# Patient Record
Sex: Female | Born: 1950 | Race: Black or African American | Hispanic: No | Marital: Married | Smoking: Never smoker
Health system: Southern US, Community
[De-identification: ages and names within clinical notes are randomized; demographics above are authoritative.]

## PROBLEM LIST (undated history)

## (undated) DIAGNOSIS — E669 Obesity, unspecified: Secondary | ICD-10-CM

## (undated) DIAGNOSIS — I10 Essential (primary) hypertension: Secondary | ICD-10-CM

## (undated) DIAGNOSIS — R928 Other abnormal and inconclusive findings on diagnostic imaging of breast: Secondary | ICD-10-CM

## (undated) DIAGNOSIS — R202 Paresthesia of skin: Secondary | ICD-10-CM

## (undated) DIAGNOSIS — Z9889 Other specified postprocedural states: Secondary | ICD-10-CM

## (undated) DIAGNOSIS — E559 Vitamin D deficiency, unspecified: Secondary | ICD-10-CM

## (undated) DIAGNOSIS — M25512 Pain in left shoulder: Secondary | ICD-10-CM

## (undated) DIAGNOSIS — R768 Other specified abnormal immunological findings in serum: Secondary | ICD-10-CM

## (undated) DIAGNOSIS — E785 Hyperlipidemia, unspecified: Secondary | ICD-10-CM

## (undated) DIAGNOSIS — T753XXA Motion sickness, initial encounter: Secondary | ICD-10-CM

## (undated) DIAGNOSIS — N182 Chronic kidney disease, stage 2 (mild): Secondary | ICD-10-CM

## (undated) DIAGNOSIS — Z5181 Encounter for therapeutic drug level monitoring: Secondary | ICD-10-CM

## (undated) DIAGNOSIS — Z0000 Encounter for general adult medical examination without abnormal findings: Secondary | ICD-10-CM

## (undated) DIAGNOSIS — G8929 Other chronic pain: Secondary | ICD-10-CM

## (undated) DIAGNOSIS — C50919 Malignant neoplasm of unspecified site of unspecified female breast: Secondary | ICD-10-CM

## (undated) DIAGNOSIS — M2550 Pain in unspecified joint: Secondary | ICD-10-CM

## (undated) DIAGNOSIS — Z833 Family history of diabetes mellitus: Secondary | ICD-10-CM

## (undated) DIAGNOSIS — Z923 Personal history of irradiation: Secondary | ICD-10-CM

## (undated) DIAGNOSIS — D0510 Intraductal carcinoma in situ of unspecified breast: Secondary | ICD-10-CM

## (undated) DIAGNOSIS — R7303 Prediabetes: Secondary | ICD-10-CM

## (undated) DIAGNOSIS — M1990 Unspecified osteoarthritis, unspecified site: Secondary | ICD-10-CM

## (undated) DIAGNOSIS — R200 Anesthesia of skin: Secondary | ICD-10-CM

## (undated) DIAGNOSIS — Z1239 Encounter for other screening for malignant neoplasm of breast: Secondary | ICD-10-CM

## (undated) DIAGNOSIS — R112 Nausea with vomiting, unspecified: Secondary | ICD-10-CM

## (undated) HISTORY — DX: Prediabetes: R73.03

## (undated) HISTORY — DX: Malignant neoplasm of unspecified site of unspecified female breast: C50.919

## (undated) HISTORY — DX: Hyperlipidemia, unspecified: E78.5

## (undated) HISTORY — DX: Encounter for general adult medical examination without abnormal findings: Z00.00

## (undated) HISTORY — DX: Anesthesia of skin: R20.0

## (undated) HISTORY — DX: Other chronic pain: G89.29

## (undated) HISTORY — DX: Chronic kidney disease, stage 2 (mild): N18.2

## (undated) HISTORY — DX: Pain in left shoulder: M25.512

## (undated) HISTORY — DX: Other specified abnormal immunological findings in serum: R76.8

## (undated) HISTORY — PX: ABDOMINAL HYSTERECTOMY: SHX81

## (undated) HISTORY — DX: Paresthesia of skin: R20.2

## (undated) HISTORY — DX: Encounter for therapeutic drug level monitoring: Z51.81

## (undated) HISTORY — DX: Other abnormal and inconclusive findings on diagnostic imaging of breast: R92.8

## (undated) HISTORY — DX: Encounter for other screening for malignant neoplasm of breast: Z12.39

## (undated) HISTORY — DX: Intraductal carcinoma in situ of unspecified breast: D05.10

## (undated) HISTORY — DX: Essential (primary) hypertension: I10

## (undated) HISTORY — DX: Pain in unspecified joint: M25.50

## (undated) HISTORY — DX: Obesity, unspecified: E66.9

## (undated) HISTORY — DX: Other specified postprocedural states: Z98.890

## (undated) HISTORY — DX: Family history of diabetes mellitus: Z83.3

## (undated) HISTORY — DX: Vitamin D deficiency, unspecified: E55.9

---

## 2006-12-07 DIAGNOSIS — I10 Essential (primary) hypertension: Secondary | ICD-10-CM

## 2006-12-07 HISTORY — DX: Essential (primary) hypertension: I10

## 2007-02-16 ENCOUNTER — Ambulatory Visit: Payer: Self-pay | Admitting: Gastroenterology

## 2009-06-27 ENCOUNTER — Observation Stay: Payer: Self-pay | Admitting: Internal Medicine

## 2010-04-23 ENCOUNTER — Ambulatory Visit: Payer: Self-pay | Admitting: Family Medicine

## 2010-05-01 ENCOUNTER — Ambulatory Visit: Payer: Self-pay | Admitting: Family Medicine

## 2012-09-01 ENCOUNTER — Ambulatory Visit: Payer: Self-pay | Admitting: Family Medicine

## 2013-03-03 ENCOUNTER — Ambulatory Visit: Payer: Self-pay | Admitting: Family Medicine

## 2013-08-29 ENCOUNTER — Ambulatory Visit: Payer: Self-pay | Admitting: Family Medicine

## 2013-09-02 ENCOUNTER — Ambulatory Visit: Payer: Self-pay | Admitting: Family Medicine

## 2013-10-11 ENCOUNTER — Ambulatory Visit: Payer: Self-pay | Admitting: Family Medicine

## 2014-08-15 DIAGNOSIS — I10 Essential (primary) hypertension: Secondary | ICD-10-CM | POA: Diagnosis not present

## 2014-08-15 DIAGNOSIS — M25512 Pain in left shoulder: Secondary | ICD-10-CM | POA: Diagnosis not present

## 2014-08-28 DIAGNOSIS — M898X1 Other specified disorders of bone, shoulder: Secondary | ICD-10-CM | POA: Diagnosis not present

## 2014-08-28 DIAGNOSIS — R202 Paresthesia of skin: Secondary | ICD-10-CM | POA: Diagnosis not present

## 2014-11-07 DIAGNOSIS — R200 Anesthesia of skin: Secondary | ICD-10-CM

## 2014-11-07 DIAGNOSIS — G8929 Other chronic pain: Secondary | ICD-10-CM | POA: Diagnosis not present

## 2014-11-07 DIAGNOSIS — M25512 Pain in left shoulder: Secondary | ICD-10-CM | POA: Diagnosis not present

## 2014-11-07 DIAGNOSIS — R202 Paresthesia of skin: Secondary | ICD-10-CM

## 2014-11-07 HISTORY — DX: Paresthesia of skin: R20.2

## 2014-11-07 HISTORY — DX: Anesthesia of skin: R20.0

## 2014-11-08 DIAGNOSIS — G8929 Other chronic pain: Secondary | ICD-10-CM

## 2014-11-08 DIAGNOSIS — M25512 Pain in left shoulder: Secondary | ICD-10-CM

## 2014-11-08 DIAGNOSIS — M25519 Pain in unspecified shoulder: Secondary | ICD-10-CM | POA: Insufficient documentation

## 2014-11-08 HISTORY — DX: Other chronic pain: G89.29

## 2014-11-08 HISTORY — DX: Pain in left shoulder: M25.512

## 2014-11-22 DIAGNOSIS — R202 Paresthesia of skin: Secondary | ICD-10-CM | POA: Diagnosis not present

## 2014-11-22 DIAGNOSIS — M25512 Pain in left shoulder: Secondary | ICD-10-CM | POA: Diagnosis not present

## 2014-11-22 DIAGNOSIS — G8929 Other chronic pain: Secondary | ICD-10-CM | POA: Diagnosis not present

## 2015-02-19 ENCOUNTER — Ambulatory Visit (INDEPENDENT_AMBULATORY_CARE_PROVIDER_SITE_OTHER): Payer: Commercial Managed Care - HMO | Admitting: Family Medicine

## 2015-02-19 ENCOUNTER — Encounter: Payer: Self-pay | Admitting: Family Medicine

## 2015-02-19 VITALS — BP 148/86 | HR 85 | Temp 98.1°F | Resp 16 | Ht 62.0 in | Wt 192.8 lb

## 2015-02-19 DIAGNOSIS — T300 Burn of unspecified body region, unspecified degree: Secondary | ICD-10-CM | POA: Diagnosis not present

## 2015-02-19 DIAGNOSIS — I10 Essential (primary) hypertension: Secondary | ICD-10-CM

## 2015-02-19 DIAGNOSIS — M25511 Pain in right shoulder: Secondary | ICD-10-CM

## 2015-02-19 MED ORDER — MELOXICAM 15 MG PO TABS: 15.0000 mg | ORAL_TABLET | Freq: Every day | ORAL | Status: DC

## 2015-02-19 MED ORDER — SILVER SULFADIAZINE 1 % EX CREA: 1.0000 "application " | TOPICAL_CREAM | Freq: Every day | CUTANEOUS | Status: DC

## 2015-02-19 MED ORDER — LOSARTAN POTASSIUM-HCTZ 100-25 MG PO TABS: 1.0000 | ORAL_TABLET | Freq: Every day | ORAL | Status: DC

## 2015-02-19 NOTE — Progress Notes (Signed)
Name: Marissa Nelson   MRN: [id number]    DOB: 06-23-1950   Date:02/19/2015       Progress Note  Subjective  Chief Complaint  Chief Complaint  Patient presents with  . Hypertension    6 month follow up  . Burn    left arm from work    HPI  Hypertension   Patient presents for follow-up of hypertension. It has been present for over  5 years.  Patient states that there is compliance with medical regimen which consists of losartan HCT 100-25 once daily . There is no end organ disease. Cardiac risk factors include hypertension hyperlipidemia and diabetes.  Exercise regimen consist of working out at [employer] .  Diet consist of low sodium .  Burn  Patient suffered a burn of her left arm while at work today.  Right shoulder pain  Patient complains of pain and discomfort in the right shoulder. No history of any antecedent problem trauma. She does repetitive motions at work was done so with arm and shoulder for number of years.  Past Medical History  Diagnosis Date  . Hyperlipidemia   . Hypertension     Social History  Substance Use Topics  . Smoking status: Never Smoker   . Smokeless tobacco: Not on file  . Alcohol Use: No     Current outpatient prescriptions:  .  losartan-hydrochlorothiazide (HYZAAR) 100-25 MG tablet, Take 1 tablet by mouth daily., Disp: , Rfl:   Allergies  Allergen Reactions  . Penicillin G Rash    Review of Systems  Constitutional: Negative for fever, chills and weight loss.  HENT: Negative for congestion, hearing loss, sore throat and tinnitus.   Eyes: Negative for blurred vision, double vision and redness.  Respiratory: Negative for cough, hemoptysis and shortness of breath.   Cardiovascular: Negative for chest pain, palpitations, orthopnea, claudication and leg swelling.  Gastrointestinal: Negative for heartburn, nausea, vomiting, diarrhea, constipation and blood in stool.  Genitourinary: Negative for dysuria, urgency, frequency and  hematuria.  Musculoskeletal: Positive for joint pain (right shoulder). Negative for myalgias, back pain, falls and neck pain.  Skin: Negative for itching.  Neurological: Negative for dizziness, tingling, tremors, focal weakness, seizures, loss of consciousness, weakness and headaches.  Endo/Heme/Allergies: Does not bruise/bleed easily.  Psychiatric/Behavioral: Negative for depression and substance abuse. The patient is not nervous/anxious and does not have insomnia.      Objective  Filed Vitals:   02/19/15 1455  BP: 148/86  Pulse: 85  Temp: 98.1 F (36.7 C)  TempSrc: Oral  Resp: 16  Height: 5\' 2"  (1.575 m)  Weight: 192 lb 12.8 oz (87.454 kg)  SpO2: 99%     Physical Exam  Constitutional: She is oriented to person, place, and time.  Obese in no acute distress  HENT:  Head: Normocephalic.  Eyes: EOM are normal. Pupils are equal, round, and reactive to light.  Neck: Normal range of motion. No thyromegaly present.  Cardiovascular: Normal rate, regular rhythm and normal heart sounds.   No murmur heard. Pulmonary/Chest: Effort normal and breath sounds normal.  Abdominal: Soft. Bowel sounds are normal.  Musculoskeletal: She exhibits no edema.  Tenderness to palpation over the right shoulder. Abduction is limited to about 80 before discomfort  Neurological: She is alert and oriented to person, place, and time. No cranial nerve deficit. Gait normal.  Skin: No rash noted.  There is a second-degree burn on the left forearm measuring about 1 cm x 6 cm  Psychiatric: Memory and affect  normal.      Assessment & Plan  1. Burn Second-degree - silver sulfADIAZINE (SILVADENE) 1 % cream; Apply 1 application topically daily.  Dispense: 50 g; Refill: 0  2. Right shoulder pain Probable mild rotator cuff syndrome. She is encouraged to do all TO increase range of motion. Consider x-ray and orthopedic evaluation - meloxicam (MOBIC) 15 MG tablet; Take 1 tablet (15 mg total) by mouth daily.   Dispense: 30 tablet; Refill: 0  3. Essential hypertension At goal below 150/90 - Comprehensive Metabolic Panel (CMET) - Lipid panel - TSH

## 2015-02-21 DIAGNOSIS — I10 Essential (primary) hypertension: Secondary | ICD-10-CM | POA: Diagnosis not present

## 2015-02-22 LAB — LIPID PANEL
CHOLESTEROL TOTAL: 214 mg/dL — AB (ref 100–199)
Chol/HDL Ratio: 3.1 ratio units (ref 0.0–4.4)
HDL: 69 mg/dL (ref >39)
LDL Calculated: 132 mg/dL — ABNORMAL HIGH (ref 0–99)
Triglycerides: 67 mg/dL (ref 0–149)
VLDL Cholesterol Cal: 13 mg/dL (ref 5–40)

## 2015-02-22 LAB — COMPREHENSIVE METABOLIC PANEL
A/G RATIO: 1.5 (ref 1.1–2.5)
ALK PHOS: 97 IU/L (ref 39–117)
ALT: 16 IU/L (ref 0–32)
AST: 17 IU/L (ref 0–40)
Albumin: 4.3 g/dL (ref 3.6–4.8)
BUN / CREAT RATIO: 16 (ref 11–26)
BUN: 16 mg/dL (ref 8–27)
Bilirubin Total: 0.3 mg/dL (ref 0.0–1.2)
CO2: 28 mmol/L (ref 18–29)
CREATININE: 0.99 mg/dL (ref 0.57–1.00)
Calcium: 9.9 mg/dL (ref 8.7–10.3)
Chloride: 98 mmol/L (ref 97–106)
GFR calc Af Amer: 69 mL/min/{1.73_m2} (ref >59)
GFR calc non Af Amer: 60 mL/min/{1.73_m2} (ref >59)
GLOBULIN, TOTAL: 2.9 g/dL (ref 1.5–4.5)
Glucose: 89 mg/dL (ref 65–99)
POTASSIUM: 4.9 mmol/L (ref 3.5–5.2)
SODIUM: 140 mmol/L (ref 136–144)
Total Protein: 7.2 g/dL (ref 6.0–8.5)

## 2015-02-22 LAB — TSH: TSH: 2 u[IU]/mL (ref 0.450–4.500)

## 2015-02-27 ENCOUNTER — Telehealth: Payer: Self-pay | Admitting: Emergency Medicine

## 2015-02-27 NOTE — Telephone Encounter (Signed)
Patient notified of labs.   

## 2015-06-11 DIAGNOSIS — J069 Acute upper respiratory infection, unspecified: Secondary | ICD-10-CM | POA: Diagnosis not present

## 2015-06-11 DIAGNOSIS — J208 Acute bronchitis due to other specified organisms: Secondary | ICD-10-CM | POA: Diagnosis not present

## 2015-06-15 ENCOUNTER — Ambulatory Visit (INDEPENDENT_AMBULATORY_CARE_PROVIDER_SITE_OTHER): Payer: Commercial Managed Care - HMO | Admitting: Family Medicine

## 2015-06-15 ENCOUNTER — Encounter: Payer: Self-pay | Admitting: Family Medicine

## 2015-06-15 ENCOUNTER — Encounter: Payer: Commercial Managed Care - HMO | Admitting: Family Medicine

## 2015-06-15 VITALS — BP 122/74 | HR 101 | Temp 98.3°F | Resp 18 | Wt 191.9 lb

## 2015-06-15 DIAGNOSIS — Z0000 Encounter for general adult medical examination without abnormal findings: Secondary | ICD-10-CM | POA: Insufficient documentation

## 2015-06-15 DIAGNOSIS — J40 Bronchitis, not specified as acute or chronic: Secondary | ICD-10-CM | POA: Insufficient documentation

## 2015-06-15 DIAGNOSIS — E669 Obesity, unspecified: Secondary | ICD-10-CM | POA: Insufficient documentation

## 2015-06-15 DIAGNOSIS — R42 Dizziness and giddiness: Secondary | ICD-10-CM | POA: Insufficient documentation

## 2015-06-15 DIAGNOSIS — E66812 Obesity, class 2: Secondary | ICD-10-CM | POA: Insufficient documentation

## 2015-06-15 DIAGNOSIS — IMO0002 Reserved for concepts with insufficient information to code with codable children: Secondary | ICD-10-CM | POA: Insufficient documentation

## 2015-06-15 DIAGNOSIS — E785 Hyperlipidemia, unspecified: Secondary | ICD-10-CM | POA: Insufficient documentation

## 2015-06-15 DIAGNOSIS — R0989 Other specified symptoms and signs involving the circulatory and respiratory systems: Secondary | ICD-10-CM | POA: Insufficient documentation

## 2015-06-15 DIAGNOSIS — M79673 Pain in unspecified foot: Secondary | ICD-10-CM | POA: Insufficient documentation

## 2015-06-15 DIAGNOSIS — H6120 Impacted cerumen, unspecified ear: Secondary | ICD-10-CM | POA: Insufficient documentation

## 2015-06-15 DIAGNOSIS — G4733 Obstructive sleep apnea (adult) (pediatric): Secondary | ICD-10-CM | POA: Insufficient documentation

## 2015-06-15 HISTORY — DX: Hyperlipidemia, unspecified: E78.5

## 2015-06-15 MED ORDER — PREDNISONE 20 MG PO TABS: 20.0000 mg | ORAL_TABLET | Freq: Two times a day (BID) | ORAL | Status: DC

## 2015-06-15 MED ORDER — BENZONATATE 100 MG PO CAPS: 100.0000 mg | ORAL_CAPSULE | Freq: Two times a day (BID) | ORAL | Status: DC | PRN

## 2015-06-15 MED ORDER — FLUTICASONE FUROATE-VILANTEROL 100-25 MCG/INH IN AEPB: 1.0000 | INHALATION_SPRAY | Freq: Every day | RESPIRATORY_TRACT | Status: DC

## 2015-06-15 NOTE — Progress Notes (Signed)
Name: Marissa Nelson   MRN: [id number]    DOB: June 15, 1950   Date:06/15/2015       Progress Note  Subjective  Chief Complaint  Chief Complaint  Patient presents with  . Bronchitis    started last sunday with a sore throat. patient was recently seen at [facility] and was given Doxycycline and a cough syrup that made her hallucinate.    HPI  Bronchitis: she states she developed cold symptoms two weeks ago, but over the past week symptoms are worse. She has dry cough, associated with wheezing and some SOB with activity. She has no fever or chills. She still has some clear rhinorrhea and nasal congestion. She has lack of appetite but has been able to eat soup and fluids. She went to Urgent Care and was given Doxycicline and cough syrup but got nauseated and hallucinations and stopped medications. She has missed work for the past few days.   Patient Active Problem List   Diagnosis Date Noted  . Bronchitis 06/15/2015  . Ceruminosis 06/15/2015  . Foot pain 06/15/2015  . Dizziness 06/15/2015  . HLD (hyperlipidemia) 06/15/2015  . Adult BMI 30+ 06/15/2015  . Obstructive apnea 06/15/2015  . Benign essential HTN 12/07/2006    Past Surgical History  Procedure Laterality Date  . No past surgeries      History reviewed. No pertinent family history.  Social History   Social History  . Marital Status: Married    Spouse Name: N/A  . Number of Children: N/A  . Years of Education: N/A   Occupational History  . Not on file.   Social History Main Topics  . Smoking status: Never Smoker   . Smokeless tobacco: Not on file  . Alcohol Use: No  . Drug Use: No  . Sexual Activity: No   Other Topics Concern  . Not on file   Social History Narrative     Current outpatient prescriptions:  .  aspirin EC 81 MG tablet, Take 81 mg by mouth daily., Disp: , Rfl:  .  doxycycline (VIBRA-TABS) 100 MG tablet, Take by mouth., Disp: , Rfl:  .  losartan-hydrochlorothiazide (HYZAAR) 100-25 MG  tablet, Take 1 tablet by mouth daily., Disp: 30 tablet, Rfl: 5 .  benzonatate (TESSALON) 100 MG capsule, Take 1-2 capsules (100-200 mg total) by mouth 2 (two) times daily as needed., Disp: 40 capsule, Rfl: 0 .  Fluticasone Furoate-Vilanterol (BREO ELLIPTA) 100-25 MCG/INH AEPB, Inhale 1 puff into the lungs daily., Disp: 60 each, Rfl: 0 .  predniSONE (DELTASONE) 20 MG tablet, Take 1 tablet (20 mg total) by mouth 2 (two) times daily with a meal., Disp: 10 tablet, Rfl: 0  Allergies  Allergen Reactions  . Penicillin G Rash     ROS  Ten systems reviewed and is negative except as mentioned in HPI   Objective  Filed Vitals:   06/15/15 0934  BP: 122/74  Pulse: 101  Temp: 98.3 F (36.8 C)  TempSrc: Oral  Resp: 18  Weight: 191 lb 14.4 oz (87.045 kg)  SpO2: 99%    Body mass index is 35.09 kg/(m^2).  Physical Exam  Constitutional: Patient appears well-developed and well-nourished. ObeseNo distress.  HEENT: head atraumatic, normocephalic, pupils equal and reactive to light, ears TM normal, neck supple, throat within normal limits Cardiovascular: Normal rate, regular rhythm and normal heart sounds.  No murmur heard. No BLE edema. Pulmonary/Chest: Effort normal and breath sounds normal. No respiratory distress. Abdominal: Soft.  There is no tenderness. Psychiatric: Patient  has a normal mood and affect. behavior is normal. Judgment and thought content normal.   PHQ2/9: Depression screen [facility] 2/9 06/15/2015 02/19/2015  Decreased Interest 0 0  Down, Depressed, Hopeless 0 0  PHQ - 2 Score 0 0     Fall Risk: Fall Risk  06/15/2015 02/19/2015  Falls in the past year? No No     Functional Status Survey: Is the patient deaf or have difficulty hearing?: No Does the patient have difficulty seeing, even when wearing glasses/contacts?: No Does the patient have difficulty concentrating, remembering, or making decisions?: No Does the patient have difficulty walking or climbing stairs?:  No Does the patient have difficulty dressing or bathing?: No Does the patient have difficulty doing errands alone such as visiting a doctor's office or shopping?: No    Assessment & Plan  1. Bronchitis  May stop Doxycicline and try prednisone , benzonate , Breo and continue fluids.  - predniSONE (DELTASONE) 20 MG tablet; Take 1 tablet (20 mg total) by mouth 2 (two) times daily with a meal.  Dispense: 10 tablet; Refill: 0 - Fluticasone Furoate-Vilanterol (BREO ELLIPTA) 100-25 MCG/INH AEPB; Inhale 1 puff into the lungs daily.  Dispense: 60 each; Refill: 0 - benzonatate (TESSALON) 100 MG capsule; Take 1-2 capsules (100-200 mg total) by mouth 2 (two) times daily as needed.  Dispense: 40 capsule; Refill: 0

## 2015-07-23 ENCOUNTER — Encounter: Payer: Commercial Managed Care - HMO | Admitting: Family Medicine

## 2015-09-03 ENCOUNTER — Encounter: Payer: Commercial Managed Care - HMO | Admitting: Family Medicine

## 2015-10-16 ENCOUNTER — Encounter: Payer: Commercial Managed Care - HMO | Admitting: Family Medicine

## 2015-10-25 ENCOUNTER — Encounter: Payer: Self-pay | Admitting: Family Medicine

## 2015-10-25 ENCOUNTER — Ambulatory Visit (INDEPENDENT_AMBULATORY_CARE_PROVIDER_SITE_OTHER): Payer: Commercial Managed Care - HMO | Admitting: Family Medicine

## 2015-10-25 VITALS — BP 122/84 | HR 82 | Temp 98.4°F | Resp 18 | Wt 194.4 lb

## 2015-10-25 DIAGNOSIS — E669 Obesity, unspecified: Secondary | ICD-10-CM | POA: Diagnosis not present

## 2015-10-25 DIAGNOSIS — E559 Vitamin D deficiency, unspecified: Secondary | ICD-10-CM

## 2015-10-25 DIAGNOSIS — E66811 Obesity, class 1: Secondary | ICD-10-CM

## 2015-10-25 DIAGNOSIS — I10 Essential (primary) hypertension: Secondary | ICD-10-CM | POA: Diagnosis not present

## 2015-10-25 HISTORY — DX: Obesity, unspecified: E66.9

## 2015-10-25 HISTORY — DX: Obesity, class 1: E66.811

## 2015-10-25 LAB — BASIC METABOLIC PANEL
BUN: 15 mg/dL (ref 7–25)
CO2: 28 mmol/L (ref 20–31)
Calcium: 9.6 mg/dL (ref 8.6–10.4)
Chloride: 105 mmol/L (ref 98–110)
Creat: 1.17 mg/dL — ABNORMAL HIGH (ref 0.50–0.99)
Glucose, Bld: 86 mg/dL (ref 65–99)
POTASSIUM: 4.9 mmol/L (ref 3.5–5.3)
SODIUM: 139 mmol/L (ref 135–146)

## 2015-10-25 MED ORDER — LOSARTAN POTASSIUM-HCTZ 100-25 MG PO TABS: 1.0000 | ORAL_TABLET | Freq: Every day | ORAL | Status: DC

## 2015-10-26 LAB — VITAMIN D 25 HYDROXY (VIT D DEFICIENCY, FRACTURES): VIT D 25 HYDROXY: 11 ng/mL — AB (ref 30–100)

## 2015-10-27 NOTE — Progress Notes (Addendum)
Date:  10/25/2015   Name:  Marissa Nelson   DOB:  1951/02/13   MRN:  [id number]  PCP:  Ashok Norris, MD    Chief Complaint: Medication Refill   History of Present Illness:  This is a 65 y.o. female seen for four month f/u. On Hyzaar for HTN and asa for stroke risk. Bronchitis sxs from Feb visit have resolved. No acute concerns.  Review of Systems:  Review of Systems  Constitutional: Negative for fever and fatigue.  Respiratory: Negative for cough and shortness of breath.   Cardiovascular: Negative for chest pain and leg swelling.  Neurological: Negative for syncope and light-headedness.    Patient Active Problem List   Diagnosis Date Noted  . Obesity, Class II, BMI 35-39.9 10/25/2015  . Bronchitis 06/15/2015  . Ceruminosis 06/15/2015  . Foot pain 06/15/2015  . Dizziness 06/15/2015  . HLD (hyperlipidemia) 06/15/2015  . Adult BMI 30+ 06/15/2015  . Obstructive apnea 06/15/2015  . Benign essential HTN 12/07/2006    Prior to Admission medications   Medication Sig Start Date End Date Taking? Authorizing Provider  aspirin EC 81 MG tablet Take 81 mg by mouth daily.    Historical Provider, MD  losartan-hydrochlorothiazide (HYZAAR) 100-25 MG tablet Take 1 tablet by mouth daily. 10/25/15   Adline Potter, MD    Allergies  Allergen Reactions  . Penicillin G Rash    Past Surgical History  Procedure Laterality Date  . No past surgeries      Social History  Substance Use Topics  . Smoking status: Never Smoker   . Smokeless tobacco: None  . Alcohol Use: No    No family history on file.  Medication list has been reviewed and updated.  Physical Examination: BP 122/84 mmHg  Pulse 82  Temp(Src) 98.4 F (36.9 C)  Resp 18  Wt 194 lb 7 oz (88.196 kg)  SpO2 97%  Physical Exam  Constitutional: She appears well-developed and well-nourished.  Cardiovascular: Normal rate, regular rhythm and normal heart sounds.   Pulmonary/Chest: Effort normal and breath sounds  normal.  Musculoskeletal: She exhibits no edema.  Neurological: She is alert.  Skin: Skin is warm and dry.  Psychiatric: She has a normal mood and affect. Her behavior is normal.  Nursing note and vitals reviewed.   Assessment and Plan:  1. Benign essential HTN Well controlled on Hyzaar - Basic Metabolic Panel (BMET)  2. Obesity, Class II, BMI 35-39.9 Exercise/weight loss discussed - Vitamin D (25 hydroxy)  3. Med review Unclear indication for asa use (10 yr CVR 7.2%), consider d/c next visit  Return in about 6 months (around 04/25/2016).  Satira Anis. [facility]  10/27/2015

## 2015-10-29 DIAGNOSIS — E559 Vitamin D deficiency, unspecified: Secondary | ICD-10-CM

## 2015-10-29 HISTORY — DX: Vitamin D deficiency, unspecified: E55.9

## 2015-10-29 MED ORDER — VITAMIN D 50 MCG (2000 UT) PO CAPS: 1.0000 | ORAL_CAPSULE | Freq: Every day | ORAL | Status: DC

## 2015-10-29 NOTE — Addendum Note (Signed)
Addended by: Adline Potter on: 10/29/2015 08:21 AM   Modules accepted: Orders, Medications

## 2015-11-15 ENCOUNTER — Ambulatory Visit (INDEPENDENT_AMBULATORY_CARE_PROVIDER_SITE_OTHER): Payer: BC Managed Care – PPO | Admitting: Family Medicine

## 2015-11-15 ENCOUNTER — Encounter: Payer: Self-pay | Admitting: Family Medicine

## 2015-11-15 VITALS — BP 120/84 | HR 68 | Temp 98.8°F | Resp 18 | Ht 62.0 in | Wt 196.4 lb

## 2015-11-15 DIAGNOSIS — E559 Vitamin D deficiency, unspecified: Secondary | ICD-10-CM | POA: Diagnosis not present

## 2015-11-15 DIAGNOSIS — Z5181 Encounter for therapeutic drug level monitoring: Secondary | ICD-10-CM | POA: Diagnosis not present

## 2015-11-15 DIAGNOSIS — N182 Chronic kidney disease, stage 2 (mild): Secondary | ICD-10-CM

## 2015-11-15 DIAGNOSIS — N183 Chronic kidney disease, stage 3 unspecified: Secondary | ICD-10-CM

## 2015-11-15 DIAGNOSIS — Z1239 Encounter for other screening for malignant neoplasm of breast: Secondary | ICD-10-CM | POA: Insufficient documentation

## 2015-11-15 DIAGNOSIS — N1832 Chronic kidney disease, stage 3b: Secondary | ICD-10-CM | POA: Insufficient documentation

## 2015-11-15 DIAGNOSIS — E785 Hyperlipidemia, unspecified: Secondary | ICD-10-CM | POA: Diagnosis not present

## 2015-11-15 DIAGNOSIS — Z0000 Encounter for general adult medical examination without abnormal findings: Secondary | ICD-10-CM | POA: Insufficient documentation

## 2015-11-15 DIAGNOSIS — Z23 Encounter for immunization: Secondary | ICD-10-CM | POA: Diagnosis not present

## 2015-11-15 DIAGNOSIS — Z1159 Encounter for screening for other viral diseases: Secondary | ICD-10-CM | POA: Diagnosis not present

## 2015-11-15 DIAGNOSIS — E669 Obesity, unspecified: Secondary | ICD-10-CM | POA: Diagnosis not present

## 2015-11-15 DIAGNOSIS — Z780 Asymptomatic menopausal state: Secondary | ICD-10-CM | POA: Diagnosis not present

## 2015-11-15 HISTORY — DX: Chronic kidney disease, stage 2 (mild): N18.2

## 2015-11-15 HISTORY — DX: Encounter for therapeutic drug level monitoring: Z51.81

## 2015-11-15 HISTORY — DX: Encounter for general adult medical examination without abnormal findings: Z00.00

## 2015-11-15 HISTORY — DX: Chronic kidney disease, stage 3 unspecified: N18.30

## 2015-11-15 HISTORY — DX: Encounter for other screening for malignant neoplasm of breast: Z12.39

## 2015-11-15 LAB — COMPLETE METABOLIC PANEL WITH GFR
ALBUMIN: 4.1 g/dL (ref 3.6–5.1)
ALK PHOS: 88 U/L (ref 33–130)
ALT: 20 U/L (ref 6–29)
AST: 16 U/L (ref 10–35)
BILIRUBIN TOTAL: 0.3 mg/dL (ref 0.2–1.2)
BUN: 14 mg/dL (ref 7–25)
CALCIUM: 9.7 mg/dL (ref 8.6–10.4)
CO2: 29 mmol/L (ref 20–31)
Chloride: 103 mmol/L (ref 98–110)
Creat: 1.09 mg/dL — ABNORMAL HIGH (ref 0.50–0.99)
GFR, EST NON AFRICAN AMERICAN: 53 mL/min — AB (ref >=60)
GFR, Est African American: 61 mL/min (ref >=60)
GLUCOSE: 98 mg/dL (ref 65–99)
Potassium: 4.8 mmol/L (ref 3.5–5.3)
SODIUM: 140 mmol/L (ref 135–146)
TOTAL PROTEIN: 7 g/dL (ref 6.1–8.1)

## 2015-11-15 LAB — LIPID PANEL
CHOLESTEROL: 194 mg/dL (ref 125–200)
HDL: 64 mg/dL (ref >=46)
LDL Cholesterol: 113 mg/dL (ref <130)
Total CHOL/HDL Ratio: 3 Ratio (ref <=5.0)
Triglycerides: 86 mg/dL (ref <150)
VLDL: 17 mg/dL (ref <30)

## 2015-11-15 NOTE — Assessment & Plan Note (Signed)
Start Rx for just two months, then 2,000 iu daily OTC for the rest of the year, then 1,000 iu daily

## 2015-11-15 NOTE — Patient Instructions (Addendum)
Check out the information at familydoctor.org entitled "Nutrition for Weight Loss: What You Need to Know about Fad Diets" Try to lose between 1-2 pounds per week by taking in fewer calories and burning off more calories You can succeed by limiting portions, limiting foods dense in calories and fat, becoming more active, and drinking 8 glasses of water a day (64 ounces) Don't skip meals, especially breakfast, as skipping meals may alter your metabolism Do not use over-the-counter weight loss pills or gimmicks that claim rapid weight loss A healthy BMI (or body mass index) is between 18.5 and 24.9 You can calculate your ideal BMI at the NIH website ClubMonetize.fr  Take a prescription vitamin D for two months, then 2,000 iu daily for the rest of the year, then just 1,000 iu daily starting in 2018 to keep your levels adequate  Please do call to schedule your mammogram and bone density test; the number to schedule one at either [facility] or [facility] is [phone]  If you need something for aches or pains, try to use Tylenol (acetaminophen) instead of non-steroidals (which include Aleve, ibuprofen, Advil, Motrin, and naproxen); non-steroidals can cause long-term kidney damage  Try to limit saturated fats in your diet (bologna, hot dogs, barbeque, cheeseburgers, hamburgers, steak, bacon, sausage, cheese, etc.) and get more fresh fruits, vegetables, and whole grains  Health Maintenance, Female Adopting a healthy lifestyle and getting preventive care can go a long way to promote health and wellness. Talk with your health care provider about what schedule of regular examinations is right for you. This is a good chance for you to check in with your provider about disease prevention and staying healthy. In between checkups, there are plenty of things you can do on your own. Experts have done a lot of research about which  lifestyle changes and preventive measures are most likely to keep you healthy. Ask your health care provider for more information. WEIGHT AND DIET  Eat a healthy diet  Be sure to include plenty of vegetables, fruits, low-fat dairy products, and lean protein.  Do not eat a lot of foods high in solid fats, added sugars, or salt.  Get regular exercise. This is one of the most important things you can do for your health.  Most adults should exercise for at least 150 minutes each week. The exercise should increase your heart rate and make you sweat (moderate-intensity exercise).  Most adults should also do strengthening exercises at least twice a week. This is in addition to the moderate-intensity exercise.  Maintain a healthy weight  Body mass index (BMI) is a measurement that can be used to identify possible weight problems. It estimates body fat based on height and weight. Your health care provider can help determine your BMI and help you achieve or maintain a healthy weight.  For females 53 years of age and older:   A BMI below 18.5 is considered underweight.  A BMI of 18.5 to 24.9 is normal.  A BMI of 25 to 29.9 is considered overweight.  A BMI of 30 and above is considered obese.  Watch levels of cholesterol and blood lipids  You should start having your blood tested for lipids and cholesterol at 67 years of age, then have this test every 5 years.  You may need to have your cholesterol levels checked more often if:  Your lipid or cholesterol levels are high.  You are older than 67 years of age.  You are at high  risk for heart disease.  CANCER SCREENING   Lung Cancer  Lung cancer screening is recommended for adults 34-24 years old who are at high risk for lung cancer because of a history of smoking.  A yearly low-dose CT scan of the lungs is recommended for people who:  Currently smoke.  Have quit within the past 15 years.  Have at least a 30-pack-year history of  smoking. A pack year is smoking an average of one pack of cigarettes a day for 1 year.  Yearly screening should continue until it has been 15 years since you quit.  Yearly screening should stop if you develop a health problem that would prevent you from having lung cancer treatment.  Breast Cancer  Practice breast self-awareness. This means understanding how your breasts normally appear and feel.  It also means doing regular breast self-exams. Let your health care provider know about any changes, no matter how small.  If you are in your 20s or 30s, you should have a clinical breast exam (CBE) by a health care provider every 1-3 years as part of a regular health exam.  If you are 42 or older, have a CBE every year. Also consider having a breast X-ray (mammogram) every year.  If you have a family history of breast cancer, talk to your health care provider about genetic screening.  If you are at high risk for breast cancer, talk to your health care provider about having an MRI and a mammogram every year.  Breast cancer gene (BRCA) assessment is recommended for women who have family members with BRCA-related cancers. BRCA-related cancers include:  Breast.  Ovarian.  Tubal.  Peritoneal cancers.  Results of the assessment will determine the need for genetic counseling and BRCA1 and BRCA2 testing. Cervical Cancer Your health care provider may recommend that you be screened regularly for cancer of the pelvic organs (ovaries, uterus, and vagina). This screening involves a pelvic examination, including checking for microscopic changes to the surface of your cervix (Pap test). You may be encouraged to have this screening done every 3 years, beginning at age 42.  For women ages 63-65, health care providers may recommend pelvic exams and Pap testing every 3 years, or they may recommend the Pap and pelvic exam, combined with testing for human papilloma virus (HPV), every 5 years. Some types of  HPV increase your risk of cervical cancer. Testing for HPV may also be done on women of any age with unclear Pap test results.  Other health care providers may not recommend any screening for nonpregnant women who are considered low risk for pelvic cancer and who do not have symptoms. Ask your health care provider if a screening pelvic exam is right for you.  If you have had past treatment for cervical cancer or a condition that could lead to cancer, you need Pap tests and screening for cancer for at least 20 years after your treatment. If Pap tests have been discontinued, your risk factors (such as having a new sexual partner) need to be reassessed to determine if screening should resume. Some women have medical problems that increase the chance of getting cervical cancer. In these cases, your health care provider may recommend more frequent screening and Pap tests. Colorectal Cancer  This type of cancer can be detected and often prevented.  Routine colorectal cancer screening usually begins at 67 years of age and continues through 67 years of age.  Your health care provider may recommend screening at an earlier age  if you have risk factors for colon cancer.  Your health care provider may also recommend using home test kits to check for hidden blood in the stool.  A small camera at the end of a tube can be used to examine your colon directly (sigmoidoscopy or colonoscopy). This is done to check for the earliest forms of colorectal cancer.  Routine screening usually begins at age 61.  Direct examination of the colon should be repeated every 5-10 years through 67 years of age. However, you may need to be screened more often if early forms of precancerous polyps or small growths are found. Skin Cancer  Check your skin from head to toe regularly.  Tell your health care provider about any new moles or changes in moles, especially if there is a change in a mole's shape or color.  Also tell your  health care provider if you have a mole that is larger than the size of a pencil eraser.  Always use sunscreen. Apply sunscreen liberally and repeatedly throughout the day.  Protect yourself by wearing long sleeves, pants, a wide-brimmed hat, and sunglasses whenever you are outside. HEART DISEASE, DIABETES, AND HIGH BLOOD PRESSURE   High blood pressure causes heart disease and increases the risk of stroke. High blood pressure is more likely to develop in:  People who have blood pressure in the high end of the normal range (130-139/85-89 mm Hg).  People who are overweight or obese.  People who are African American.  If you are 82-65 years of age, have your blood pressure checked every 3-5 years. If you are 19 years of age or older, have your blood pressure checked every year. You should have your blood pressure measured twice--once when you are at a hospital or clinic, and once when you are not at a hospital or clinic. Record the average of the two measurements. To check your blood pressure when you are not at a hospital or clinic, you can use:  An automated blood pressure machine at a pharmacy.  A home blood pressure monitor.  If you are between 62 years and 6 years old, ask your health care provider if you should take aspirin to prevent strokes.  Have regular diabetes screenings. This involves taking a blood sample to check your fasting blood sugar level.  If you are at a normal weight and have a low risk for diabetes, have this test once every three years after 67 years of age.  If you are overweight and have a high risk for diabetes, consider being tested at a younger age or more often. PREVENTING INFECTION  Hepatitis B  If you have a higher risk for hepatitis B, you should be screened for this virus. You are considered at high risk for hepatitis B if:  You were born in a country where hepatitis B is common. Ask your health care provider which countries are considered high  risk.  Your parents were born in a high-risk country, and you have not been immunized against hepatitis B (hepatitis B vaccine).  You have HIV or AIDS.  You use needles to inject street drugs.  You live with someone who has hepatitis B.  You have had sex with someone who has hepatitis B.  You get hemodialysis treatment.  You take certain medicines for conditions, including cancer, organ transplantation, and autoimmune conditions. Hepatitis C  Blood testing is recommended for:  Everyone born from [birth year] through 1967.  Anyone with known risk factors for hepatitis C. Sexually transmitted  infections (STIs)  You should be screened for sexually transmitted infections (STIs) including gonorrhea and chlamydia if:  You are sexually active and are younger than 67 years of age.  You are older than 68 years of age and your health care provider tells you that you are at risk for this type of infection.  Your sexual activity has changed since you were last screened and you are at an increased risk for chlamydia or gonorrhea. Ask your health care provider if you are at risk.  If you do not have HIV, but are at risk, it may be recommended that you take a prescription medicine daily to prevent HIV infection. This is called pre-exposure prophylaxis (PrEP). You are considered at risk if:  You are sexually active and do not regularly use condoms or know the HIV status of your partner(s).  You take drugs by injection.  You are sexually active with a partner who has HIV. Talk with your health care provider about whether you are at high risk of being infected with HIV. If you choose to begin PrEP, you should first be tested for HIV. You should then be tested every 3 months for as long as you are taking PrEP.  PREGNANCY   If you are premenopausal and you may become pregnant, ask your health care provider about preconception counseling.  If you may become pregnant, take 400 to 800 micrograms (mcg)  of folic acid every day.  If you want to prevent pregnancy, talk to your health care provider about birth control (contraception). OSTEOPOROSIS AND MENOPAUSE   Osteoporosis is a disease in which the bones lose minerals and strength with aging. This can result in serious bone fractures. Your risk for osteoporosis can be identified using a bone density scan.  If you are 34 years of age or older, or if you are at risk for osteoporosis and fractures, ask your health care provider if you should be screened.  Ask your health care provider whether you should take a calcium or vitamin D supplement to lower your risk for osteoporosis.  Menopause may have certain physical symptoms and risks.  Hormone replacement therapy may reduce some of these symptoms and risks. Talk to your health care provider about whether hormone replacement therapy is right for you.  HOME CARE INSTRUCTIONS   Schedule regular health, dental, and eye exams.  Stay current with your immunizations.   Do not use any tobacco products including cigarettes, chewing tobacco, or electronic cigarettes.  If you are pregnant, do not drink alcohol.  If you are breastfeeding, limit how much and how often you drink alcohol.  Limit alcohol intake to no more than 1 drink per day for nonpregnant women. One drink equals 12 ounces of beer, 5 ounces of wine, or 1 ounces of hard liquor.  Do not use street drugs.  Do not share needles.  Ask your health care provider for help if you need support or information about quitting drugs.  Tell your health care provider if you often feel depressed.  Tell your health care provider if you have ever been abused or do not feel safe at home.   This information is not intended to replace advice given to you by your health care provider. Make sure you discuss any questions you have with your health care provider.   Document Released: 11/04/2010 Document Revised: 05/12/2014 Document Reviewed:  03/23/2013 Elsevier Interactive Patient Education [employer].

## 2015-11-15 NOTE — Assessment & Plan Note (Signed)
Due for PCV-13 today; she can get the PPSV-23 next year

## 2015-11-15 NOTE — Assessment & Plan Note (Signed)
Check today; limit saturated fats

## 2015-11-15 NOTE — Progress Notes (Signed)
Patient ID: Marissa Nelson, female   DOB: 11-15-1950, 65 y.o.   MRN: [id number]   Subjective:   Marissa Nelson is a 65 y.o. female here for a complete physical exam  Interim issues since last visit: none She did see Dr. Vicente Masson in June and had labs done; reviewed Mild bump in her creatinine; vit D level was only 11  USPSTF grade A and B recommendations Alcohol: no Depression:  Depression screen [facility] 2/9 11/15/2015 06/15/2015 02/19/2015  Decreased Interest 0 0 0  Down, Depressed, Hopeless 0 0 0  PHQ - 2 Score 0 0 0   Hypertension: controlled Obesity: weight has gone up Tobacco use: no HIV, hep B, hep C: check hep C today STD testing and prevention (chl/gon/syphilis): politely declined Lipids: check today Glucose: just checked in June and great Colorectal cancer: 2008; ten year pass, next due in 2018 Breast cancer: order Cervical cancer screening: s/p hyst for uterine cancer; followed by GYN; no recurrence Lung cancer: never Osteoporosis: order Fall prevention/vitamin D: start Rx AAA: no fam hx Aspirin: go back on Diet: room for improvement Exercise: try to become more active Skin cancer: nothing worrisome  Past Medical History  Diagnosis Date  . Hyperlipidemia   . Hypertension   . Obesity (BMI 30.0-34.9) 10/25/2015  . Chronic kidney disease, stage II (mild) 11/15/2015   Past Surgical History  Procedure Laterality Date  . No past surgeries     No family history on file. Social History  Substance Use Topics  . Smoking status: Never Smoker   . Smokeless tobacco: Not on file  . Alcohol Use: No   Review of Systems  Objective:   Filed Vitals:   11/15/15 0840  BP: 120/84  Pulse: 68  Temp: 98.8 F (37.1 C)  Resp: 18  Height: 5\' 2"  (1.575 m)  Weight: 196 lb 6 oz (89.075 kg)   Body mass index is 35.91 kg/(m^2). Wt Readings from Last 3 Encounters:  11/15/15 196 lb 6 oz (89.075 kg)  10/25/15 194 lb 7 oz (88.196 kg)  06/15/15 191 lb 14.4 oz (87.045 kg)    Physical Exam  Constitutional: She appears well-developed and well-nourished.  HENT:  Head: Normocephalic and atraumatic.  Right Ear: Hearing, tympanic membrane, external ear and ear canal normal.  Left Ear: Hearing, tympanic membrane, external ear and ear canal normal.  Eyes: Conjunctivae and EOM are normal. Right eye exhibits no hordeolum. Left eye exhibits no hordeolum. No scleral icterus.  Neck: Carotid bruit is not present. No thyromegaly present.  Cardiovascular: Normal rate, regular rhythm, S1 normal, S2 normal and normal heart sounds.   No extrasystoles are present.  Pulmonary/Chest: Effort normal and breath sounds normal. No respiratory distress. Right breast exhibits no inverted nipple, no mass, no nipple discharge, no skin change and no tenderness. Left breast exhibits no inverted nipple, no mass, no nipple discharge, no skin change and no tenderness. Breasts are symmetrical.  Abdominal: Soft. Normal appearance and bowel sounds are normal. She exhibits no distension, no abdominal bruit, no pulsatile midline mass and no mass. There is no hepatosplenomegaly. There is no tenderness. No hernia.  Musculoskeletal: Normal range of motion. She exhibits no edema.  Lymphadenopathy:       Head (right side): No submandibular adenopathy present.       Head (left side): No submandibular adenopathy present.    She has no cervical adenopathy.    She has no axillary adenopathy.  Neurological: She is alert. She displays no tremor. No cranial  nerve deficit. She exhibits normal muscle tone. Gait normal.  Reflex Scores:      Patellar reflexes are 2+ on the right side and 2+ on the left side. Skin: Skin is warm and dry. No bruising and no ecchymosis noted. No cyanosis. No pallor.  Psychiatric: Her speech is normal and behavior is normal. Thought content normal. Her mood appears not anxious. She does not exhibit a depressed mood.    Assessment/Plan:   Problem List Items Addressed This Visit       Genitourinary   Chronic kidney disease, stage II (mild)    I'd like her to restart her baby aspirin; I suspect decline in GFR more likely related to ibuprofen/aleve than low dose aspirin; I believe the benefit of baby aspirin outweighs risk for her; encouraged hydration; check creatinine today        Other   Vitamin D deficiency    Start Rx for just two months, then 2,000 iu daily OTC for the rest of the year, then 1,000 iu daily      Preventative health care - Primary    USPSTF grade A and B recommendations reviewed with patient; age-appropriate recommendations, preventive care, screening tests, etc discussed and encouraged; healthy living encouraged; see AVS for patient education given to patient      Pneumococcal vaccination given    Due for PCV-13 today; she can get the PPSV-23 next year      Relevant Orders   Pneumococcal conjugate vaccine 13-valent (Completed)   Obesity (BMI 30.0-34.9)    Encouraged modest weight loss, healthy eating, activity      Medication monitoring encounter   Relevant Orders   COMPLETE METABOLIC PANEL WITH GFR   HLD (hyperlipidemia)    Check today; limit saturated fats      Relevant Medications   aspirin EC 81 MG tablet   Other Relevant Orders   Lipid panel   Breast cancer screening   Relevant Orders   MM DIGITAL SCREENING BILATERAL    Other Visit Diagnoses    Postmenopausal status        order DEXA scan; replace vit D to help with calcium metabolism, integration into bone    Relevant Orders    DG Bone Density    Need for hepatitis C screening test        Relevant Orders    Hepatitis C Antibody        Meds ordered this encounter  Medications  . aspirin EC 81 MG tablet    Sig: Take 1 tablet (81 mg total) by mouth daily.   Orders Placed This Encounter  Procedures  . MM DIGITAL SCREENING BILATERAL    Order Specific Question:  Reason for Exam (SYMPTOM  OR DIAGNOSIS REQUIRED)    Answer:  screening    Order Specific Question:   Preferred imaging location?    Answer:  [facility]  . DG Bone Density    Order Specific Question:  Reason for Exam (SYMPTOM  OR DIAGNOSIS REQUIRED)    Answer:  postmenopausal status    Order Specific Question:  Preferred imaging location?    Answer:  [facility]  . Pneumococcal conjugate vaccine 13-valent  . COMPLETE METABOLIC PANEL WITH GFR  . Lipid panel    Order Specific Question:  Has the patient fasted?    Answer:  Yes  . Hepatitis C Antibody    Follow up plan: Return in about 1 year (around 11/14/2016) for complete physical.  An after-visit summary was printed and given  to the patient at [facility].  Please see the patient instructions which may contain other information and recommendations beyond what is mentioned above in the assessment and plan.

## 2015-11-15 NOTE — Assessment & Plan Note (Signed)
I'd like her to restart her baby aspirin; I suspect decline in GFR more likely related to ibuprofen/aleve than low dose aspirin; I believe the benefit of baby aspirin outweighs risk for her; encouraged hydration; check creatinine today

## 2015-11-15 NOTE — Assessment & Plan Note (Signed)
Encouraged modest weight loss, healthy eating, activity

## 2015-11-15 NOTE — Assessment & Plan Note (Signed)
USPSTF grade A and B recommendations reviewed with patient; age-appropriate recommendations, preventive care, screening tests, etc discussed and encouraged; healthy living encouraged; see AVS for patient education given to patient  

## 2015-11-16 LAB — HEPATITIS C ANTIBODY: HCV AB: NEGATIVE

## 2015-12-13 ENCOUNTER — Ambulatory Visit
Admission: RE | Admit: 2015-12-13 | Discharge: 2015-12-13 | Disposition: A | Discharge status: Home / Self Care | Payer: BC Managed Care – PPO | Source: Ambulatory Visit | Attending: Family Medicine | Admitting: Family Medicine

## 2015-12-13 DIAGNOSIS — Z1239 Encounter for other screening for malignant neoplasm of breast: Secondary | ICD-10-CM | POA: Diagnosis not present

## 2015-12-13 DIAGNOSIS — R921 Mammographic calcification found on diagnostic imaging of breast: Secondary | ICD-10-CM | POA: Insufficient documentation

## 2015-12-13 DIAGNOSIS — Z780 Asymptomatic menopausal state: Secondary | ICD-10-CM | POA: Insufficient documentation

## 2015-12-13 DIAGNOSIS — Z1231 Encounter for screening mammogram for malignant neoplasm of breast: Secondary | ICD-10-CM | POA: Diagnosis not present

## 2015-12-14 ENCOUNTER — Other Ambulatory Visit: Payer: Self-pay | Admitting: Family Medicine

## 2015-12-14 DIAGNOSIS — R921 Mammographic calcification found on diagnostic imaging of breast: Secondary | ICD-10-CM

## 2016-01-02 ENCOUNTER — Ambulatory Visit
Admission: RE | Admit: 2016-01-02 | Discharge: 2016-01-02 | Disposition: A | Discharge status: Home / Self Care | Payer: Commercial Managed Care - HMO | Source: Ambulatory Visit | Attending: Family Medicine | Admitting: Family Medicine

## 2016-01-02 ENCOUNTER — Other Ambulatory Visit: Payer: Self-pay | Admitting: Family Medicine

## 2016-01-02 DIAGNOSIS — R921 Mammographic calcification found on diagnostic imaging of breast: Secondary | ICD-10-CM | POA: Diagnosis not present

## 2016-01-02 DIAGNOSIS — R928 Other abnormal and inconclusive findings on diagnostic imaging of breast: Secondary | ICD-10-CM | POA: Insufficient documentation

## 2016-01-03 ENCOUNTER — Other Ambulatory Visit: Payer: Self-pay | Admitting: Family Medicine

## 2016-01-03 DIAGNOSIS — R921 Mammographic calcification found on diagnostic imaging of breast: Secondary | ICD-10-CM

## 2016-01-15 ENCOUNTER — Ambulatory Visit
Admission: RE | Admit: 2016-01-15 | Discharge: 2016-01-15 | Disposition: A | Discharge status: Home / Self Care | Payer: BC Managed Care – PPO | Source: Ambulatory Visit | Attending: Family Medicine | Admitting: Family Medicine

## 2016-01-15 DIAGNOSIS — R920 Mammographic microcalcification found on diagnostic imaging of breast: Secondary | ICD-10-CM | POA: Diagnosis not present

## 2016-01-15 DIAGNOSIS — R921 Mammographic calcification found on diagnostic imaging of breast: Secondary | ICD-10-CM

## 2016-01-15 DIAGNOSIS — N63 Unspecified lump in breast: Secondary | ICD-10-CM | POA: Diagnosis not present

## 2016-01-15 DIAGNOSIS — N6031 Fibrosclerosis of right breast: Secondary | ICD-10-CM | POA: Diagnosis not present

## 2016-01-15 HISTORY — PX: BREAST BIOPSY: SHX20

## 2016-01-16 LAB — SURGICAL PATHOLOGY

## 2016-01-17 ENCOUNTER — Encounter: Payer: Self-pay | Admitting: Family Medicine

## 2016-01-17 DIAGNOSIS — Z9889 Other specified postprocedural states: Secondary | ICD-10-CM

## 2016-01-17 DIAGNOSIS — R928 Other abnormal and inconclusive findings on diagnostic imaging of breast: Secondary | ICD-10-CM

## 2016-01-17 HISTORY — DX: Other abnormal and inconclusive findings on diagnostic imaging of breast: R92.8

## 2016-01-17 HISTORY — DX: Other specified postprocedural states: Z98.890

## 2016-03-18 ENCOUNTER — Ambulatory Visit (INDEPENDENT_AMBULATORY_CARE_PROVIDER_SITE_OTHER): Payer: BC Managed Care – PPO

## 2016-03-18 DIAGNOSIS — Z23 Encounter for immunization: Secondary | ICD-10-CM

## 2016-03-29 DIAGNOSIS — J069 Acute upper respiratory infection, unspecified: Secondary | ICD-10-CM | POA: Diagnosis not present

## 2016-03-29 DIAGNOSIS — R05 Cough: Secondary | ICD-10-CM | POA: Diagnosis not present

## 2016-04-25 ENCOUNTER — Ambulatory Visit: Payer: Commercial Managed Care - HMO | Admitting: Family Medicine

## 2016-05-05 DIAGNOSIS — Z923 Personal history of irradiation: Secondary | ICD-10-CM

## 2016-05-05 DIAGNOSIS — C50919 Malignant neoplasm of unspecified site of unspecified female breast: Secondary | ICD-10-CM

## 2016-05-05 HISTORY — DX: Personal history of irradiation: Z92.3

## 2016-05-05 HISTORY — DX: Malignant neoplasm of unspecified site of unspecified female breast: C50.919

## 2016-05-06 ENCOUNTER — Ambulatory Visit (INDEPENDENT_AMBULATORY_CARE_PROVIDER_SITE_OTHER): Payer: BC Managed Care – PPO | Admitting: Family Medicine

## 2016-05-06 ENCOUNTER — Encounter: Payer: Self-pay | Admitting: Family Medicine

## 2016-05-06 DIAGNOSIS — E782 Mixed hyperlipidemia: Secondary | ICD-10-CM

## 2016-05-06 DIAGNOSIS — E6609 Other obesity due to excess calories: Secondary | ICD-10-CM

## 2016-05-06 DIAGNOSIS — E559 Vitamin D deficiency, unspecified: Secondary | ICD-10-CM | POA: Diagnosis not present

## 2016-05-06 DIAGNOSIS — Z6835 Body mass index (BMI) 35.0-35.9, adult: Secondary | ICD-10-CM | POA: Diagnosis not present

## 2016-05-06 DIAGNOSIS — N182 Chronic kidney disease, stage 2 (mild): Secondary | ICD-10-CM | POA: Diagnosis not present

## 2016-05-06 DIAGNOSIS — IMO0001 Reserved for inherently not codable concepts without codable children: Secondary | ICD-10-CM

## 2016-05-06 NOTE — Assessment & Plan Note (Signed)
Limit saturated fats; check lipids today 

## 2016-05-06 NOTE — Patient Instructions (Addendum)
  If you need something for aches or pains, try to use Tylenol (acetaminophen) instead of non-steroidals (which include Aleve, ibuprofen, Advil, Motrin, and naproxen); non-steroidals can cause long-term kidney damage  Check out the information at familydoctor.org entitled "Nutrition for Weight Loss: What You Need to Know about Fad Diets" Try to lose between 1-2 pounds per week by taking in fewer calories and burning off more calories You can succeed by limiting portions, limiting foods dense in calories and fat, becoming more active, and drinking 8 glasses of water a day (64 ounces) Don't skip meals, especially breakfast, as skipping meals may alter your metabolism Do not use over-the-counter weight loss pills or gimmicks that claim rapid weight loss A healthy BMI (or body mass index) is between 18.5 and 24.9 You can calculate your ideal BMI at the [place] website ClubMonetize.fr  Try to limit saturated fats in your diet (bologna, hot dogs, barbeque, cheeseburgers, hamburgers, steak, bacon, sausage, cheese, etc.) and get more fresh fruits, vegetables, and whole grains Limit eggs to no more than 3 yolks per week

## 2016-05-06 NOTE — Assessment & Plan Note (Signed)
Last level was 11 in June; taking 2,000 iu daily; recheck level to see if up above 30

## 2016-05-06 NOTE — Progress Notes (Signed)
BP (!) 146/88 (BP Location: Left Arm, Patient Position: Sitting, Cuff Size: Normal)   Pulse 78   Temp 97.7 F (36.5 C) (Oral)   Resp 14   Wt 199 lb 4 oz (90.4 kg)   SpO2 97%   BMI 36.44 kg/m    Subjective:    Patient ID: Marissa Nelson, female    DOB: 12/16/1950, 66 y.o.   MRN: [id number]  HPI: Marissa Nelson is a 66 y.o. female  Chief Complaint  Patient presents with  . Follow-up    6 months    She saw me for a wellness exam in July, and had her regular medical issue visit with Dr. Vicente Nelson in June 2017  HTN; she started having high blood pressure with her last pregnancy, 40 some years ago; not taking medicine, just monitored for a few years, then kept going up; for every pound she puts on; has gained weight over the years; taking medicine, no side effects  CKD stage 2; on ARB for protection; knows to avoid NSAIDs; had been taking Aleve but stopped after last visit with Dr. Vicente Nelson  Obesity; she wants to talk about having the surgery; she tries and tries; starts walking in the spring and then starts losing it; then stops walking and picks it up  High cholesterol; cousin has high cholesterol, lots of family members have high cholesterol; for breakfast, she will eat Special K, whole milk; used to use skim milk but it was sour one day and it turned her against it; eats early in the morning, then crackers; tries to have peanuts or crackers for mid-morning snack; sandwich for lunch or something halfway healthy; little bit of oil, little salt, chicken salad sandwich; for dinner, patient cooks, husband likes country style steak with gravy and creamed potatoes; not fasting  Vitamin D deficiency; level was 11 in June; taking 2,000 iu daily  Depression screen [facility] 2/9 05/06/2016 11/15/2015 06/15/2015 02/19/2015  Decreased Interest 0 0 0 0  Down, Depressed, Hopeless 0 0 0 0  PHQ - 2 Score 0 0 0 0    No flowsheet data found.  Relevant past medical, surgical, family and social history  reviewed Past Medical History:  Diagnosis Date  . Chronic kidney disease, stage II (mild) 11/15/2015  . Hx of right breast biopsy 01/17/2016  . Hyperlipidemia   . Hypertension   . Obesity (BMI 30.0-34.9) 10/25/2015   Past Surgical History:  Procedure Laterality Date  . BREAST BIOPSY Right 01/15/2016   path pending  . NO PAST SURGERIES     Family History  Problem Relation Age of Onset  . Breast cancer Neg Hx    Social History  Substance Use Topics  . Smoking status: Never Smoker  . Smokeless tobacco: Not on file  . Alcohol use No    Interim medical history since last visit reviewed. Allergies and medications reviewed  Review of Systems Per HPI unless specifically indicated above     Objective:    BP (!) 146/88 (BP Location: Left Arm, Patient Position: Sitting, Cuff Size: Normal)   Pulse 78   Temp 97.7 F (36.5 C) (Oral)   Resp 14   Wt 199 lb 4 oz (90.4 kg)   SpO2 97%   BMI 36.44 kg/m   Wt Readings from Last 3 Encounters:  05/06/16 199 lb 4 oz (90.4 kg)  11/15/15 196 lb 6 oz (89.1 kg)  10/25/15 194 lb 7 oz (88.2 kg)    Physical Exam  Constitutional: She  appears well-developed and well-nourished. No distress.  HENT:  Head: Normocephalic and atraumatic.  Eyes: EOM are normal. No scleral icterus.  Neck: No thyromegaly present.  Cardiovascular: Normal rate, regular rhythm and normal heart sounds.   No murmur heard. Pulmonary/Chest: Effort normal and breath sounds normal. No respiratory distress. She has no wheezes.  Abdominal: Soft. Bowel sounds are normal. She exhibits no distension.  Musculoskeletal: Normal range of motion. She exhibits no edema.  Neurological: She is alert. She exhibits normal muscle tone.  Skin: Skin is warm and dry. She is not diaphoretic. No pallor.  Psychiatric: She has a normal mood and affect. Her behavior is normal. Judgment and thought content normal.   Results for orders placed or performed during the hospital encounter of 01/15/16    Surgical pathology  Result Value Ref Range   SURGICAL PATHOLOGY      Surgical Pathology CASE: [id number] PATIENT: Marissa Nelson Surgical Pathology Report     SPECIMEN SUBMITTED: A. Breast, right, UOQ  CLINICAL HISTORY: Right breast UOQ 4 mm group of calcifications  PRE-OPERATIVE DIAGNOSIS: Probable FA or fibroadenomatoid changes.  R/O DCIS  POST-OPERATIVE DIAGNOSIS: None provided.     DIAGNOSIS: A. 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 - NEGATIVE FOR ATYPIA AND MALIGNANCY.   GROSS DESCRIPTION: A. The specimen is received in a formalin-filled CoreTainer. Labeled: Right breast upper outer quadrant Accompanying specimen x-ray: Yes, calcifications noted in sections 9 and 12 Time/date in fixative: 1:32 PM on 01/15/2016 Cold ischemic time: 3 minutes Total fixation time: 6-1/2 hours Core pieces: Multiple Measurement: 2 x 1 x 0.9 cm Description/comments: Aggregate of pale yellow cylindrically shaped tissue fragments  Inked: Blue Entirely submitted in cassette(s): 1-12:00 section 2-3:00 section 3-6:00 section 4-9:00 section 5-Central section   Final Diagnosis performed by Marissa Lemma, MD.  Electronically signed 01/16/2016 4:02:17PM    The electronic signature indicates that the named Attending Pathologist has evaluated the specimen  Technical component performed at [facility], [address] [place], [place] [address] Lab: [phone] Dir: Darrick Penna. Evette Doffing, MD  Professional component performed at [facility], [facility], [address], [place], [place] [address] Lab: [phone] Dir: Dellia Nims. Rubinas, MD       Assessment & Plan:   Problem List Items Addressed This Visit      Genitourinary   Chronic kidney disease, stage II (mild)    Avoid NSAIDs, stay well-hydrated      Relevant Orders   COMPLETE METABOLIC PANEL WITH GFR      Other   Vitamin D deficiency    Last level was 11 in June; taking 2,000 iu daily; recheck level to see if up above 30      Relevant Orders   VITAMIN D 25 Hydroxy (Vit-D Deficiency, Fractures)   Obesity    Struggled for years with weight; wishes to at least consult with bariatric surgeon; meets criteria, BMI >35 and HTN, OSA, high cholesterol; referral entered      Relevant Orders   Ambulatory referral to General Surgery   HLD (hyperlipidemia)    Limit saturated fats; check lipids today      Relevant Orders   Lipid panel       Follow up plan: Return in about 6 months (around 11/10/2016) for follow-up with fasting labs.  An after-visit summary was printed and given to the patient at [facility].  Please see the patient instructions which may contain other information and recommendations beyond what is mentioned  above in the assessment and plan.  No orders of the defined types were placed in this encounter.   Orders Placed This Encounter  Procedures  . VITAMIN D 25 Hydroxy (Vit-D Deficiency, Fractures)  . Lipid panel  . COMPLETE METABOLIC PANEL WITH GFR  . Ambulatory referral to General Surgery    Orders for labs printed; patient will return fasting

## 2016-05-06 NOTE — Assessment & Plan Note (Signed)
Struggled for years with weight; wishes to at least consult with bariatric surgeon; meets criteria, BMI >35 and HTN, OSA, high cholesterol; referral entered

## 2016-05-06 NOTE — Assessment & Plan Note (Signed)
Avoid NSAIDs, stay well hydrated 

## 2016-06-04 DIAGNOSIS — H40003 Preglaucoma, unspecified, bilateral: Secondary | ICD-10-CM | POA: Diagnosis not present

## 2016-06-04 DIAGNOSIS — H40001 Preglaucoma, unspecified, right eye: Secondary | ICD-10-CM | POA: Diagnosis not present

## 2016-06-04 DIAGNOSIS — H35033 Hypertensive retinopathy, bilateral: Secondary | ICD-10-CM | POA: Diagnosis not present

## 2016-06-04 DIAGNOSIS — H524 Presbyopia: Secondary | ICD-10-CM | POA: Diagnosis not present

## 2016-06-07 ENCOUNTER — Telehealth: Payer: Self-pay | Admitting: Family Medicine

## 2016-06-07 NOTE — Telephone Encounter (Signed)
Please ask patient to have the labs drawn that were ordered on May 06, 2016 Please go fasting Thank you

## 2016-06-09 NOTE — Telephone Encounter (Signed)
Patient notified will come wed. To get done orders placed up front.

## 2016-06-11 ENCOUNTER — Telehealth: Payer: Self-pay | Admitting: Family Medicine

## 2016-06-11 DIAGNOSIS — R928 Other abnormal and inconclusive findings on diagnostic imaging of breast: Secondary | ICD-10-CM

## 2016-06-11 NOTE — Assessment & Plan Note (Signed)
Check diag mammo in March

## 2016-06-11 NOTE — Telephone Encounter (Signed)
Recommendation: Six month follow-up diagnostic mammogram of the right breast for an additional group of probably benign Calcifications. -------------------------------- Orders Placed This Encounter  Procedures  . MM Digital Diagnostic Unilat R    Standing Status:   Future    Standing Expiration Date:   01/14/2017    Order Specific Question:   Reason for Exam (SYMPTOM  OR DIAGNOSIS REQUIRED)    Answer:   calcifications RIGHT breast, abnormal mammo Sept 2017    Order Specific Question:   Preferred imaging location?    Answer:   [facility]   Orders entered and signed by Dr. Sanda Klein today ------------------------------------  You can let her know that I took care of this for her Thank you

## 2016-06-11 NOTE — Telephone Encounter (Signed)
Pt requesting that you send an order to [facility] for her follow up diagnostic mammogram

## 2016-06-12 ENCOUNTER — Encounter: Payer: Self-pay | Admitting: Family Medicine

## 2016-06-12 ENCOUNTER — Ambulatory Visit (INDEPENDENT_AMBULATORY_CARE_PROVIDER_SITE_OTHER): Payer: BC Managed Care – PPO | Admitting: Family Medicine

## 2016-06-12 VITALS — BP 140/82 | HR 76 | Temp 97.8°F | Resp 14 | Wt 197.6 lb

## 2016-06-12 DIAGNOSIS — R938 Abnormal findings on diagnostic imaging of other specified body structures: Secondary | ICD-10-CM

## 2016-06-12 DIAGNOSIS — I10 Essential (primary) hypertension: Secondary | ICD-10-CM

## 2016-06-12 DIAGNOSIS — R682 Dry mouth, unspecified: Secondary | ICD-10-CM | POA: Diagnosis not present

## 2016-06-12 DIAGNOSIS — E782 Mixed hyperlipidemia: Secondary | ICD-10-CM

## 2016-06-12 DIAGNOSIS — H579 Unspecified disorder of eye and adnexa: Secondary | ICD-10-CM

## 2016-06-12 DIAGNOSIS — Z833 Family history of diabetes mellitus: Secondary | ICD-10-CM

## 2016-06-12 DIAGNOSIS — N182 Chronic kidney disease, stage 2 (mild): Secondary | ICD-10-CM

## 2016-06-12 DIAGNOSIS — R928 Other abnormal and inconclusive findings on diagnostic imaging of breast: Secondary | ICD-10-CM

## 2016-06-12 HISTORY — DX: Family history of diabetes mellitus: Z83.3

## 2016-06-12 NOTE — Progress Notes (Signed)
BP 140/82   Pulse 76   Temp 97.8 F (36.6 C) (Oral)   Resp 14   Wt 197 lb 9 oz (89.6 kg)   SpO2 97%   BMI 36.13 kg/m    Subjective:    Patient ID: Marissa Nelson, female    DOB: 03-31-1951, 66 y.o.   MRN: [id number]  HPI: MERLINDA WRUBEL is a 66 y.o. female  Chief Complaint  Patient presents with  . Blood Sugar Problem    discuss blood sugar    Patient is here for f/u to discuss blood sugar Patient had her eyes examined and the doctor found a spot on her eye and said she might have high blood sugar He saw a spot on her eye and it indicates she has high sugar; she was told to go to her doctor and get sugars checked; she saw the eye doctor 2 weeks ago Lots of diabetes in the family Mother had diabetes; died at age 73 from liver cancer; got diabetes in her late 14s or early 62s Has had dry mouth; no real blurrred vision; urinates every night, 1x a night; 3 am No real frequency during the day She has lost a few pounds lately but it goes up and down; gaining even thought not eating very much No thyroid disease in the family She came in Wednesday Not much of a bread eater Drinking less regular soft drinks  Her blood pressure has been doing pretty well; patient feels good; no chest pain  High cholesterol; tries to avoid fatty foods; likes cheese; not drinking much milk  Rash on the forearms; new soap at school; little tiny bumps break out  Abnormal mammo and due for next in March; patient not feeling any lumps  Depression screen [facility] 2/9 06/12/2016 05/06/2016 11/15/2015 06/15/2015 02/19/2015  Decreased Interest 0 0 0 0 0  Down, Depressed, Hopeless 0 0 0 0 0  PHQ - 2 Score 0 0 0 0 0   Relevant past medical, surgical, family and social history reviewed Past Medical History:  Diagnosis Date  . Chronic kidney disease, stage II (mild) 11/15/2015  . Hx of right breast biopsy 01/17/2016  . Hyperlipidemia   . Hypertension   . Obesity (BMI 30.0-34.9) 10/25/2015  . Prediabetes     Past Surgical History:  Procedure Laterality Date  . BREAST BIOPSY Right 01/15/2016   path pending  . NO PAST SURGERIES     Family History  Problem Relation Age of Onset  . Cancer Mother 80    liver cancer  . Diabetes Mother   . Hypertension Mother   . Glomerulonephritis Father   . Diabetes Sister   . Glomerulonephritis Sister   . Cancer Sister 48    throat cancer  . Heart disease Sister   . Diabetes Sister   . Diabetes Sister   . Varicose Veins Neg Hx    Social History  Substance Use Topics  . Smoking status: Never Smoker  . Smokeless tobacco: Never Used  . Alcohol use No   Interim medical history since last visit reviewed. Allergies and medications reviewed  Review of Systems Per HPI unless specifically indicated above     Objective:    BP 140/82   Pulse 76   Temp 97.8 F (36.6 C) (Oral)   Resp 14   Wt 197 lb 9 oz (89.6 kg)   SpO2 97%   BMI 36.13 kg/m   Wt Readings from Last 3 Encounters:  06/12/16 197 lb  9 oz (89.6 kg)  05/06/16 199 lb 4 oz (90.4 kg)  11/15/15 196 lb 6 oz (89.1 kg)    Physical Exam  Constitutional: She appears well-developed and well-nourished. No distress.  HENT:  Head: Normocephalic and atraumatic.  Eyes: EOM are normal. No scleral icterus.  Neck: No thyromegaly present.  Cardiovascular: Normal rate, regular rhythm and normal heart sounds.   No murmur heard. Pulmonary/Chest: Effort normal and breath sounds normal. No respiratory distress. She has no wheezes.  Abdominal: Soft. Bowel sounds are normal. She exhibits no distension.  Musculoskeletal: Normal range of motion. She exhibits no edema.  Neurological: She is alert. She exhibits normal muscle tone.  Skin: Skin is warm and dry. She is not diaphoretic. No pallor.  Psychiatric: She has a normal mood and affect. Her behavior is normal. Judgment and thought content normal.      Assessment & Plan:   Problem List Items Addressed This Visit      Cardiovascular and  Mediastinum   Benign essential HTN    Try the DASH guidelines        Genitourinary   Chronic kidney disease, stage II (mild)    Knows to avoid NSAIDs        Other   HLD (hyperlipidemia)    Avoid saturated fats; check cholesterol on Monday fasting      Family history of diabetes mellitus    Check A1c and glucose      Relevant Orders   Hemoglobin A1c (Completed)   Abnormal mammogram of right breast    Order already entered yesterday       Other Visit Diagnoses    Eye exam abnormal    -  Primary   Relevant Orders   Hemoglobin A1c (Completed)   Dry mouth       Relevant Orders   Hemoglobin A1c (Completed)      Follow up plan: No Follow-up on file.  An after-visit summary was printed and given to the patient at [facility].  Please see the patient instructions which may contain other information and recommendations beyond what is mentioned above in the assessment and plan.  No orders of the defined types were placed in this encounter.   Orders Placed This Encounter  Procedures  . Hemoglobin A1c

## 2016-06-12 NOTE — Assessment & Plan Note (Signed)
Knows to avoid NSAIDs

## 2016-06-12 NOTE — Assessment & Plan Note (Signed)
Try the DASH guidelines

## 2016-06-12 NOTE — Assessment & Plan Note (Signed)
Check A1c and glucose 

## 2016-06-12 NOTE — Patient Instructions (Signed)
Your goal blood pressure is less than 150 mmHg on top. Try to follow the DASH guidelines (DASH stands for Dietary Approaches to Stop Hypertension) Try to limit the sodium in your diet.  Ideally, consume less than 1.5 grams (less than 1,500mg ) per day. Do not add salt when cooking or at the table.  Check the sodium amount on labels when shopping, and choose items lower in sodium when given a choice. Avoid or limit foods that already contain a lot of sodium. Eat a diet rich in fruits and vegetables and whole grains. Return on Monday for labs Keep working on weight loss

## 2016-06-12 NOTE — Assessment & Plan Note (Signed)
Order already entered yesterday

## 2016-06-12 NOTE — Assessment & Plan Note (Signed)
Avoid saturated fats; check cholesterol on Monday fasting

## 2016-06-16 ENCOUNTER — Other Ambulatory Visit: Payer: Self-pay | Admitting: Family Medicine

## 2016-06-16 LAB — COMPLETE METABOLIC PANEL WITH GFR
ALBUMIN: 3.7 g/dL (ref 3.6–5.1)
ALK PHOS: 76 U/L (ref 33–130)
ALT: 11 U/L (ref 6–29)
AST: 14 U/L (ref 10–35)
BILIRUBIN TOTAL: 0.3 mg/dL (ref 0.2–1.2)
BUN: 14 mg/dL (ref 7–25)
CALCIUM: 9.5 mg/dL (ref 8.6–10.4)
CO2: 29 mmol/L (ref 20–31)
CREATININE: 0.98 mg/dL (ref 0.50–0.99)
Chloride: 106 mmol/L (ref 98–110)
GFR, EST AFRICAN AMERICAN: 69 mL/min (ref >=60)
GFR, EST NON AFRICAN AMERICAN: 60 mL/min (ref >=60)
Glucose, Bld: 100 mg/dL — ABNORMAL HIGH (ref 65–99)
Potassium: 4.7 mmol/L (ref 3.5–5.3)
Sodium: 141 mmol/L (ref 135–146)
TOTAL PROTEIN: 6.8 g/dL (ref 6.1–8.1)

## 2016-06-16 LAB — LIPID PANEL
Cholesterol: 179 mg/dL (ref <200)
HDL: 56 mg/dL (ref >50)
LDL Cholesterol: 109 mg/dL — ABNORMAL HIGH (ref <100)
TRIGLYCERIDES: 72 mg/dL (ref <150)
Total CHOL/HDL Ratio: 3.2 Ratio (ref <5.0)
VLDL: 14 mg/dL (ref <30)

## 2016-06-16 LAB — HEMOGLOBIN A1C
Hgb A1c MFr Bld: 5.8 % — ABNORMAL HIGH (ref <5.7)
Mean Plasma Glucose: 120 mg/dL

## 2016-06-17 LAB — VITAMIN D 25 HYDROXY (VIT D DEFICIENCY, FRACTURES): Vit D, 25-Hydroxy: 12 ng/mL — ABNORMAL LOW (ref 30–100)

## 2016-06-20 ENCOUNTER — Encounter: Payer: Self-pay | Admitting: Family Medicine

## 2016-06-20 DIAGNOSIS — R7303 Prediabetes: Secondary | ICD-10-CM | POA: Insufficient documentation

## 2016-06-22 ENCOUNTER — Other Ambulatory Visit: Payer: Self-pay | Admitting: Family Medicine

## 2016-06-22 MED ORDER — VITAMIN D (ERGOCALCIFEROL) 1.25 MG (50000 UNIT) PO CAPS: 50000.0000 [IU] | ORAL_CAPSULE | ORAL | 1 refills | Status: AC

## 2016-06-22 NOTE — Progress Notes (Signed)
Start Rx vit D weekly x 8 weeks

## 2016-06-26 ENCOUNTER — Telehealth: Payer: Self-pay | Admitting: Family Medicine

## 2016-06-26 DIAGNOSIS — R928 Other abnormal and inconclusive findings on diagnostic imaging of breast: Secondary | ICD-10-CM

## 2016-06-26 NOTE — Telephone Encounter (Signed)
-----   Message from Arnetha Courser, MD sent at 01/17/2016  5:51 PM EDT ----- Regarding: 6 month breast imaging due soon Recommendation: Six month follow-up diagnostic mammogram of the right breast for an additional group of probably benign calcifications.

## 2016-06-26 NOTE — Assessment & Plan Note (Signed)
Order diag mammo

## 2016-06-26 NOTE — Telephone Encounter (Signed)
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 NEGATIVE FOR ATYPIA AND MALIGNANCY. This was found to be concordant by Dr. Joneen Caraway.  Recommendation: Six month follow-up diagnostic mammogram of the right breast for an additional group of probably benign Calcifications. ------------------------------------ Ordering diag mammo  Orders Placed This Encounter  Procedures  . MM Digital Diagnostic Unilat R    Standing Status:   Future    Standing Expiration Date:   08/24/2017    Order Specific Question:   Reason for Exam (SYMPTOM  OR DIAGNOSIS REQUIRED)    Answer:   abnormal breast imaging, RIGHT breast    Order Specific Question:   Preferred imaging location?    Answer:   [facility]

## 2016-07-24 DIAGNOSIS — H40023 Open angle with borderline findings, high risk, bilateral: Secondary | ICD-10-CM | POA: Diagnosis not present

## 2016-10-02 ENCOUNTER — Encounter: Payer: Self-pay | Admitting: Family Medicine

## 2016-10-02 ENCOUNTER — Ambulatory Visit (INDEPENDENT_AMBULATORY_CARE_PROVIDER_SITE_OTHER): Payer: BC Managed Care – PPO | Admitting: Family Medicine

## 2016-10-02 VITALS — BP 146/84 | HR 78 | Temp 98.0°F | Resp 14 | Wt 193.7 lb

## 2016-10-02 DIAGNOSIS — M25512 Pain in left shoulder: Secondary | ICD-10-CM | POA: Diagnosis not present

## 2016-10-02 DIAGNOSIS — R5383 Other fatigue: Secondary | ICD-10-CM | POA: Diagnosis not present

## 2016-10-02 DIAGNOSIS — I10 Essential (primary) hypertension: Secondary | ICD-10-CM

## 2016-10-02 DIAGNOSIS — E559 Vitamin D deficiency, unspecified: Secondary | ICD-10-CM

## 2016-10-02 DIAGNOSIS — N182 Chronic kidney disease, stage 2 (mild): Secondary | ICD-10-CM | POA: Diagnosis not present

## 2016-10-02 DIAGNOSIS — M25511 Pain in right shoulder: Secondary | ICD-10-CM | POA: Diagnosis not present

## 2016-10-02 DIAGNOSIS — G8929 Other chronic pain: Secondary | ICD-10-CM | POA: Diagnosis not present

## 2016-10-02 NOTE — Patient Instructions (Signed)
Let's get labs today Start the new low dose medicine for your blood pressure, take every evening Your goal blood pressure is less than 140 mmHg on top. Try to follow the DASH guidelines (DASH stands for Dietary Approaches to Stop Hypertension) Try to limit the sodium in your diet.  Ideally, consume less than 1.5 grams (less than 1,500mg ) per day. Do not add salt when cooking or at the table.  Check the sodium amount on labels when shopping, and choose items lower in sodium when given a choice. Avoid or limit foods that already contain a lot of sodium. Eat a diet rich in fruits and vegetables and whole grains. We'll have you see the orthopaedist
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
## 2016-10-02 NOTE — Assessment & Plan Note (Signed)
Not to goal; continue ARB-thiazide; add low dose CCB in the evening

## 2016-10-02 NOTE — Assessment & Plan Note (Addendum)
Avoid NSAIDs as much as possible; showed her the last 3 creatinine readings; some improvement on last draw

## 2016-10-02 NOTE — Assessment & Plan Note (Signed)
Check level and replace if needed; low D can cause fatigue

## 2016-10-03 ENCOUNTER — Other Ambulatory Visit: Payer: Self-pay | Admitting: Family Medicine

## 2016-10-03 ENCOUNTER — Other Ambulatory Visit: Payer: Self-pay

## 2016-10-03 DIAGNOSIS — Z1211 Encounter for screening for malignant neoplasm of colon: Secondary | ICD-10-CM

## 2016-10-03 DIAGNOSIS — D649 Anemia, unspecified: Secondary | ICD-10-CM

## 2016-10-03 LAB — CBC WITH DIFFERENTIAL/PLATELET
Basophils Absolute: 61 cells/uL (ref 0–200)
Basophils Relative: 1 %
EOS ABS: 122 {cells}/uL (ref 15–500)
EOS PCT: 2 %
HCT: 35.3 % (ref 35.0–45.0)
Hemoglobin: 11.5 g/dL — ABNORMAL LOW (ref 11.7–15.5)
LYMPHS PCT: 35 %
Lymphs Abs: 2135 cells/uL (ref 850–3900)
MCH: 30.7 pg (ref 27.0–33.0)
MCHC: 32.6 g/dL (ref 32.0–36.0)
MCV: 94.1 fL (ref 80.0–100.0)
MONOS PCT: 7 %
MPV: 11 fL (ref 7.5–12.5)
Monocytes Absolute: 427 cells/uL (ref 200–950)
Neutro Abs: 3355 cells/uL (ref 1500–7800)
Neutrophils Relative %: 55 %
PLATELETS: 243 10*3/uL (ref 140–400)
RBC: 3.75 MIL/uL — ABNORMAL LOW (ref 3.80–5.10)
RDW: 13.9 % (ref 11.0–15.0)
WBC: 6.1 10*3/uL (ref 3.8–10.8)

## 2016-10-03 LAB — BASIC METABOLIC PANEL
BUN: 16 mg/dL (ref 7–25)
CALCIUM: 9.4 mg/dL (ref 8.6–10.4)
CHLORIDE: 106 mmol/L (ref 98–110)
CO2: 24 mmol/L (ref 20–31)
CREATININE: 1.1 mg/dL — AB (ref 0.50–0.99)
Glucose, Bld: 84 mg/dL (ref 65–99)
Potassium: 4.4 mmol/L (ref 3.5–5.3)
Sodium: 141 mmol/L (ref 135–146)

## 2016-10-03 LAB — ANA,IFA RA DIAG PNL W/RFLX TIT/PATN: Anti Nuclear Antibody(ANA): POSITIVE — AB

## 2016-10-03 LAB — ANTI-NUCLEAR AB-TITER (ANA TITER)

## 2016-10-03 LAB — SEDIMENTATION RATE: SED RATE: 24 mm/h (ref 0–30)

## 2016-10-03 LAB — VITAMIN D 25 HYDROXY (VIT D DEFICIENCY, FRACTURES): Vit D, 25-Hydroxy: 17 ng/mL — ABNORMAL LOW (ref 30–100)

## 2016-10-03 MED ORDER — VITAMIN D (ERGOCALCIFEROL) 1.25 MG (50000 UNIT) PO CAPS: 50000.0000 [IU] | ORAL_CAPSULE | ORAL | 1 refills | Status: AC

## 2016-10-03 NOTE — Progress Notes (Signed)
Start back on Rx vitamin D

## 2016-10-04 ENCOUNTER — Other Ambulatory Visit: Payer: Self-pay | Admitting: Family Medicine

## 2016-10-04 DIAGNOSIS — R768 Other specified abnormal immunological findings in serum: Secondary | ICD-10-CM

## 2016-10-04 DIAGNOSIS — M2550 Pain in unspecified joint: Secondary | ICD-10-CM

## 2016-10-04 HISTORY — DX: Other specified abnormal immunological findings in serum: R76.8

## 2016-10-04 HISTORY — DX: Pain in unspecified joint: M25.50

## 2016-10-04 NOTE — Assessment & Plan Note (Signed)
Refer to rheum 

## 2016-10-04 NOTE — Progress Notes (Signed)
Refer to rheum 

## 2016-11-04 ENCOUNTER — Encounter: Payer: Self-pay | Admitting: Family Medicine

## 2016-11-04 ENCOUNTER — Ambulatory Visit (INDEPENDENT_AMBULATORY_CARE_PROVIDER_SITE_OTHER): Payer: BC Managed Care – PPO | Admitting: Family Medicine

## 2016-11-04 VITALS — BP 124/82 | HR 86 | Temp 97.9°F | Resp 14 | Ht 62.0 in | Wt 194.4 lb

## 2016-11-04 DIAGNOSIS — M2550 Pain in unspecified joint: Secondary | ICD-10-CM

## 2016-11-04 DIAGNOSIS — R768 Other specified abnormal immunological findings in serum: Secondary | ICD-10-CM | POA: Diagnosis not present

## 2016-11-04 DIAGNOSIS — I10 Essential (primary) hypertension: Secondary | ICD-10-CM

## 2016-11-04 DIAGNOSIS — R7303 Prediabetes: Secondary | ICD-10-CM | POA: Diagnosis not present

## 2016-11-04 DIAGNOSIS — R928 Other abnormal and inconclusive findings on diagnostic imaging of breast: Secondary | ICD-10-CM

## 2016-11-04 DIAGNOSIS — E559 Vitamin D deficiency, unspecified: Secondary | ICD-10-CM

## 2016-11-04 DIAGNOSIS — Z1231 Encounter for screening mammogram for malignant neoplasm of breast: Secondary | ICD-10-CM | POA: Diagnosis not present

## 2016-11-04 DIAGNOSIS — Z6835 Body mass index (BMI) 35.0-35.9, adult: Secondary | ICD-10-CM

## 2016-11-04 DIAGNOSIS — Z1239 Encounter for other screening for malignant neoplasm of breast: Secondary | ICD-10-CM

## 2016-11-04 NOTE — Assessment & Plan Note (Signed)
Avoid NSAIDs; hydrated

## 2016-11-04 NOTE — Progress Notes (Signed)
BP 124/82   Pulse 86   Temp 97.9 F (36.6 C) (Oral)   Resp 14   Ht '5\' 2"'  (1.575 m)   Wt 194 lb 6.4 oz (88.2 kg)   SpO2 96%   BMI 35.56 kg/m    Subjective:    Patient ID: Marissa Nelson, female    DOB: 01-07-1951, 66 y.o.   MRN: [id number]  HPI: AMADA HALLISEY is a 66 y.o. female  Chief Complaint  Patient presents with  . Follow-up    6 month   HPI Patient is here for follpw-up She had a positive ANA and is going to see rheumatogist for her aches and pains; shoulder still hurts Mild anemia; she was referred to GI on June 1st but has not heard anything yet; no dark stools; feeling tired but teaches CKD; she is avoiding NSAIDs; using tylenol if needed; she used to use aleve but stopped that; kidney disease runs in the family HTN; well-controlled today; taking ARB-thiazide combo; avoiding salt; pleased with BP reading today She had vit D deficiency and finished up her Rx and starting OTC Prediabetes; avoids Coke now, water now; last A1c was 5.8 Cholesterol panel reviewed; HDL 56, LDL 109 in February Obesity; she is going to start walking more and try to lose a little weight  Depression screen [facility] 2/9 11/04/2016 10/02/2016 06/12/2016 05/06/2016 11/15/2015  Decreased Interest 0 0 0 0 0  Down, Depressed, Hopeless 0 0 0 0 0  PHQ - 2 Score 0 0 0 0 0    Relevant past medical, surgical, family and social history reviewed Past Medical History:  Diagnosis Date  . Chronic kidney disease, stage II (mild) 11/15/2015  . Hx of right breast biopsy 01/17/2016  . Hyperlipidemia   . Hypertension   . Obesity (BMI 30.0-34.9) 10/25/2015  . Prediabetes    Past Surgical History:  Procedure Laterality Date  . BREAST BIOPSY Right 01/15/2016   path pending  . NO PAST SURGERIES     Family History  Problem Relation Age of Onset  . Cancer Mother 65       liver cancer  . Diabetes Mother   . Hypertension Mother   . Glomerulonephritis Father   . Diabetes Sister   . Glomerulonephritis  Sister   . Cancer Sister 19       throat cancer  . Heart disease Sister   . Diabetes Sister   . Diabetes Sister   . Varicose Veins Neg Hx    Social History   Social History  . Marital status: Married    Spouse name: N/A  . Number of children: N/A  . Years of education: N/A   Occupational History  . Not on file.   Social History Main Topics  . Smoking status: Never Smoker  . Smokeless tobacco: Never Used  . Alcohol use No  . Drug use: No  . Sexual activity: Not Currently   Other Topics Concern  . Not on file   Social History Narrative  . No narrative on file    Interim medical history since last visit reviewed. Allergies and medications reviewed  Review of Systems Per HPI unless specifically indicated above     Objective:    BP 124/82   Pulse 86   Temp 97.9 F (36.6 C) (Oral)   Resp 14   Ht '5\' 2"'  (1.575 m)   Wt 194 lb 6.4 oz (88.2 kg)   SpO2 96%   BMI 35.56 kg/m   Wt  Readings from Last 3 Encounters:  11/04/16 194 lb 6.4 oz (88.2 kg)  10/02/16 193 lb 11.2 oz (87.9 kg)  06/12/16 197 lb 9 oz (89.6 kg)    Physical Exam  Constitutional: She appears well-developed and well-nourished. No distress.  obese  HENT:  Head: Normocephalic and atraumatic.  Eyes: EOM are normal. No scleral icterus.  Neck: No thyromegaly present.  Cardiovascular: Normal rate, regular rhythm and normal heart sounds.   No murmur heard. Pulmonary/Chest: Effort normal and breath sounds normal. No respiratory distress. She has no wheezes.  Abdominal: Soft. Bowel sounds are normal. She exhibits no distension.  Musculoskeletal: Normal range of motion. She exhibits no edema.  Neurological: She is alert. She exhibits normal muscle tone.  Skin: Skin is warm and dry. She is not diaphoretic. No pallor.  No nailbed pallor  Psychiatric: She has a normal mood and affect. Her behavior is normal. Judgment and thought content normal. Her mood appears not anxious. She does not exhibit a depressed  mood.    Results for orders placed or performed in visit on 10/02/16  Sed Rate (ESR)  Result Value Ref Range   Sed Rate 24 0 - 30 mm/hr  ANA,IFA RA Diag Pnl w/rflx Tit/Patn  Result Value Ref Range   Rhuematoid fact SerPl-aCnc <14 <14 IU/mL   Anit Nuclear Antibody(ANA) POS (A) NEGATIVE   Cyclic Citrullin Peptide Ab <16 Units  VITAMIN D 25 Hydroxy (Vit-D Deficiency, Fractures)  Result Value Ref Range   Vit D, 25-Hydroxy 17 (L) 30 - 100 ng/mL  Basic Metabolic Panel (BMET)  Result Value Ref Range   Sodium 141 135 - 146 mmol/L   Potassium 4.4 3.5 - 5.3 mmol/L   Chloride 106 98 - 110 mmol/L   CO2 24 20 - 31 mmol/L   Glucose, Bld 84 65 - 99 mg/dL   BUN 16 7 - 25 mg/dL   Creat 1.10 (H) 0.50 - 0.99 mg/dL   Calcium 9.4 8.6 - 10.4 mg/dL  CBC with Differential/Platelet  Result Value Ref Range   WBC 6.1 3.8 - 10.8 K/uL   RBC 3.75 (L) 3.80 - 5.10 MIL/uL   Hemoglobin 11.5 (L) 11.7 - 15.5 g/dL   HCT 35.3 35.0 - 45.0 %   MCV 94.1 80.0 - 100.0 fL   MCH 30.7 27.0 - 33.0 pg   MCHC 32.6 32.0 - 36.0 g/dL   RDW 13.9 11.0 - 15.0 %   Platelets 243 140 - 400 K/uL   MPV 11.0 7.5 - 12.5 fL   Neutro Abs 3,355 1,500 - 7,800 cells/uL   Lymphs Abs 2,135 850 - 3,900 cells/uL   Monocytes Absolute 427 200 - 950 cells/uL   Eosinophils Absolute 122 15 - 500 cells/uL   Basophils Absolute 61 0 - 200 cells/uL   Neutrophils Relative % 55 %   Lymphocytes Relative 35 %   Monocytes Relative 7 %   Eosinophils Relative 2 %   Basophils Relative 1 %   Smear Review Criteria for review not met   Anti-nuclear ab-titer (ANA titer)  Result Value Ref Range   ANA Pattern 1 HOMOGENEOUS (A)    ANA Titer 1 1:160 (H) titer      Assessment & Plan:   Problem List Items Addressed This Visit      Cardiovascular and Mediastinum   Benign essential HTN - Primary    Well-controlled; continue ARB-thiazide        Other   Vitamin D deficiency    Patient completed the Rx  vit D and is taking OTC now      Prediabetes     Check A1c in August; work on modest weight loss, healthy eating; praise given for avoiding soft drinks      Positive ANA (antinuclear antibody)    With arthralgia; going to see rheum this week      Obesity    Patient will start walking and working on gradual weight loss      Arthralgia    With positive ANA; going to see rheum this week      Abnormal mammogram of right breast    Patient did not hear about the breast imaging ordered earlier this year; staff was asked today to check on this and get that scheduled; patient denies any new lumps       Other Visit Diagnoses    Screening for breast cancer       Relevant Orders   MM DIAG BREAST TOMO BILATERAL   Abnormal mammogram       Relevant Orders   MM DIAG BREAST TOMO BILATERAL    mammo orders re-entered by CMA   Follow up plan: Return in about 6 months (around 05/07/2017) for twenty minute follow-up with fasting labs; Medicare wellness visit yearly when due.  An after-visit summary was printed and given to the patient at [place].  Please see the patient instructions which may contain other information and recommendations beyond what is mentioned above in the assessment and plan.  No orders of the defined types were placed in this encounter.   Orders Placed This Encounter  Procedures  . MM DIAG BREAST TOMO BILATERAL

## 2016-11-04 NOTE — Assessment & Plan Note (Signed)
Patient completed the Rx vit D and is taking OTC now

## 2016-11-04 NOTE — Assessment & Plan Note (Signed)
Well-controlled; continue ARB-thiazide

## 2016-11-04 NOTE — Assessment & Plan Note (Signed)
Check A1c in August; work on modest weight loss, healthy eating; praise given for avoiding soft drinks

## 2016-11-04 NOTE — Assessment & Plan Note (Signed)
Patient will start walking and working on gradual weight loss

## 2016-11-04 NOTE — Patient Instructions (Addendum)
Return just for fasting labs on or after August 13th Please return the stool cards at your earliest convenience We'll have you see the rheumatologist and gastroenterologist You are due for the mammogram follow-up; talk to [facility] try to walk more and limit portions Check out the information at familydoctor.org entitled "Nutrition for Weight Loss: What You Need to Know about Fad Diets" Try to lose between 1-2 pounds per week by taking in fewer calories and burning off more calories You can succeed by limiting portions, limiting foods dense in calories and fat, becoming more active, and drinking 8 glasses of water a day (64 ounces) Don't skip meals, especially breakfast, as skipping meals may alter your metabolism Do not use over-the-counter weight loss pills or gimmicks that claim rapid weight loss A healthy BMI (or body mass index) is between 18.5 and 24.9 You can calculate your ideal BMI at the [place] website [employer]fr

## 2016-11-04 NOTE — Assessment & Plan Note (Signed)
Fairly well-controlled; work on weight loss

## 2016-11-04 NOTE — Assessment & Plan Note (Signed)
With arthralgia; going to see rheum this week

## 2016-11-04 NOTE — Assessment & Plan Note (Signed)
With positive ANA; going to see rheum this week

## 2016-11-04 NOTE — Assessment & Plan Note (Signed)
Patient did not hear about the breast imaging ordered earlier this year; staff was asked today to check on this and get that scheduled; patient denies any new lumps

## 2016-11-06 ENCOUNTER — Other Ambulatory Visit: Payer: Self-pay

## 2016-11-06 ENCOUNTER — Other Ambulatory Visit
Admission: RE | Admit: 2016-11-06 | Discharge: 2016-11-06 | Disposition: A | Discharge status: Home / Self Care | Payer: BC Managed Care – PPO | Source: Ambulatory Visit | Attending: Gastroenterology | Admitting: Gastroenterology

## 2016-11-06 ENCOUNTER — Telehealth: Payer: Self-pay

## 2016-11-06 ENCOUNTER — Ambulatory Visit (INDEPENDENT_AMBULATORY_CARE_PROVIDER_SITE_OTHER): Payer: BC Managed Care – PPO | Admitting: Gastroenterology

## 2016-11-06 ENCOUNTER — Encounter: Payer: Self-pay | Admitting: Gastroenterology

## 2016-11-06 VITALS — BP 131/81 | HR 87 | Temp 97.5°F | Ht 62.0 in | Wt 196.6 lb

## 2016-11-06 DIAGNOSIS — D649 Anemia, unspecified: Secondary | ICD-10-CM

## 2016-11-06 DIAGNOSIS — R0683 Snoring: Secondary | ICD-10-CM

## 2016-11-06 DIAGNOSIS — R1013 Epigastric pain: Secondary | ICD-10-CM | POA: Insufficient documentation

## 2016-11-06 DIAGNOSIS — G8929 Other chronic pain: Secondary | ICD-10-CM

## 2016-11-06 LAB — HEPATIC FUNCTION PANEL
ALT: 20 U/L (ref 14–54)
AST: 22 U/L (ref 15–41)
Albumin: 3.8 g/dL (ref 3.5–5.0)
Alkaline Phosphatase: 77 U/L (ref 38–126)
Bilirubin, Direct: <0.1 mg/dL — ABNORMAL LOW (ref 0.1–0.5)
TOTAL PROTEIN: 7.6 g/dL (ref 6.5–8.1)
Total Bilirubin: 0.4 mg/dL (ref 0.3–1.2)

## 2016-11-06 LAB — FOLATE: Folate: 58 ng/mL (ref >5.9)

## 2016-11-06 LAB — VITAMIN B12: Vitamin B-12: 240 pg/mL (ref 180–914)

## 2016-11-06 NOTE — Progress Notes (Signed)
Jonathon Bellows MD, MRCP(U.K) [address]  [place],  [address]  Main: [phone]  Fax: [phone]   Gastroenterology Consultation  Referring Provider:     Arnetha Courser, MD Primary Care Physician:  Arnetha Courser, MD Primary Gastroenterologist:  Dr. Jonathon Bellows  Reason for Consultation:     Anemia        HPI:   Marissa Nelson is a 66 y.o. y/o female referred for consultation & management  by Dr. Sanda Klein, Satira Anis, MD.     She has been referred for anemia. She has been referred for a colonoscopy . Hb  Was 11.5 in 09/2016 with a MCV of 94. I do not see any available iron studies . No prior CBC to compare with . She says that when she was "very young " she was told she ws anemia. Not aware of any thalassemia or sickle cell disease. She feels tired in general , ongoing for last 4-5 months. Gained weight 6 lbs recently. She snores at night , wakes up in the night multiple times. Does not feel refreshed in the morning , does have early morning headaches. She also has restless legs.   Denies any rectal bleeding, no nasal bleeding, no vaginal bleeding , no blood in urine . Apetite good . No family history of colon cancer or polyps. Last colonoscopy was normal 10 years back . Normal bowel habits. She has had epigastric pain very few days, each episode lasts all day and sometimes a few hours, localized, eating makes it worse especially tomatoes , at times comes on without eating . A bowel movement does not help the pain. Stress makes it worse too  . Denies any NSAID use.    Past Medical History:  Diagnosis Date  . Chronic kidney disease, stage II (mild) 11/15/2015  . Hx of right breast biopsy 01/17/2016  . Hyperlipidemia   . Hypertension   . Obesity (BMI 30.0-34.9) 10/25/2015  . Prediabetes     Past Surgical History:  Procedure Laterality Date  . BREAST BIOPSY Right 01/15/2016   path pending  . NO PAST SURGERIES      Prior to Admission medications     Medication Sig Start Date End Date Taking? Authorizing Provider  aspirin EC 81 MG tablet Take 1 tablet (81 mg total) by mouth daily. 11/15/15   Lada, Satira Anis, MD  losartan-hydrochlorothiazide (HYZAAR) 100-25 MG tablet Take 1 tablet by mouth daily. 10/25/15   Plonk, Gwyndolyn Saxon, MD  Vitamin D, Ergocalciferol, (DRISDOL) 50000 units CAPS capsule Take 1 capsule (50,000 Units total) by mouth every 7 (seven) days. 10/03/16 11/28/16  Arnetha Courser, MD    Family History  Problem Relation Age of Onset  . Cancer Mother 76       liver cancer  . Diabetes Mother   . Hypertension Mother   . Glomerulonephritis Father   . Diabetes Sister   . Glomerulonephritis Sister   . Cancer Sister 39       throat cancer  . Heart disease Sister   . Diabetes Sister   . Diabetes Sister   . Varicose Veins Neg Hx      Social History  Substance Use Topics  . Smoking status: Never Smoker  . Smokeless tobacco: Never Used  . Alcohol use No    Allergies as of 11/06/2016 - Review Complete 11/04/2016  Allergen Reaction Noted  . Penicillin g Rash 02/19/2015    Review of Systems:    All  systems reviewed and negative except where noted in HPI.   Physical Exam:  There were no vitals taken for this visit. No LMP recorded. Patient has had a hysterectomy. Psych:  Alert and cooperative. Normal mood and affect. General:   Alert,  Well-developed, well-nourished, pleasant and cooperative in NAD Head:  Normocephalic and atraumatic. Eyes:  Sclera clear, no icterus.   Conjunctiva pink. Ears:  Normal auditory acuity. Nose:  No deformity, discharge, or lesions. Mouth:  No deformity or lesions,oropharynx pink & moist. Neck:  Supple; no masses or thyromegaly. Lungs:  Respirations even and unlabored.  Clear throughout to auscultation.   No wheezes, crackles, or rhonchi. No acute distress. Heart:  Regular rate and rhythm; no murmurs, clicks, rubs, or gallops. Abdomen:  Normal bowel sounds.  No bruits.  Soft, non-tender and  non-distended without masses, hepatosplenomegaly or hernias noted.  No guarding or rebound tenderness.    Msk:  Symmetrical without gross deformities. Good, equal movement & strength bilaterally. Neurologic:  Alert and oriented x3;  grossly normal neurologically. Psych:  Alert and cooperative. Normal mood and affect.  Imaging Studies: No results found.  Assessment and Plan:   Marissa Nelson is a 66 y.o. y/o female has been referred for  anemia. The anemia is normocytic. No overt signs of blood loss. I do note that the ANA is positive and she feels tired. She has been referred to Rheumatology and if she does have a rheumatological condition may also help explain the anemia.   Plan  1. Check iron studies , b12, folate , ferritin  2. Colonoscopy+ EGD for abdominal pain  and if has iron deficiency will also need a capsule study  3. RUQ USG to evaluate for gall stones along with LFT's 4. H pylori stool antigen  5. She has clinical features of sleep apnea and I will refer her to sleep apnea testing    I have discussed alternative options, risks & benefits,  which include, but are not limited to, bleeding, infection, perforation,respiratory complication & drug reaction.  The patient agrees with this plan & written consent will be obtained.   Follow up in 2-3 months   Dr Jonathon Bellows MD,MRCP(U.K)

## 2016-11-06 NOTE — Telephone Encounter (Signed)
Gastroenterology Pre-Procedure Review  Request Date: 7/18 Requesting Physician: Dr. Vicente Males  PATIENT REVIEW QUESTIONS: The patient responded to the following health history questions as indicated:    1. Are you having any GI issues? yes (anemia, abd pain) 2. Do you have a personal history of Polyps? no 3. Do you have a family history of Colon Cancer or Polyps? no 4. Diabetes Mellitus? no 5. Joint replacements in the past 12 months?no 6. Major health problems in the past 3 months?no 7. Any artificial heart valves, MVP, or defibrillator?no    MEDICATIONS & ALLERGIES:    Patient reports the following regarding taking any anticoagulation/antiplatelet therapy:   Plavix, Coumadin, Eliquis, Xarelto, Lovenox, Pradaxa, Brilinta, or Effient? no Aspirin? 81mg   Patient confirms/reports the following medications:  Current Outpatient Prescriptions  Medication Sig Dispense Refill  . aspirin EC 81 MG tablet Take 1 tablet (81 mg total) by mouth daily.    [name] Kitchen losartan-hydrochlorothiazide (HYZAAR) 100-25 MG tablet Take 1 tablet by mouth daily. 90 tablet 3  . Vitamin D, Ergocalciferol, (DRISDOL) 50000 units CAPS capsule Take 1 capsule (50,000 Units total) by mouth every 7 (seven) days. 4 capsule 1   No current facility-administered medications for this visit.     Patient confirms/reports the following allergies:  Allergies  Allergen Reactions  . Penicillin G Rash    No orders of the defined types were placed in this encounter.   AUTHORIZATION INFORMATION Primary Insurance: 1D#: Group #:  Secondary Insurance: 1D#: Group #:  SCHEDULE INFORMATION: Date: 7/18 Time: Location: [place]

## 2016-11-10 ENCOUNTER — Other Ambulatory Visit: Payer: Self-pay

## 2016-11-10 DIAGNOSIS — Z1211 Encounter for screening for malignant neoplasm of colon: Secondary | ICD-10-CM

## 2016-11-10 LAB — POC HEMOCCULT BLD/STL (HOME/3-CARD/SCREEN)
Card #1 Date: 7062018
Card #2 Date: 7072018
Card #2 Fecal Occult Blod, POC: NEGATIVE
Card #3 Date: 7092018
FECAL OCCULT BLD: NEGATIVE
FECAL OCCULT BLD: NEGATIVE

## 2016-11-12 ENCOUNTER — Ambulatory Visit
Admission: RE | Admit: 2016-11-12 | Discharge: 2016-11-12 | Disposition: A | Discharge status: Home / Self Care | Payer: BC Managed Care – PPO | Source: Ambulatory Visit | Attending: Gastroenterology | Admitting: Gastroenterology

## 2016-11-12 DIAGNOSIS — R1013 Epigastric pain: Secondary | ICD-10-CM | POA: Insufficient documentation

## 2016-11-12 DIAGNOSIS — G8929 Other chronic pain: Secondary | ICD-10-CM

## 2016-11-13 ENCOUNTER — Encounter: Payer: Self-pay | Admitting: *Deleted

## 2016-11-18 ENCOUNTER — Telehealth: Payer: Self-pay

## 2016-11-18 NOTE — Telephone Encounter (Signed)
-----   Message from Jonathon Bellows, MD sent at 11/16/2016  6:13 PM EDT ----- Normal ultrasound with no gall stones

## 2016-11-18 NOTE — Telephone Encounter (Signed)
Advised patient of results per Dr. Vicente Males.   Normal ultrasound with no gall stones

## 2016-11-19 ENCOUNTER — Ambulatory Visit: Payer: BC Managed Care – PPO | Admitting: Anesthesiology

## 2016-11-19 ENCOUNTER — Ambulatory Visit
Admission: RE | Admit: 2016-11-19 | Discharge: 2016-11-19 | Disposition: A | Discharge status: Home / Self Care | Payer: BC Managed Care – PPO | Source: Ambulatory Visit | Attending: Gastroenterology | Admitting: Gastroenterology

## 2016-11-19 ENCOUNTER — Encounter
Admission: RE | Disposition: A | Discharge status: Home / Self Care | Payer: Self-pay | Source: Ambulatory Visit | Attending: Gastroenterology

## 2016-11-19 DIAGNOSIS — Z833 Family history of diabetes mellitus: Secondary | ICD-10-CM | POA: Diagnosis not present

## 2016-11-19 DIAGNOSIS — M19042 Primary osteoarthritis, left hand: Secondary | ICD-10-CM | POA: Diagnosis not present

## 2016-11-19 DIAGNOSIS — D509 Iron deficiency anemia, unspecified: Secondary | ICD-10-CM

## 2016-11-19 DIAGNOSIS — Z7982 Long term (current) use of aspirin: Secondary | ICD-10-CM | POA: Diagnosis not present

## 2016-11-19 DIAGNOSIS — Z800 Family history of malignant neoplasm of digestive organs: Secondary | ICD-10-CM | POA: Diagnosis not present

## 2016-11-19 DIAGNOSIS — D649 Anemia, unspecified: Secondary | ICD-10-CM | POA: Diagnosis not present

## 2016-11-19 DIAGNOSIS — I129 Hypertensive chronic kidney disease with stage 1 through stage 4 chronic kidney disease, or unspecified chronic kidney disease: Secondary | ICD-10-CM | POA: Diagnosis not present

## 2016-11-19 DIAGNOSIS — K640 First degree hemorrhoids: Secondary | ICD-10-CM | POA: Insufficient documentation

## 2016-11-19 DIAGNOSIS — Z90710 Acquired absence of both cervix and uterus: Secondary | ICD-10-CM | POA: Insufficient documentation

## 2016-11-19 DIAGNOSIS — K2980 Duodenitis without bleeding: Secondary | ICD-10-CM | POA: Insufficient documentation

## 2016-11-19 DIAGNOSIS — R7303 Prediabetes: Secondary | ICD-10-CM | POA: Diagnosis not present

## 2016-11-19 DIAGNOSIS — Z6834 Body mass index (BMI) 34.0-34.9, adult: Secondary | ICD-10-CM | POA: Diagnosis not present

## 2016-11-19 DIAGNOSIS — Z79899 Other long term (current) drug therapy: Secondary | ICD-10-CM | POA: Diagnosis not present

## 2016-11-19 DIAGNOSIS — Z8249 Family history of ischemic heart disease and other diseases of the circulatory system: Secondary | ICD-10-CM | POA: Diagnosis not present

## 2016-11-19 DIAGNOSIS — E669 Obesity, unspecified: Secondary | ICD-10-CM | POA: Diagnosis not present

## 2016-11-19 DIAGNOSIS — Q402 Other specified congenital malformations of stomach: Secondary | ICD-10-CM | POA: Diagnosis not present

## 2016-11-19 DIAGNOSIS — M19041 Primary osteoarthritis, right hand: Secondary | ICD-10-CM | POA: Diagnosis not present

## 2016-11-19 DIAGNOSIS — E785 Hyperlipidemia, unspecified: Secondary | ICD-10-CM | POA: Diagnosis not present

## 2016-11-19 DIAGNOSIS — G4730 Sleep apnea, unspecified: Secondary | ICD-10-CM | POA: Insufficient documentation

## 2016-11-19 DIAGNOSIS — N182 Chronic kidney disease, stage 2 (mild): Secondary | ICD-10-CM | POA: Diagnosis not present

## 2016-11-19 DIAGNOSIS — Z841 Family history of disorders of kidney and ureter: Secondary | ICD-10-CM | POA: Diagnosis not present

## 2016-11-19 DIAGNOSIS — K3189 Other diseases of stomach and duodenum: Secondary | ICD-10-CM | POA: Diagnosis not present

## 2016-11-19 HISTORY — DX: Unspecified osteoarthritis, unspecified site: M19.90

## 2016-11-19 HISTORY — PX: ESOPHAGOGASTRODUODENOSCOPY (EGD) WITH PROPOFOL: SHX5813

## 2016-11-19 HISTORY — DX: Motion sickness, initial encounter: T75.3XXA

## 2016-11-19 HISTORY — PX: COLONOSCOPY WITH PROPOFOL: SHX5780

## 2016-11-19 SURGERY — COLONOSCOPY WITH PROPOFOL
Anesthesia: General | Wound class: Contaminated

## 2016-11-19 MED ORDER — GLYCOPYRROLATE 0.2 MG/ML IJ SOLN
INTRAMUSCULAR | Status: DC | PRN
  Administered 2016-11-19: 0.1 mg via INTRAVENOUS

## 2016-11-19 MED ORDER — ACETAMINOPHEN 325 MG PO TABS: 325.0000 mg | ORAL_TABLET | ORAL | Status: DC | PRN

## 2016-11-19 MED ORDER — PROPOFOL 10 MG/ML IV BOLUS
INTRAVENOUS | Status: DC | PRN
  Administered 2016-11-19: 40 mg via INTRAVENOUS
  Administered 2016-11-19 (×5): 20 mg via INTRAVENOUS
  Administered 2016-11-19: 80 mg via INTRAVENOUS

## 2016-11-19 MED ORDER — SIMETHICONE 40 MG/0.6ML PO SUSP
ORAL | Status: DC | PRN
  Administered 2016-11-19: 10:00:00

## 2016-11-19 MED ORDER — ACETAMINOPHEN 160 MG/5ML PO SOLN: 325.0000 mg | ORAL | Status: DC | PRN

## 2016-11-19 MED ORDER — LACTATED RINGERS IV SOLN
INTRAVENOUS | Status: DC
  Administered 2016-11-19 (×2): via INTRAVENOUS

## 2016-11-19 MED ORDER — LIDOCAINE HCL (CARDIAC) 20 MG/ML IV SOLN
INTRAVENOUS | Status: DC | PRN
  Administered 2016-11-19: 30 mg via INTRAVENOUS

## 2016-11-19 SURGICAL SUPPLY — 35 items

## 2016-11-19 NOTE — Op Note (Signed)
[facility] G[facility]enter[facility] Patient Name: Marissa Nelson Procedure Date: 11/19/2016 9:33 AM MRN: [id number] Account #: [id number] Date of Birth: [birth year] Admit Type: Ambulatory Age: 66 Room: [facility] OR ROOM 01 Gender: Female Note Status: Finalized Procedure:            Colonoscopy Indications:          Iron deficiency anemia Providers:            Jonathon Bellows MD, MD Medicines:            Monitored Anesthesia Care Complications:        No immediate complications. Procedure:            Pre-Anesthesia Assessment:                       - Prior to the procedure, a History and Physical was                        performed, and patient medications, allergies and                        sensitivities were reviewed. The patient's tolerance of                        previous anesthesia was reviewed.                       - The risks and benefits of the procedure and the                        sedation options and risks were discussed with the                        patient. All questions were answered and informed                        consent was obtained.                       - ASA Grade Assessment: II - A patient with mild                        systemic disease.                       After obtaining informed consent, the colonoscope was                        passed under direct vision. Throughout the procedure,                        the patient's blood pressure, pulse, and oxygen                        saturations were monitored continuously. The [place] [id number]) was introduced through the                        anus and advanced to the the cecum, identified by the  appendiceal orifice, IC valve and transillumination.                        The colonoscopy was performed without difficulty. Findings:      The perianal and digital rectal examinations were normal.      Non-bleeding internal hemorrhoids  were found during retroflexion. The       hemorrhoids were medium-sized and Grade I (internal hemorrhoids that do       not prolapse).      The exam was otherwise without abnormality on direct and retroflexion       views. Impression:           - Non-bleeding internal hemorrhoids.                       - The examination was otherwise normal on direct and                        retroflexion views.                       - No specimens collected. Recommendation:       - Discharge patient to home (with escort).                       - Resume previous diet.                       - Continue present medications.                       - To visualize the small bowel, perform video capsule                        endoscopy in 2 weeks. Procedure Code(s):    --- Professional ---                       [phone], Colonoscopy, flexible; diagnostic, including                        collection of specimen(s) by brushing or washing, when                        performed (separate procedure) Diagnosis Code(s):    --- Professional ---                       K64.0, First degree hemorrhoids                       D50.9, Iron deficiency anemia, unspecified CPT copyright 2016 American Medical Association. All rights reserved. The codes documented in this report are preliminary and upon coder review may  be revised to meet current compliance requirements. Jonathon Bellows, MD Jonathon Bellows MD, MD 11/19/2016 9:52:08 AM This report has been signed electronically. Number of Addenda: 0 Note Initiated On: 11/19/2016 9:33 AM Scope Withdrawal Time: 0 hours 9 minutes 47 seconds  Total Procedure Duration: 0 hours 11 minutes 45 seconds       [facility]

## 2016-11-19 NOTE — Anesthesia Postprocedure Evaluation (Signed)
Anesthesia Post Note  Patient: Marissa Nelson  Procedure(s) Performed: Procedure(s) (LRB): COLONOSCOPY WITH PROPOFOL (N/A) ESOPHAGOGASTRODUODENOSCOPY (EGD) WITH PROPOFOL (N/A)  Patient location during evaluation: PACU Anesthesia Type: General Level of consciousness: awake and alert and oriented Pain management: satisfactory to patient Vital Signs Assessment: post-procedure vital signs reviewed and stable Respiratory status: spontaneous breathing, nonlabored ventilation and respiratory function stable Cardiovascular status: blood pressure returned to baseline and stable Postop Assessment: Adequate PO intake and No signs of nausea or vomiting Anesthetic complications: no    Raliegh Ip

## 2016-11-19 NOTE — Anesthesia Procedure Notes (Signed)
Procedure Name: MAC Performed by: Crystalyn Delia Pre-anesthesia Checklist: Patient identified, Emergency Drugs available, Suction available, Patient being monitored and Timeout performed Patient Re-evaluated:Patient Re-evaluated prior to inductionOxygen Delivery Method: Nasal cannula       

## 2016-11-19 NOTE — Op Note (Signed)
[facility] Gastroenterology Patient Name: Marissa Nelson Procedure Date: 11/19/2016 9:19 AM MRN: [id number] Account #: [id number] Date of Birth: June 05, 1950 Admit Type: Outpatient Age: 66 Room: [facility] OR ROOM 01 Gender: Female Note Status: Finalized Procedure:            Upper GI endoscopy Indications:          Iron deficiency anemia Providers:            Jonathon Bellows MD, MD Referring MD:         Arnetha Courser (Referring MD) Medicines:            Monitored Anesthesia Care Complications:        No immediate complications. Procedure:            Pre-Anesthesia Assessment:                       - Prior to the procedure, a History and Physical was                        performed, and patient medications, allergies and                        sensitivities were reviewed. The patient's tolerance of                        previous anesthesia was reviewed.                       - The risks and benefits of the procedure and the                        sedation options and risks were discussed with the                        patient. All questions were answered and informed                        consent was obtained.                       - ASA Grade Assessment: III - A patient with severe                        systemic disease.                       After obtaining informed consent, the endoscope was                        passed under direct vision. Throughout the procedure,                        the patient's blood pressure, pulse, and oxygen                        saturations were monitored continuously. The Olympus                        190 Endoscope [id number]) was introduced through the  mouth, and advanced to the third part of duodenum. The                        upper GI endoscopy was accomplished with ease. The                        patient tolerated the procedure well. Findings:      The esophagus was normal.      The stomach was  normal.      Localized nodular mucosa was found in the duodenal bulb. Biopsies were       taken with a cold forceps for histology.      The exam was otherwise without abnormality. Impression:           - Normal esophagus.                       - Normal stomach.                       - Nodular mucosa in the duodenal bulb. Biopsied.                       - The examination was otherwise normal. Recommendation:       - Await pathology results.                       - Perform a colonoscopy today. Procedure Code(s):    --- Professional ---                       [phone], Esophagogastroduodenoscopy, flexible, transoral;                        with biopsy, single or multiple Diagnosis Code(s):    --- Professional ---                       K31.89, Other diseases of stomach and duodenum                       D50.9, Iron deficiency anemia, unspecified CPT copyright 2016 American Medical Association. All rights reserved. The codes documented in this report are preliminary and upon coder review may  be revised to meet current compliance requirements. Jonathon Bellows, MD Jonathon Bellows MD, MD 11/19/2016 9:33:16 AM This report has been signed electronically. Number of Addenda: 0 Note Initiated On: 11/19/2016 9:19 AM Total Procedure Duration: 0 hours 4 minutes 21 seconds       [facility]

## 2016-11-19 NOTE — Transfer of Care (Signed)
Immediate Anesthesia Transfer of Care Note  Patient: Marissa Nelson  Procedure(s) Performed: Procedure(s): COLONOSCOPY WITH PROPOFOL (N/A) ESOPHAGOGASTRODUODENOSCOPY (EGD) WITH PROPOFOL (N/A)  Patient Location: PACU  Anesthesia Type: General  Level of Consciousness: awake, alert  and patient cooperative  Airway and Oxygen Therapy: Patient Spontanous Breathing and Patient connected to supplemental oxygen  Post-op Assessment: Post-op Vital signs reviewed, Patient's Cardiovascular Status Stable, Respiratory Function Stable, Patent Airway and No signs of Nausea or vomiting  Post-op Vital Signs: Reviewed and stable  Complications: No apparent anesthesia complications

## 2016-11-19 NOTE — H&P (Signed)
Jonathon Bellows MD [address] [place], [place] [address] Phone: [phone] Fax : [phone]  Primary Care Physician:  Arnetha Courser, MD Primary Gastroenterologist:  Dr. Jonathon Bellows   Pre-Procedure History & Physical: HPI:  Marissa Nelson is a 66 y.o. female is here for an endoscopy and colonoscopy.   Past Medical History:  Diagnosis Date  . Arthritis    hands  . Chronic kidney disease, stage II (mild) 11/15/2015  . Hx of right breast biopsy 01/17/2016  . Hyperlipidemia   . Hypertension   . Motion sickness    ocean ships  . Obesity (BMI 30.0-34.9) 10/25/2015  . Prediabetes     Past Surgical History:  Procedure Laterality Date  . ABDOMINAL HYSTERECTOMY    . BREAST BIOPSY Right 01/15/2016   path pending    Prior to Admission medications   Medication Sig Start Date End Date Taking? Authorizing Provider  aspirin EC 81 MG tablet Take 1 tablet (81 mg total) by mouth daily. 11/15/15  Yes Lada, Satira Anis, MD  losartan-hydrochlorothiazide (HYZAAR) 100-25 MG tablet Take 1 tablet by mouth daily. 10/25/15  Yes Plonk, Gwyndolyn Saxon, MD  Vitamin D, Ergocalciferol, 2000 units CAPS Take by mouth daily.   Yes [provider]  Vitamin D, Ergocalciferol, (DRISDOL) 50000 units CAPS capsule Take 1 capsule (50,000 Units total) by mouth every 7 (seven) days. Patient not taking: Reported on 11/13/2016 10/03/16 11/28/16  Arnetha Courser, MD    Allergies as of 11/06/2016 - Review Complete 11/06/2016  Allergen Reaction Noted  . Penicillin g Rash 02/19/2015    Family History  Problem Relation Age of Onset  . Cancer Mother 43       liver cancer  . Diabetes Mother   . Hypertension Mother   . Glomerulonephritis Father   . Diabetes Sister   . Glomerulonephritis Sister   . Cancer Sister 57       throat cancer  . Heart disease Sister   . Diabetes Sister   . Diabetes Sister   . Varicose Veins Neg Hx     Social History   Social History  . Marital status: Married    Spouse  name: N/A  . Number of children: N/A  . Years of education: N/A   Occupational History  . Not on file.   Social History Main Topics  . Smoking status: Never Smoker  . Smokeless tobacco: Never Used  . Alcohol use No  . Drug use: No  . Sexual activity: Not Currently   Other Topics Concern  . Not on file   Social History Narrative  . No narrative on file    Review of Systems: See HPI, otherwise negative ROS  Physical Exam: BP (!) 150/81   Pulse 67   Temp 97.9 F (36.6 C) (Tympanic)   Resp 16   Ht 5\' 2"  (1.575 m)   Wt 190 lb (86.2 kg)   SpO2 100%   BMI 34.75 kg/m  General:   Alert,  pleasant and cooperative in NAD Head:  Normocephalic and atraumatic. Neck:  Supple; no masses or thyromegaly. Lungs:  Clear throughout to auscultation.    Heart:  Regular rate and rhythm. Abdomen:  Soft, nontender and nondistended. Normal bowel sounds, without guarding, and without rebound.   Neurologic:  Alert and  oriented x4;  grossly normal neurologically.  Impression/Plan: Marissa Nelson is here for an endoscopy and colonoscopy to be performed for iron deficiency anemia   Risks, benefits, limitations, and alternatives regarding  endoscopy and colonoscopy have been reviewed with the patient.  Questions have been answered.  All parties agreeable.   Jonathon Bellows, MD  11/19/2016, 9:10 AM

## 2016-11-19 NOTE — Anesthesia Preprocedure Evaluation (Signed)
Anesthesia Evaluation  Patient identified by MRN, date of birth, ID band Patient awake    Reviewed: Allergy & Precautions, H&P , NPO status , Patient's Chart, lab work & pertinent test results  Airway Mallampati: II  TM Distance: >3 FB Neck ROM: full    Dental no notable dental hx.    Pulmonary sleep apnea ,    Pulmonary exam normal breath sounds clear to auscultation       Cardiovascular hypertension, Normal cardiovascular exam Rhythm:regular Rate:Normal     Neuro/Psych    GI/Hepatic   Endo/Other    Renal/GU Renal disease     Musculoskeletal   Abdominal   Peds  Hematology   Anesthesia Other Findings   Reproductive/Obstetrics                             Anesthesia Physical Anesthesia Plan  ASA: II  Anesthesia Plan: General   Post-op Pain Management:    Induction:   PONV Risk Score and Plan: 3 and Propofol  Airway Management Planned:   Additional Equipment:   Intra-op Plan:   Post-operative Plan:   Informed Consent: I have reviewed the patients History and Physical, chart, labs and discussed the procedure including the risks, benefits and alternatives for the proposed anesthesia with the patient or authorized representative who has indicated his/her understanding and acceptance.     Plan Discussed with: CRNA  Anesthesia Plan Comments:         Anesthesia Quick Evaluation

## 2016-11-20 ENCOUNTER — Encounter: Payer: Self-pay | Admitting: Gastroenterology

## 2016-11-24 ENCOUNTER — Other Ambulatory Visit: Payer: Self-pay

## 2016-11-24 ENCOUNTER — Encounter: Payer: Self-pay | Admitting: Gastroenterology

## 2016-11-24 ENCOUNTER — Telehealth: Payer: Self-pay

## 2016-11-24 DIAGNOSIS — E538 Deficiency of other specified B group vitamins: Secondary | ICD-10-CM

## 2016-11-24 DIAGNOSIS — D509 Iron deficiency anemia, unspecified: Secondary | ICD-10-CM

## 2016-11-24 NOTE — Telephone Encounter (Signed)
-----   Message from Jonathon Bellows, MD sent at 11/23/2016 10:59 AM EDT ----- Inform b12 borderline low , no iron studies available , cant find it.  Plan  1. Get iron studies 2. urineanalysis 3. Methylmalonic acid, homocysteine levels, antiparietal cell antibody, intrinsicfactor antibody

## 2016-11-24 NOTE — Telephone Encounter (Signed)
Advised patient of results per Dr. Vicente Males. Placed orders for labs.   1. Get iron studies  2. urineanalysis 3. Methylmalonic acid, homocysteine levels, antiparietal cell antibody, intrinsicfactor antibody

## 2016-11-25 ENCOUNTER — Telehealth: Payer: Self-pay

## 2016-11-25 ENCOUNTER — Telehealth: Payer: Self-pay | Admitting: Gastroenterology

## 2016-11-25 ENCOUNTER — Other Ambulatory Visit: Payer: Self-pay

## 2016-11-25 DIAGNOSIS — D500 Iron deficiency anemia secondary to blood loss (chronic): Secondary | ICD-10-CM

## 2016-11-25 NOTE — Telephone Encounter (Signed)
Patient is returning your call for results °

## 2016-11-25 NOTE — Telephone Encounter (Signed)
Advised pt callback to schedule Capsule Study per Dr. Vicente Males.

## 2016-11-30 ENCOUNTER — Encounter: Payer: Self-pay | Admitting: Gastroenterology

## 2016-12-09 ENCOUNTER — Telehealth: Payer: Self-pay

## 2016-12-09 NOTE — Telephone Encounter (Signed)
Patient has been scheduled for a sleep study on 01/09/17 with [facility].

## 2016-12-11 ENCOUNTER — Telehealth: Payer: Self-pay

## 2016-12-11 NOTE — Telephone Encounter (Signed)
Advised patient the insurance denied capsule study per Traci T.  Contacting insurance.

## 2016-12-12 ENCOUNTER — Ambulatory Visit
Admission: RE | Admit: 2016-12-12 | Discharge: 2016-12-12 | Disposition: A | Discharge status: Home / Self Care | Payer: BC Managed Care – PPO | Source: Ambulatory Visit | Attending: Gastroenterology | Admitting: Gastroenterology

## 2016-12-12 ENCOUNTER — Encounter
Admission: RE | Disposition: A | Discharge status: Home / Self Care | Payer: Self-pay | Source: Ambulatory Visit | Attending: Gastroenterology

## 2016-12-12 DIAGNOSIS — K2960 Other gastritis without bleeding: Secondary | ICD-10-CM | POA: Diagnosis not present

## 2016-12-12 DIAGNOSIS — D649 Anemia, unspecified: Secondary | ICD-10-CM | POA: Diagnosis not present

## 2016-12-12 DIAGNOSIS — R1013 Epigastric pain: Secondary | ICD-10-CM | POA: Diagnosis not present

## 2016-12-12 HISTORY — PX: GIVENS CAPSULE STUDY: SHX5432

## 2016-12-12 SURGERY — IMAGING PROCEDURE, GI TRACT, INTRALUMINAL, VIA CAPSULE

## 2016-12-15 ENCOUNTER — Encounter: Payer: Self-pay | Admitting: Gastroenterology

## 2016-12-16 ENCOUNTER — Ambulatory Visit
Admission: RE | Admit: 2016-12-16 | Discharge: 2016-12-16 | Disposition: A | Discharge status: Home / Self Care | Payer: BC Managed Care – PPO | Source: Ambulatory Visit | Attending: Family Medicine | Admitting: Family Medicine

## 2016-12-16 DIAGNOSIS — R928 Other abnormal and inconclusive findings on diagnostic imaging of breast: Secondary | ICD-10-CM | POA: Diagnosis present

## 2016-12-16 DIAGNOSIS — Z1239 Encounter for other screening for malignant neoplasm of breast: Secondary | ICD-10-CM

## 2016-12-16 DIAGNOSIS — R921 Mammographic calcification found on diagnostic imaging of breast: Secondary | ICD-10-CM | POA: Insufficient documentation

## 2016-12-17 ENCOUNTER — Other Ambulatory Visit: Payer: Self-pay | Admitting: Family Medicine

## 2016-12-17 DIAGNOSIS — R928 Other abnormal and inconclusive findings on diagnostic imaging of breast: Secondary | ICD-10-CM

## 2016-12-17 DIAGNOSIS — R921 Mammographic calcification found on diagnostic imaging of breast: Secondary | ICD-10-CM

## 2016-12-23 ENCOUNTER — Ambulatory Visit: Payer: BC Managed Care – PPO

## 2016-12-23 ENCOUNTER — Ambulatory Visit: Admission: RE | Admit: 2016-12-23 | Payer: BC Managed Care – PPO | Source: Ambulatory Visit

## 2016-12-29 DIAGNOSIS — D0511 Intraductal carcinoma in situ of right breast: Secondary | ICD-10-CM | POA: Diagnosis not present

## 2016-12-30 ENCOUNTER — Telehealth: Payer: Self-pay | Admitting: Family Medicine

## 2016-12-30 ENCOUNTER — Ambulatory Visit
Admission: RE | Admit: 2016-12-30 | Discharge: 2016-12-30 | Disposition: A | Discharge status: Home / Self Care | Payer: BC Managed Care – PPO | Source: Ambulatory Visit | Attending: Family Medicine | Admitting: Family Medicine

## 2016-12-30 DIAGNOSIS — R928 Other abnormal and inconclusive findings on diagnostic imaging of breast: Secondary | ICD-10-CM | POA: Diagnosis not present

## 2016-12-30 DIAGNOSIS — D0511 Intraductal carcinoma in situ of right breast: Secondary | ICD-10-CM | POA: Diagnosis not present

## 2016-12-30 DIAGNOSIS — R921 Mammographic calcification found on diagnostic imaging of breast: Secondary | ICD-10-CM

## 2016-12-30 HISTORY — PX: BREAST BIOPSY: SHX20

## 2016-12-30 NOTE — Telephone Encounter (Signed)
The doctor who did her procedure should be able to provide that for her We hope the results come back with good news

## 2016-12-30 NOTE — Telephone Encounter (Signed)
Pt verbally informed. °

## 2016-12-30 NOTE — Telephone Encounter (Signed)
Pt had biopsy done today at [facility] breast center. She is requesting a note for work asking that she be off a couple days. Please return call to discuss further [phone]

## 2016-12-31 ENCOUNTER — Other Ambulatory Visit: Payer: Self-pay | Admitting: Family Medicine

## 2016-12-31 ENCOUNTER — Encounter: Payer: Self-pay | Admitting: Family Medicine

## 2016-12-31 DIAGNOSIS — R921 Mammographic calcification found on diagnostic imaging of breast: Secondary | ICD-10-CM | POA: Diagnosis not present

## 2016-12-31 DIAGNOSIS — R928 Other abnormal and inconclusive findings on diagnostic imaging of breast: Secondary | ICD-10-CM

## 2016-12-31 DIAGNOSIS — D0510 Intraductal carcinoma in situ of unspecified breast: Secondary | ICD-10-CM

## 2016-12-31 HISTORY — DX: Intraductal carcinoma in situ of unspecified breast: D05.10

## 2017-01-01 LAB — SURGICAL PATHOLOGY

## 2017-01-02 ENCOUNTER — Telehealth: Payer: Self-pay | Admitting: Family Medicine

## 2017-01-02 NOTE — Telephone Encounter (Signed)
I appreciate the update; let patient know I was thinking about her; here for her

## 2017-01-02 NOTE — Progress Notes (Signed)
  Oncology Nurse Navigator Documentation  Navigator Location: [facility] (01/02/17 1000)   )Navigator Encounter Type: Introductory phone call (01/02/17 1000)   Abnormal Finding Date: 12/30/16 (01/02/17 1000) Confirmed Diagnosis Date: 12/31/16 (01/02/17 1000)               Patient Visit Type: Initial (01/02/17 1000) Treatment Phase: Pre-Tx/Tx Discussion (01/02/17 1000) Barriers/Navigation Needs: Coordination of Care;Education (01/02/17 1000) Education: Accessing Care/ Finding Providers;Coping with Diagnosis/ Prognosis (01/02/17 1000) Interventions: Coordination of Care (01/02/17 1000)   Coordination of Care: Appts (01/02/17 1000)                  Time Spent with Patient: 60 (01/02/17 1000)  Introduced patient to IT trainer. Worked with her to schedule surgical, and oncology appointments.  She is scheduled to see Dr. Tama High at [facility] on 9/5 and Dr. Janese Banks , Medical Oncologist on 9/11.  She is also scheduled for another breast biopsy on 01/08/17.  Confirmed appointment information with patient.  Notified Dr. Delight Ovens office.

## 2017-01-02 NOTE — Telephone Encounter (Signed)
Al Pimple from [facility] (nurse navigator) states pt is positive for DCIS and appointment has been made for oncology and surgery Dr Jacklynn Bue on Sept 11, 2018  Dr Rosana Hoes on Sept 5, 2018 [facility]) Having another biopsy on the 6th due to them seeing another spot

## 2017-01-07 ENCOUNTER — Encounter: Payer: Self-pay | Admitting: Surgery

## 2017-01-07 ENCOUNTER — Ambulatory Visit (INDEPENDENT_AMBULATORY_CARE_PROVIDER_SITE_OTHER): Payer: BC Managed Care – PPO | Admitting: Surgery

## 2017-01-07 VITALS — BP 165/93 | HR 73 | Temp 97.8°F | Ht 64.0 in | Wt 193.0 lb

## 2017-01-07 DIAGNOSIS — D0511 Intraductal carcinoma in situ of right breast: Secondary | ICD-10-CM | POA: Diagnosis not present

## 2017-01-07 NOTE — Progress Notes (Signed)
Surgical Clinic History and Physical  Referring provider:  Arnetha Courser, MD [address] [place], [place] [address]  HISTORY OF PRESENT ILLNESS (HPI):  66 y.o. female presents for evaluation of Right breast upper outer quadrant DCIS identified as calcifications on recent annual screening mammogram (8/14) with subsequent core needle biopsy (8/28). Patient reports she performs daily self-exams and has been unable to palpate any mass(es). She previously underwent Right breast core needle biopsy (01/15/2016) with no abnormal findings and otherwise denies any breast pain (except post-biopsy), nipple discharge, weight loss, fever/chills, N/V, CP, or SOB. She first experienced menstruation at 66 years old, has been pregnant twice with delivery of 2 boys, did not breast feed either child, and underwent hysterectomy with B/L salpingo-oophorectomy in 1988 for contraception.  Of note, additional calcifications were noticed upon review of her mammogram, and she is scheduled for core needle biopsy of these additional subareolar calcifications tomorrow, though patient questions why she needs an additional biopsy.  PAST MEDICAL HISTORY (PMH):  Past Medical History:  Diagnosis Date  . Abnormal mammogram of right breast 01/17/2016   Recommendation: Six month follow-up diagnostic mammogram of the right breast for an additional group of probably benign Calcifications. Sept 2017 --> due March 2018  . Arthralgia 10/04/2016  . Arthritis    hands  . Benign essential HTN 12/07/2006  . Breast cancer screening 11/15/2015  . Chronic kidney disease, stage II (mild) 11/15/2015  . Chronic left shoulder pain 11/08/2014  . 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 Family history of diabetes mellitus 06/12/2016   Mother and several other relatives  . HLD (hyperlipidemia) 06/15/2015  . Hx of right breast biopsy 01/17/2016  . Hyperlipidemia   . Hypertension   . Medication monitoring  encounter 11/15/2015  . Motion sickness    ocean ships  . Numbness and tingling in left hand 11/07/2014  . Obesity 10/25/2015  . Obesity (BMI 30.0-34.9) 10/25/2015  . Obstructive apnea 06/15/2015  . Positive ANA (antinuclear antibody) 10/04/2016  . Prediabetes   . Preventative health care 11/15/2015  . Vitamin D deficiency 10/29/2015     PAST SURGICAL HISTORY [facility]):  Past Surgical History:  Procedure Laterality Date  . ABDOMINAL HYSTERECTOMY    . BREAST BIOPSY Right 01/15/2016   neg- core  . BREAST BIOPSY Right 12/30/2016   stereo bx for calcifications: DCIS  . COLONOSCOPY WITH PROPOFOL N/A 11/19/2016   Procedure: COLONOSCOPY WITH PROPOFOL;  Surgeon: Jonathon Bellows, MD;  Location: [facility];  Service: Endoscopy;  Laterality: N/A;  . ESOPHAGOGASTRODUODENOSCOPY (EGD) WITH PROPOFOL N/A 11/19/2016   Procedure: ESOPHAGOGASTRODUODENOSCOPY (EGD) WITH PROPOFOL;  Surgeon: Jonathon Bellows, MD;  Location: [facility];  Service: Endoscopy;  Laterality: N/A;  . GIVENS CAPSULE STUDY N/A 12/12/2016   Procedure: GIVENS CAPSULE STUDY;  Surgeon: Jonathon Bellows, MD;  Location: [facility] ENDOSCOPY;  Service: Gastroenterology;  Laterality: N/A;     MEDICATIONS:  Prior to Admission medications   Medication Sig Start Date End Date Taking? Authorizing Provider  aspirin EC 81 MG tablet Take 1 tablet (81 mg total) by mouth daily. 11/15/15  Yes Lada, Satira Anis, MD  losartan-hydrochlorothiazide (HYZAAR) 100-25 MG tablet Take 1 tablet by mouth daily. 10/25/15  Yes Plonk, Gwyndolyn Saxon, MD  Vitamin D, Ergocalciferol, 2000 units CAPS Take by mouth daily.   Yes [provider]     ALLERGIES:  Allergies  Allergen Reactions  . Shellfish Allergy Swelling    angioedema  . Penicillin  G Rash     SOCIAL HISTORY:  Social History   Social History  . Marital status: Married    Spouse name: N/A  . Number of children: 2  . Years of education: N/A   Occupational History  . Works in [employer] ([place], [place])  [employer]  . Smoking status: Never Smoker  . Smokeless tobacco: Never Used  . Alcohol use No  . Drug use: No  . Sexual activity: Not Currently   Other Topics Concern  . Not on file   Social History Narrative  . No narrative on file    The patient currently resides (home / rehab facility / nursing home): Home The patient normally is (ambulatory / bedbound): Ambulatory  FAMILY HISTORY:  Family History  Problem Relation Age of Onset  . Cancer Mother 72       liver cancer  . Diabetes Mother   . Hypertension Mother   . Glomerulonephritis Father   . Diabetes Sister   . Glomerulonephritis Sister   . Cancer Sister 77       throat cancer  . Heart disease Sister   . Diabetes Sister   . Diabetes Sister   . Breast cancer Cousin   . Varicose Veins Neg Hx     Otherwise negative/non-contributory.  REVIEW OF SYSTEMS:  Constitutional: denies any other weight loss, fever, chills, or sweats  Eyes: denies any other vision changes, history of eye injury  ENT: denies sore throat, hearing problems  Respiratory: denies shortness of breath, wheezing  Cardiovascular: denies chest pain, palpitations Breast: pain, nipple discharge, and mass(es) as per HPI Gastrointestinal: denies abdominal pain, N/V, or diarrhea Musculoskeletal: denies any other joint pains or cramps  Skin: Denies any other rashes or skin discolorations Neurological: denies any other headache, dizziness, weakness  Psychiatric: Denies any other depression, anxiety   All other review of systems were otherwise negative   VITAL SIGNS:  BP (!) 165/93   Pulse 73   Temp 97.8 F (36.6 C) (Oral)   Ht 5\' 4"  (1.626 m)   Wt 193 lb (87.5 kg)   BMI 33.13 kg/m   PHYSICAL EXAM:  Constitutional:  -- Obese body habitus  -- Awake, alert, and oriented x3  Eyes:  -- Pupils equally round and reactive to light  -- No scleral icterus  Ear, nose, throat:  -- No jugular venous  distension -- No nasal drainage, bleeding Pulmonary:  -- No crackles  -- Equal breath sounds bilaterally -- Breathing non-labored at rest Cardiovascular:  -- S1, S2 present  -- No pericardial rubs Breast: -- Right upper mid-breast (supra-areolar) punctate wound and RUO quadrant tenderness to palpation, both consistent with recent biopsy, B/L no palpable masses or nipple discharge and B/L no appreciably palpable axillary lymphadenopathy Gastrointestinal:  -- Abdomen soft, nontender, nondistended, no guarding/rebound  -- No abdominal masses appreciated, pulsatile or otherwise  Musculoskeletal and Integumentary:  -- Wounds or skin discoloration: None appreciated except Right breast as above (Breast) -- Extremities: B/L UE and LE FROM, hands and feet warm, no edema  Neurologic:  -- Motor function: Intact and symmetric -- Sensation: Intact and symmetric  Labs:  CBC Latest Ref Rng & Units 10/02/2016  WBC 3.8 - 10.8 K/uL 6.1  Hemoglobin 11.7 - 15.5 g/dL 11.5(L)  Hematocrit 35.0 - 45.0 % 35.3  Platelets 140 - 400 K/uL 243   CMP Latest Ref Rng & Units 11/06/2016 10/02/2016 06/16/2016  Glucose 65 - 99 mg/dL -  84 100(H)  BUN 7 - 25 mg/dL - 16 14  Creatinine 0.50 - 0.99 mg/dL - 1.10(H) 0.98  Sodium 135 - 146 mmol/L - 141 141  Potassium 3.5 - 5.3 mmol/L - 4.4 4.7  Chloride 98 - 110 mmol/L - 106 106  CO2 20 - 31 mmol/L - 24 29  Calcium 8.6 - 10.4 mg/dL - 9.4 9.5  Total Protein 6.5 - 8.1 g/dL 7.6 - 6.8  Total Bilirubin 0.3 - 1.2 mg/dL 0.4 - 0.3  Alkaline Phos 38 - 126 U/L 77 - 76  AST 15 - 41 U/L 22 - 14  ALT 14 - 54 U/L 20 - 11    Imaging studies:  Right Breast Stereotactic Core Needle Biopsy (12/30/2016 with addendum 12/31/2016) ADDENDUM:  A. BREAST, RIGHT, UPPER OUTER QUADRANT; STEREOTACTIC-GUIDED CORE BIOPSY: - DUCTAL CARCINOMA IN SITU (DCIS), INTERMEDIATE NUCLEAR GRADE WITH COMEDONECROSIS, ASSOCIATED WITH CALCIFICATIONS.  In this sample DCIS is in multiple core fragments and  spans up to 5 mm. No invasive carcinoma is identified.  CONCORDANT: YES I telephoned the patient on 12/31/2016 at approximately 1:15 p.m. and discussed these results and the recommendations stated below. All questions were answered. The patient was asked to call the [facility] with any questions or issues related to the biopsy.  RECOMMENDATION: 1. Surgical consultation is recommended. The patient will be contacted by one of the nurse navigators with the referral. 2. Stereotactic guided biopsy of the additional 5 mm group of calcifications within the central, subareolar right breast, middle depth. The patient will be contacted by the breast center to schedule this biopsy.  Bilateral Diagnostic Breast Mammography (12/16/2016) Mammographically, there are no suspicious masses or areas of architectural distortion in either breast. There is a stable group of benign-appearing calcifications in the right breast upper outer quadrant, middle depth, measuring 5 mm. However there is a group of newly apparent calcifications slightly more anteriorly in the upper-outer quadrant of the right breast which measures 12 x 15 x 13 mm. There is no associated mass. Post biopsy changes with cylinder marker is seen at the site of prior benign stereotactic core needle biopsy.  IMPRESSION: BI-RADS CATEGORY 4: Suspicious  Right breast upper outer quadrant, anterior depth, 15 mm of indeterminate calcifications, for which stereotactic core needle biopsy is recommended.  Right breast upper outer quadrant 5 mm stable probably benign calcifications. Continued six-month follow-up is recommended for this group.  Assessment/Plan:  66 y.o. female with asymptomatic Right breast upper outer quadrant DCIS identified as calcifications on recent annual screening mammogram (8/14) with subsequent core needle biopsy (2/22), complicated by co-morbidities including obesity (BMI >33), pre-diabetes, HTN, HLD,  CKD (stage 2), OSA, osteoarthritis, and positive ANA (antinuclear antibody).   - imaging and biopsy results discussed, along with indication for 2nd biopsy tomorrow  - all risks, benefits, and alternatives to lumpectomy/partial mastectomy without SLN biopsy, possible SLN biopsy and possible mastectomy (pending pathology results of 2nd biopsy tomorrow) were discussed with the patient, all of her questions were answered to her expressed satisfaction, patient expresses she wishes to proceed, and informed consent was obtained.   - will schedule for needle/wire-localized excisional biopsy of Right breast upper outer quadrant calcifications without SLN biopsy on 9/28 per patient request with patient expressing understanding will call to update with pathology prior to surgery and may need to ammend surgical plan (+/- SLN biopsy, +/- Right mastectomy) on basis of additional biopsy pathology   - outpatient surgical follow-up 2 weeks following above planned procedure  -  instructed patient to call if any questions or concerns  All of the above recommendations were discussed with the patient, and all of patient's questions were answered to her expressed satisfaction.  Thank you for the opportunity to participate in this patient's care.  -- Marilynne Drivers Rosana Hoes, MD, [place]: [facility] - Partnering for exceptional care. Office: [phone]

## 2017-01-07 NOTE — Patient Instructions (Addendum)
Lumpectomy, Care After This sheet gives you information about how to care for yourself after your procedure. Your health care provider may also give you more specific instructions. If you have problems or questions, contact your health care provider. What can I expect after the procedure? After the procedure, it is common to have:  Breast swelling.  Breast tenderness.  Stiffness in your arm or shoulder.  A change in the shape and feel of your breast.  Scar tissue that feels hard to the touch in the area where the lump was removed.  Follow these instructions at home: Bathing  Take sponge baths until your health care provider says that you can start showering or bathing.  Do not take baths, swim, or use a hot tub until your health care provider approves. Incision care  Follow instructions from your health care provider about how to take care of your incision. Make sure you: ? Wash your hands with soap and water before you change your bandage (dressing). If soap and water are not available, use hand sanitizer. ? Change your dressing as told by your health care provider. ? Leave stitches (sutures), skin glue, or adhesive strips in place. These skin closures may need to stay in place for 2 weeks or longer. If adhesive strip edges start to loosen and curl up, you may trim the loose edges. Do not remove adhesive strips completely unless your health care provider tells you to do that.  Check your incision area every day for signs of infection. Check for: ? More redness, swelling, or pain. ? More fluid or blood. ? Warmth. ? Pus or a bad smell.  Keep your dressing clean and dry.  If you were sent home with a surgical drain in place, follow instructions from your health care provider about emptying it. Activity  Return to your normal activities as told by your health care provider. Ask your health care provider what activities are safe for you.  Avoid activities that require a lot of  energy (are strenuous).  Be careful to avoid any activities that could cause an injury to your arm on the side of your surgery.  Do not lift anything that is heavier than 10 lb (4.5 kg). Avoid lifting with the arm that is on the side of your surgery.  Do not carry heavy objects on your shoulder.  After your drain is removed, you should perform exercises to keep your arm from getting stiff and swollen. Talk with your health care provider about which exercises are safe for you. General instructions  Take over-the-counter and prescription medicines only as told by your health care provider.  You may eat what you usually do.  Wear a supportive bra as told by your health care provider.  Raise (elevate) your arm above the level of your heart while you are sitting or lying down.  Do not wear tight jewelry on your arm, wrist, or fingers on the side of your surgery. Follow-up  Keep all follow-up visits as told by your health care provider. This is important.  You may need to be screened for extra fluid around the lymph nodes (lymphedema). Follow instructions from your health care provider about how often you should be checked.  If you had any lymph nodes removed during your procedure, be sure to tell all of your health care providers. This is important information to share before you are involved in certain procedures, such as giving blood or having your blood pressure taken. Contact a health care  provider if:  You develop a rash.  You have a fever.  Your pain medicine is not working.  Your swelling, weakness, or numbness in your arm has not improved after a few weeks.  You have new swelling in your breast or arm.  You have more redness, swelling, or pain in your incision area.  You have more fluid or blood coming from your incision.  Your incision feels warm to the touch.  You have pus or a bad smell coming from your incision. Get help right away if:  You have very bad pain in  your breast or arm.  You have chest pain.  You have difficulty breathing. This information is not intended to replace advice given to you by your health care provider. Make sure you discuss any questions you have with your health care provider. Document Released: 05/07/2006 Document Revised: 01/02/2016 Document Reviewed: 01/02/2016 Elsevier Interactive Patient Education  2018 [place] Total or Modified Radical Mastectomy, Care After Refer to this sheet in the next few weeks. These instructions provide you with information about caring for yourself after your procedure. Your health care provider may also give you more specific instructions. Your treatment has been planned according to current medical practices, but problems sometimes occur. Call your health care provider if you have any problems or questions after your procedure. What can I expect after the procedure? After your procedure, it is common to have:  Pain.  Numbness.  Stiffness in your arm or shoulder.  Feelings of stress, sadness, or depression.  If the lymph nodes under your arm were removed, you may have arm swelling, weakness, or numbness on the same side of your body as your surgery. Follow these instructions at home: Incision care  There are many different ways to close and cover an incision, including stitches, skin glue, and adhesive strips. Follow your health care provider's instructions about: ? Incision care. ? Bandage (dressing) changes and removal. ? Incision closure removal.  Check your incision area every day for signs of infection. Watch for: ? Redness, swelling, or pain. ? Fluid, blood, or pus.  If you were sent home with a surgical drain in place, follow your health care provider's instructions for emptying it. Bathing  Do not take baths, swim, or use a hot tub until your health care provider approves.  Take sponge baths until your health care provider says that you can start  showering or bathing. Activity  Return to your normal activities as directed by your health care provider.  Avoid strenuous exercise.  Be careful to avoid any activities that could cause an injury to your arm on the side of your surgery.  Do not lift anything that is heavier than 10 lb (4.5 kg). Avoid lifting with the arm that is on the side of your surgery.  Do not carry heavy objects on your shoulder.  After your drain is removed, you should perform exercises to keep your arm from getting stiff and swollen. Talk with your health care provider about which exercises are safe for you. General instructions  Take medicines only as directed by your health care provider.  You may eat what you usually do.  Keep your arm elevated when at rest.  Do not wear tight jewelry on your arm, wrist, or fingers on the side of your surgery.  Get checked for extra fluid around your lymph nodes (lymphedema) as often as told by your health care provider.  If you had a modified radical  mastectomy, always let your health care providers know that lymph nodes under your arm were removed. This is important information to share before you are involved in certain procedures, such as giving blood or having your blood pressure taken. Contact a health care provider if:  You have a fever.  Your pain medicine is not working.  Your arm swelling, weakness, or numbness has not improved after a few weeks.  You have new swelling in your breast or arm.  You have redness, swelling, or pain in your incision area.  You have fluid, blood, or pus coming from your incision. Get help right away if:  You have very bad pain in your breast or arm.  You have chest pain.  You have difficulty breathing. This information is not intended to replace advice given to you by your health care provider. Make sure you discuss any questions you have with your health care provider. Document Released: 12/13/2003 Document Revised:  12/27/2015 Document Reviewed: 01/04/2014 Elsevier Interactive Patient Education  2018 [employer].

## 2017-01-08 ENCOUNTER — Ambulatory Visit
Admission: RE | Admit: 2017-01-08 | Discharge: 2017-01-08 | Disposition: A | Discharge status: Home / Self Care | Payer: BC Managed Care – PPO | Source: Ambulatory Visit | Attending: Family Medicine | Admitting: Family Medicine

## 2017-01-08 ENCOUNTER — Other Ambulatory Visit: Payer: Self-pay

## 2017-01-08 DIAGNOSIS — R921 Mammographic calcification found on diagnostic imaging of breast: Secondary | ICD-10-CM

## 2017-01-08 DIAGNOSIS — R928 Other abnormal and inconclusive findings on diagnostic imaging of breast: Secondary | ICD-10-CM

## 2017-01-08 HISTORY — PX: BREAST BIOPSY: SHX20

## 2017-01-09 LAB — SURGICAL PATHOLOGY

## 2017-01-12 ENCOUNTER — Other Ambulatory Visit: Payer: Self-pay

## 2017-01-12 ENCOUNTER — Other Ambulatory Visit: Payer: Self-pay | Admitting: Surgery

## 2017-01-12 DIAGNOSIS — D0511 Intraductal carcinoma in situ of right breast: Secondary | ICD-10-CM

## 2017-01-13 ENCOUNTER — Encounter: Payer: Self-pay | Admitting: Oncology

## 2017-01-13 ENCOUNTER — Inpatient Hospital Stay: Payer: BC Managed Care – PPO | Attending: Oncology | Admitting: Oncology

## 2017-01-13 ENCOUNTER — Telehealth: Payer: Self-pay | Admitting: Oncology

## 2017-01-13 VITALS — BP 129/90 | HR 79 | Temp 96.2°F | Resp 18 | Ht 61.81 in | Wt 191.9 lb

## 2017-01-13 DIAGNOSIS — Z803 Family history of malignant neoplasm of breast: Secondary | ICD-10-CM | POA: Diagnosis not present

## 2017-01-13 DIAGNOSIS — D0511 Intraductal carcinoma in situ of right breast: Secondary | ICD-10-CM | POA: Insufficient documentation

## 2017-01-13 DIAGNOSIS — M1990 Unspecified osteoarthritis, unspecified site: Secondary | ICD-10-CM | POA: Insufficient documentation

## 2017-01-13 DIAGNOSIS — Z79899 Other long term (current) drug therapy: Secondary | ICD-10-CM | POA: Insufficient documentation

## 2017-01-13 DIAGNOSIS — Z90710 Acquired absence of both cervix and uterus: Secondary | ICD-10-CM | POA: Diagnosis not present

## 2017-01-13 DIAGNOSIS — Z808 Family history of malignant neoplasm of other organs or systems: Secondary | ICD-10-CM

## 2017-01-13 DIAGNOSIS — Z170 Estrogen receptor positive status [ER+]: Secondary | ICD-10-CM | POA: Insufficient documentation

## 2017-01-13 DIAGNOSIS — G8918 Other acute postprocedural pain: Secondary | ICD-10-CM | POA: Insufficient documentation

## 2017-01-13 DIAGNOSIS — I10 Essential (primary) hypertension: Secondary | ICD-10-CM | POA: Diagnosis not present

## 2017-01-13 DIAGNOSIS — E785 Hyperlipidemia, unspecified: Secondary | ICD-10-CM | POA: Insufficient documentation

## 2017-01-13 NOTE — Telephone Encounter (Signed)
Appt [facility] to accommodate Rad/Onc appt. Updated appts conf with patient. 01/13/17  MF

## 2017-01-13 NOTE — Progress Notes (Signed)
Here for new pt evaluation.  

## 2017-01-13 NOTE — Telephone Encounter (Signed)
L/M on Rad/Onc v/m for appt with Dr Baruch Gouty. Also with pt info so they can call pt with appt.  Instructed pt to call office on Friday if haven't heard from Rad/Onc with appt.

## 2017-01-14 ENCOUNTER — Encounter: Payer: Self-pay | Admitting: Oncology

## 2017-01-14 ENCOUNTER — Ambulatory Visit: Payer: BC Managed Care – PPO | Admitting: Gastroenterology

## 2017-01-14 NOTE — Progress Notes (Signed)
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 Sanda Klein  Date of visit: 01/14/17   History of presenting illness- 1. Patient is a 66 year old African-American female with no significant comorbidities other than hypertension. She underwent screening bilateral mammogram in August 2017 which showed calcifications in her right breast. This was followed by a diagnostic mammogram which showed 2 sets of calcifications: 1 was 4 mm in the upper outer right breast and an additional 7 mm calcification in the central subareolar right breast. The 4 mm group of calcifications was biopsies and was negative for malignancy. Repeat mammogram was recommended in 6 months. This mammogram showed a new 15 mm calcifications in the upper outer quadrant of the right breast measuring 12 x 15 x 13 mm. Stable group of benign-appearing calcifications in the right breast upper outer quadrant measuring 5 mm.  2. Patient underwent 2 sets of biopsies. Biopsies from the right upper outer quadrant showed intermediate grade DCIS with comedonecrosis and associated calcifications. ER greater than 90% positive and PR was 11-50% positive. Biopsy of the right lower quadrant of the right breast showed fibroadenomatous changes with calcifications.  3. Patient has been seen by Dr. Rosana Hoes and is scheduled to undergo lumpectomy on 01/30/2017  4. Patient had menarche at the age of 43. 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 History of breast cancer in one of her sisters. She is G2 P2 L2. She did not breast-feed.  5. Patient reports mild soreness at the site of  breast biopsy but denies any other complaints.  ECOG PS- 0  Pain scale- 0   Review of systems- Review of Systems  Constitutional: Negative for chills, fever, malaise/fatigue and weight loss.  HENT: Negative for congestion, ear discharge and nosebleeds.   Eyes: Negative for blurred vision.  Respiratory: Negative for cough, hemoptysis, sputum production, shortness of breath and wheezing.   Cardiovascular: Negative for chest pain, palpitations, orthopnea and claudication.  Gastrointestinal: Negative for abdominal pain, blood in stool, constipation, diarrhea, heartburn, melena, nausea and vomiting.  Genitourinary: Negative for dysuria, flank pain, frequency, hematuria and urgency.  Musculoskeletal: Negative for back pain, joint pain and myalgias.  Skin: Negative for rash.  Neurological: Negative for dizziness, tingling, focal weakness, seizures, weakness and headaches.  Endo/Heme/Allergies: Does not bruise/bleed easily.  Psychiatric/Behavioral: Negative for depression and suicidal ideas. The patient does not have insomnia.     Allergies  Allergen Reactions  . Shellfish Allergy Swelling    angioedema  . Penicillin G Rash    Patient Active Problem List   Diagnosis Date Noted  . 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 Positive ANA (antinuclear antibody) 10/04/2016  . Arthralgia 10/04/2016  . Prediabetes 06/20/2016  . Family history of diabetes mellitus 06/12/2016  . Hx of right breast biopsy 01/17/2016  . Abnormal mammogram of right breast 01/17/2016  . Breast cancer screening 11/15/2015  . Medication monitoring encounter 11/15/2015  . Preventative health care 11/15/2015  . Chronic kidney disease, stage II (mild) 11/15/2015  . Vitamin D deficiency 10/29/2015  . Obesity 10/25/2015  . HLD (hyperlipidemia) 06/15/2015  . Obstructive apnea 06/15/2015  . Chronic left shoulder pain  11/08/2014  . Numbness and tingling in left hand 11/07/2014  . Benign essential HTN  12/07/2006     Past Medical History:  Diagnosis Date  . Abnormal mammogram of right breast 01/17/2016   Recommendation: Six month follow-up diagnostic mammogram of the right breast for an additional group of probably benign Calcifications. Sept 2017 --> due March 2018  . Arthralgia 10/04/2016  . Arthritis    hands  . Benign essential HTN 12/07/2006  . Breast cancer screening 11/15/2015  . Chronic kidney disease, stage II (mild) 11/15/2015  . Chronic left shoulder pain 11/08/2014  . 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 Family history of diabetes mellitus 06/12/2016   Mother and several other relatives  . HLD (hyperlipidemia) 06/15/2015  . Hx of right breast biopsy 01/17/2016  . Hyperlipidemia   . Hypertension   . Medication monitoring encounter 11/15/2015  . Motion sickness    ocean ships  . Numbness and tingling in left hand 11/07/2014  . Obesity 10/25/2015  . Obesity (BMI 30.0-34.9) 10/25/2015  . Obstructive apnea 06/15/2015  . Positive ANA (antinuclear antibody) 10/04/2016  . Prediabetes   . Preventative health care 11/15/2015  . Vitamin D deficiency 10/29/2015     Past Surgical History:  Procedure Laterality Date  . ABDOMINAL HYSTERECTOMY    . BREAST BIOPSY Right 01/15/2016   neg- core  . BREAST BIOPSY Right 12/30/2016   stereo bx for calcifications: DCIS  . BREAST BIOPSY Right 01/08/2017   path pending  . COLONOSCOPY WITH PROPOFOL N/A 11/19/2016   Procedure: COLONOSCOPY WITH PROPOFOL;  Surgeon: Jonathon Bellows, MD;  Location: [place];  Service: Endoscopy;  Laterality: N/A;  . ESOPHAGOGASTRODUODENOSCOPY (EGD) WITH PROPOFOL N/A 11/19/2016   Procedure: ESOPHAGOGASTRODUODENOSCOPY (EGD) WITH PROPOFOL;  Surgeon: Jonathon Bellows, MD;  Location: [place];  Service: Endoscopy;  Laterality: N/A;  . GIVENS CAPSULE STUDY N/A 12/12/2016   Procedure: GIVENS CAPSULE STUDY;  Surgeon: Jonathon Bellows, MD;  Location: [facility] ENDOSCOPY;  Service:  Gastroenterology;  Laterality: N/A;    Social History   Social History  . Marital status: Married    Spouse name: N/A  . Number of children: 2  . Years of education: N/A   Occupational History  . Works in [employer] ([place], [place]) [place]  . Smoking status: Never Smoker  . Smokeless tobacco: Never Used  . Alcohol use No  . Drug use: No  . Sexual activity: Not Currently   Other Topics Concern  . Not on file   Social History Narrative  . No narrative on file     Family History  Problem Relation Age of Onset  . Cancer Mother 39       liver cancer  . Diabetes Mother   . Hypertension Mother   . Glomerulonephritis Father   . Diabetes Sister   . Glomerulonephritis Sister   . Cancer Sister 73       throat cancer  . Heart disease Sister   . Diabetes Sister   . Diabetes Sister   . COPD Sister   . Diabetes Sister   . Breast cancer Cousin        Currently undergoing systemic chemotherapy (01/2017)  . Varicose Veins Neg Hx      Current Outpatient Prescriptions:  .  acetaminophen (TYLENOL) 325 MG tablet, Take 650 mg by mouth every 6 (six) hours as needed., Disp: , Rfl:  .  aspirin EC 81 MG tablet, Take 1 tablet (81 mg total) by mouth daily., Disp: , Rfl:  .  losartan-hydrochlorothiazide (HYZAAR) 100-25 MG tablet, Take 1 tablet by mouth daily., Disp: 90 tablet, Rfl: 3 .  Vitamin D, Ergocalciferol, 2000 units CAPS, Take by mouth daily., Disp: , Rfl:    Physical exam:  Vitals:   01/13/17 1059  BP: 129/90  Pulse: 79  Resp: 18  Temp: (!) 96.2 F (35.7 C)  TempSrc: Tympanic  Weight: 191 lb 14.4 oz (87 kg)  Height: 5' 1.81" (1.57 m)   Physical Exam  Constitutional: She is oriented to person, place, and time and well-developed, well-nourished, and in no distress.  HENT:  Head: Normocephalic and atraumatic.  Eyes: Pupils are equal, round, and reactive to light. EOM are normal.  Neck: Normal range of motion.    Cardiovascular: Normal rate, regular rhythm and normal heart sounds.   Pulmonary/Chest: Effort normal and breath sounds normal.  Abdominal: Soft. Bowel sounds are normal.  Neurological: She is alert and oriented to person, place, and time.  Skin: Skin is warm and dry.    Breast exam was performed in seated and lying down position. No evidence of any palpable masses. No evidence of axillary adenopathy. No evidence of any palpable masses or lumps in the left breast. No evidence of leftt axillary adenopathy    CMP Latest Ref Rng & Units 11/06/2016  Glucose 65 - 99 mg/dL -  BUN 7 - 25 mg/dL -  Creatinine 0.50 - 0.99 mg/dL -  Sodium 135 - 146 mmol/L -  Potassium 3.5 - 5.3 mmol/L -  Chloride 98 - 110 mmol/L -  CO2 20 - 31 mmol/L -  Calcium 8.6 - 10.4 mg/dL -  Total Protein 6.5 - 8.1 g/dL 7.6  Total Bilirubin 0.3 - 1.2 mg/dL 0.4  Alkaline Phos 38 - 126 U/L 77  AST 15 - 41 U/L 22  ALT 14 - 54 U/L 20   CBC Latest Ref Rng & Units 10/02/2016  WBC 3.8 - 10.8 K/uL 6.1  Hemoglobin 11.7 - 15.5 g/dL 11.5(L)  Hematocrit 35.0 - 45.0 % 35.3  Platelets 140 - 400 K/uL 243    No images are attached to the encounter.  Mm Diag Breast Tomo Bilateral  Result Date: 12/16/2016 CLINICAL DATA:  The patient is post benign stereotactic core needle biopsy of right breast calcifications demonstrating fibroadenomatoid changes with stromal hyalinization.Follow-up of an additional group of calcifications in the right breast upper outer quadrant. EXAM: 2D DIGITAL DIAGNOSTIC BILATERAL MAMMOGRAM WITH CAD AND ADJUNCT TOMO COMPARISON:  Previous exam(s). ACR Breast Density Category b: There are scattered areas of fibroglandular density. FINDINGS: Mammographically, there are no suspicious masses or areas of architectural distortion in either breast. There is a stable group of benign-appearing calcifications in the right breast upper outer quadrant, middle depth, measuring 5 mm. However there is a group of newly apparent  calcifications slightly more anteriorly in the upper-outer quadrant of the right breast which measures 12 x 15 x 13 mm. There is no associated mass. Post biopsy changes with cylinder marker is seen at the site of prior benign stereotactic core needle biopsy. Mammographic images were processed with CAD. IMPRESSION: Right breast upper outer quadrant, anterior depth, 15 mm of indeterminate calcifications, for which stereotactic core needle biopsy is recommended. Right breast upper outer quadrant 5 mm stable probably benign calcifications. Continued six-month follow-up is recommended for this group. RECOMMENDATION: Stereotactic core needle biopsy of the right breast. Additional six-month follow-up of  the right breast for a previously noted probably benign group of calcifications. I have discussed the findings and recommendations with the patient. Results were also provided in writing at the conclusion of the visit. If applicable, a reminder letter will be sent to the patient regarding the next appointment. BI-RADS CATEGORY  4: Suspicious. Electronically Signed   By: Fidela Salisbury M.D.   On: 12/16/2016 14:30   Mm Clip Placement Right  Result Date: 01/08/2017 CLINICAL DATA:  Status post stereotactic guided right breast biopsy EXAM: DIAGNOSTIC RIGHT MAMMOGRAM POST STEREOTACTIC BIOPSY COMPARISON:  Previous exam(s). FINDINGS: Mammographic images were obtained following stereotactic guided biopsy of indeterminate right breast calcifications. Post biopsy mammogram demonstrates the X shaped biopsy marker to be in the expected location of the biopsied calcifications in the slightly lower, outer right breast. A few residual calcifications remain at the biopsy site. IMPRESSION: Appropriate marker position as above. Final Assessment: Post Procedure Mammograms for Marker Placement Electronically Signed   By: Pamelia Hoit M.D.   On: 01/08/2017 08:45   Mm Clip Placement Right  Result Date: 12/30/2016 CLINICAL DATA:  Patient  status post stereotactic guided biopsy right breast calcifications. EXAM: DIAGNOSTIC RIGHT MAMMOGRAM POST STEREOTACTIC BIOPSY COMPARISON:  Previous exam(s). FINDINGS: Mammographic images were obtained following stereotactic guided biopsy of right breast calcifications. Ribbon shaped marking clip in appropriate position within the upper-outer right breast anterior depth. IMPRESSION: Appropriate position ribbon shaped marking clip status post stereotactic guided biopsy right breast calcifications. Final Assessment: Post Procedure Mammograms for Marker Placement Electronically Signed   By: Lovey Newcomer M.D.   On: 12/30/2016 08:44   Mm Rt Breast Bx W Loc Dev 1st Lesion Image Bx Spec Stereo Guide  Addendum Date: 01/12/2017   ADDENDUM REPORT: 01/09/2017 14:46 ADDENDUM: Pathology of the right breast biopsy revealed A. RIGHT BREAST, LOWER OUTER QUADRANT; STEREOTACTIC BIOPSY: FIBROADENOMATOUS CHANGE WITH CALCIFICATIONS. This was found to be concordant with Dr. Raul Del impression and notes. Recommendation: Follow-up with Dr. Tama High and Dr. Janese Banks as planned for recently diagnosed DCIS right breast. At the patient's request, results and recommendations were relayed to the patient by phone by Jetta Lout, [place] on 01/08/17 at 2:00 PM. The patient stated she has done well following her biopsy with no bleeding or bruising. Post biopsy instructions were reviewed with the patient. She was encouraged to call the [facility] with any further questions or concerns. Addendum by Jetta Lout, RRA on 01/08/17. Electronically Signed   By: Pamelia Hoit M.D.   On: 01/09/2017 14:46   Result Date: 01/12/2017 CLINICAL DATA:  66 year old female with indeterminate right breast calcifications EXAM: RIGHT BREAST STEREOTACTIC CORE NEEDLE BIOPSY COMPARISON:  Previous exams. FINDINGS: The patient and I discussed the procedure of stereotactic-guided biopsy including benefits and alternatives. We discussed the high likelihood of a successful  procedure. We discussed the risks of the procedure including infection, bleeding, tissue injury, clip migration, and inadequate sampling. Informed written consent was given. The usual time out protocol was performed immediately prior to the procedure. Using sterile technique and 1% Lidocaine as local anesthetic, under stereotactic guidance, a 9 gauge vacuum assisted device was used to perform core needle biopsy of calcifications in the lower, outer quadrant using a superior to inferior approach. Specimen radiograph was performed showing calcifications within the specimen. Specimens with calcifications are identified for pathology. Lesion quadrant: Lower outer quadrant At the conclusion of the procedure, an X shaped tissue marker clip was deployed into the biopsy cavity. Follow-up 2-view mammogram was performed and dictated  separately. IMPRESSION: Stereotactic-guided biopsy of indeterminate right breast calcifications. No apparent complications. Electronically Signed: By: Pamelia Hoit M.D. On: 01/08/2017 08:33   Mm Rt Breast Bx W Loc Dev 1st Lesion Image Bx Spec Stereo Guide  Addendum Date: 12/31/2016   ADDENDUM REPORT: 12/31/2016 13:36 ADDENDUM: PATHOLOGY: A. BREAST, RIGHT, UPPER OUTER QUADRANT; STEREOTACTIC-GUIDED CORE BIOPSY: - DUCTAL CARCINOMA IN SITU (DCIS), INTERMEDIATE NUCLEAR GRADE WITH COMEDONECROSIS, ASSOCIATED WITH CALCIFICATIONS. Comment: In this sample DCIS is in multiple core fragments and spans up to 5 mm. No invasive carcinoma is identified. CONCORDANT: YES I telephoned the patient on 12/31/2016 at approximately 1:15 p.m. and discussed these results and the recommendations stated below. All questions were answered. The patient was asked to call the [facility] with any questions or issues related to the biopsy. RECOMMENDATION: 1. Surgical consultation is recommended. The patient will be contacted by one of the nurse navigators with the referral. 2. Stereotactic guided biopsy of the  additional 5 mm group of calcifications within the central, subareolar right breast, middle depth. The patient will be contacted by the breast center to schedule this biopsy. Electronically Signed   By: Pamelia Hoit M.D.   On: 12/31/2016 13:36   Result Date: 12/31/2016 CLINICAL DATA:  Patient with indeterminate right breast calcifications. EXAM: RIGHT BREAST STEREOTACTIC CORE NEEDLE BIOPSY COMPARISON:  Previous exams. FINDINGS: The patient and I discussed the procedure of stereotactic-guided biopsy including benefits and alternatives. We discussed the high likelihood of a successful procedure. We discussed the risks of the procedure including infection, bleeding, tissue injury, clip migration, and inadequate sampling. Informed written consent was given. The usual time out protocol was performed immediately prior to the procedure. Using sterile technique and 1% Lidocaine as local anesthetic, under stereotactic guidance, a 9 gauge vacuum assisted device was used to perform core needle biopsy of calcifications within the upper-outer right breast anterior depth using a cranial approach. Specimen radiograph was performed showing calcifications. Specimens with calcifications are identified for pathology. Lesion quadrant: Upper outer quadrant At the conclusion of the procedure, a ribbon shaped tissue marker clip was deployed into the biopsy cavity. Follow-up 2-view mammogram was performed and dictated separately. IMPRESSION: Stereotactic-guided biopsy of right breast calcifications. No apparent complications. Electronically Signed: By: Lovey Newcomer M.D. On: 12/30/2016 08:45    Assessment and plan- Patient is a 66 y.o. female with right breast DCIS ER + on biopsy  Discussed natural history of DCIS and difference between DCIS and invasive carcinoma. At this point she will proceed with definitive lumpectomy. I discussed the role for adjuvant radiation and will make referral to radiation Oncology 1 week post surgery. I  will see 1 week post surgery and discuss role of hormone therapy in DCIS.   There is questionable h/o personal history of ovarian cancer per patient. She does not have any records for the same and based on history this does not seem to be consistent with ovarian cancer. I have asked patient to see if she can get hold of some prior records for review. If she indeed had ovarian cancer, then along with personal h/o DCIS and family h/o breast cancer in her sister, she may qualify for genetic testing  I will see her back in 1 months time post surgery  Thank you for this kind referral and the opportunity to participate in the care of this patient   Visit Diagnosis 1. Ductal carcinoma in situ (DCIS) of right breast     Dr. Randa Evens, MD, MPH [place] at  [facility] Pager- [phone] 01/14/2017  8:29 AM

## 2017-01-15 ENCOUNTER — Encounter: Payer: Self-pay | Admitting: Family Medicine

## 2017-01-15 ENCOUNTER — Telehealth: Payer: Self-pay | Admitting: Surgery

## 2017-01-15 NOTE — Telephone Encounter (Signed)
Error

## 2017-01-15 NOTE — Progress Notes (Signed)
  Oncology Nurse Navigator Documentation  Navigator Location: [facility] (01/15/17 1700)   )Navigator Encounter Type: Telephone (01/15/17 1700) Telephone: Lahoma Crocker Call;Appt Confirmation/Clarification (01/15/17 1700)                           Interventions: Coordination of Care (01/15/17 1700)   Coordination of Care: Appts (01/15/17 1700)                  Time Spent with Patient: 15 (01/15/17 1700)   Phoned patient after initial physician consults.  Confirmed appointments.

## 2017-01-15 NOTE — Telephone Encounter (Signed)
Called patient to discuss surgery information. No answer. I have left a message on voicemail for patient to call back.   Pre op date/time and sx date. Sx: 01/30/17 with Dr Thermon Leyland breast lumpectomy with NL with possible total mastectomy.   Pre op: 01/23/17 between 9-1:00pm--phone.   Patient will need to arrive at 10:45am at [facility] the day of surgery.

## 2017-01-15 NOTE — Telephone Encounter (Signed)
Patient has returned call and all surgery information has been given. Patient understands and has been advised of arrival time the day of surgery.

## 2017-01-22 ENCOUNTER — Telehealth: Payer: Self-pay | Admitting: General Practice

## 2017-01-22 NOTE — Telephone Encounter (Signed)
Patient came by the office and dropped off FMLA paper work, patient has part of it that needs to be faxed, mailed, but the patient would also like a copy for herself, please call patient when paperwork has been filled out, payment of 25.00 has been collected as well from the patient.

## 2017-01-23 ENCOUNTER — Encounter
Admission: RE | Admit: 2017-01-23 | Discharge: 2017-01-23 | Disposition: A | Discharge status: Home / Self Care | Payer: BC Managed Care – PPO | Source: Ambulatory Visit | Attending: Surgery | Admitting: Surgery

## 2017-01-23 NOTE — Telephone Encounter (Signed)
Patient's surgery is scheduled for 01/30/17 with Dr. Tama High. FMLA and Disability paperwork is in [place] Currently and will be filled out after patient's scheduled surgery.

## 2017-01-23 NOTE — Patient Instructions (Signed)
  Your procedure is scheduled on: 01-30-17 [place] @ 7:40 AM  Remember: Instructions that are not followed completely may result in serious medical risk, up to and including death, or upon the discretion of your surgeon and anesthesiologist your surgery may need to be rescheduled.    _x___ 1. Do not eat food after midnight the night before your procedure. NO GUM CHEWING OR HARD CANDIES. You may drink clear liquids up to 2 hours before you are scheduled to arrive at the hospital for your procedure.  Do not drink clear liquids within 2 hours of your scheduled arrival to the hospital.  Clear liquids include  --Water or Apple juice without pulp  --Clear carbohydrate beverage such as ClearFast or Gatorade  --Black Coffee or Clear Tea (No milk, no creamers, do not add anything to the coffee or Tea)  Type 1 and type 2 diabetics should only drink water. .     __x__ 2. No Alcohol for 24 hours before or after surgery.   __x__3. No Smoking for 24 prior to surgery.   ____  4. Bring all medications with you on the day of surgery if instructed.    __x__ 5. Notify your doctor if there is any change in your medical condition     (cold, fever, infections).     Do not wear jewelry, make-up, hairpins, clips or nail polish.  Do not wear lotions, powders, or perfumes. You may wear deodorant.  Do not shave 48 hours prior to surgery. Men may shave face and neck.  Do not bring valuables to the hospital.    [facility] is not responsible for any belongings or valuables.               Contacts, dentures or bridgework may not be worn into surgery.  Leave your suitcase in the car. After surgery it may be brought to your room.  For patients admitted to the hospital, discharge time is determined by your treatment team.   Patients discharged the day of surgery will not be allowed to drive home.  You will need someone to drive you home and stay with you the night of your procedure.     Please read over the following fact sheets that you were given:     ____ Take anti-hypertensive listed below, cardiac, seizure, asthma, anti-reflux and psychiatric medicines. These include:  1. NONE  2.  3.  4.  5.  6.  ____Fleets enema or Magnesium Citrate as directed.   _x___ Use CHG Soap or sage wipes as directed on instruction sheet   ____ Use inhalers on the day of surgery and bring to hospital day of surgery  ____ Stop Metformin and Janumet 2 days prior to surgery.    ____ Take 1/2 of usual insulin dose the night before surgery and none on the morning surgery.   _x___ Follow recommendations from Cardiologist, Pulmonologist or PCP regarding stopping Aspirin, Coumadin, Plavix ,Eliquis, Effient, or Pradaxa, and Pletal-STOP ASPIRIN NOW  X____Stop Anti-inflammatories such as Advil, Aleve, Ibuprofen, Motrin, Naproxen, Naprosyn, Goodies powders or aspirin products NOW-OK to take Tylenol   ____ Stop supplements until after surgery.     ____ Bring C-Pap to the hospital.

## 2017-01-27 ENCOUNTER — Encounter
Admission: RE | Admit: 2017-01-27 | Discharge: 2017-01-27 | Disposition: A | Discharge status: Home / Self Care | Payer: BC Managed Care – PPO | Source: Ambulatory Visit | Attending: Surgery | Admitting: Surgery

## 2017-01-27 DIAGNOSIS — Z01812 Encounter for preprocedural laboratory examination: Secondary | ICD-10-CM

## 2017-01-27 DIAGNOSIS — Z79899 Other long term (current) drug therapy: Secondary | ICD-10-CM | POA: Diagnosis not present

## 2017-01-27 DIAGNOSIS — I10 Essential (primary) hypertension: Secondary | ICD-10-CM

## 2017-01-27 DIAGNOSIS — M19041 Primary osteoarthritis, right hand: Secondary | ICD-10-CM | POA: Diagnosis not present

## 2017-01-27 DIAGNOSIS — I129 Hypertensive chronic kidney disease with stage 1 through stage 4 chronic kidney disease, or unspecified chronic kidney disease: Secondary | ICD-10-CM | POA: Diagnosis not present

## 2017-01-27 DIAGNOSIS — Z9889 Other specified postprocedural states: Secondary | ICD-10-CM | POA: Diagnosis not present

## 2017-01-27 DIAGNOSIS — D0511 Intraductal carcinoma in situ of right breast: Secondary | ICD-10-CM | POA: Diagnosis not present

## 2017-01-27 DIAGNOSIS — Z01810 Encounter for preprocedural cardiovascular examination: Secondary | ICD-10-CM | POA: Insufficient documentation

## 2017-01-27 DIAGNOSIS — E559 Vitamin D deficiency, unspecified: Secondary | ICD-10-CM | POA: Diagnosis not present

## 2017-01-27 DIAGNOSIS — E785 Hyperlipidemia, unspecified: Secondary | ICD-10-CM | POA: Diagnosis not present

## 2017-01-27 DIAGNOSIS — G8929 Other chronic pain: Secondary | ICD-10-CM | POA: Diagnosis not present

## 2017-01-27 DIAGNOSIS — Z6834 Body mass index (BMI) 34.0-34.9, adult: Secondary | ICD-10-CM | POA: Diagnosis not present

## 2017-01-27 DIAGNOSIS — E669 Obesity, unspecified: Secondary | ICD-10-CM | POA: Diagnosis not present

## 2017-01-27 DIAGNOSIS — R7303 Prediabetes: Secondary | ICD-10-CM | POA: Diagnosis not present

## 2017-01-27 DIAGNOSIS — Z833 Family history of diabetes mellitus: Secondary | ICD-10-CM | POA: Diagnosis not present

## 2017-01-27 DIAGNOSIS — Z800 Family history of malignant neoplasm of digestive organs: Secondary | ICD-10-CM | POA: Diagnosis not present

## 2017-01-27 DIAGNOSIS — M19042 Primary osteoarthritis, left hand: Secondary | ICD-10-CM | POA: Diagnosis not present

## 2017-01-27 DIAGNOSIS — Z880 Allergy status to penicillin: Secondary | ICD-10-CM | POA: Diagnosis not present

## 2017-01-27 DIAGNOSIS — Z90710 Acquired absence of both cervix and uterus: Secondary | ICD-10-CM | POA: Diagnosis not present

## 2017-01-27 DIAGNOSIS — Z7982 Long term (current) use of aspirin: Secondary | ICD-10-CM | POA: Diagnosis not present

## 2017-01-27 DIAGNOSIS — N182 Chronic kidney disease, stage 2 (mild): Secondary | ICD-10-CM | POA: Diagnosis not present

## 2017-01-27 DIAGNOSIS — Z170 Estrogen receptor positive status [ER+]: Secondary | ICD-10-CM | POA: Diagnosis not present

## 2017-01-27 DIAGNOSIS — R760 Raised antibody titer: Secondary | ICD-10-CM | POA: Diagnosis not present

## 2017-01-27 DIAGNOSIS — G4733 Obstructive sleep apnea (adult) (pediatric): Secondary | ICD-10-CM | POA: Diagnosis not present

## 2017-01-27 DIAGNOSIS — M25512 Pain in left shoulder: Secondary | ICD-10-CM | POA: Diagnosis not present

## 2017-01-27 DIAGNOSIS — Z841 Family history of disorders of kidney and ureter: Secondary | ICD-10-CM | POA: Diagnosis not present

## 2017-01-27 DIAGNOSIS — Z8249 Family history of ischemic heart disease and other diseases of the circulatory system: Secondary | ICD-10-CM | POA: Diagnosis not present

## 2017-01-27 LAB — BASIC METABOLIC PANEL
Anion gap: 9 (ref 5–15)
BUN: 18 mg/dL (ref 6–20)
CALCIUM: 9.2 mg/dL (ref 8.9–10.3)
CO2: 26 mmol/L (ref 22–32)
CREATININE: 1.01 mg/dL — AB (ref 0.44–1.00)
Chloride: 103 mmol/L (ref 101–111)
GFR calc Af Amer: >60 mL/min (ref >60)
GFR calc non Af Amer: 56 mL/min — ABNORMAL LOW (ref >60)
GLUCOSE: 79 mg/dL (ref 65–99)
Potassium: 3.8 mmol/L (ref 3.5–5.1)
Sodium: 138 mmol/L (ref 135–145)

## 2017-01-27 LAB — HEMOGLOBIN: Hemoglobin: 12.2 g/dL (ref 12.0–16.0)

## 2017-01-29 ENCOUNTER — Telehealth: Payer: Self-pay

## 2017-01-29 MED ORDER — VANCOMYCIN HCL IN DEXTROSE 1-5 GM/200ML-% IV SOLN
1000.0000 mg | INTRAVENOUS | Status: AC
  Administered 2017-01-30: 1000 mg via INTRAVENOUS

## 2017-01-29 NOTE — Telephone Encounter (Signed)
Patient's FMLA forms were faxed to [phone] Attention: Delma Freeze, HR and Disability Forms to [phone] to [place].

## 2017-01-30 ENCOUNTER — Encounter: Payer: Self-pay | Admitting: Anesthesiology

## 2017-01-30 ENCOUNTER — Ambulatory Visit
Admission: RE | Admit: 2017-01-30 | Discharge: 2017-01-30 | Disposition: A | Discharge status: Home / Self Care | Payer: BC Managed Care – PPO | Source: Ambulatory Visit | Attending: Surgery | Admitting: Surgery

## 2017-01-30 ENCOUNTER — Ambulatory Visit: Payer: BC Managed Care – PPO | Admitting: Anesthesiology

## 2017-01-30 ENCOUNTER — Encounter
Admission: RE | Disposition: A | Discharge status: Home / Self Care | Payer: Self-pay | Source: Ambulatory Visit | Attending: Surgery

## 2017-01-30 DIAGNOSIS — N182 Chronic kidney disease, stage 2 (mild): Secondary | ICD-10-CM | POA: Diagnosis not present

## 2017-01-30 DIAGNOSIS — Z79899 Other long term (current) drug therapy: Secondary | ICD-10-CM | POA: Insufficient documentation

## 2017-01-30 DIAGNOSIS — R7303 Prediabetes: Secondary | ICD-10-CM | POA: Insufficient documentation

## 2017-01-30 DIAGNOSIS — Z9889 Other specified postprocedural states: Secondary | ICD-10-CM | POA: Insufficient documentation

## 2017-01-30 DIAGNOSIS — M25512 Pain in left shoulder: Secondary | ICD-10-CM | POA: Insufficient documentation

## 2017-01-30 DIAGNOSIS — M19041 Primary osteoarthritis, right hand: Secondary | ICD-10-CM | POA: Diagnosis not present

## 2017-01-30 DIAGNOSIS — Z880 Allergy status to penicillin: Secondary | ICD-10-CM | POA: Insufficient documentation

## 2017-01-30 DIAGNOSIS — Z841 Family history of disorders of kidney and ureter: Secondary | ICD-10-CM | POA: Insufficient documentation

## 2017-01-30 DIAGNOSIS — G8929 Other chronic pain: Secondary | ICD-10-CM | POA: Insufficient documentation

## 2017-01-30 DIAGNOSIS — Z803 Family history of malignant neoplasm of breast: Secondary | ICD-10-CM | POA: Insufficient documentation

## 2017-01-30 DIAGNOSIS — E785 Hyperlipidemia, unspecified: Secondary | ICD-10-CM | POA: Insufficient documentation

## 2017-01-30 DIAGNOSIS — Z800 Family history of malignant neoplasm of digestive organs: Secondary | ICD-10-CM | POA: Insufficient documentation

## 2017-01-30 DIAGNOSIS — G4733 Obstructive sleep apnea (adult) (pediatric): Secondary | ICD-10-CM | POA: Diagnosis not present

## 2017-01-30 DIAGNOSIS — D0511 Intraductal carcinoma in situ of right breast: Secondary | ICD-10-CM

## 2017-01-30 DIAGNOSIS — E669 Obesity, unspecified: Secondary | ICD-10-CM | POA: Insufficient documentation

## 2017-01-30 DIAGNOSIS — Z833 Family history of diabetes mellitus: Secondary | ICD-10-CM | POA: Insufficient documentation

## 2017-01-30 DIAGNOSIS — I129 Hypertensive chronic kidney disease with stage 1 through stage 4 chronic kidney disease, or unspecified chronic kidney disease: Secondary | ICD-10-CM | POA: Insufficient documentation

## 2017-01-30 DIAGNOSIS — Z8249 Family history of ischemic heart disease and other diseases of the circulatory system: Secondary | ICD-10-CM | POA: Insufficient documentation

## 2017-01-30 DIAGNOSIS — Z170 Estrogen receptor positive status [ER+]: Secondary | ICD-10-CM | POA: Insufficient documentation

## 2017-01-30 DIAGNOSIS — E559 Vitamin D deficiency, unspecified: Secondary | ICD-10-CM | POA: Insufficient documentation

## 2017-01-30 DIAGNOSIS — M19042 Primary osteoarthritis, left hand: Secondary | ICD-10-CM | POA: Insufficient documentation

## 2017-01-30 DIAGNOSIS — Z90710 Acquired absence of both cervix and uterus: Secondary | ICD-10-CM | POA: Insufficient documentation

## 2017-01-30 DIAGNOSIS — Z6834 Body mass index (BMI) 34.0-34.9, adult: Secondary | ICD-10-CM | POA: Insufficient documentation

## 2017-01-30 DIAGNOSIS — Z7982 Long term (current) use of aspirin: Secondary | ICD-10-CM | POA: Insufficient documentation

## 2017-01-30 DIAGNOSIS — R760 Raised antibody titer: Secondary | ICD-10-CM | POA: Insufficient documentation

## 2017-01-30 HISTORY — PX: TOTAL MASTECTOMY: SHX6129

## 2017-01-30 HISTORY — PX: BREAST LUMPECTOMY WITH NEEDLE LOCALIZATION: SHX5759

## 2017-01-30 SURGERY — BREAST LUMPECTOMY WITH NEEDLE LOCALIZATION
Anesthesia: General | Laterality: Right | Wound class: Clean

## 2017-01-30 MED ORDER — ONDANSETRON HCL 4 MG/2ML IJ SOLN
4.0000 mg | Freq: Once | INTRAMUSCULAR | Status: AC | PRN
  Administered 2017-01-30: 4 mg via INTRAVENOUS

## 2017-01-30 MED ORDER — OXYCODONE HCL 5 MG/5ML PO SOLN: 5.0000 mg | Freq: Once | ORAL | Status: DC | PRN

## 2017-01-30 MED ORDER — FAMOTIDINE 20 MG PO TABS
ORAL_TABLET | ORAL | Status: AC
  Administered 2017-01-30: 20 mg via ORAL
  Filled 2017-01-30: qty 1

## 2017-01-30 MED ORDER — FENTANYL CITRATE (PF) 100 MCG/2ML IJ SOLN
INTRAMUSCULAR | Status: DC | PRN
  Administered 2017-01-30 (×2): 50 ug via INTRAVENOUS

## 2017-01-30 MED ORDER — BUPIVACAINE HCL (PF) 0.5 % IJ SOLN
INTRAMUSCULAR | Status: AC
  Filled 2017-01-30: qty 30

## 2017-01-30 MED ORDER — FAMOTIDINE 20 MG PO TABS
20.0000 mg | ORAL_TABLET | Freq: Once | ORAL | Status: AC
  Administered 2017-01-30: 20 mg via ORAL

## 2017-01-30 MED ORDER — BUPIVACAINE HCL (PF) 0.5 % IJ SOLN
INTRAMUSCULAR | Status: DC | PRN
  Administered 2017-01-30: 10 mL

## 2017-01-30 MED ORDER — GLYCOPYRROLATE 0.2 MG/ML IJ SOLN
INTRAMUSCULAR | Status: DC | PRN
  Administered 2017-01-30: 0.2 mg via INTRAVENOUS

## 2017-01-30 MED ORDER — FENTANYL CITRATE (PF) 100 MCG/2ML IJ SOLN
INTRAMUSCULAR | Status: AC
  Administered 2017-01-30: 50 ug via INTRAVENOUS
  Filled 2017-01-30: qty 2

## 2017-01-30 MED ORDER — PROPOFOL 500 MG/50ML IV EMUL
INTRAVENOUS | Status: AC
  Filled 2017-01-30: qty 50

## 2017-01-30 MED ORDER — CHLORHEXIDINE GLUCONATE CLOTH 2 % EX PADS: 6.0000 | MEDICATED_PAD | Freq: Once | CUTANEOUS | Status: DC

## 2017-01-30 MED ORDER — LIDOCAINE 2% (20 MG/ML) 5 ML SYRINGE
INTRAMUSCULAR | Status: DC | PRN
  Administered 2017-01-30: 100 mg via INTRAVENOUS

## 2017-01-30 MED ORDER — OXYCODONE HCL 5 MG PO TABS: 5.0000 mg | ORAL_TABLET | Freq: Once | ORAL | Status: DC | PRN

## 2017-01-30 MED ORDER — DEXAMETHASONE SODIUM PHOSPHATE 10 MG/ML IJ SOLN
INTRAMUSCULAR | Status: DC | PRN
  Administered 2017-01-30: 10 mg via INTRAVENOUS

## 2017-01-30 MED ORDER — PROPOFOL 10 MG/ML IV BOLUS
INTRAVENOUS | Status: DC | PRN
  Administered 2017-01-30: 150 mg via INTRAVENOUS

## 2017-01-30 MED ORDER — GLYCOPYRROLATE 0.2 MG/ML IJ SOLN
INTRAMUSCULAR | Status: AC
  Filled 2017-01-30: qty 1

## 2017-01-30 MED ORDER — ONDANSETRON HCL 4 MG/2ML IJ SOLN
INTRAMUSCULAR | Status: AC
  Filled 2017-01-30: qty 2

## 2017-01-30 MED ORDER — MIDAZOLAM HCL 2 MG/2ML IJ SOLN
INTRAMUSCULAR | Status: AC
  Filled 2017-01-30: qty 2

## 2017-01-30 MED ORDER — METOCLOPRAMIDE HCL 5 MG/ML IJ SOLN
INTRAMUSCULAR | Status: AC
  Administered 2017-01-30: 10 mg via INTRAVENOUS
  Filled 2017-01-30: qty 2

## 2017-01-30 MED ORDER — FENTANYL CITRATE (PF) 100 MCG/2ML IJ SOLN
INTRAMUSCULAR | Status: AC
  Filled 2017-01-30: qty 2

## 2017-01-30 MED ORDER — METOCLOPRAMIDE HCL 5 MG/ML IJ SOLN
10.0000 mg | Freq: Once | INTRAMUSCULAR | Status: AC
  Administered 2017-01-30: 10 mg via INTRAVENOUS

## 2017-01-30 MED ORDER — BUPIVACAINE-EPINEPHRINE (PF) 0.25% -1:200000 IJ SOLN
INTRAMUSCULAR | Status: AC
  Filled 2017-01-30: qty 30

## 2017-01-30 MED ORDER — ONDANSETRON HCL 4 MG/2ML IJ SOLN
INTRAMUSCULAR | Status: AC
  Administered 2017-01-30: 4 mg via INTRAVENOUS
  Filled 2017-01-30: qty 2

## 2017-01-30 MED ORDER — FENTANYL CITRATE (PF) 100 MCG/2ML IJ SOLN
25.0000 ug | INTRAMUSCULAR | Status: DC | PRN
  Administered 2017-01-30 (×2): 50 ug via INTRAVENOUS

## 2017-01-30 MED ORDER — HYDRALAZINE HCL 20 MG/ML IJ SOLN
INTRAMUSCULAR | Status: DC | PRN
  Administered 2017-01-30: 10 mg via INTRAVENOUS

## 2017-01-30 MED ORDER — OXYCODONE-ACETAMINOPHEN 5-325 MG PO TABS: 1.0000 | ORAL_TABLET | ORAL | 0 refills | Status: DC | PRN

## 2017-01-30 MED ORDER — LACTATED RINGERS IV SOLN
INTRAVENOUS | Status: DC
  Administered 2017-01-30 (×3): via INTRAVENOUS

## 2017-01-30 MED ORDER — MIDAZOLAM HCL 5 MG/5ML IJ SOLN
INTRAMUSCULAR | Status: DC | PRN
  Administered 2017-01-30 (×2): 1 mg via INTRAVENOUS

## 2017-01-30 MED ORDER — VANCOMYCIN HCL IN DEXTROSE 1-5 GM/200ML-% IV SOLN
INTRAVENOUS | Status: AC
  Filled 2017-01-30: qty 200

## 2017-01-30 MED ORDER — PHENYLEPHRINE HCL 10 MG/ML IJ SOLN
INTRAMUSCULAR | Status: DC | PRN
  Administered 2017-01-30: 100 ug via INTRAVENOUS
  Administered 2017-01-30 (×2): 50 ug via INTRAVENOUS

## 2017-01-30 MED ORDER — ONDANSETRON HCL 4 MG/2ML IJ SOLN
INTRAMUSCULAR | Status: DC | PRN
  Administered 2017-01-30: 4 mg via INTRAVENOUS

## 2017-01-30 MED ORDER — DEXAMETHASONE SODIUM PHOSPHATE 10 MG/ML IJ SOLN
INTRAMUSCULAR | Status: AC
  Filled 2017-01-30: qty 1

## 2017-01-30 SURGICAL SUPPLY — 50 items
BENZOIN TINCTURE PRP APPL 2/3 (GAUZE/BANDAGES/DRESSINGS) ×3 IMPLANT
BLADE SURG 15 STRL LF DISP TIS (BLADE) ×1 IMPLANT
BLADE SURG 15 STRL SS (BLADE) ×2
BNDG COHESIVE 4X5 TAN STRL (GAUZE/BANDAGES/DRESSINGS) ×3 IMPLANT
BRA SURGICAL LRG (MISCELLANEOUS) ×3 IMPLANT
BULB RESERV EVAC DRAIN JP 100C (MISCELLANEOUS) ×6 IMPLANT
CANISTER SUCT 1200ML W/VALVE (MISCELLANEOUS) ×3 IMPLANT
CHLORAPREP W/TINT 26ML (MISCELLANEOUS) ×3 IMPLANT
CLOSURE WOUND 1/2 X4 (GAUZE/BANDAGES/DRESSINGS) ×4
COVER PROBE FLX POLY STRL (MISCELLANEOUS) ×3 IMPLANT
DERMABOND ADVANCED (GAUZE/BANDAGES/DRESSINGS) ×2
DERMABOND ADVANCED .7 DNX12 (GAUZE/BANDAGES/DRESSINGS) ×1 IMPLANT
DEVICE DUBIN SPECIMEN MAMMOGRA (MISCELLANEOUS) ×3 IMPLANT
DRAIN CHANNEL JP 19F (MISCELLANEOUS) ×6 IMPLANT
DRAPE LAPAROTOMY TRNSV 106X77 (MISCELLANEOUS) ×3 IMPLANT
DRAPE SHEET LG 3/4 BI-LAMINATE (DRAPES) ×3 IMPLANT
DRSG TELFA 3X8 NADH (GAUZE/BANDAGES/DRESSINGS) ×3 IMPLANT
ELECT CAUTERY BLADE 6.4 (BLADE) ×3 IMPLANT
ELECT REM PT RETURN 9FT ADLT (ELECTROSURGICAL) ×3
ELECTRODE REM PT RTRN 9FT ADLT (ELECTROSURGICAL) ×1 IMPLANT
GAUZE FLUFF 18X24 1PLY STRL (GAUZE/BANDAGES/DRESSINGS) ×3 IMPLANT
GLOVE BIO SURGEON STRL SZ7.5 (GLOVE) ×3 IMPLANT
GLOVE BIOGEL PI IND STRL 8 (GLOVE) ×1 IMPLANT
GLOVE BIOGEL PI INDICATOR 8 (GLOVE) ×2
GLOVE INDICATOR 8.0 STRL GRN (GLOVE) ×3 IMPLANT
GOWN STRL REUS W/ TWL LRG LVL3 (GOWN DISPOSABLE) ×2 IMPLANT
GOWN STRL REUS W/ TWL XL LVL3 (GOWN DISPOSABLE) ×1 IMPLANT
GOWN STRL REUS W/TWL LRG LVL3 (GOWN DISPOSABLE) ×4
GOWN STRL REUS W/TWL XL LVL3 (GOWN DISPOSABLE) ×2
LABEL OR SOLS (LABEL) ×3 IMPLANT
MARGIN MAP 10MM (MISCELLANEOUS) ×3 IMPLANT
NEEDLE HYPO 22GX1.5 SAFETY (NEEDLE) ×6 IMPLANT
NEEDLE HYPO 25X1 1.5 SAFETY (NEEDLE) ×3 IMPLANT
PACK BASIN MAJOR ARMC (MISCELLANEOUS) ×3 IMPLANT
PACK BASIN MINOR ARMC (MISCELLANEOUS) ×3 IMPLANT
STOCKINETTE IMPERVIOUS 9X36 MD (GAUZE/BANDAGES/DRESSINGS) ×3 IMPLANT
STRIP CLOSURE SKIN 1/2X4 (GAUZE/BANDAGES/DRESSINGS) ×8 IMPLANT
SUT ETHILON 3-0 FS-10 30 BLK (SUTURE) ×3
SUT MNCRL 4-0 (SUTURE) ×4
SUT MNCRL 4-0 27XMFL (SUTURE) ×2
SUT SILK 2 0 SH (SUTURE) ×3 IMPLANT
SUT SILK 3-0 (SUTURE) ×6 IMPLANT
SUT VIC AB 2-0 CT1 27 (SUTURE) ×6
SUT VIC AB 2-0 CT1 TAPERPNT 27 (SUTURE) ×3 IMPLANT
SUT VIC AB 3-0 SH 27 (SUTURE) ×6
SUT VIC AB 3-0 SH 27X BRD (SUTURE) ×3 IMPLANT
SUTURE EHLN 3-0 FS-10 30 BLK (SUTURE) ×1 IMPLANT
SUTURE MNCRL 4-0 27XMF (SUTURE) ×2 IMPLANT
SYRINGE 10CC LL (SYRINGE) ×6 IMPLANT
WATER STERILE IRR 1000ML POUR (IV SOLUTION) ×3 IMPLANT

## 2017-01-30 NOTE — Op Note (Signed)
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)  PROCEDURE(S):  1.) Right partial mastectomy/lumpectomy with image-guided wire localization (cpt: 19301)  INTRAOPERATIVE FINDINGS: Wire-localized mammary tissue removed in its entirety (no palpable mass appreciated) including biopsy-associated winged clip, confirmed intra-operatively by radiology  INTRAVENOUS FLUIDS: 500 mL crystalloid   ESTIMATED BLOOD LOSS: Minimal (<20 mL)  URINE OUTPUT: No Foley   SPECIMENS: Right breast lumpectomy/partial mastectomy  IMPLANTS: None  DRAINS: none  COMPLICATIONS: None apparent  CONDITION AT END OF PROCEDURE: Hemodynamically stable and extubated  DISPOSITION OF PATIENT: PACU  INDICATIONS FOR PROCEDURE:  66 y.o. femalepresented for evaluation of abnormal Right breast diagnostic mammogram with abnormal core needle biopsy findings, for which excisional biopsy was advised. All risks, benefits, and alternatives to lumpectomy vs mastectomy were discussed with the patient, all of patient's questions were answered to her expressed satisfaction, patient elected to proceed with lumpectomy/partial mastectomy and informed consent was obtained and documented.  DETAILS OF PROCEDURE: Pre-operative needle-/wire-localization was performed by radiology, and localization studies were reviewed. Patient was brought to the operating suite and appropriately identified. General anesthesia was administered along with appropriate pre-operative antibiotics, and endotracheal intubation was performed by anesthetist. In supine position, operative site was prepped and draped in the usual sterile fashion, and following a brief time out, an upper breast incision  parallel to circumareolar line was made using a #15 blade scalpel starting with incorporation of the wire entry site and extending 3 cm laterally, around which appropriate thickness skin flaps were created and excision was continued deep to chest wall to include the localization wire/needle in its entirety with appropriately wide margins. Long lateral suture and short superior suture were used to orient the specimen, which was then handed off the field for radiographic assessment and pathology processing. Hemostasis was confirmed, and wound was then re-approximated in layers using buried interrupted 3-0 Vicryl for dermis. Radiographic confirmation that the entire lesion was excised -- including the localizing wire and clip -- was received prior to completion of closure. Skin was then cleaned and dried, and sterile skin glue was applied and allowed to dry.   Patient was then safely able to be extubated, awakened, and transferred to PACU for post-operative monitoring and care.  I was present for all aspects of the above procedure, and there were no complications apparent.

## 2017-01-30 NOTE — Transfer of Care (Signed)
Immediate Anesthesia Transfer of Care Note  Patient: YAIZA PALAZZOLA  Procedure(s) Performed: Procedure(s): BREAST LUMPECTOMY WITH NEEDLE LOCALIZATION (Right) TOTAL MASTECTOMY (Right)  Patient Location: PACU  Anesthesia Type:General  Level of Consciousness: awake, alert  and oriented  Airway & Oxygen Therapy: Patient Spontanous Breathing and Patient connected to face mask oxygen  Post-op Assessment: Report given to RN and Post -op Vital signs reviewed and stable  Post vital signs: Reviewed and stable  Last Vitals:  Vitals:   01/30/17 1149 01/30/17 1436  BP: (!) 194/98 (!) (P) 171/86  Pulse: 70 (P) 81  Resp: 16 (P) 16  Temp: (!) 36 C (!) (P) 36.2 C  SpO2:  (P) 100%    Last Pain:  Vitals:   01/30/17 1149  TempSrc: Tympanic  PainSc: 0-No pain         Complications: No apparent anesthesia complications

## 2017-01-30 NOTE — Anesthesia Preprocedure Evaluation (Signed)
Anesthesia Evaluation  Patient identified by MRN, date of birth, ID band Patient awake    Reviewed: Allergy & Precautions, H&P , NPO status , Patient's Chart, lab work & pertinent test results  History of Anesthesia Complications (+) PONVNegative for: history of anesthetic complications  Airway Mallampati: III  TM Distance: <3 FB Neck ROM: limited    Dental  (+) Poor Dentition, Chipped, Missing   Pulmonary neg shortness of breath, sleep apnea ,           Cardiovascular Exercise Tolerance: Good hypertension, (-) angina(-) Past MI and (-) DOE      Neuro/Psych negative neurological ROS  negative psych ROS   GI/Hepatic negative GI ROS, Neg liver ROS,   Endo/Other  negative endocrine ROS  Renal/GU Renal disease     Musculoskeletal  (+) Arthritis ,   Abdominal   Peds  Hematology negative hematology ROS (+)   Anesthesia Other Findings Past Medical History: 01/17/2016: Abnormal mammogram of right breast     Comment:  Recommendation: Six month follow-up diagnostic mammogram              of the right breast for an additional group of probably               benign Calcifications. Sept 2017 --> due March 2018 10/04/2016: Arthralgia No date: Arthritis     Comment:  hands 12/07/2006: Benign essential HTN 11/15/2015: Breast cancer screening 11/15/2015: Chronic kidney disease, stage II (mild) 11/08/2014: Chronic left shoulder pain 12/31/2016: DCIS (ductal carcinoma in situ) of breast     Comment:  RIGHT, August 2018 06/12/2016: Family history of diabetes mellitus     Comment:  Mother and several other relatives 06/15/2015: HLD (hyperlipidemia) 01/17/2016: Hx of right breast biopsy No date: Hyperlipidemia No date: Hypertension 11/15/2015: Medication monitoring encounter No date: Motion sickness     Comment:  ocean ships 11/07/2014: Numbness and tingling in left hand 10/25/2015: Obesity 10/25/2015: Obesity (BMI 30.0-34.9) 10/04/2016:  Positive ANA (antinuclear antibody) No date: Prediabetes 11/15/2015: Preventative health care 10/29/2015: Vitamin D deficiency  Past Surgical History: No date: ABDOMINAL HYSTERECTOMY 01/15/2016: BREAST BIOPSY; Right     Comment:  neg- core 12/30/2016: BREAST BIOPSY; Right     Comment:  stereo bx for calcifications: DCIS 01/08/2017: BREAST BIOPSY; Right     Comment:  path pending 11/19/2016: COLONOSCOPY WITH PROPOFOL; N/A     Comment:  Procedure: COLONOSCOPY WITH PROPOFOL;  Surgeon: Jonathon Bellows, MD;  Location: [facility];  Service:               Endoscopy;  Laterality: N/A; 11/19/2016: ESOPHAGOGASTRODUODENOSCOPY (EGD) WITH PROPOFOL; N/A     Comment:  Procedure: ESOPHAGOGASTRODUODENOSCOPY (EGD) WITH               PROPOFOL;  Surgeon: Jonathon Bellows, MD;  Location: [facility];  Service: Endoscopy;  Laterality: N/A; 12/12/2016: GIVENS CAPSULE STUDY; N/A     Comment:  Procedure: GIVENS CAPSULE STUDY;  Surgeon: Jonathon Bellows,               MD;  Location: [facility];  Service:               Gastroenterology;  Laterality: N/A;  BMI    Body Mass Index:  34.75 kg/m      Reproductive/Obstetrics negative OB ROS  Anesthesia Physical Anesthesia Plan  ASA: III  Anesthesia Plan: General LMA   Post-op Pain Management:    Induction: Intravenous  PONV Risk Score and Plan: 4 or greater and Ondansetron, Dexamethasone and Treatment may vary due to age or medical condition  Airway Management Planned: LMA  Additional Equipment:   Intra-op Plan:   Post-operative Plan: Extubation in OR  Informed Consent: I have reviewed the patients History and Physical, chart, labs and discussed the procedure including the risks, benefits and alternatives for the proposed anesthesia with the patient or authorized representative who has indicated his/her understanding and acceptance.   Dental Advisory  Given  Plan Discussed with: Anesthesiologist, CRNA and Surgeon  Anesthesia Plan Comments: (Patient consented for risks of anesthesia including but not limited to:  - adverse reactions to medications - damage to teeth, lips or other oral mucosa - sore throat or hoarseness - Damage to heart, brain, lungs or loss of life  Patient voiced understanding.)        Anesthesia Quick Evaluation

## 2017-01-30 NOTE — Anesthesia Postprocedure Evaluation (Signed)
Anesthesia Post Note  Patient: Marissa Nelson  Procedure(s) Performed: Procedure(s) (LRB): BREAST LUMPECTOMY WITH NEEDLE LOCALIZATION (Right) TOTAL MASTECTOMY (Right)  Patient location during evaluation: PACU Anesthesia Type: General Level of consciousness: awake and alert and oriented Pain management: pain level controlled Vital Signs Assessment: post-procedure vital signs reviewed and stable Respiratory status: spontaneous breathing Cardiovascular status: blood pressure returned to baseline Anesthetic complications: no     Last Vitals:  Vitals:   01/30/17 1506 01/30/17 1521  BP: (!) 185/104 (!) 150/71  Pulse: 88 86  Resp: 11 11  Temp:    SpO2: 100% 100%    Last Pain:  Vitals:   01/30/17 1521  TempSrc:   PainSc: 10-Worst pain ever                 Clydell Alberts

## 2017-01-30 NOTE — OR Nursing (Signed)
Pt reports the nausea is subsiding after peppermint aroma therapy and IVF bolus of 500 ml LR.  Pt want to stay until 1730 then try to start getting dressed.  Durene Cal. Given report

## 2017-01-30 NOTE — H&P (View-Only) (Signed)
Surgical Clinic History and Physical  Referring provider:  Arnetha Courser, MD [address] [place], [place] [address]  HISTORY OF PRESENT ILLNESS (HPI):  66 y.o. female presents for evaluation of Right breast upper outer quadrant DCIS identified as calcifications on recent annual screening mammogram (8/14) with subsequent core needle biopsy (8/28). Patient reports she performs daily self-exams and has been unable to palpate any mass(es). She previously underwent Right breast core needle biopsy (01/15/2016) with no abnormal findings and otherwise denies any breast pain (except post-biopsy), nipple discharge, weight loss, fever/chills, N/V, CP, or SOB. She first experienced menstruation at 66 years old, has been pregnant twice with delivery of 2 boys, did not breast feed either child, and underwent hysterectomy with B/L salpingo-oophorectomy in 1988 for contraception.  Of note, additional calcifications were noticed upon review of her mammogram, and she is scheduled for core needle biopsy of these additional subareolar calcifications tomorrow, though patient questions why she needs an additional biopsy.  PAST MEDICAL HISTORY (PMH):  Past Medical History:  Diagnosis Date  . Abnormal mammogram of right breast 01/17/2016   Recommendation: Six month follow-up diagnostic mammogram of the right breast for an additional group of probably benign Calcifications. Sept 2017 --> due March 2018  . Arthralgia 10/04/2016  . Arthritis    hands  . Benign essential HTN 12/07/2006  . Breast cancer screening 11/15/2015  . Chronic kidney disease, stage II (mild) 11/15/2015  . Chronic left shoulder pain 11/08/2014  . 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 Family history of diabetes mellitus 06/12/2016   Mother and several other relatives  . HLD (hyperlipidemia) 06/15/2015  . Hx of right breast biopsy 01/17/2016  . Hyperlipidemia   . Hypertension   . Medication monitoring  encounter 11/15/2015  . Motion sickness    ocean ships  . Numbness and tingling in left hand 11/07/2014  . Obesity 10/25/2015  . Obesity (BMI 30.0-34.9) 10/25/2015  . Obstructive apnea 06/15/2015  . Positive ANA (antinuclear antibody) 10/04/2016  . Prediabetes   . Preventative health care 11/15/2015  . Vitamin D deficiency 10/29/2015     PAST SURGICAL HISTORY [facility]):  Past Surgical History:  Procedure Laterality Date  . ABDOMINAL HYSTERECTOMY    . BREAST BIOPSY Right 01/15/2016   neg- core  . BREAST BIOPSY Right 12/30/2016   stereo bx for calcifications: DCIS  . COLONOSCOPY WITH PROPOFOL N/A 11/19/2016   Procedure: COLONOSCOPY WITH PROPOFOL;  Surgeon: Jonathon Bellows, MD;  Location: [place];  Service: Endoscopy;  Laterality: N/A;  . ESOPHAGOGASTRODUODENOSCOPY (EGD) WITH PROPOFOL N/A 11/19/2016   Procedure: ESOPHAGOGASTRODUODENOSCOPY (EGD) WITH PROPOFOL;  Surgeon: Jonathon Bellows, MD;  Location: [facility];  Service: Endoscopy;  Laterality: N/A;  . GIVENS CAPSULE STUDY N/A 12/12/2016   Procedure: GIVENS CAPSULE STUDY;  Surgeon: Jonathon Bellows, MD;  Location: [facility] ENDOSCOPY;  Service: Gastroenterology;  Laterality: N/A;     MEDICATIONS:  Prior to Admission medications   Medication Sig Start Date End Date Taking? Authorizing Provider  aspirin EC 81 MG tablet Take 1 tablet (81 mg total) by mouth daily. 11/15/15  Yes Lada, Satira Anis, MD  losartan-hydrochlorothiazide (HYZAAR) 100-25 MG tablet Take 1 tablet by mouth daily. 10/25/15  Yes Plonk, Gwyndolyn Saxon, MD  Vitamin D, Ergocalciferol, 2000 units CAPS Take by mouth daily.   Yes [provider]     ALLERGIES:  Allergies  Allergen Reactions  . Shellfish Allergy Swelling    angioedema  . Penicillin  G Rash     SOCIAL HISTORY:  Social History   Social History  . Marital status: Married    Spouse name: N/A  . Number of children: 2  . Years of education: N/A   Occupational History  . Works in [employer] ([place], [place])  [place]  . Smoking status: Never Smoker  . Smokeless tobacco: Never Used  . Alcohol use No  . Drug use: No  . Sexual activity: Not Currently   Other Topics Concern  . Not on file   Social History Narrative  . No narrative on file    The patient currently resides (home / rehab facility / nursing home): Home The patient normally is (ambulatory / bedbound): Ambulatory  FAMILY HISTORY:  Family History  Problem Relation Age of Onset  . Cancer Mother 89       liver cancer  . Diabetes Mother   . Hypertension Mother   . Glomerulonephritis Father   . Diabetes Sister   . Glomerulonephritis Sister   . Cancer Sister 15       throat cancer  . Heart disease Sister   . Diabetes Sister   . Diabetes Sister   . Breast cancer Cousin   . Varicose Veins Neg Hx     Otherwise negative/non-contributory.  REVIEW OF SYSTEMS:  Constitutional: denies any other weight loss, fever, chills, or sweats  Eyes: denies any other vision changes, history of eye injury  ENT: denies sore throat, hearing problems  Respiratory: denies shortness of breath, wheezing  Cardiovascular: denies chest pain, palpitations Breast: pain, nipple discharge, and mass(es) as per HPI Gastrointestinal: denies abdominal pain, N/V, or diarrhea Musculoskeletal: denies any other joint pains or cramps  Skin: Denies any other rashes or skin discolorations Neurological: denies any other headache, dizziness, weakness  Psychiatric: Denies any other depression, anxiety   All other review of systems were otherwise negative   VITAL SIGNS:  BP (!) 165/93   Pulse 73   Temp 97.8 F (36.6 C) (Oral)   Ht 5\' 4"  (1.626 m)   Wt 193 lb (87.5 kg)   BMI 33.13 kg/m   PHYSICAL EXAM:  Constitutional:  -- Obese body habitus  -- Awake, alert, and oriented x3  Eyes:  -- Pupils equally round and reactive to light  -- No scleral icterus  Ear, nose, throat:  -- No jugular venous  distension -- No nasal drainage, bleeding Pulmonary:  -- No crackles  -- Equal breath sounds bilaterally -- Breathing non-labored at rest Cardiovascular:  -- S1, S2 present  -- No pericardial rubs Breast: -- Right upper mid-breast (supra-areolar) punctate wound and RUO quadrant tenderness to palpation, both consistent with recent biopsy, B/L no palpable masses or nipple discharge and B/L no appreciably palpable axillary lymphadenopathy Gastrointestinal:  -- Abdomen soft, nontender, nondistended, no guarding/rebound  -- No abdominal masses appreciated, pulsatile or otherwise  Musculoskeletal and Integumentary:  -- Wounds or skin discoloration: None appreciated except Right breast as above (Breast) -- Extremities: B/L UE and LE FROM, hands and feet warm, no edema  Neurologic:  -- Motor function: Intact and symmetric -- Sensation: Intact and symmetric  Labs:  CBC Latest Ref Rng & Units 10/02/2016  WBC 3.8 - 10.8 K/uL 6.1  Hemoglobin 11.7 - 15.5 g/dL 11.5(L)  Hematocrit 35.0 - 45.0 % 35.3  Platelets 140 - 400 K/uL 243   CMP Latest Ref Rng & Units 11/06/2016 10/02/2016 06/16/2016  Glucose 65 - 99 mg/dL -  84 100(H)  BUN 7 - 25 mg/dL - 16 14  Creatinine 0.50 - 0.99 mg/dL - 1.10(H) 0.98  Sodium 135 - 146 mmol/L - 141 141  Potassium 3.5 - 5.3 mmol/L - 4.4 4.7  Chloride 98 - 110 mmol/L - 106 106  CO2 20 - 31 mmol/L - 24 29  Calcium 8.6 - 10.4 mg/dL - 9.4 9.5  Total Protein 6.5 - 8.1 g/dL 7.6 - 6.8  Total Bilirubin 0.3 - 1.2 mg/dL 0.4 - 0.3  Alkaline Phos 38 - 126 U/L 77 - 76  AST 15 - 41 U/L 22 - 14  ALT 14 - 54 U/L 20 - 11    Imaging studies:  Right Breast Stereotactic Core Needle Biopsy (12/30/2016 with addendum 12/31/2016) ADDENDUM:  A. BREAST, RIGHT, UPPER OUTER QUADRANT; STEREOTACTIC-GUIDED CORE BIOPSY: - DUCTAL CARCINOMA IN SITU (DCIS), INTERMEDIATE NUCLEAR GRADE WITH COMEDONECROSIS, ASSOCIATED WITH CALCIFICATIONS.  In this sample DCIS is in multiple core fragments and  spans up to 5 mm. No invasive carcinoma is identified.  CONCORDANT: YES I telephoned the patient on 12/31/2016 at approximately 1:15 p.m. and discussed these results and the recommendations stated below. All questions were answered. The patient was asked to call the [facility] with any questions or issues related to the biopsy.  RECOMMENDATION: 1. Surgical consultation is recommended. The patient will be contacted by one of the nurse navigators with the referral. 2. Stereotactic guided biopsy of the additional 5 mm group of calcifications within the central, subareolar right breast, middle depth. The patient will be contacted by the breast center to schedule this biopsy.  Bilateral Diagnostic Breast Mammography (12/16/2016) Mammographically, there are no suspicious masses or areas of architectural distortion in either breast. There is a stable group of benign-appearing calcifications in the right breast upper outer quadrant, middle depth, measuring 5 mm. However there is a group of newly apparent calcifications slightly more anteriorly in the upper-outer quadrant of the right breast which measures 12 x 15 x 13 mm. There is no associated mass. Post biopsy changes with cylinder marker is seen at the site of prior benign stereotactic core needle biopsy.  IMPRESSION: BI-RADS CATEGORY 4: Suspicious  Right breast upper outer quadrant, anterior depth, 15 mm of indeterminate calcifications, for which stereotactic core needle biopsy is recommended.  Right breast upper outer quadrant 5 mm stable probably benign calcifications. Continued six-month follow-up is recommended for this group.  Assessment/Plan:  66 y.o. female with asymptomatic Right breast upper outer quadrant DCIS identified as calcifications on recent annual screening mammogram (8/14) with subsequent core needle biopsy (2/97), complicated by co-morbidities including obesity (BMI >33), pre-diabetes, HTN, HLD,  CKD (stage 2), OSA, osteoarthritis, and positive ANA (antinuclear antibody).   - imaging and biopsy results discussed, along with indication for 2nd biopsy tomorrow  - all risks, benefits, and alternatives to lumpectomy/partial mastectomy without SLN biopsy, possible SLN biopsy and possible mastectomy (pending pathology results of 2nd biopsy tomorrow) were discussed with the patient, all of her questions were answered to her expressed satisfaction, patient expresses she wishes to proceed, and informed consent was obtained.   - will schedule for needle/wire-localized excisional biopsy of Right breast upper outer quadrant calcifications without SLN biopsy on 9/28 per patient request with patient expressing understanding will call to update with pathology prior to surgery and may need to ammend surgical plan (+/- SLN biopsy, +/- Right mastectomy) on basis of additional biopsy pathology   - outpatient surgical follow-up 2 weeks following above planned procedure  -  instructed patient to call if any questions or concerns  All of the above recommendations were discussed with the patient, and all of patient's questions were answered to her expressed satisfaction.  Thank you for the opportunity to participate in this patient's care.  -- Marilynne Drivers Rosana Hoes, MD, [place]: [facility] - Partnering for exceptional care. Office: [phone]

## 2017-01-30 NOTE — Interval H&P Note (Signed)
History and Physical Interval Note:  01/30/2017 12:31 PM  Marissa Nelson  has presented today for surgery, with the diagnosis of DUCTAL CARCINOMA RIGHT BREAST INSITU  The various methods of treatment have been discussed with the patient and family. After consideration of risks, benefits and other options for treatment, the patient has consented to  Procedure(s): BREAST LUMPECTOMY WITH NEEDLE LOCALIZATION (Right) as a surgical intervention .  The patient's history has been reviewed, patient examined, no change in status, stable for surgery.  I have reviewed the patient's chart and labs.  Questions were answered to the patient's satisfaction.     Vickie Epley

## 2017-01-30 NOTE — Anesthesia Procedure Notes (Signed)
Procedure Name: LMA Insertion Date/Time: 01/30/2017 1:00 PM Performed by: Marsh Dolly Pre-anesthesia Checklist: Patient identified, Patient being monitored, Timeout performed, Emergency Drugs available and Suction available Patient Re-evaluated:Patient Re-evaluated prior to induction Oxygen Delivery Method: Circle system utilized Preoxygenation: Pre-oxygenation with 100% oxygen Induction Type: IV induction Ventilation: Mask ventilation without difficulty LMA: LMA inserted LMA Size: 3.5 Tube type: Oral Number of attempts: 1 Placement Confirmation: positive ETCO2 and breath sounds checked- equal and bilateral Tube secured with: Tape Dental Injury: Teeth and Oropharynx as per pre-operative assessment

## 2017-01-30 NOTE — Discharge Instructions (Signed)
In addition to included general post-operative instructions for Lumpectomy,  Diet: Resume home heart healthy diet.   Activity: No heavy lifting >15 pounds (children, pets, laundry, garbage) or strenuous activity until follow-up, but light activity and walking are encouraged. Do not drive or drink alcohol if taking narcotic pain medications.  Wound care: 2 days after surgery (Sunday, 9/30), may shower/get incision wet with soapy water and pat dry (do not rub incisions), but no baths or submerging incision underwater until follow-up.   Medications: Resume all home medications. For mild to moderate pain: acetaminophen (Tylenol) or ibuprofen (if no kidney disease). Combining Tylenol with alcohol can substantially increase your risk of causing liver disease. Narcotic pain medications, if prescribed, can be used for severe pain, though may cause nausea, constipation, and drowsiness. Do not combine Tylenol and Percocet within a 6 hour period as Percocet contains Tylenol. If you do not need the narcotic pain medication, you do not need to fill the prescription.  Call office [phone]) at any time if any questions, worsening pain, fevers/chills, bleeding, drainage from incision site, or other concerns.    AMBULATORY SURGERY  DISCHARGE INSTRUCTIONS   1) The drugs that you were given will stay in your system until tomorrow so for the next 24 hours you should not:  A) Drive an automobile B) Make any legal decisions C) Drink any alcoholic beverage   2) You may resume regular meals tomorrow.  Today it is better to start with liquids and gradually work up to solid foods.  You may eat anything you prefer, but it is better to start with liquids, then soup and crackers, and gradually work up to solid foods.   3) Please notify your doctor immediately if you have any unusual bleeding, trouble breathing, redness and pain at the surgery site, drainage, fever, or pain not relieved by  medication.   4) Additional Instructions:Do not pick at the glue holding your incision together, it will fall off on it's own in 2-3 weeks.

## 2017-01-30 NOTE — OR Nursing (Signed)
Pt had dry heaves upon arrival to post-op via stretcher.  Pt had received anti nausea meds in PACU.  Discussed aroma therapy, pt requested peppermint to smell for her nausea, which has been given.

## 2017-01-30 NOTE — Anesthesia Post-op Follow-up Note (Signed)
Anesthesia QCDR form completed.        

## 2017-01-31 ENCOUNTER — Encounter: Payer: Self-pay | Admitting: Surgery

## 2017-02-02 LAB — SURGICAL PATHOLOGY

## 2017-02-06 ENCOUNTER — Ambulatory Visit: Payer: BC Managed Care – PPO | Admitting: Oncology

## 2017-02-10 ENCOUNTER — Inpatient Hospital Stay: Payer: BC Managed Care – PPO | Attending: Oncology | Admitting: Oncology

## 2017-02-10 ENCOUNTER — Ambulatory Visit
Admission: RE | Admit: 2017-02-10 | Discharge: 2017-02-10 | Disposition: A | Discharge status: Home / Self Care | Payer: BC Managed Care – PPO | Source: Ambulatory Visit | Attending: Radiation Oncology | Admitting: Radiation Oncology

## 2017-02-10 ENCOUNTER — Encounter: Payer: Self-pay | Admitting: Oncology

## 2017-02-10 ENCOUNTER — Inpatient Hospital Stay: Payer: BC Managed Care – PPO

## 2017-02-10 ENCOUNTER — Telehealth: Payer: Self-pay | Admitting: Oncology

## 2017-02-10 VITALS — BP 128/77 | HR 81 | Temp 96.9°F | Resp 16 | Wt 194.0 lb

## 2017-02-10 DIAGNOSIS — Z13820 Encounter for screening for osteoporosis: Secondary | ICD-10-CM

## 2017-02-10 DIAGNOSIS — Z90710 Acquired absence of both cervix and uterus: Secondary | ICD-10-CM | POA: Diagnosis not present

## 2017-02-10 DIAGNOSIS — Z170 Estrogen receptor positive status [ER+]: Secondary | ICD-10-CM

## 2017-02-10 DIAGNOSIS — D0511 Intraductal carcinoma in situ of right breast: Secondary | ICD-10-CM | POA: Diagnosis not present

## 2017-02-10 DIAGNOSIS — E785 Hyperlipidemia, unspecified: Secondary | ICD-10-CM | POA: Insufficient documentation

## 2017-02-10 DIAGNOSIS — E559 Vitamin D deficiency, unspecified: Secondary | ICD-10-CM | POA: Insufficient documentation

## 2017-02-10 DIAGNOSIS — Z7982 Long term (current) use of aspirin: Secondary | ICD-10-CM | POA: Insufficient documentation

## 2017-02-10 DIAGNOSIS — I10 Essential (primary) hypertension: Secondary | ICD-10-CM | POA: Insufficient documentation

## 2017-02-10 DIAGNOSIS — E669 Obesity, unspecified: Secondary | ICD-10-CM | POA: Diagnosis not present

## 2017-02-10 DIAGNOSIS — Z780 Asymptomatic menopausal state: Secondary | ICD-10-CM | POA: Diagnosis not present

## 2017-02-10 DIAGNOSIS — M2550 Pain in unspecified joint: Secondary | ICD-10-CM | POA: Insufficient documentation

## 2017-02-10 DIAGNOSIS — Z23 Encounter for immunization: Secondary | ICD-10-CM | POA: Diagnosis not present

## 2017-02-10 DIAGNOSIS — Z79899 Other long term (current) drug therapy: Secondary | ICD-10-CM | POA: Insufficient documentation

## 2017-02-10 DIAGNOSIS — I129 Hypertensive chronic kidney disease with stage 1 through stage 4 chronic kidney disease, or unspecified chronic kidney disease: Secondary | ICD-10-CM | POA: Insufficient documentation

## 2017-02-10 DIAGNOSIS — R7303 Prediabetes: Secondary | ICD-10-CM | POA: Diagnosis not present

## 2017-02-10 DIAGNOSIS — N182 Chronic kidney disease, stage 2 (mild): Secondary | ICD-10-CM | POA: Diagnosis not present

## 2017-02-10 DIAGNOSIS — Z803 Family history of malignant neoplasm of breast: Secondary | ICD-10-CM | POA: Insufficient documentation

## 2017-02-10 DIAGNOSIS — Z510 Encounter for antineoplastic radiation therapy: Secondary | ICD-10-CM | POA: Insufficient documentation

## 2017-02-10 DIAGNOSIS — Z6830 Body mass index (BMI) 30.0-30.9, adult: Secondary | ICD-10-CM | POA: Insufficient documentation

## 2017-02-10 DIAGNOSIS — M25512 Pain in left shoulder: Secondary | ICD-10-CM | POA: Insufficient documentation

## 2017-02-10 DIAGNOSIS — N189 Chronic kidney disease, unspecified: Secondary | ICD-10-CM | POA: Insufficient documentation

## 2017-02-10 MED ORDER — INFLUENZA VAC SPLIT QUAD 0.5 ML IM SUSY
0.5000 mL | PREFILLED_SYRINGE | Freq: Once | INTRAMUSCULAR | Status: AC
  Administered 2017-02-10: 0.5 mL via INTRAMUSCULAR
  Filled 2017-02-10: qty 0.5

## 2017-02-10 NOTE — Telephone Encounter (Signed)
Do not schedule Bone Density test per Judeen Hammans. (VERBAL)

## 2017-02-10 NOTE — Consult Note (Signed)
NEW PATIENT EVALUATION  Name: Marissa Nelson  MRN: [id number]  Date:   02/10/2017     DOB: 06/22/1950   This 66 y.o. 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  REFERRING PHYSICIAN: Lada, Satira Anis, MD  CHIEF COMPLAINT: No chief complaint on file.   DIAGNOSIS: The encounter diagnosis was Ductal carcinoma in situ (DCIS) of right breast.   PREVIOUS INVESTIGATIONS:  Mammograms ultrasound reviewed Pathology reports reviewed Clinical notes reviewed  HPI: patient is a 66 year old female presented in August 2017 with some calcifications in her right breast. There was an upper outer quadrant area and a more central subareolar region. At that time both areas were negative for malignancy. Six-month follow-up mammogram showed a new 50 mm consultation the upper outer quadrant right breast measuring 1.5 cm in greatest dimension. She again went underwent 2 sets of biopsy the right upper quadrant showed intermediate grade ductal carcinoma in situ with comedonecrosis ER/PR positive. The right lower quadrant was fibroadenomas changes with calcifications.she underwent a wide local excisionshowing 1.5 cm of nuclear grade 2 comedonecrosis ductal carcinoma in situ with margins clear at 3 mm. No lymph nodes were submitted. Patient is seen medical oncology and aromatase inhibitor therapy after radiation has been recommended. She is seen today for radiation oncology opinion. She is doing well. She specifically denies breast tenderness cough or bone pain.  PLANNED TREATMENT REGIMEN: right whole breast radiation  PAST MEDICAL HISTORY:  has a past medical history of Abnormal mammogram of right breast (01/17/2016); Arthralgia (10/04/2016); Arthritis; Benign essential HTN (12/07/2006); Breast cancer ([place]); Breast cancer screening (11/15/2015); Chronic kidney disease, stage II (mild)  (11/15/2015); Chronic left shoulder pain (11/08/2014); DCIS (ductal carcinoma in situ) of breast (12/31/2016); Family history of diabetes mellitus (06/12/2016); HLD (hyperlipidemia) (06/15/2015); right breast biopsy (01/17/2016); Hyperlipidemia; Hypertension; Medication monitoring encounter (11/15/2015); Motion sickness; Numbness and tingling in left hand (11/07/2014); Obesity (10/25/2015); Obesity (BMI 30.0-34.9) (10/25/2015); Positive ANA (antinuclear antibody) (10/04/2016); Prediabetes; Preventative health care (11/15/2015); and Vitamin D deficiency (10/29/2015).    PAST SURGICAL HISTORY:  Past Surgical History:  Procedure Laterality Date  . ABDOMINAL HYSTERECTOMY    . BREAST BIOPSY Right 01/15/2016   neg- core  . BREAST BIOPSY Right 12/30/2016   stereo bx for calcifications: DCIS  . BREAST BIOPSY Right 01/08/2017   path pending  . BREAST LUMPECTOMY WITH NEEDLE LOCALIZATION Right 01/30/2017   Procedure: BREAST LUMPECTOMY WITH NEEDLE LOCALIZATION;  Surgeon: Vickie Epley, MD;  Location: [facility];  Service: General;  Laterality: Right;  . COLONOSCOPY WITH PROPOFOL N/A 11/19/2016   Procedure: COLONOSCOPY WITH PROPOFOL;  Surgeon: Jonathon Bellows, MD;  Location: [place];  Service: Endoscopy;  Laterality: N/A;  . ESOPHAGOGASTRODUODENOSCOPY (EGD) WITH PROPOFOL N/A 11/19/2016   Procedure: ESOPHAGOGASTRODUODENOSCOPY (EGD) WITH PROPOFOL;  Surgeon: Jonathon Bellows, MD;  Location: [place];  Service: Endoscopy;  Laterality: N/A;  . GIVENS CAPSULE STUDY N/A 12/12/2016   Procedure: GIVENS CAPSULE STUDY;  Surgeon: Jonathon Bellows, MD;  Location: [facility];  Service: Gastroenterology;  Laterality: N/A;  . TOTAL MASTECTOMY Right 01/30/2017   Procedure: TOTAL MASTECTOMY;  Surgeon: Vickie Epley, MD;  Location: [facility];  Service: General;  Laterality: Right;    FAMILY HISTORY: family history includes Breast cancer in her cousin; COPD in her sister; Cancer (age of onset: 76) in her mother; Cancer (age of  onset: 18) in her sister;  Diabetes in her mother, sister, sister, sister, and sister; Glomerulonephritis in her father and sister; Heart disease in her sister; Hypertension in her mother.  SOCIAL HISTORY:  reports that she has never smoked. She has never used smokeless tobacco. She reports that she does not drink alcohol or use drugs.  ALLERGIES: Shellfish allergy and Penicillin g  MEDICATIONS:  Current Outpatient Prescriptions  Medication Sig Dispense Refill  . acetaminophen (TYLENOL) 325 MG tablet Take 650 mg by mouth every 6 (six) hours as needed for mild pain or headache.     [name] Kitchen aspirin EC 81 MG tablet Take 1 tablet (81 mg total) by mouth daily.    [name] Kitchen losartan-hydrochlorothiazide (HYZAAR) 100-25 MG tablet Take 1 tablet by mouth daily. 90 tablet 3  . oxyCODONE-acetaminophen (ROXICET) 5-325 MG tablet Take 1 tablet by mouth every 4 (four) hours as needed for severe pain. 30 tablet 0  . Vitamin D, Ergocalciferol, 2000 units CAPS Take 2,000 Units by mouth daily.      No current facility-administered medications for this encounter.     ECOG PERFORMANCE STATUS:  0 - Asymptomatic  REVIEW OF SYSTEMS:  Patient denies any weight loss, fatigue, weakness, fever, chills or night sweats. Patient denies any loss of vision, blurred vision. Patient denies any ringing  of the ears or hearing loss. No irregular heartbeat. Patient denies heart murmur or history of fainting. Patient denies any chest pain or pain radiating to her upper extremities. Patient denies any shortness of breath, difficulty breathing at night, cough or hemoptysis. Patient denies any swelling in the lower legs. Patient denies any nausea vomiting, vomiting of blood, or coffee ground material in the vomitus. Patient denies any stomach pain. Patient states has had normal bowel movements no significant constipation or diarrhea. Patient denies any dysuria, hematuria or significant nocturia. Patient denies any problems walking, swelling in the  joints or loss of balance. Patient denies any skin changes, loss of hair or loss of weight. Patient denies any excessive worrying or anxiety or significant depression. Patient denies any problems with insomnia. Patient denies excessive thirst, polyuria, polydipsia. Patient denies any swollen glands, patient denies easy bruising or easy bleeding. Patient denies any recent infections, allergies or URI. Patient "s visual fields have not changed significantly in recent time.    PHYSICAL EXAM: There were no vitals taken for this visit. Right breast is wide local excision scar which is healing well. No dominant mass or nodularity is noted in either breast in 2 positions examined. No axillary or supraclavicular adenopathy is identified. Well-developed well-nourished patient in NAD. HEENT reveals PERLA, EOMI, discs not visualized.  Oral cavity is clear. No oral mucosal lesions are identified. Neck is clear without evidence of cervical or supraclavicular adenopathy. Lungs are clear to A&P. Cardiac examination is essentially unremarkable with regular rate and rhythm without murmur rub or thrill. Abdomen is benign with no organomegaly or masses noted. Motor sensory and DTR levels are equal and symmetric in the upper and lower extremities. Cranial nerves II through XII are grossly intact. Proprioception is intact. No peripheral adenopathy or edema is identified. No motor or sensory levels are noted. Crude visual fields are within normal range.  LABORATORY DATA: pathology reports reviewed and compatible with the above-stated findings    RADIOLOGY RESULTS:mammograms and ultrasound reviewed   IMPRESSION: ductal carcinoma in situ of the right breast status post wide local excision ER/PR positive and 66 year old female  PLAN: at this time I to go ahead with whole breast radiation to her right  breast. I believe her breast is quite large and may not fit our parameters to do hypofractionated course of treatment. If that  is the case will go ahead with 5040 cGy in 28 fractions boosting her scar another 1400 cGy using electron beam. Risks and benefits of treatment including skin reaction fatigue alteration of blood counts possible inclusion of superficial lung all were discussed in detail with the patient. She seems to comprehend our treatment plan well. I have personally set up and ordered CT simulation for early next week. Patient also will be a candidate for aromatase inhibitor therapy after completion of radiation.  I would like to take this opportunity to thank you for allowing me to participate in the care of your patient.Armstead Peaks., MD

## 2017-02-10 NOTE — Progress Notes (Addendum)
Hematology/Oncology Consult note [facility]  Telephone:([phone] [phone]  Patient Care Team: Arnetha Courser, MD as PCP - General (Family Medicine)   Name of the patient: Marissa Nelson  [id number]  02-08-1951   Date of visit: 02/10/17  Diagnosis- right breast DCIS ER + s/p lumpectomy  Chief complaint/ Reason for visit- discuss pathology results and further management  Heme/Onc history: 1. Patient is a 66 year old African-American female with no significant comorbidities other than hypertension. She underwent screening bilateral mammogram in August 2017 which showed calcifications in her right breast. This was followed by a diagnostic mammogram which showed 2 sets of calcifications: 1 was 4 mm in the upper outer right breast and an additional 7 mm calcification in the central subareolar right breast. The 4 mm group of calcifications was biopsies and was negative for malignancy. Repeat mammogram was recommended in 6 months. This mammogram showed a new 15 mm calcifications in the upper outer quadrant of the right breast measuring 12 x 15 x 13 mm. Stable group of benign-appearing calcifications in the right breast upper outer quadrant measuring 5 mm.  2. Patient underwent 2 sets of biopsies. Biopsies from the right upper outer quadrant showed intermediate grade DCIS with comedonecrosis and associated calcifications. ER greater than 90% positive and PR was 11-50% positive. Biopsy of the right lower quadrant of the right breast showed fibroadenomatous changes with calcifications.  3. Patient has been seen by Dr. Rosana Hoes and is scheduled to undergo lumpectomy on 01/30/2017  4. Patient had menarche at the age of 71. 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 History of breast cancer in one of her sisters. She is G2 P2 L2. She did not  breast-feed.  5. Patient underwent lumpectomy on 01/30/2017 which showed a 15 mm DCIS, grade 2 with comedonecrosis. Margins were -3 mm from the closest medial margin lymph nodes were not submitted. Pathology stage pTisNx. ER greater than 90% positive PR 11-50% positive   Interval history- reports some itching at the lumpectomy site. denies other complaints  ECOG PS- 0 Pain scale- 0   Review of systems- Review of Systems  Constitutional: Negative for chills, fever, malaise/fatigue and weight loss.  HENT: Negative for congestion, ear discharge and nosebleeds.   Eyes: Negative for blurred vision.  Respiratory: Negative for cough, hemoptysis, sputum production, shortness of breath and wheezing.   Cardiovascular: Negative for chest pain, palpitations, orthopnea and claudication.  Gastrointestinal: Negative for abdominal pain, blood in stool, constipation, diarrhea, heartburn, melena, nausea and vomiting.  Genitourinary: Negative for dysuria, flank pain, frequency, hematuria and urgency.  Musculoskeletal: Negative for back pain, joint pain and myalgias.  Skin: Negative for rash.  Neurological: Negative for dizziness, tingling, focal weakness, seizures, weakness and headaches.  Endo/Heme/Allergies: Does not bruise/bleed easily.  Psychiatric/Behavioral: Negative for depression and suicidal ideas. The patient does not have insomnia.       Allergies  Allergen Reactions  . Shellfish Allergy Swelling    angioedema  . Penicillin G Rash    Has patient had a PCN reaction causing immediate rash, facial/tongue/throat swelling, SOB or lightheadedness with hypotension: Yes Has patient had a PCN reaction causing severe rash involving mucus membranes or skin necrosis: Yes Has patient had a PCN reaction that required hospitalization: No Has patient had a PCN reaction occurring within the last 10 years: No If all of the above answers are "NO",  then may proceed with Cephalosporin use.      Past  Medical History:  Diagnosis Date  . Abnormal mammogram of right breast 01/17/2016   Recommendation: Six month follow-up diagnostic mammogram of the right breast for an additional group of probably benign Calcifications. Sept 2017 --> due March 2018  . Arthralgia 10/04/2016  . Arthritis    hands  . Benign essential HTN 12/07/2006  . Breast cancer screening 11/15/2015  . Chronic kidney disease, stage II (mild) 11/15/2015  . Chronic left shoulder pain 11/08/2014  . 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 Family history of diabetes mellitus 06/12/2016   Mother and several other relatives  . HLD (hyperlipidemia) 06/15/2015  . Hx of right breast biopsy 01/17/2016  . Hyperlipidemia   . Hypertension   . Medication monitoring encounter 11/15/2015  . Motion sickness    ocean ships  . Numbness and tingling in left hand 11/07/2014  . Obesity 10/25/2015  . Obesity (BMI 30.0-34.9) 10/25/2015  . Positive ANA (antinuclear antibody) 10/04/2016  . Prediabetes   . Preventative health care 11/15/2015  . Vitamin D deficiency 10/29/2015     Past Surgical History:  Procedure Laterality Date  . ABDOMINAL HYSTERECTOMY    . BREAST BIOPSY Right 01/15/2016   neg- core  . BREAST BIOPSY Right 12/30/2016   stereo bx for calcifications: DCIS  . BREAST BIOPSY Right 01/08/2017   path pending  . BREAST LUMPECTOMY WITH NEEDLE LOCALIZATION Right 01/30/2017   Procedure: BREAST LUMPECTOMY WITH NEEDLE LOCALIZATION;  Surgeon: Vickie Epley, MD;  Location: [facility];  Service: General;  Laterality: Right;  . COLONOSCOPY WITH PROPOFOL N/A 11/19/2016   Procedure: COLONOSCOPY WITH PROPOFOL;  Surgeon: Jonathon Bellows, MD;  Location: [place];  Service: Endoscopy;  Laterality: N/A;  . ESOPHAGOGASTRODUODENOSCOPY (EGD) WITH PROPOFOL N/A 11/19/2016   Procedure: ESOPHAGOGASTRODUODENOSCOPY (EGD) WITH PROPOFOL;  Surgeon: Jonathon Bellows, MD;  Location: [place];  Service: Endoscopy;  Laterality:  N/A;  . GIVENS CAPSULE STUDY N/A 12/12/2016   Procedure: GIVENS CAPSULE STUDY;  Surgeon: Jonathon Bellows, MD;  Location: [facility] END[facility]COPY;  Service: Gastroenterology;  Laterality: N/A;  . TOTAL MASTECTOMY Right 01/30/2017   Procedure: TOTAL MASTECTOMY;  Surgeon: Vickie Epley, MD;  Location: [facility];  Service: General;  Laterality: Right;    Social History   Social History  . Marital status: Married    Spouse name: N/A  . Number of children: 2  . Years of education: N/A   Occupational History  . Works in [employer] ([place], [place]) [place]  . Smoking status: Never Smoker  . Smokeless tobacco: Never Used  . Alcohol use No  . Drug use: No  . Sexual activity: Not Currently   Other Topics Concern  . Not on file   Social History Narrative  . No narrative on file    Family History  Problem Relation Age of Onset  . Cancer Mother 1       liver cancer  . Diabetes Mother   . Hypertension Mother   . Glomerulonephritis Father   . Diabetes Sister   . Glomerulonephritis Sister   . Cancer Sister 18       throat cancer  . Heart disease Sister   . Diabetes Sister   . Diabetes Sister   . COPD Sister   . Diabetes Sister   . Breast cancer Cousin  Currently undergoing systemic chemotherapy (01/2017)  . Varicose Veins Neg Hx      Current Outpatient Prescriptions:  .  acetaminophen (TYLENOL) 325 MG tablet, Take 650 mg by mouth every 6 (six) hours as needed for mild pain or headache. , Disp: , Rfl:  .  aspirin EC 81 MG tablet, Take 1 tablet (81 mg total) by mouth daily., Disp: , Rfl:  .  losartan-hydrochlorothiazide (HYZAAR) 100-25 MG tablet, Take 1 tablet by mouth daily., Disp: 90 tablet, Rfl: 3 .  oxyCODONE-acetaminophen (ROXICET) 5-325 MG tablet, Take 1 tablet by mouth every 4 (four) hours as needed for severe pain., Disp: 30 tablet, Rfl: 0 .  Vitamin D, Ergocalciferol, 2000 units CAPS, Take 2,000 Units by mouth daily. ,  Disp: , Rfl:   Physical exam:  Vitals:   02/10/17 1331  BP: 128/77  Pulse: 81  Resp: 16  Temp: (!) 96.9 F (36.1 C)  TempSrc: Tympanic  Weight: 194 lb (88 kg)   Physical Exam  Constitutional: She is oriented to person, place, and time and well-developed, well-nourished, and in no distress.  HENT:  Head: Normocephalic and atraumatic.  Eyes: Pupils are equal, round, and reactive to light. EOM are normal.  Neck: Normal range of motion.  Cardiovascular: Normal rate and regular rhythm.   Systolic murmur +  Pulmonary/Chest: Effort normal and breath sounds normal.  Abdominal: Soft. Bowel sounds are normal.  Neurological: She is alert and oriented to person, place, and time.  Skin: Skin is warm and dry.   s/p right lumpectomy with healed surgical scar  CMP Latest Ref Rng & Units 01/27/2017  Glucose 65 - 99 mg/dL 79  BUN 6 - 20 mg/dL 18  Creatinine 0.44 - 1.00 mg/dL 1.01(H)  Sodium 135 - 145 mmol/L 138  Potassium 3.5 - 5.1 mmol/L 3.8  Chloride 101 - 111 mmol/L 103  CO2 22 - 32 mmol/L 26  Calcium 8.9 - 10.3 mg/dL 9.2  Total Protein 6.5 - 8.1 g/dL -  Total Bilirubin 0.3 - 1.2 mg/dL -  Alkaline Phos 38 - 126 U/L -  AST 15 - 41 U/L -  ALT 14 - 54 U/L -   CBC Latest Ref Rng & Units 01/27/2017  WBC 3.8 - 10.8 K/uL -  Hemoglobin 12.0 - 16.0 g/dL 12.2  Hematocrit 35.0 - 45.0 % -  Platelets 140 - 400 K/uL -    No images are attached to the encounter.  Mm Breast Surgical Specimen  Result Date: 01/30/2017 CLINICAL DATA:  Lumpectomy was performed of the right breast. The specimen is sent for radiography. EXAM: SPECIMEN RADIOGRAPH OF THE RIGHT BREAST COMPARISON:  Previous exam(s). FINDINGS: Status post excision of the right breast. The wire tip and biopsy marker clip are present and are marked for pathology. IMPRESSION: Specimen radiograph of the right breast. Electronically Signed   By: Curlene Dolphin M.D.   On: 01/30/2017 14:02   Mm Rt Plc Breast Loc Dev   1st Lesion  Inc Mammo  Guide  Result Date: 01/30/2017 CLINICAL DATA:  Needle localization was requested prior to lumpectomy. Recent diagnosis of ductal carcinoma in situ following stereotactic biopsy of calcifications in the upper slightly outer right breast. A ribbon shaped biopsy clip was placed at the site of the stereotactic biopsy where ductal carcinoma in situ was diagnosed. EXAM: NEEDLE LOCALIZATION OF THE RIGHT BREAST WITH MAMMO GUIDANCE COMPARISON:  Previous exams. FINDINGS: Patient presents for needle localization prior to lumpectomy. I met with the patient and we discussed the procedure  of needle localization including benefits and alternatives. We discussed the high likelihood of a successful procedure. We discussed the risks of the procedure, including infection, bleeding, tissue injury, and further surgery. Informed, written consent was given. The usual time-out protocol was performed immediately prior to the procedure. Using mammographic guidance, sterile technique, 1% lidocaine and a 7 cm modified Kopans needle, a ribbon shaped biopsy clip in the upper slightly outer right breast localized using a superior to inferior approach. The images were marked for Dr. Rosana Hoes. IMPRESSION: Needle localization right breast. No apparent complications. Electronically Signed   By: Curlene Dolphin M.D.   On: 01/30/2017 12:18     Assessment and plan- Patient is a 66 y.o. female with right breast DCIS ER +  Discussed pathology results with patient in detail. She had a grade 2 DCIS with comedonecrosis and negative margins. No role for chemotherapy in DCIS  She will need adjuvant radiation therapy at this time and will be seeing rad onc later today.   Given that she has ER + DCIS, there will be a role for hormone therapy for 5 years. Since she is post menopausal, I would recommend aromatase inhibitors over tamoxifen. Discussed risks and benefits of arimidex including all but not limited to hot flashes, mood swings, arthraligias,  hypercholestrolemia and worsening bone health. Patient understands and agrees to proceed as planned upon completion of radiation. She will call us to let us know when she is finishing radiation and we will prescribe arimidex at that time. She will also need to be on calcium 1200 mg along with Vit D 800 IU. Baseline bone density scan from August 2017 was normal  I will see her back in 2 months from now with cmp.    Visit Diagnosis 1. Ductal carcinoma in situ (DCIS) of right breast      Dr. Randa Evens, MD, MPH [facility] Pager- [phone] 02/10/2017 2:01 PM

## 2017-02-10 NOTE — Progress Notes (Signed)
Patient states that she is doing very well. She denies having any pain. She would like to get the flu vaccine today.

## 2017-02-12 ENCOUNTER — Telehealth: Payer: Self-pay

## 2017-02-12 ENCOUNTER — Ambulatory Visit (INDEPENDENT_AMBULATORY_CARE_PROVIDER_SITE_OTHER): Payer: BC Managed Care – PPO | Admitting: Surgery

## 2017-02-12 ENCOUNTER — Encounter: Payer: Self-pay | Admitting: Surgery

## 2017-02-12 VITALS — BP 166/86 | HR 84 | Temp 97.7°F | Wt 195.0 lb

## 2017-02-12 DIAGNOSIS — D0511 Intraductal carcinoma in situ of right breast: Secondary | ICD-10-CM

## 2017-02-12 NOTE — Patient Instructions (Signed)
GENERAL POST-OPERATIVE PATIENT INSTRUCTIONS   WOUND CARE INSTRUCTIONS:  Keep a dry clean dressing on the wound if there is drainage. The initial bandage may be removed after 24 hours.  Once the wound has quit draining you may leave it open to air.  If clothing rubs against the wound or causes irritation and the wound is not draining you may cover it with a dry dressing during the daytime.  Try to keep the wound dry and avoid ointments on the wound unless directed to do so.  If the wound becomes bright red and painful or starts to drain infected material that is not clear, please contact your physician immediately.  If the wound is mildly pink and has a thick firm ridge underneath it, this is normal, and is referred to as a healing ridge.  This will resolve over the next 4-6 weeks.  BATHING: You may shower if you have been informed of this by your surgeon. However, Please do not submerge in a tub, hot tub, or pool until incisions are completely sealed or have been told by your surgeon that you may do so.  DIET:  You may eat any foods that you can tolerate.  It is a good idea to eat a high fiber diet and take in plenty of fluids to prevent constipation.  If you do become constipated you may want to take a mild laxative or take ducolax tablets on a daily basis until your bowel habits are regular.  Constipation can be very uncomfortable, along with straining, after recent surgery.  ACTIVITY:  You are encouraged to cough and deep breath or use your incentive spirometer if you were given one, every 15-30 minutes when awake.  This will help prevent respiratory complications and low grade fevers post-operatively if you had a general anesthetic.  You may want to hug a pillow when coughing and sneezing to add additional support to the surgical area, if you had abdominal or chest surgery, which will decrease pain during these times.  You are encouraged to walk and engage in light activity for the next two weeks.  You  should not lift more than 20 pounds, until 02/12/2017 as it could put you at increased risk for complications.  Twenty pounds is roughly equivalent to a plastic bag of groceries. At that time- Listen to your body when lifting, if you have pain when lifting, stop and then try again in a few days. Soreness after doing exercises or activities of daily living is normal as you get back in to your normal routine.  MEDICATIONS:  Try to take narcotic medications and anti-inflammatory medications, such as tylenol, ibuprofen, naprosyn, etc., with food.  This will minimize stomach upset from the medication.  Should you develop nausea and vomiting from the pain medication, or develop a rash, please discontinue the medication and contact your physician.  You should not drive, make important decisions, or operate machinery when taking narcotic pain medication.  SUNBLOCK Use sun block to incision area over the next year if this area will be exposed to sun. This helps decrease scarring and will allow you avoid a permanent darkened area over your incision.  QUESTIONS:  Please feel free to call our office if you have any questions, and we will be glad to assist you. [phone]    

## 2017-02-12 NOTE — Telephone Encounter (Signed)
Patient's disability forms were updated and faxed to [phone] with the requested information.

## 2017-02-12 NOTE — Progress Notes (Signed)
Surgical Clinic Progress/Follow-up Note   HPI:  66 y.o. Female presents to clinic for post-op follow-up evaluation 2 weeks s/p Right breast lumpectomy for DCIS. Patient reports never having had any pain following surgery and did not fill prescription for pain meds, denies fever/chills, CP, or SOB. Patient also says she has started taking an aromatase inhibitor and is already scheduled for radiation therapy to start next week.  Review of Systems:  Constitutional: denies any other weight loss, fever, chills, or sweats  Eyes: denies any other vision changes, history of eye injury  ENT: denies sore throat, hearing problems  Respiratory: denies shortness of breath, wheezing  Cardiovascular: denies chest pain, palpitations  Breast: as per HPI Gastrointestinal: denies abdominal pain, N/V, or diarrhea Musculoskeletal: denies any other joint pains or cramps  Skin: Denies any other rashes or skin discolorations  Neurological: denies any other headache, dizziness, weakness  Psychiatric: denies any other depression, anxiety  All other review of systems: otherwise negative   Vital Signs:  BP (!) 166/86   Pulse 84   Temp 97.7 F (36.5 C) (Oral)   Wt 195 lb (88.5 kg)   BMI 35.67 kg/m    Physical Exam:  Constitutional:  -- Overweight body habitus  -- Awake, alert, and oriented x3  Eyes:  -- Pupils equally round and reactive to light  -- No scleral icterus  Ear, nose, throat:  -- No jugular venous distension  -- No nasal drainage, bleeding Pulmonary:  -- No crackles -- Equal breath sounds bilaterally -- Breathing non-labored at rest Cardiovascular:  -- S1, S2 present  -- No pericardial rubs  Gastrointestinal:  -- Soft, nontender, non-distended, no guarding/rebound  -- No abdominal masses appreciated, pulsatile or otherwise  Musculoskeletal / Integumentary:  -- Wounds or skin discoloration: None appreciated except as described above (Breast)  -- Extremities: B/L UE and LE FROM,  hands and feet warm, no edema  Neurologic:  -- Motor function: intact and symmetric  -- Sensation: intact and symmetric   Pathology:   Size (Extent) of DCIS: at least 15 mm  Histologic Type: Ductal carcinoma in situ (DCIS)  Nuclear Grade: 2  Necrosis: Comedonecrosis (identified on biopsy)  Margins: Negative, 3 mm to closest medial margin  Regional Lymph nodes: Lymph nodes not submitted or found  Pathologic Stage Classification (pTNM, AJCC 8th Edition): pTis (DCIS) pNx  Estrogen Receptor (ER) Status: POSITIVE, greater than 90% of cells with  nuclear positivity     Average intensity of staining: Strong   Progesterone Receptor (PgR) Status: POSITIVE, 11-50% of cells with  nuclear positivity     Average intensity of staining: Moderate  Assessment:  66 y.o. yo Female with a problem list including...  Patient Active Problem List   Diagnosis Date Noted  . Ductal carcinoma in situ of right breast   . 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 Positive ANA (antinuclear antibody) 10/04/2016  . Arthralgia 10/04/2016  . Prediabetes 06/20/2016  . Family history of diabetes mellitus 06/12/2016  . Hx of right breast biopsy 01/17/2016  . Abnormal mammogram of right breast 01/17/2016  . Breast cancer screening 11/15/2015  . Medication monitoring encounter 11/15/2015  . Preventative health care 11/15/2015  . Chronic kidney disease, stage II (mild) 11/15/2015  . Vitamin D deficiency 10/29/2015  . Obesity 10/25/2015  . HLD (hyperlipidemia) 06/15/2015  . Obstructive apnea 06/15/2015  . Chronic left shoulder pain 11/08/2014  . Numbness and tingling in left hand 11/07/2014  . Benign essential HTN 12/07/2006  presents to clinic for post-op follow-up evaluation, doing well s/p Right breast lumpectomy for DCIS with ER+ PR+ 15 mm DCIS on final surgical pathology.  Plan:   - okay to gradually resume activities without restrictions   - may shower and submerge incisions  under water (baths)  - instructed to call office if any questions or concerns  - mammogram scheduled for 6 months  All of the above recommendations were discussed with the patient, and all of patient's questions were answered to her expressed satisfaction.  -- Marilynne Drivers Rosana Hoes, MD, [place]: [facility] - Partnering for exceptional care. Office: [phone]

## 2017-02-13 NOTE — Telephone Encounter (Signed)
error 

## 2017-02-13 NOTE — Telephone Encounter (Signed)
Error

## 2017-02-16 ENCOUNTER — Ambulatory Visit
Admission: RE | Admit: 2017-02-16 | Discharge: 2017-02-16 | Disposition: A | Discharge status: Home / Self Care | Payer: BC Managed Care – PPO | Source: Ambulatory Visit | Attending: Radiation Oncology | Admitting: Radiation Oncology

## 2017-02-16 DIAGNOSIS — D0511 Intraductal carcinoma in situ of right breast: Secondary | ICD-10-CM | POA: Diagnosis not present

## 2017-02-16 DIAGNOSIS — N189 Chronic kidney disease, unspecified: Secondary | ICD-10-CM | POA: Diagnosis not present

## 2017-02-16 DIAGNOSIS — M2550 Pain in unspecified joint: Secondary | ICD-10-CM | POA: Diagnosis not present

## 2017-02-16 DIAGNOSIS — E669 Obesity, unspecified: Secondary | ICD-10-CM | POA: Diagnosis not present

## 2017-02-16 DIAGNOSIS — Z7982 Long term (current) use of aspirin: Secondary | ICD-10-CM | POA: Diagnosis not present

## 2017-02-16 DIAGNOSIS — Z79899 Other long term (current) drug therapy: Secondary | ICD-10-CM | POA: Diagnosis not present

## 2017-02-16 DIAGNOSIS — E559 Vitamin D deficiency, unspecified: Secondary | ICD-10-CM | POA: Diagnosis not present

## 2017-02-16 DIAGNOSIS — I10 Essential (primary) hypertension: Secondary | ICD-10-CM | POA: Diagnosis not present

## 2017-02-16 DIAGNOSIS — Z803 Family history of malignant neoplasm of breast: Secondary | ICD-10-CM | POA: Diagnosis not present

## 2017-02-16 DIAGNOSIS — E785 Hyperlipidemia, unspecified: Secondary | ICD-10-CM | POA: Diagnosis not present

## 2017-02-16 DIAGNOSIS — M25512 Pain in left shoulder: Secondary | ICD-10-CM | POA: Diagnosis not present

## 2017-02-16 DIAGNOSIS — Z510 Encounter for antineoplastic radiation therapy: Secondary | ICD-10-CM | POA: Diagnosis not present

## 2017-02-16 DIAGNOSIS — Z170 Estrogen receptor positive status [ER+]: Secondary | ICD-10-CM | POA: Diagnosis not present

## 2017-02-16 DIAGNOSIS — I129 Hypertensive chronic kidney disease with stage 1 through stage 4 chronic kidney disease, or unspecified chronic kidney disease: Secondary | ICD-10-CM | POA: Diagnosis not present

## 2017-02-17 DIAGNOSIS — D0511 Intraductal carcinoma in situ of right breast: Secondary | ICD-10-CM | POA: Diagnosis not present

## 2017-02-20 ENCOUNTER — Other Ambulatory Visit: Payer: Self-pay | Admitting: *Deleted

## 2017-02-20 DIAGNOSIS — D0511 Intraductal carcinoma in situ of right breast: Secondary | ICD-10-CM

## 2017-02-23 ENCOUNTER — Ambulatory Visit: Admission: RE | Admit: 2017-02-23 | Payer: BC Managed Care – PPO | Source: Ambulatory Visit

## 2017-02-24 ENCOUNTER — Ambulatory Visit
Admission: RE | Admit: 2017-02-24 | Discharge: 2017-02-24 | Disposition: A | Discharge status: Home / Self Care | Payer: BC Managed Care – PPO | Source: Ambulatory Visit | Attending: Radiation Oncology | Admitting: Radiation Oncology

## 2017-02-24 ENCOUNTER — Ambulatory Visit: Payer: BC Managed Care – PPO

## 2017-02-24 DIAGNOSIS — D0511 Intraductal carcinoma in situ of right breast: Secondary | ICD-10-CM | POA: Diagnosis not present

## 2017-02-25 ENCOUNTER — Ambulatory Visit
Admission: RE | Admit: 2017-02-25 | Discharge: 2017-02-25 | Disposition: A | Discharge status: Home / Self Care | Payer: BC Managed Care – PPO | Source: Ambulatory Visit | Attending: Radiation Oncology | Admitting: Radiation Oncology

## 2017-02-25 DIAGNOSIS — D0511 Intraductal carcinoma in situ of right breast: Secondary | ICD-10-CM | POA: Diagnosis not present

## 2017-02-26 ENCOUNTER — Ambulatory Visit
Admission: RE | Admit: 2017-02-26 | Discharge: 2017-02-26 | Disposition: A | Discharge status: Home / Self Care | Payer: BC Managed Care – PPO | Source: Ambulatory Visit | Attending: Radiation Oncology | Admitting: Radiation Oncology

## 2017-02-26 DIAGNOSIS — D0511 Intraductal carcinoma in situ of right breast: Secondary | ICD-10-CM | POA: Diagnosis not present

## 2017-02-27 ENCOUNTER — Ambulatory Visit
Admission: RE | Admit: 2017-02-27 | Discharge: 2017-02-27 | Disposition: A | Discharge status: Home / Self Care | Payer: BC Managed Care – PPO | Source: Ambulatory Visit | Attending: Radiation Oncology | Admitting: Radiation Oncology

## 2017-02-27 DIAGNOSIS — D0511 Intraductal carcinoma in situ of right breast: Secondary | ICD-10-CM | POA: Diagnosis not present

## 2017-03-02 ENCOUNTER — Ambulatory Visit
Admission: RE | Admit: 2017-03-02 | Discharge: 2017-03-02 | Disposition: A | Discharge status: Home / Self Care | Payer: BC Managed Care – PPO | Source: Ambulatory Visit | Attending: Radiation Oncology | Admitting: Radiation Oncology

## 2017-03-02 DIAGNOSIS — D0511 Intraductal carcinoma in situ of right breast: Secondary | ICD-10-CM | POA: Diagnosis not present

## 2017-03-03 ENCOUNTER — Ambulatory Visit
Admission: RE | Admit: 2017-03-03 | Discharge: 2017-03-03 | Disposition: A | Discharge status: Home / Self Care | Payer: BC Managed Care – PPO | Source: Ambulatory Visit | Attending: Radiation Oncology | Admitting: Radiation Oncology

## 2017-03-03 DIAGNOSIS — D0511 Intraductal carcinoma in situ of right breast: Secondary | ICD-10-CM | POA: Diagnosis not present

## 2017-03-04 ENCOUNTER — Ambulatory Visit
Admission: RE | Admit: 2017-03-04 | Discharge: 2017-03-04 | Disposition: A | Discharge status: Home / Self Care | Payer: BC Managed Care – PPO | Source: Ambulatory Visit | Attending: Radiation Oncology | Admitting: Radiation Oncology

## 2017-03-04 DIAGNOSIS — D0511 Intraductal carcinoma in situ of right breast: Secondary | ICD-10-CM | POA: Diagnosis not present

## 2017-03-05 ENCOUNTER — Ambulatory Visit
Admission: RE | Admit: 2017-03-05 | Discharge: 2017-03-05 | Disposition: A | Discharge status: Home / Self Care | Payer: BC Managed Care – PPO | Source: Ambulatory Visit | Attending: Radiation Oncology | Admitting: Radiation Oncology

## 2017-03-05 DIAGNOSIS — D0511 Intraductal carcinoma in situ of right breast: Secondary | ICD-10-CM | POA: Diagnosis not present

## 2017-03-06 ENCOUNTER — Ambulatory Visit
Admission: RE | Admit: 2017-03-06 | Discharge: 2017-03-06 | Disposition: A | Discharge status: Home / Self Care | Payer: BC Managed Care – PPO | Source: Ambulatory Visit | Attending: Radiation Oncology | Admitting: Radiation Oncology

## 2017-03-06 DIAGNOSIS — D0511 Intraductal carcinoma in situ of right breast: Secondary | ICD-10-CM | POA: Diagnosis not present

## 2017-03-09 ENCOUNTER — Ambulatory Visit: Payer: BC Managed Care – PPO

## 2017-03-09 ENCOUNTER — Encounter: Payer: Self-pay | Admitting: Surgery

## 2017-03-09 ENCOUNTER — Telehealth: Payer: Self-pay | Admitting: Surgery

## 2017-03-09 NOTE — Telephone Encounter (Signed)
Patient has called and would like a letter for her to return to work on 03/16/17 with no restrictions. She will need this for [employer] human [employer]. Patient will come to the office this afternoon to pick this up.

## 2017-03-09 NOTE — Telephone Encounter (Signed)
Patient's letter will be left at the front desk. Patient is aware.

## 2017-03-10 ENCOUNTER — Ambulatory Visit
Admission: RE | Admit: 2017-03-10 | Discharge: 2017-03-10 | Disposition: A | Discharge status: Home / Self Care | Payer: BC Managed Care – PPO | Source: Ambulatory Visit | Attending: Radiation Oncology | Admitting: Radiation Oncology

## 2017-03-10 DIAGNOSIS — D0511 Intraductal carcinoma in situ of right breast: Secondary | ICD-10-CM | POA: Diagnosis not present

## 2017-03-11 ENCOUNTER — Inpatient Hospital Stay: Payer: BC Managed Care – PPO | Attending: Oncology

## 2017-03-11 ENCOUNTER — Encounter (INDEPENDENT_AMBULATORY_CARE_PROVIDER_SITE_OTHER): Payer: Self-pay

## 2017-03-11 ENCOUNTER — Ambulatory Visit
Admission: RE | Admit: 2017-03-11 | Discharge: 2017-03-11 | Disposition: A | Discharge status: Home / Self Care | Payer: BC Managed Care – PPO | Source: Ambulatory Visit | Attending: Radiation Oncology | Admitting: Radiation Oncology

## 2017-03-11 DIAGNOSIS — E669 Obesity, unspecified: Secondary | ICD-10-CM | POA: Insufficient documentation

## 2017-03-11 DIAGNOSIS — D0511 Intraductal carcinoma in situ of right breast: Secondary | ICD-10-CM | POA: Insufficient documentation

## 2017-03-11 DIAGNOSIS — Z79899 Other long term (current) drug therapy: Secondary | ICD-10-CM | POA: Insufficient documentation

## 2017-03-11 DIAGNOSIS — Z170 Estrogen receptor positive status [ER+]: Secondary | ICD-10-CM | POA: Insufficient documentation

## 2017-03-11 DIAGNOSIS — Z90710 Acquired absence of both cervix and uterus: Secondary | ICD-10-CM | POA: Insufficient documentation

## 2017-03-11 DIAGNOSIS — E559 Vitamin D deficiency, unspecified: Secondary | ICD-10-CM | POA: Insufficient documentation

## 2017-03-11 DIAGNOSIS — I10 Essential (primary) hypertension: Secondary | ICD-10-CM | POA: Diagnosis not present

## 2017-03-11 DIAGNOSIS — R7303 Prediabetes: Secondary | ICD-10-CM | POA: Insufficient documentation

## 2017-03-11 DIAGNOSIS — Z780 Asymptomatic menopausal state: Secondary | ICD-10-CM | POA: Insufficient documentation

## 2017-03-11 DIAGNOSIS — N182 Chronic kidney disease, stage 2 (mild): Secondary | ICD-10-CM | POA: Insufficient documentation

## 2017-03-11 DIAGNOSIS — Z7982 Long term (current) use of aspirin: Secondary | ICD-10-CM | POA: Insufficient documentation

## 2017-03-11 DIAGNOSIS — Z6830 Body mass index (BMI) 30.0-30.9, adult: Secondary | ICD-10-CM | POA: Insufficient documentation

## 2017-03-11 DIAGNOSIS — E785 Hyperlipidemia, unspecified: Secondary | ICD-10-CM | POA: Diagnosis not present

## 2017-03-11 LAB — CBC
HCT: 35.1 % (ref 35.0–47.0)
HEMOGLOBIN: 11.9 g/dL — AB (ref 12.0–16.0)
MCH: 31.6 pg (ref 26.0–34.0)
MCHC: 33.9 g/dL (ref 32.0–36.0)
MCV: 93.2 fL (ref 80.0–100.0)
PLATELETS: 227 10*3/uL (ref 150–440)
RBC: 3.77 MIL/uL — AB (ref 3.80–5.20)
RDW: 13.5 % (ref 11.5–14.5)
WBC: 5.8 10*3/uL (ref 3.6–11.0)

## 2017-03-12 ENCOUNTER — Ambulatory Visit
Admission: RE | Admit: 2017-03-12 | Discharge: 2017-03-12 | Disposition: A | Discharge status: Home / Self Care | Payer: BC Managed Care – PPO | Source: Ambulatory Visit | Attending: Radiation Oncology | Admitting: Radiation Oncology

## 2017-03-12 DIAGNOSIS — D0511 Intraductal carcinoma in situ of right breast: Secondary | ICD-10-CM | POA: Diagnosis not present

## 2017-03-13 ENCOUNTER — Ambulatory Visit
Admission: RE | Admit: 2017-03-13 | Discharge: 2017-03-13 | Disposition: A | Discharge status: Home / Self Care | Payer: BC Managed Care – PPO | Source: Ambulatory Visit | Attending: Radiation Oncology | Admitting: Radiation Oncology

## 2017-03-13 DIAGNOSIS — D0511 Intraductal carcinoma in situ of right breast: Secondary | ICD-10-CM | POA: Diagnosis not present

## 2017-03-16 ENCOUNTER — Ambulatory Visit
Admission: RE | Admit: 2017-03-16 | Discharge: 2017-03-16 | Disposition: A | Discharge status: Home / Self Care | Payer: BC Managed Care – PPO | Source: Ambulatory Visit | Attending: Radiation Oncology | Admitting: Radiation Oncology

## 2017-03-16 ENCOUNTER — Ambulatory Visit: Payer: BC Managed Care – PPO

## 2017-03-16 DIAGNOSIS — D0511 Intraductal carcinoma in situ of right breast: Secondary | ICD-10-CM | POA: Diagnosis not present

## 2017-03-17 ENCOUNTER — Ambulatory Visit
Admission: RE | Admit: 2017-03-17 | Discharge: 2017-03-17 | Disposition: A | Discharge status: Home / Self Care | Payer: BC Managed Care – PPO | Source: Ambulatory Visit | Attending: Radiation Oncology | Admitting: Radiation Oncology

## 2017-03-17 DIAGNOSIS — D0511 Intraductal carcinoma in situ of right breast: Secondary | ICD-10-CM | POA: Diagnosis not present

## 2017-03-18 ENCOUNTER — Ambulatory Visit
Admission: RE | Admit: 2017-03-18 | Discharge: 2017-03-18 | Disposition: A | Discharge status: Home / Self Care | Payer: BC Managed Care – PPO | Source: Ambulatory Visit | Attending: Radiation Oncology | Admitting: Radiation Oncology

## 2017-03-18 DIAGNOSIS — D0511 Intraductal carcinoma in situ of right breast: Secondary | ICD-10-CM | POA: Diagnosis not present

## 2017-03-19 ENCOUNTER — Ambulatory Visit
Admission: RE | Admit: 2017-03-19 | Discharge: 2017-03-19 | Disposition: A | Discharge status: Home / Self Care | Payer: BC Managed Care – PPO | Source: Ambulatory Visit | Attending: Radiation Oncology | Admitting: Radiation Oncology

## 2017-03-19 DIAGNOSIS — D0511 Intraductal carcinoma in situ of right breast: Secondary | ICD-10-CM | POA: Diagnosis not present

## 2017-03-20 ENCOUNTER — Ambulatory Visit
Admission: RE | Admit: 2017-03-20 | Discharge: 2017-03-20 | Disposition: A | Discharge status: Home / Self Care | Payer: BC Managed Care – PPO | Source: Ambulatory Visit | Attending: Radiation Oncology | Admitting: Radiation Oncology

## 2017-03-20 DIAGNOSIS — D0511 Intraductal carcinoma in situ of right breast: Secondary | ICD-10-CM | POA: Diagnosis not present

## 2017-03-23 ENCOUNTER — Ambulatory Visit
Admission: RE | Admit: 2017-03-23 | Discharge: 2017-03-23 | Disposition: A | Discharge status: Home / Self Care | Payer: BC Managed Care – PPO | Source: Ambulatory Visit | Attending: Radiation Oncology | Admitting: Radiation Oncology

## 2017-03-23 DIAGNOSIS — D0511 Intraductal carcinoma in situ of right breast: Secondary | ICD-10-CM | POA: Diagnosis not present

## 2017-03-24 ENCOUNTER — Ambulatory Visit
Admission: RE | Admit: 2017-03-24 | Discharge: 2017-03-24 | Disposition: A | Discharge status: Home / Self Care | Payer: BC Managed Care – PPO | Source: Ambulatory Visit | Attending: Radiation Oncology | Admitting: Radiation Oncology

## 2017-03-24 DIAGNOSIS — D0511 Intraductal carcinoma in situ of right breast: Secondary | ICD-10-CM | POA: Diagnosis not present

## 2017-03-25 ENCOUNTER — Inpatient Hospital Stay: Payer: BC Managed Care – PPO

## 2017-03-25 ENCOUNTER — Ambulatory Visit
Admission: RE | Admit: 2017-03-25 | Discharge: 2017-03-25 | Disposition: A | Discharge status: Home / Self Care | Payer: BC Managed Care – PPO | Source: Ambulatory Visit | Attending: Radiation Oncology | Admitting: Radiation Oncology

## 2017-03-25 DIAGNOSIS — D0511 Intraductal carcinoma in situ of right breast: Secondary | ICD-10-CM | POA: Diagnosis not present

## 2017-03-30 ENCOUNTER — Ambulatory Visit
Admission: RE | Admit: 2017-03-30 | Discharge: 2017-03-30 | Disposition: A | Discharge status: Home / Self Care | Payer: BC Managed Care – PPO | Source: Ambulatory Visit | Attending: Radiation Oncology | Admitting: Radiation Oncology

## 2017-03-30 DIAGNOSIS — D0511 Intraductal carcinoma in situ of right breast: Secondary | ICD-10-CM | POA: Diagnosis not present

## 2017-03-31 ENCOUNTER — Ambulatory Visit
Admission: RE | Admit: 2017-03-31 | Discharge: 2017-03-31 | Disposition: A | Discharge status: Home / Self Care | Payer: BC Managed Care – PPO | Source: Ambulatory Visit | Attending: Radiation Oncology | Admitting: Radiation Oncology

## 2017-03-31 DIAGNOSIS — D0511 Intraductal carcinoma in situ of right breast: Secondary | ICD-10-CM | POA: Diagnosis not present

## 2017-04-01 ENCOUNTER — Ambulatory Visit
Admission: RE | Admit: 2017-04-01 | Discharge: 2017-04-01 | Disposition: A | Discharge status: Home / Self Care | Payer: BC Managed Care – PPO | Source: Ambulatory Visit | Attending: Radiation Oncology | Admitting: Radiation Oncology

## 2017-04-01 DIAGNOSIS — D0511 Intraductal carcinoma in situ of right breast: Secondary | ICD-10-CM | POA: Diagnosis not present

## 2017-04-02 ENCOUNTER — Ambulatory Visit
Admission: RE | Admit: 2017-04-02 | Discharge: 2017-04-02 | Disposition: A | Discharge status: Home / Self Care | Payer: BC Managed Care – PPO | Source: Ambulatory Visit | Attending: Radiation Oncology | Admitting: Radiation Oncology

## 2017-04-02 DIAGNOSIS — D0511 Intraductal carcinoma in situ of right breast: Secondary | ICD-10-CM | POA: Diagnosis not present

## 2017-04-03 ENCOUNTER — Ambulatory Visit
Admission: RE | Admit: 2017-04-03 | Discharge: 2017-04-03 | Disposition: A | Discharge status: Home / Self Care | Payer: BC Managed Care – PPO | Source: Ambulatory Visit | Attending: Radiation Oncology | Admitting: Radiation Oncology

## 2017-04-03 DIAGNOSIS — D0511 Intraductal carcinoma in situ of right breast: Secondary | ICD-10-CM | POA: Diagnosis not present

## 2017-04-06 ENCOUNTER — Ambulatory Visit
Admission: RE | Admit: 2017-04-06 | Discharge: 2017-04-06 | Disposition: A | Discharge status: Home / Self Care | Payer: BC Managed Care – PPO | Source: Ambulatory Visit | Attending: Radiation Oncology | Admitting: Radiation Oncology

## 2017-04-06 DIAGNOSIS — D0511 Intraductal carcinoma in situ of right breast: Secondary | ICD-10-CM | POA: Diagnosis not present

## 2017-04-07 ENCOUNTER — Ambulatory Visit
Admission: RE | Admit: 2017-04-07 | Discharge: 2017-04-07 | Disposition: A | Discharge status: Home / Self Care | Payer: BC Managed Care – PPO | Source: Ambulatory Visit | Attending: Radiation Oncology | Admitting: Radiation Oncology

## 2017-04-07 ENCOUNTER — Ambulatory Visit: Payer: BC Managed Care – PPO

## 2017-04-07 ENCOUNTER — Other Ambulatory Visit: Payer: Self-pay | Admitting: *Deleted

## 2017-04-07 DIAGNOSIS — D0511 Intraductal carcinoma in situ of right breast: Secondary | ICD-10-CM | POA: Diagnosis not present

## 2017-04-07 MED ORDER — AZITHROMYCIN 250 MG PO TABS: ORAL_TABLET | ORAL | 0 refills | Status: DC

## 2017-04-08 ENCOUNTER — Ambulatory Visit: Payer: BC Managed Care – PPO

## 2017-04-08 ENCOUNTER — Ambulatory Visit
Admission: RE | Admit: 2017-04-08 | Discharge: 2017-04-08 | Disposition: A | Discharge status: Home / Self Care | Payer: BC Managed Care – PPO | Source: Ambulatory Visit | Attending: Radiation Oncology | Admitting: Radiation Oncology

## 2017-04-08 ENCOUNTER — Inpatient Hospital Stay: Payer: BC Managed Care – PPO | Attending: Oncology

## 2017-04-08 DIAGNOSIS — I10 Essential (primary) hypertension: Secondary | ICD-10-CM | POA: Diagnosis not present

## 2017-04-08 DIAGNOSIS — Z923 Personal history of irradiation: Secondary | ICD-10-CM | POA: Diagnosis not present

## 2017-04-08 DIAGNOSIS — D0511 Intraductal carcinoma in situ of right breast: Secondary | ICD-10-CM | POA: Diagnosis not present

## 2017-04-08 DIAGNOSIS — Z170 Estrogen receptor positive status [ER+]: Secondary | ICD-10-CM | POA: Diagnosis not present

## 2017-04-08 LAB — CBC
HCT: 35.5 % (ref 35.0–47.0)
Hemoglobin: 11.9 g/dL — ABNORMAL LOW (ref 12.0–16.0)
MCH: 31.5 pg (ref 26.0–34.0)
MCHC: 33.4 g/dL (ref 32.0–36.0)
MCV: 94.2 fL (ref 80.0–100.0)
PLATELETS: 219 10*3/uL (ref 150–440)
RBC: 3.77 MIL/uL — AB (ref 3.80–5.20)
RDW: 13.6 % (ref 11.5–14.5)
WBC: 5.7 10*3/uL (ref 3.6–11.0)

## 2017-04-09 ENCOUNTER — Ambulatory Visit: Payer: BC Managed Care – PPO

## 2017-04-09 ENCOUNTER — Ambulatory Visit
Admission: RE | Admit: 2017-04-09 | Discharge: 2017-04-09 | Disposition: A | Discharge status: Home / Self Care | Payer: BC Managed Care – PPO | Source: Ambulatory Visit | Attending: Radiation Oncology | Admitting: Radiation Oncology

## 2017-04-09 DIAGNOSIS — D0511 Intraductal carcinoma in situ of right breast: Secondary | ICD-10-CM | POA: Diagnosis not present

## 2017-04-10 ENCOUNTER — Ambulatory Visit: Payer: BC Managed Care – PPO

## 2017-04-10 ENCOUNTER — Ambulatory Visit
Admission: RE | Admit: 2017-04-10 | Discharge: 2017-04-10 | Disposition: A | Discharge status: Home / Self Care | Payer: BC Managed Care – PPO | Source: Ambulatory Visit | Attending: Radiation Oncology | Admitting: Radiation Oncology

## 2017-04-10 DIAGNOSIS — D0511 Intraductal carcinoma in situ of right breast: Secondary | ICD-10-CM | POA: Diagnosis not present

## 2017-04-13 ENCOUNTER — Ambulatory Visit: Payer: BC Managed Care – PPO

## 2017-04-14 ENCOUNTER — Inpatient Hospital Stay: Payer: BC Managed Care – PPO

## 2017-04-14 ENCOUNTER — Ambulatory Visit: Payer: BC Managed Care – PPO

## 2017-04-14 ENCOUNTER — Telehealth: Payer: Self-pay | Admitting: *Deleted

## 2017-04-14 ENCOUNTER — Ambulatory Visit
Admission: RE | Admit: 2017-04-14 | Discharge: 2017-04-14 | Disposition: A | Discharge status: Home / Self Care | Payer: BC Managed Care – PPO | Source: Ambulatory Visit | Attending: Radiation Oncology | Admitting: Radiation Oncology

## 2017-04-14 ENCOUNTER — Encounter: Payer: Self-pay | Admitting: Oncology

## 2017-04-14 ENCOUNTER — Telehealth: Payer: Self-pay | Admitting: Family Medicine

## 2017-04-14 ENCOUNTER — Inpatient Hospital Stay (HOSPITAL_BASED_OUTPATIENT_CLINIC_OR_DEPARTMENT_OTHER): Payer: BC Managed Care – PPO | Admitting: Oncology

## 2017-04-14 ENCOUNTER — Encounter (INDEPENDENT_AMBULATORY_CARE_PROVIDER_SITE_OTHER): Payer: Self-pay

## 2017-04-14 VITALS — BP 170/91 | HR 63 | Temp 97.4°F | Resp 16 | Wt 194.8 lb

## 2017-04-14 DIAGNOSIS — Z923 Personal history of irradiation: Secondary | ICD-10-CM | POA: Diagnosis not present

## 2017-04-14 DIAGNOSIS — D0511 Intraductal carcinoma in situ of right breast: Secondary | ICD-10-CM | POA: Diagnosis not present

## 2017-04-14 DIAGNOSIS — Z170 Estrogen receptor positive status [ER+]: Secondary | ICD-10-CM | POA: Diagnosis not present

## 2017-04-14 LAB — COMPREHENSIVE METABOLIC PANEL
ALBUMIN: 3.5 g/dL (ref 3.5–5.0)
ALK PHOS: 84 U/L (ref 38–126)
ALT: 19 U/L (ref 14–54)
AST: 18 U/L (ref 15–41)
Anion gap: 8 (ref 5–15)
BUN: 13 mg/dL (ref 6–20)
CHLORIDE: 103 mmol/L (ref 101–111)
CO2: 27 mmol/L (ref 22–32)
CREATININE: 0.89 mg/dL (ref 0.44–1.00)
Calcium: 8.9 mg/dL (ref 8.9–10.3)
GFR calc non Af Amer: >60 mL/min (ref >60)
GLUCOSE: 79 mg/dL (ref 65–99)
Potassium: 3.7 mmol/L (ref 3.5–5.1)
SODIUM: 138 mmol/L (ref 135–145)
Total Bilirubin: 0.3 mg/dL (ref 0.3–1.2)
Total Protein: 7 g/dL (ref 6.5–8.1)

## 2017-04-14 LAB — CBC
HCT: 35.5 % (ref 35.0–47.0)
HEMOGLOBIN: 12 g/dL (ref 12.0–16.0)
MCH: 31.6 pg (ref 26.0–34.0)
MCHC: 33.8 g/dL (ref 32.0–36.0)
MCV: 93.6 fL (ref 80.0–100.0)
PLATELETS: 217 10*3/uL (ref 150–440)
RBC: 3.8 MIL/uL (ref 3.80–5.20)
RDW: 14.2 % (ref 11.5–14.5)
WBC: 5 10*3/uL (ref 3.6–11.0)

## 2017-04-14 MED ORDER — AMLODIPINE BESYLATE 5 MG PO TABS: 5.0000 mg | ORAL_TABLET | Freq: Every day | ORAL | 0 refills | Status: DC

## 2017-04-14 MED ORDER — ANASTROZOLE 1 MG PO TABS: 1.0000 mg | ORAL_TABLET | Freq: Every day | ORAL | 3 refills | Status: DC

## 2017-04-14 NOTE — Telephone Encounter (Signed)
Left voice mail message for patient: appointment made with Dr. Rosana Hoes at [facility] for December 17th at 9:45, re: painful lump in breast.      dhs

## 2017-04-14 NOTE — Progress Notes (Signed)
Hematology/Oncology Consult note [facility]  Telephone:([phone] [phone]  Patient Care Team: Arnetha Courser, MD as PCP - General (Family Medicine)   Name of the patient: Marissa Nelson  [id number]  08-May-1950   Date of visit: 04/14/17  Diagnosis- right breast DCIS ER + s/p lumpectomy  Chief complaint/ Reason for visit-routine follow-up of DCIS  Heme/Onc history: 1. Patient is a 66 year old African-American female with no significant comorbidities other than hypertension. She underwent screening bilateral mammogram in August 2017 which showed calcifications in her right breast. This was followed by a diagnostic mammogram which showed 2 sets of calcifications: 1 was 4 mm in the upper outer right breast and an additional 7 mm calcification in the central subareolar right breast. The 4 mm group of calcifications was biopsies and wasnegative for malignancy. Repeat mammogram was recommended in 6 months. This mammogram showed a new 15 mm calcifications in the upper outer quadrant of the right breast measuring 12 x 15 x 13 mm. Stable group of benign-appearing calcifications in the right breast upper outer quadrant measuring 5 mm.  2. Patient underwent 2 sets of biopsies. Biopsies from the right upper outer quadrant showed intermediate grade DCIS with comedonecrosis and associated calcifications. ER greater than 90% positive and PR was 11-50% positive. Biopsy of the right lower quadrant of the right breast showed fibroadenomatous changes with calcifications.  3. Patient has been seen by Dr. Layla Maw is scheduled to undergo lumpectomy on 01/30/2017  4. Patient had menarche at the age of 3. 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 History of breast cancer in one of her sisters. She is G2 P2 L2. She did not breast-feed.  5. Patient  underwent lumpectomy on 01/30/2017 which showed a 15 mm DCIS, grade 2 with comedonecrosis. Margins were -3 mm from the closest medial margin lymph nodes were not submitted. Pathology stage pTisNx. ER greater than 90% positive PR 11-50% positive     Interval history- patient reports pain and soreness at the site of lumpectomy in her right breast.  She will be finishing her radiation this week.  Denies any fever.  ECOG PS- 0 Pain scale- 0   Review of systems- Review of Systems  Constitutional: Negative for chills, fever, malaise/fatigue and weight loss.  HENT: Negative for congestion, ear discharge and nosebleeds.   Eyes: Negative for blurred vision.  Respiratory: Negative for cough, hemoptysis, sputum production, shortness of breath and wheezing.   Cardiovascular: Negative for chest pain, palpitations, orthopnea and claudication.  Gastrointestinal: Negative for abdominal pain, blood in stool, constipation, diarrhea, heartburn, melena, nausea and vomiting.  Genitourinary: Negative for dysuria, flank pain, frequency, hematuria and urgency.  Musculoskeletal: Negative for back pain, joint pain and myalgias.  Skin: Negative for rash.  Neurological: Negative for dizziness, tingling, focal weakness, seizures, weakness and headaches.  Endo/Heme/Allergies: Does not bruise/bleed easily.  Psychiatric/Behavioral: Negative for depression and suicidal ideas. The patient does not have insomnia.   Positive for right breast pain and tenderness   Allergies  Allergen Reactions  . Shellfish Allergy Swelling    angioedema  . Penicillin G Rash    Has patient had a PCN reaction causing immediate rash, facial/tongue/throat swelling, SOB or lightheadedness with hypotension: Yes Has patient had a PCN reaction causing severe rash involving mucus membranes or skin necrosis: Yes Has patient had a PCN reaction that required hospitalization: No Has patient  had a PCN reaction occurring within the last 10 years:  No If all of the above answers are "NO", then may proceed with Cephalosporin use.      Past Medical History:  Diagnosis Date  . Abnormal mammogram of right breast 01/17/2016   Recommendation: Six month follow-up diagnostic mammogram of the right breast for an additional group of probably benign Calcifications. Sept 2017 --> due March 2018  . Arthralgia 10/04/2016  . Arthritis    hands  . Benign essential HTN 12/07/2006  . Breast cancer ([place])   . Breast cancer screening 11/15/2015  . Chronic kidney disease, stage II (mild) 11/15/2015  . Chronic left shoulder pain 11/08/2014  . 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 Family history of diabetes mellitus 06/12/2016   Mother and several other relatives  . HLD (hyperlipidemia) 06/15/2015  . Hx of right breast biopsy 01/17/2016  . Hyperlipidemia   . Hypertension   . Medication monitoring encounter 11/15/2015  . Motion sickness    ocean ships  . Numbness and tingling in left hand 11/07/2014  . Obesity 10/25/2015  . Obesity (BMI 30.0-34.9) 10/25/2015  . Positive ANA (antinuclear antibody) 10/04/2016  . Prediabetes   . Preventative health care 11/15/2015  . Vitamin D deficiency 10/29/2015     Past Surgical History:  Procedure Laterality Date  . ABDOMINAL HYSTERECTOMY    . BREAST BIOPSY Right 01/15/2016   neg- core  . BREAST BIOPSY Right 12/30/2016   stereo bx for calcifications: DCIS  . BREAST BIOPSY Right 01/08/2017   path pending  . BREAST LUMPECTOMY WITH NEEDLE LOCALIZATION Right 01/30/2017   Procedure: BREAST LUMPECTOMY WITH NEEDLE LOCALIZATION;  Surgeon: Vickie Epley, MD;  Location: [facility];  Service: General;  Laterality: Right;  . COLONOSCOPY WITH PROPOFOL N/A 11/19/2016   Procedure: COLONOSCOPY WITH PROPOFOL;  Surgeon: Jonathon Bellows, MD;  Location: [place];  Service: Endoscopy;  Laterality: N/A;  . ESOPHAGOGASTRODUODENOSCOPY (EGD) WITH PROPOFOL N/A 11/19/2016   Procedure:  ESOPHAGOGASTRODUODENOSCOPY (EGD) WITH PROPOFOL;  Surgeon: Jonathon Bellows, MD;  Location: [place];  Service: Endoscopy;  Laterality: N/A;  . GIVENS CAPSULE STUDY N/A 12/12/2016   Procedure: GIVENS CAPSULE STUDY;  Surgeon: Jonathon Bellows, MD;  Location: [facility];  Service: Gastroenterology;  Laterality: N/A;  . TOTAL MASTECTOMY Right 01/30/2017   Procedure: TOTAL MASTECTOMY;  Surgeon: Vickie Epley, MD;  Location: [facility];  Service: General;  Laterality: Right;    Social History   Socioeconomic History  . Marital status: Married    Spouse name: Not on file  . Number of children: 2  . Years of education: Not on file  . Highest education level: Not on file  Social Needs  . Financial resource strain: Not on file  . Food insecurity - worry: Not on file  . Food insecurity - inability: Not on file  . Transportation needs - medical: Not on file  . Transportation needs - non-medical: Not on file  Occupational History  . Occupation: Works in [employer] [place], [place])    Employer: [employer]  Tobacco Use  . Smoking status: Never Smoker  . Smokeless tobacco: Never Used  Substance and Sexual Activity  . Alcohol use: No    Alcohol/week: 0.0 oz  . Drug use: No  . Sexual activity: Not Currently  Other Topics Concern  . Not on file  Social History Narrative  . Not on file    Family History  Problem Relation Age of Onset  . Cancer Mother 70       liver cancer  . Diabetes Mother   . Hypertension Mother   . Glomerulonephritis Father   . Diabetes Sister   . Glomerulonephritis Sister   . Cancer Sister 40       throat cancer  . Heart disease Sister   . Diabetes Sister   . Diabetes Sister   . COPD Sister   . Diabetes Sister   . Breast cancer Cousin        Currently undergoing systemic chemotherapy (01/2017)  . Varicose Veins Neg Hx      Current Outpatient Medications:  .  acetaminophen (TYLENOL) 325 MG tablet, Take 650 mg by mouth every 6  (six) hours as needed for mild pain or headache. , Disp: , Rfl:  .  aspirin EC 81 MG tablet, Take 1 tablet (81 mg total) by mouth daily., Disp: , Rfl:  .  azithromycin (ZITHROMAX Z-PAK) 250 MG tablet, 1 Z-Pack; follow package directions., Disp: 6 each, Rfl: 0 .  losartan-hydrochlorothiazide (HYZAAR) 100-25 MG tablet, Take 1 tablet by mouth daily., Disp: 90 tablet, Rfl: 3 .  Vitamin D, Ergocalciferol, 2000 units CAPS, Take 2,000 Units by mouth daily. , Disp: , Rfl:   Physical exam:  Vitals:   04/14/17 1425 04/14/17 1430  BP: (!) 175/93 (!) 170/91  Pulse: 63   Resp: 16   Temp: (!) 97.4 F (36.3 C)   TempSrc: Tympanic   Weight: 194 lb 12.8 oz (88.4 kg)    Physical Exam  Constitutional: She is oriented to person, place, and time and well-developed, well-nourished, and in no distress.  HENT:  Head: Normocephalic and atraumatic.  Eyes: EOM are normal. Pupils are equal, round, and reactive to light.  Neck: Normal range of motion.  Cardiovascular: Normal rate, regular rhythm and normal heart sounds.  Pulmonary/Chest: Effort normal and breath sounds normal.  There is a fluid-filled collection noted in the right breast at the site of lumpectomy.  Skin overlying it does not appear infected.  No local warmth.  Abdominal: Soft. Bowel sounds are normal.  Neurological: She is alert and oriented to person, place, and time.  Skin: Skin is warm and dry.     CMP Latest Ref Rng & Units 01/27/2017  Glucose 65 - 99 mg/dL 79  BUN 6 - 20 mg/dL 18  Creatinine 0.44 - 1.00 mg/dL 1.01(H)  Sodium 135 - 145 mmol/L 138  Potassium 3.5 - 5.1 mmol/L 3.8  Chloride 101 - 111 mmol/L 103  CO2 22 - 32 mmol/L 26  Calcium 8.9 - 10.3 mg/dL 9.2  Total Protein 6.5 - 8.1 g/dL -  Total Bilirubin 0.3 - 1.2 mg/dL -  Alkaline Phos 38 - 126 U/L -  AST 15 - 41 U/L -  ALT 14 - 54 U/L -   CBC Latest Ref Rng & Units 04/08/2017  WBC 3.6 - 11.0 K/uL 5.7  Hemoglobin 12.0 - 16.0 g/dL 11.9(L)  Hematocrit 35.0 - 47.0 % 35.5    Platelets 150 - 440 K/uL 219      Assessment and plan- Patient is a 66 y.o. female with right breast DCIS status post lumpectomy  Patient likely has a seroma at the site of the right lumpectomy site.  Clinically does not appear infected.  However I will touch base with Dr. Rosana Hoes and see if it needs to be drained.  With regards to her right breast DCIS she will need to start hormone therapy  upon completion of radiation.  She would like to wait until after holidays.  I will proceed with Arimidex 1 mg p.o. daily which will be sent to her pharmacy which she will start taking it on January 1.  I will see her mid February with a CMP to see how she is tolerating her Arimidex   Visit Diagnosis 1. Ductal carcinoma in situ (DCIS) of right breast   2. Ductal carcinoma in situ of right breast      Dr. Randa Evens, MD, MPH [facility] at [facility] Pager- [phone] 04/14/2017 2:07 PM

## 2017-04-14 NOTE — Telephone Encounter (Signed)
I reviewed outside BP readings from oncologist office Please contact patient Find out if she is taking her BP medicine Find out if she can check her BP at home, and if yes, what has her BP been running? We need to start another agent with pressures this high I'll send in another medicine; appt with me in one week

## 2017-04-14 NOTE — Telephone Encounter (Signed)
Called pt no answer. LM for pt informing her o

## 2017-04-14 NOTE — Telephone Encounter (Signed)
LM for pt informing her of the information below. Advised pt to call back with requested BPs. CRM created.

## 2017-04-14 NOTE — Progress Notes (Signed)
Patient here for follow up with labs today. She states that she is feeling well ands denies having any pain. She recently finished a Z-pack for an URI. She states that she feels a lump in her right breast that she noticed about two weeks ago. Her BP is elevated today and patient states that it has been running high for about a week.

## 2017-04-15 ENCOUNTER — Ambulatory Visit: Payer: BC Managed Care – PPO

## 2017-04-16 ENCOUNTER — Ambulatory Visit
Admission: RE | Admit: 2017-04-16 | Discharge: 2017-04-16 | Disposition: A | Discharge status: Home / Self Care | Payer: BC Managed Care – PPO | Source: Ambulatory Visit | Attending: Radiation Oncology | Admitting: Radiation Oncology

## 2017-04-16 ENCOUNTER — Ambulatory Visit: Payer: BC Managed Care – PPO

## 2017-04-16 DIAGNOSIS — D0511 Intraductal carcinoma in situ of right breast: Secondary | ICD-10-CM | POA: Diagnosis not present

## 2017-04-17 ENCOUNTER — Ambulatory Visit
Admission: RE | Admit: 2017-04-17 | Discharge: 2017-04-17 | Disposition: A | Discharge status: Home / Self Care | Payer: BC Managed Care – PPO | Source: Ambulatory Visit | Attending: Radiation Oncology | Admitting: Radiation Oncology

## 2017-04-17 ENCOUNTER — Ambulatory Visit: Payer: BC Managed Care – PPO

## 2017-04-17 DIAGNOSIS — D0511 Intraductal carcinoma in situ of right breast: Secondary | ICD-10-CM | POA: Diagnosis not present

## 2017-04-20 ENCOUNTER — Ambulatory Visit (INDEPENDENT_AMBULATORY_CARE_PROVIDER_SITE_OTHER): Payer: BC Managed Care – PPO | Admitting: Surgery

## 2017-04-20 ENCOUNTER — Encounter: Payer: Self-pay | Admitting: Surgery

## 2017-04-20 VITALS — BP 149/87 | HR 82 | Temp 98.3°F | Wt 194.0 lb

## 2017-04-20 DIAGNOSIS — D0511 Intraductal carcinoma in situ of right breast: Secondary | ICD-10-CM

## 2017-04-20 NOTE — Patient Instructions (Signed)
Please give us a call in case you have any questions or concerns.  

## 2017-04-20 NOTE — Progress Notes (Signed)
Surgical Clinic Progress/Follow-up Note   HPI:  66 y.o. Female presents to clinic for follow-up evaluation of post-lumpectomy Right breast "mass". Patient reports she first noticed the mass 2 weeks ago after completing radiation therapy for Right breast upper outer quadrant DCIS s/p lumpectomy. She states she did not experience much pain following surgery and accordingly did not even fill her prescription for narcotic pain medication. She currently describes occasional "stinging" pain, but expresses more concern regarding the presence of the "mass" and its significance for her malignancy. She otherwise denies fever/chils, CP, or SOB and says she's been feeling well.  Review of Systems:  Constitutional: denies any other weight loss, fever, chills, or sweats  Eyes: denies any other vision changes, history of eye injury  ENT: denies sore throat, hearing problems  Respiratory: denies shortness of breath, wheezing  Cardiovascular: denies chest pain, palpitations  Gastrointestinal: denies abdominal pain, N/V, or diarrhea Musculoskeletal: denies any other joint pains or cramps  Skin: Denies any other rashes or skin discolorations except post-surgical wound as per interval history Neurological: denies any other headache, dizziness, weakness  Psychiatric: denies any other depression, anxiety  All other review of systems: otherwise negative   Vital Signs:  BP (!) 149/87   Pulse 82   Temp 98.3 F (36.8 C) (Oral)   Wt 194 lb (88 kg)   BMI 35.48 kg/m    Physical Exam:  Constitutional:  -- Overweight body habitus  -- Awake, alert, and oriented x3  Eyes:  -- Pupils equally round and reactive to light  -- No scleral icterus  Ear, nose, throat:  -- No jugular venous distension  -- No nasal drainage, bleeding Pulmonary:  -- No crackles -- Equal breath sounds bilaterally -- Breathing non-labored at rest Cardiovascular:  -- S1, S2 present  -- No pericardial rubs  Breast: -- Right breast  Right-sided upper > lower hyperpigmentation with well-healed post-surgical incision without surrounding erythema or drainage and non-tender firm underlying area Gastrointestinal:  -- Soft, nontender, non-distended, no guarding/rebound  -- No abdominal masses appreciated, pulsatile or otherwise  Musculoskeletal / Integumentary:  -- Wounds or skin discoloration: None appreciated except as described in detail above (breast)  -- Extremities: B/L UE and LE FROM, hands and feet warm,no edema  Neurologic:  -- Motor function: intact and symmetric  -- Sensation: intact and symmetric   Assessment:  66 y.o. yo Female with a problem list including...  Patient Active Problem List   Diagnosis Date Noted  . Ductal carcinoma in situ of right breast   . 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 Positive ANA (antinuclear antibody) 10/04/2016  . Arthralgia 10/04/2016  . Prediabetes 06/20/2016  . Family history of diabetes mellitus 06/12/2016  . Hx of right breast biopsy 01/17/2016  . Abnormal mammogram of right breast 01/17/2016  . Breast cancer screening 11/15/2015  . Medication monitoring encounter 11/15/2015  . Preventative health care 11/15/2015  . Chronic kidney disease, stage II (mild) 11/15/2015  . Vitamin D deficiency 10/29/2015  . Obesity 10/25/2015  . HLD (hyperlipidemia) 06/15/2015  . Obstructive apnea 06/15/2015  . Chronic left shoulder pain 11/08/2014  . Numbness and tingling in left hand 11/07/2014  . Benign essential HTN 12/07/2006    presents to clinic, doing well with minimally symptomatic, seeming to become further decreasingly symptomatic, post-surgical Right breast seroma and Right breast post-radiation hyperpigmentation and scar 2 months s/p Right breast lumpectomy for DCIS with calcifications and negative surgical margins.  Plan:   - agree with  Arimidex as per medical oncology  - no indication for drainage of non-infected minimally symptomatic Right breast  seroma  - return to clinic as needed, instructed to call office if any questions or concerns  - follow-up mammogram ~6 months as scheduled  All of the above recommendations were discussed with the patient, and all of patient's questions were answered to her expressed satisfaction.  -- Marilynne Drivers Rosana Hoes, MD, [place]: [facility]ing for exceptional care. Office: [phone]

## 2017-05-07 ENCOUNTER — Ambulatory Visit (INDEPENDENT_AMBULATORY_CARE_PROVIDER_SITE_OTHER): Payer: BC Managed Care – PPO | Admitting: Family Medicine

## 2017-05-07 ENCOUNTER — Encounter: Payer: Self-pay | Admitting: Family Medicine

## 2017-05-07 DIAGNOSIS — E538 Deficiency of other specified B group vitamins: Secondary | ICD-10-CM | POA: Diagnosis not present

## 2017-05-07 DIAGNOSIS — Z6835 Body mass index (BMI) 35.0-35.9, adult: Secondary | ICD-10-CM | POA: Diagnosis not present

## 2017-05-07 DIAGNOSIS — I10 Essential (primary) hypertension: Secondary | ICD-10-CM

## 2017-05-07 DIAGNOSIS — Z23 Encounter for immunization: Secondary | ICD-10-CM | POA: Diagnosis not present

## 2017-05-07 DIAGNOSIS — R7303 Prediabetes: Secondary | ICD-10-CM | POA: Diagnosis not present

## 2017-05-07 DIAGNOSIS — E782 Mixed hyperlipidemia: Secondary | ICD-10-CM | POA: Diagnosis not present

## 2017-05-07 DIAGNOSIS — D0511 Intraductal carcinoma in situ of right breast: Secondary | ICD-10-CM | POA: Diagnosis not present

## 2017-05-07 MED ORDER — AMLODIPINE BESYLATE 10 MG PO TABS: 10.0000 mg | ORAL_TABLET | Freq: Every day | ORAL | 3 refills | Status: DC

## 2017-05-07 NOTE — Assessment & Plan Note (Signed)
Patient did not want to have the PPSV-23 vaccine today, but will return in one month for this with CMA

## 2017-05-07 NOTE — Patient Instructions (Addendum)
Increase your amlodipine from 5 mg daily to 10 mg daily Try to follow the DASH guidelines (DASH stands for Dietary Approaches to Stop Hypertension). Try to limit the sodium in your diet to no more than 1,500mg  of sodium per day. Certainly try to not exceed 2,000 mg per day at the very most. Do not add salt when cooking or at the table.  Check the sodium amount on labels when shopping, and choose items lower in sodium when given a choice. Avoid or limit foods that already contain a lot of sodium. Eat a diet rich in fruits and vegetables and whole grains, and try to lose weight if overweight or obese  Monitor your blood pressure and let me know if not consistently under 140 at least and under 130 would be even better We can add a fourth pill if needed Try to lose five pounds over the next month to help your blood pressure Try to lose twenty pounds over the next six to nine months  Add sublingual vitamin B12 to your daily regimen and that should help melt the pounds off and give you energy Check out the information at familydoctor.org entitled "Nutrition for Weight Loss: What You Need to Know about Fad Diets" Try to lose between 1-2 pounds per week by taking in fewer calories and burning off more calories You can succeed by limiting portions, limiting foods dense in calories and fat, becoming more active, and drinking 8 glasses of water a day (64 ounces) Don't skip meals, especially breakfast, as skipping meals may alter your metabolism Do not use over-the-counter weight loss pills or gimmicks that claim rapid weight loss A healthy BMI (or body mass index) is between 18.5 and 24.9 You can calculate your ideal BMI at the [place] website ClubMonetize.fr   DASH Eating Plan DASH stands for "Dietary Approaches to Stop Hypertension." The DASH eating plan is a healthy eating plan that has been shown to reduce high blood pressure (hypertension). It may also  reduce your risk for type 2 diabetes, heart disease, and stroke. The DASH eating plan may also help with weight loss. What are tips for following this plan? General guidelines  Avoid eating more than 2,300 mg (milligrams) of salt (sodium) a day. If you have hypertension, you may need to reduce your sodium intake to 1,500 mg a day.  Limit alcohol intake to no more than 1 drink a day for nonpregnant women and 2 drinks a day for men. One drink equals 12 oz of beer, 5 oz of wine, or 1 oz of hard liquor.  Work with your health care provider to maintain a healthy body weight or to lose weight. Ask what an ideal weight is for you.  Get at least 30 minutes of exercise that causes your heart to beat faster (aerobic exercise) most days of the week. Activities may include walking, swimming, or biking.  Work with your health care provider or diet and nutrition specialist (dietitian) to adjust your eating plan to your individual calorie needs. Reading food labels  Check food labels for the amount of sodium per serving. Choose foods with less than 5 percent of the Daily Value of sodium. Generally, foods with less than 300 mg of sodium per serving fit into this eating plan.  To find whole grains, look for the word "whole" as the first word in the ingredient list. Shopping  Buy products labeled as "low-sodium" or "no salt added."  Buy fresh foods. Avoid canned foods and premade or frozen  meals. Cooking  Avoid adding salt when cooking. Use salt-free seasonings or herbs instead of table salt or sea salt. Check with your health care provider or pharmacist before using salt substitutes.  Do not fry foods. Cook foods using healthy methods such as baking, boiling, grilling, and broiling instead.  Cook with heart-healthy oils, such as olive, canola, soybean, or sunflower oil. Meal planning   Eat a balanced diet that includes: ? 5 or more servings of fruits and vegetables each day. At each meal, try to  fill half of your plate with fruits and vegetables. ? Up to 6-8 servings of whole grains each day. ? Less than 6 oz of lean meat, poultry, or fish each day. A 3-oz serving of meat is about the same size as a deck of cards. One egg equals 1 oz. ? 2 servings of low-fat dairy each day. ? A serving of nuts, seeds, or beans 5 times each week. ? Heart-healthy fats. Healthy fats called Omega-3 fatty acids are found in foods such as flaxseeds and coldwater fish, like sardines, salmon, and mackerel.  Limit how much you eat of the following: ? Canned or prepackaged foods. ? Food that is high in trans fat, such as fried foods. ? Food that is high in saturated fat, such as fatty meat. ? Sweets, desserts, sugary drinks, and other foods with added sugar. ? Full-fat dairy products.  Do not salt foods before eating.  Try to eat at least 2 vegetarian meals each week.  Eat more home-cooked food and less restaurant, buffet, and fast food.  When eating at a restaurant, ask that your food be prepared with less salt or no salt, if possible. What foods are recommended? The items listed may not be a complete list. Talk with your dietitian about what dietary choices are best for you. Grains Whole-grain or whole-wheat bread. Whole-grain or whole-wheat pasta. Brown rice. [place]. Bulgur. Whole-grain and low-sodium cereals. Pita bread. Low-fat, low-sodium crackers. Whole-wheat flour tortillas. Vegetables Fresh or frozen vegetables (raw, steamed, roasted, or grilled). Low-sodium or reduced-sodium tomato and vegetable juice. Low-sodium or reduced-sodium tomato sauce and tomato paste. Low-sodium or reduced-sodium canned vegetables. Fruits All fresh, dried, or frozen fruit. Canned fruit in natural juice (without added sugar). Meat and other protein foods Skinless chicken or [place]. Ground chicken or [place]. Pork with fat trimmed off. Fish and seafood. Egg whites. Dried beans, peas, or lentils. Unsalted nuts,  nut butters, and seeds. Unsalted canned beans. Lean cuts of beef with fat trimmed off. Low-sodium, lean deli meat. Dairy Low-fat (1%) or fat-free (skim) milk. Fat-free, low-fat, or reduced-fat cheeses. Nonfat, low-sodium ricotta or cottage cheese. Low-fat or nonfat yogurt. Low-fat, low-sodium cheese. Fats and oils Soft margarine without trans fats. Vegetable oil. Low-fat, reduced-fat, or light mayonnaise and salad dressings (reduced-sodium). Canola, safflower, olive, soybean, and sunflower oils. Avocado. Seasoning and other foods Herbs. Spices. Seasoning mixes without salt. Unsalted popcorn and pretzels. Fat-free sweets. What foods are not recommended? The items listed may not be a complete list. Talk with your dietitian about what dietary choices are best for you. Grains Baked goods made with fat, such as croissants, muffins, or some breads. Dry pasta or rice meal packs. Vegetables Creamed or fried vegetables. Vegetables in a cheese sauce. Regular canned vegetables (not low-sodium or reduced-sodium). Regular canned tomato sauce and paste (not low-sodium or reduced-sodium). Regular tomato and vegetable juice (not low-sodium or reduced-sodium). Angie Fava. Olives. Fruits Canned fruit in a light or heavy syrup. Fried fruit. Fruit in cream  or butter sauce. Meat and other protein foods Fatty cuts of meat. Ribs. Fried meat. Berniece Salines. Sausage. Bologna and other processed lunch meats. Salami. Fatback. Hotdogs. Bratwurst. Salted nuts and seeds. Canned beans with added salt. Canned or smoked fish. Whole eggs or egg yolks. Chicken or [place] with skin. Dairy Whole or 2% milk, cream, and half-and-half. Whole or full-fat cream cheese. Whole-fat or sweetened yogurt. Full-fat cheese. Nondairy creamers. Whipped toppings. Processed cheese and cheese spreads. Fats and oils Butter. Stick margarine. Lard. Shortening. Ghee. Bacon fat. Tropical oils, such as coconut, palm kernel, or palm oil. Seasoning and other  foods Salted popcorn and pretzels. Onion salt, garlic salt, seasoned salt, table salt, and sea salt. Worcestershire sauce. Tartar sauce. Barbecue sauce. Teriyaki sauce. Soy sauce, including reduced-sodium. Steak sauce. Canned and packaged gravies. Fish sauce. Oyster sauce. Cocktail sauce. Horseradish that you find on the shelf. Ketchup. Mustard. Meat flavorings and tenderizers. Bouillon cubes. Hot sauce and Tabasco sauce. Premade or packaged marinades. Premade or packaged taco seasonings. Relishes. Regular salad dressings. Where to find more information:  National Heart, Lung, and National: https://wilson-eaton.com/  American Heart Association: www.heart.org Summary  The DASH eating plan is a healthy eating plan that has been shown to reduce high blood pressure (hypertension). It may also reduce your risk for type 2 diabetes, heart disease, and stroke.  With the DASH eating plan, you should limit salt (sodium) intake to 2,300 mg a day. If you have hypertension, you may need to reduce your sodium intake to 1,500 mg a day.  When on the DASH eating plan, aim to eat more fresh fruits and vegetables, whole grains, lean proteins, low-fat dairy, and heart-healthy fats.  Work with your health care provider or diet and nutrition specialist (dietitian) to adjust your eating plan to your individual calorie needs. This information is not intended to replace advice given to you by your health care provider. Make sure you discuss any questions you have with your health care provider. Document Released: 04/10/2011 Document Revised: 04/14/2016 Document Reviewed: 04/14/2016 Elsevier Interactive Patient Education  [employer].

## 2017-05-07 NOTE — Assessment & Plan Note (Signed)
Encouraged patient to start SL vitamin B12, which should help with energy and may assist with weight loss

## 2017-05-07 NOTE — Assessment & Plan Note (Signed)
Encouraged patient to lose five pounds over the next month; encouraged her to set an attainable goal and she wants to get down to 175 pounds; she said over the next year, not six months, but I encouraged her to try to do this sooner than a whole year; decreasing 500 kcal a day net will result in 1 pound of fat lost per week, and 250 kcal net decrease will help her lose 0.5 pound of fat per week

## 2017-05-07 NOTE — Assessment & Plan Note (Addendum)
Continue Arimidex; f/u with oncologist and surgeon

## 2017-05-07 NOTE — Progress Notes (Signed)
BP (!) 144/90 (BP Location: Left Arm, Patient Position: Sitting, Cuff Size: Large)   Pulse 86   Temp 97.8 F (36.6 C) (Oral)   Ht 5\' 1"  (1.549 m)   Wt 195 lb 8 oz (88.7 kg)   SpO2 98%   BMI 36.94 kg/m    Subjective:    Patient ID: Marissa Nelson, female    DOB: [birth year], 67 y.o.   MRN: [id number]  HPI: Marissa Nelson is a 67 y.o. female  Chief Complaint  Patient presents with  . Follow-up    Pt staets that she has been taking "extra BP pill" readings have been high   . Immunizations    discuss PNA w/ immune system     HPI Patient is here for f/u Her blood pressure readings have been high lately so she has been taking an extra blood pressure pills She has been checking and it's been "high"; she was thinking it was related to the radiation treatments; it is starting to level out; not using much salt; loves hot dogs, they are her favorite, but trying to cut back; makes chicken salad but it made her sick b/c of the mayonnaise; trying different kinds of milk; couldn't even drink almond milk; her body is changing  She has breast cancer (DCIS, right breast) and is followed by oncologist, Dr. Janese Banks and radiation oncologist, Dr. Eulas Post; she completed radiation therapy on Dec 14th She saw her surgeon, Dr. Tama High on December 17th; note reviewed; BP there was 149/87 She will continue Arimidex per medical oncology  Vitamin B12 was 240 in July; she was not aware of that it sounds like when we reviewed her previous labs together  Depression screen [facility] 2/9 05/07/2017 11/04/2016 10/02/2016 06/12/2016 05/06/2016  Decreased Interest 0 0 0 0 0  Down, Depressed, Hopeless 0 0 0 0 0  PHQ - 2 Score 0 0 0 0 0    Relevant past medical, surgical, family and social history reviewed Past Medical History:  Diagnosis Date  . Abnormal mammogram of right breast 01/17/2016   Recommendation: Six month follow-up diagnostic mammogram of the right breast for an additional group of probably benign  Calcifications. Sept 2017 --> due March 2018  . Arthralgia 10/04/2016  . Arthritis    hands  . Benign essential HTN 12/07/2006  . Breast cancer ([place])   . Breast cancer screening 11/15/2015  . Chronic kidney disease, stage II (mild) 11/15/2015  . Chronic left shoulder pain 11/08/2014  . 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 Family history of diabetes mellitus 06/12/2016   Mother and several other relatives  . HLD (hyperlipidemia) 06/15/2015  . Hx of right breast biopsy 01/17/2016  . Hyperlipidemia   . Hypertension   . Medication monitoring encounter 11/15/2015  . Motion sickness    ocean ships  . Numbness and tingling in left hand 11/07/2014  . Obesity 10/25/2015  . Obesity (BMI 30.0-34.9) 10/25/2015  . Positive ANA (antinuclear antibody) 10/04/2016  . Prediabetes   . Preventative health care 11/15/2015  . Vitamin D deficiency 10/29/2015   Past Surgical History:  Procedure Laterality Date  . ABDOMINAL HYSTERECTOMY    . BREAST BIOPSY Right 01/15/2016   neg- core  . BREAST BIOPSY Right 12/30/2016   stereo bx for calcifications: DCIS  . BREAST BIOPSY Right 01/08/2017   path pending  . BREAST LUMPECTOMY WITH NEEDLE LOCALIZATION Right 01/30/2017   Procedure: BREAST LUMPECTOMY WITH NEEDLE LOCALIZATION;  Surgeon: Vickie Epley, MD;  Location: [facility];  Service: General;  Laterality: Right;  . COLONOSCOPY WITH PROPOFOL N/A 11/19/2016   Procedure: COLONOSCOPY WITH PROPOFOL;  Surgeon: Jonathon Bellows, MD;  Location: [place];  Service: Endoscopy;  Laterality: N/A;  . ESOPHAGOGASTRODUODENOSCOPY (EGD) WITH PROPOFOL N/A 11/19/2016   Procedure: ESOPHAGOGASTRODUODENOSCOPY (EGD) WITH PROPOFOL;  Surgeon: Jonathon Bellows, MD;  Location: [place];  Service: Endoscopy;  Laterality: N/A;  . GIVENS CAPSULE STUDY N/A 12/12/2016   Procedure: GIVENS CAPSULE STUDY;  Surgeon: Jonathon Bellows, MD;  Location: [facility] ENDOSCOPY;  Service: Gastroenterology;  Laterality: N/A;  .  TOTAL MASTECTOMY Right 01/30/2017   Procedure: TOTAL MASTECTOMY;  Surgeon: Vickie Epley, MD;  Location: [facility];  Service: General;  Laterality: Right;   Family History  Problem Relation Age of Onset  . Cancer Mother 12       liver cancer  . Diabetes Mother   . Hypertension Mother   . Glomerulonephritis Father   . Diabetes Sister   . Glomerulonephritis Sister   . Cancer Sister 62       throat cancer  . Heart disease Sister   . Diabetes Sister   . Diabetes Sister   . COPD Sister   . Diabetes Sister   . Breast cancer Cousin        Currently undergoing systemic chemotherapy (01/2017)  . Varicose Veins Neg Hx    Social History   Tobacco Use  . Smoking status: Never Smoker  . Smokeless tobacco: Never Used  Substance Use Topics  . Alcohol use: No    Alcohol/week: 0.0 oz  . Drug use: No    Interim medical history since last visit reviewed. Allergies and medications reviewed  Review of Systems Per HPI unless specifically indicated above     Objective:    BP (!) 144/90 (BP Location: Left Arm, Patient Position: Sitting, Cuff Size: Large)   Pulse 86   Temp 97.8 F (36.6 C) (Oral)   Ht 5\' 1"  (1.549 m)   Wt 195 lb 8 oz (88.7 kg)   SpO2 98%   BMI 36.94 kg/m   Wt Readings from Last 3 Encounters:  05/07/17 195 lb 8 oz (88.7 kg)  04/20/17 194 lb (88 kg)  04/14/17 194 lb 12.8 oz (88.4 kg)    Physical Exam  Constitutional: She appears well-developed and well-nourished. No distress.  obese  HENT:  Head: Normocephalic and atraumatic.  Eyes: EOM are normal. No scleral icterus.  Neck: No thyromegaly present.  Cardiovascular: Normal rate, regular rhythm and normal heart sounds.  No murmur heard. Pulmonary/Chest: Effort normal and breath sounds normal. No respiratory distress. She has no wheezes.  Abdominal: Soft. Bowel sounds are normal. She exhibits no distension.  Musculoskeletal: Normal range of motion. She exhibits no edema.  Neurological: She is alert. She  exhibits normal muscle tone.  Skin: Skin is warm and dry. She is not diaphoretic. No pallor.  No nailbed pallor  Psychiatric: She has a normal mood and affect. Her behavior is normal. Judgment and thought content normal. Her mood appears not anxious. She does not exhibit a depressed mood.   Results for orders placed or performed in visit on 04/14/17  CBC  Result Value Ref Range   WBC 5.0 3.6 - 11.0 K/uL   RBC 3.80 3.80 - 5.20 MIL/uL   Hemoglobin 12.0 12.0 - 16.0 g/dL   HCT 35.5 35.0 - 47.0 %   MCV 93.6 80.0 - 100.0 fL   MCH 31.6  26.0 - 34.0 pg   MCHC 33.8 32.0 - 36.0 g/dL   RDW 14.2 11.5 - 14.5 %   Platelets 217 150 - 440 K/uL  Comprehensive metabolic panel  Result Value Ref Range   Sodium 138 135 - 145 mmol/L   Potassium 3.7 3.5 - 5.1 mmol/L   Chloride 103 101 - 111 mmol/L   CO2 27 22 - 32 mmol/L   Glucose, Bld 79 65 - 99 mg/dL   BUN 13 6 - 20 mg/dL   Creatinine, Ser 0.89 0.44 - 1.00 mg/dL   Calcium 8.9 8.9 - 10.3 mg/dL   Total Protein 7.0 6.5 - 8.1 g/dL   Albumin 3.5 3.5 - 5.0 g/dL   AST 18 15 - 41 U/L   ALT 19 14 - 54 U/L   Alkaline Phosphatase 84 38 - 126 U/L   Total Bilirubin 0.3 0.3 - 1.2 mg/dL   GFR calc non Af Amer >60 >60 mL/min   GFR calc Af Amer >60 >60 mL/min   Anion gap 8 5 - 15      Assessment & Plan:   Problem List Items Addressed This Visit      Cardiovascular and Mediastinum   Benign essential HTN    Encouraged weight loss, DASH guidelines; patient to monitor her BP and notify me if not under 140 (at least) or under 130 (ideal); she agrees      Relevant Medications   amLODipine (NORVASC) 10 MG tablet     Other   Vitamin B12 deficiency    Encouraged patient to start SL vitamin B12, which should help with energy and may assist with weight loss      Prediabetes    Reviewed last glucose readings; she was glad to hear perhaps no blood today; with all of her blood draws and treatments, I opted to pass on blood work; will check glucose and A1c at next  visit      Obesity    Encouraged patient to lose five pounds over the next month; encouraged her to set an attainable goal and she wants to get down to 175 pounds; she said over the next year, not six months, but I encouraged her to try to do this sooner than a whole year; decreasing 500 kcal a day net will result in 1 pound of fat lost per week, and 250 kcal net decrease will help her lose 0.5 pound of fat per week      Need for 23-polyvalent pneumococcal polysaccharide vaccine    Patient did not want to have the PPSV-23 vaccine today, but will return in one month for this with CMA      HLD (hyperlipidemia)    Recheck labs at f/u; try to cut down on hot dogs and work on weight loss      Relevant Medications   amLODipine (NORVASC) 10 MG tablet   DCIS (ductal carcinoma in situ) of breast    Continue Arimidex; f/u with oncologist and surgeon          Follow up plan: Return in about 6 months (around 11/04/2017) for twenty minute follow-up with fasting labs; one month with CMA for pneumonia vaccine.  An after-visit summary was printed and given to the patient at [place].  Please see the patient instructions which may contain other information and recommendations beyond what is mentioned above in the assessment and plan.  Meds ordered this encounter  Medications  . amLODipine (NORVASC) 10 MG tablet    Sig: Take 1  tablet (10 mg total) by mouth daily.    Dispense:  90 tablet    Refill:  3    No orders of the defined types were placed in this encounter.

## 2017-05-07 NOTE — Assessment & Plan Note (Signed)
Recheck labs at f/u; try to cut down on hot dogs and work on weight loss

## 2017-05-07 NOTE — Assessment & Plan Note (Signed)
Encouraged weight loss, DASH guidelines; patient to monitor her BP and notify me if not under 140 (at least) or under 130 (ideal); she agrees

## 2017-05-07 NOTE — Assessment & Plan Note (Signed)
Reviewed last glucose readings; she was glad to hear perhaps no blood today; with all of her blood draws and treatments, I opted to pass on blood work; will check glucose and A1c at next visit

## 2017-05-18 ENCOUNTER — Ambulatory Visit
Admission: RE | Admit: 2017-05-18 | Discharge: 2017-05-18 | Disposition: A | Discharge status: Home / Self Care | Payer: BC Managed Care – PPO | Source: Ambulatory Visit | Attending: Radiation Oncology | Admitting: Radiation Oncology

## 2017-05-18 ENCOUNTER — Other Ambulatory Visit: Payer: Self-pay

## 2017-05-18 ENCOUNTER — Encounter: Payer: Self-pay | Admitting: Radiation Oncology

## 2017-05-18 VITALS — BP 168/101 | HR 80 | Temp 98.3°F | Resp 20 | Wt 192.0 lb

## 2017-05-18 DIAGNOSIS — D0511 Intraductal carcinoma in situ of right breast: Secondary | ICD-10-CM | POA: Diagnosis not present

## 2017-05-18 DIAGNOSIS — L7634 Postprocedural seroma of skin and subcutaneous tissue following other procedure: Secondary | ICD-10-CM | POA: Insufficient documentation

## 2017-05-18 DIAGNOSIS — Z79811 Long term (current) use of aromatase inhibitors: Secondary | ICD-10-CM | POA: Insufficient documentation

## 2017-05-18 DIAGNOSIS — Z170 Estrogen receptor positive status [ER+]: Secondary | ICD-10-CM | POA: Diagnosis not present

## 2017-05-18 DIAGNOSIS — Z923 Personal history of irradiation: Secondary | ICD-10-CM | POA: Diagnosis not present

## 2017-05-18 NOTE — Progress Notes (Signed)
Radiation Oncology Follow up Note  Name: Marissa Nelson   Date:   05/18/2017 MRN:  [id number] DOB: August 29, 1950    This 67 y.o. female presents to the clinic today for one-month follow-up status post whole breast radiation to her right breast for ER/PR positive ductal carcinoma in situ.  REFERRING PROVIDER: Arnetha Courser, MD  HPI: Patient is a 67 year old female now out 1 month having completed whole breast radiation to her right breast for ER/PR positive ductal carcinoma in situ seen today in routine follow-up she is doing fairly well although she states she's having significant pain in her right breast. By exam she does have a large seroma. She has been seen by surgery postop although they've done no drainage of that. She otherwise is without complaint..  COMPLICATIONS OF TREATMENT: none  FOLLOW UP COMPLIANCE: keeps appointments   PHYSICAL EXAM:  BP (!) 168/101   Pulse 80   Temp 98.3 F (36.8 C)   Resp 20   Wt 192 lb 0.3 oz (87.1 kg)   BMI 36.28 kg/m  Right breast is a large seroma. She still has some hyperpigmentation the skin. No other dominant mass or nodularity is noted in either breast in 2 positions examined. No axillary or supraclavicular adenopathy is appreciated. Well-developed well-nourished patient in NAD. HEENT reveals PERLA, EOMI, discs not visualized.  Oral cavity is clear. No oral mucosal lesions are identified. Neck is clear without evidence of cervical or supraclavicular adenopathy. Lungs are clear to A&P. Cardiac examination is essentially unremarkable with regular rate and rhythm without murmur rub or thrill. Abdomen is benign with no organomegaly or masses noted. Motor sensory and DTR levels are equal and symmetric in the upper and lower extremities. Cranial nerves II through XII are grossly intact. Proprioception is intact. No peripheral adenopathy or edema is identified. No motor or sensory levels are noted. Crude visual fields are within normal  range.  RADIOLOGY RESULTS: No current films for review  PLAN: Patient is been started on arimadex tolerate that well. I've asked her to call her surgeon should her pain persist as they may be able to do a needle drainage of her seroma cavity. Otherwise I've asked to see her back in 4-5 months for follow-up. She continues on arimadex without side effect. Patient is to call with any concerns.  I would like to take this opportunity to thank you for allowing me to participate in the care of your patient.Noreene Filbert, MD

## 2017-05-21 ENCOUNTER — Telehealth: Payer: Self-pay | Admitting: Family Medicine

## 2017-05-21 NOTE — Telephone Encounter (Signed)
I called pt she is concerned.  Last year around sept she states a form was filled out for her life ins. By you? But I do not see in chart/ unless it was just a request for records.  But she states she is having a time and that she had no clue and nobody told her she has been diagnosed with chronic kidney disease type 11.  Please call her and explain what that means.

## 2017-05-21 NOTE — Telephone Encounter (Signed)
Dr. Vicente Masson stopped her NSAIDs back in 2017 Marissa Nelson told her on October 31, 2015 that her kidney function was low and aspirin was stopped (see under the lab tab) It's been in several of her AVS It just means that her kidneys are not working as well as they did when she was 67 years old She should avoid NSAIDs and stay well-hydrated Most Americans over the age of 4 are probably in stage 2 CKD We'll just follow that over the years She will not likely ever had a serious problem with her kidneys or end on dialysis or anything like that We'll just watch this

## 2017-05-21 NOTE — Telephone Encounter (Signed)
Copied from [place] [id number]. Topic: General - Other >> May 21, 2017  2:09 PM Synthia Innocent wrote: Reason for CRM: Patient had insurance paperwork sent over, has a question.

## 2017-05-21 NOTE — Telephone Encounter (Signed)
Pt.notified

## 2017-06-08 ENCOUNTER — Ambulatory Visit (INDEPENDENT_AMBULATORY_CARE_PROVIDER_SITE_OTHER): Payer: BC Managed Care – PPO

## 2017-06-08 DIAGNOSIS — Z23 Encounter for immunization: Secondary | ICD-10-CM

## 2017-06-19 ENCOUNTER — Inpatient Hospital Stay: Payer: BC Managed Care – PPO

## 2017-06-19 ENCOUNTER — Inpatient Hospital Stay: Payer: BC Managed Care – PPO | Admitting: Oncology

## 2017-06-25 ENCOUNTER — Inpatient Hospital Stay (HOSPITAL_BASED_OUTPATIENT_CLINIC_OR_DEPARTMENT_OTHER): Payer: BC Managed Care – PPO | Admitting: Oncology

## 2017-06-25 ENCOUNTER — Inpatient Hospital Stay: Payer: BC Managed Care – PPO | Attending: Oncology

## 2017-06-25 VITALS — BP 154/98 | HR 71 | Temp 97.2°F | Resp 18 | Wt 192.0 lb

## 2017-06-25 DIAGNOSIS — Z90710 Acquired absence of both cervix and uterus: Secondary | ICD-10-CM | POA: Diagnosis not present

## 2017-06-25 DIAGNOSIS — I10 Essential (primary) hypertension: Secondary | ICD-10-CM | POA: Diagnosis not present

## 2017-06-25 DIAGNOSIS — Z170 Estrogen receptor positive status [ER+]: Secondary | ICD-10-CM | POA: Diagnosis not present

## 2017-06-25 DIAGNOSIS — Z8543 Personal history of malignant neoplasm of ovary: Secondary | ICD-10-CM | POA: Diagnosis not present

## 2017-06-25 DIAGNOSIS — N951 Menopausal and female climacteric states: Secondary | ICD-10-CM | POA: Insufficient documentation

## 2017-06-25 DIAGNOSIS — Z79811 Long term (current) use of aromatase inhibitors: Secondary | ICD-10-CM | POA: Insufficient documentation

## 2017-06-25 DIAGNOSIS — D0511 Intraductal carcinoma in situ of right breast: Secondary | ICD-10-CM

## 2017-06-25 LAB — COMPREHENSIVE METABOLIC PANEL
ALBUMIN: 3.9 g/dL (ref 3.5–5.0)
ALT: 17 U/L (ref 14–54)
AST: 23 U/L (ref 15–41)
Alkaline Phosphatase: 85 U/L (ref 38–126)
Anion gap: 6 (ref 5–15)
BUN: 22 mg/dL — ABNORMAL HIGH (ref 6–20)
CHLORIDE: 102 mmol/L (ref 101–111)
CO2: 28 mmol/L (ref 22–32)
CREATININE: 1.13 mg/dL — AB (ref 0.44–1.00)
Calcium: 9.7 mg/dL (ref 8.9–10.3)
GFR calc non Af Amer: 49 mL/min — ABNORMAL LOW (ref >60)
GFR, EST AFRICAN AMERICAN: 57 mL/min — AB (ref >60)
GLUCOSE: 102 mg/dL — AB (ref 65–99)
Potassium: 3.7 mmol/L (ref 3.5–5.1)
SODIUM: 136 mmol/L (ref 135–145)
Total Bilirubin: 0.4 mg/dL (ref 0.3–1.2)
Total Protein: 7.7 g/dL (ref 6.5–8.1)

## 2017-06-29 ENCOUNTER — Encounter: Payer: Self-pay | Admitting: Oncology

## 2017-06-29 ENCOUNTER — Telehealth: Payer: Self-pay | Admitting: Oncology

## 2017-06-29 NOTE — Telephone Encounter (Signed)
Bone Study and MD f/u appt mailed to patient.

## 2017-06-29 NOTE — Progress Notes (Signed)
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 Patient is a 67 year old African-American female with no significant comorbidities other than hypertension. She underwent screening bilateral mammogram in August 2017 which showed calcifications in her right breast. This was followed by a diagnostic mammogram which showed 2 sets of calcifications: 1 was 4 mm in the upper outer right breast and an additional 7 mm calcification in the central subareolar right breast. The 4 mm group of calcifications was biopsies and wasnegative for malignancy. Repeat mammogram was recommended in 6 months. This mammogram showed a new 15 mm calcifications in the upper outer quadrant of the right breast measuring 12 x 15 x 13 mm. Stable group of benign-appearing calcifications in the right breast upper outer quadrant measuring 5 mm.  2. Patient underwent 2 sets of biopsies. Biopsies from the right upper outer quadrant showed intermediate grade DCIS with comedonecrosis and associated calcifications. ER greater than 90% positive and PR was 11-50% positive. Biopsy of the right lower quadrant of the right breast showed fibroadenomatous changes with calcifications.  3. Patient has been seen by Dr. Layla Maw is scheduled to undergo lumpectomy on 01/30/2017  4. Patient had menarche at the age of 89. 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 History of breast cancer in one of her sisters. She is G2 P2 L2. She did not breast-feed.  5.Patient  underwent lumpectomy on 01/30/2017 which showed a 15 mm DCIS, grade 2 with comedonecrosis. Margins were -3 mm from the closest medial margin lymph nodes were not submitted. Pathology stagepTisNx.ER greater than 90% positive PR 11-50% positive  6. Patient completed adjuvant RT and started arimidex in jan 2019  Interval history- tolerating arimidex except for mild hot flashes which are self limited. Denies any arthralgias or other side effects  ECOG PS- 0 Pain scale- 0   Review of systems- Review of Systems  Constitutional: Negative for chills, fever, malaise/fatigue and weight loss.  HENT: Negative for congestion, ear discharge and nosebleeds.   Eyes: Negative for blurred vision.  Respiratory: Negative for cough, hemoptysis, sputum production, shortness of breath and wheezing.   Cardiovascular: Negative for chest pain, palpitations, orthopnea and claudication.  Gastrointestinal: Negative for abdominal pain, blood in stool, constipation, diarrhea, heartburn, melena, nausea and vomiting.  Genitourinary: Negative for dysuria, flank pain, frequency, hematuria and urgency.  Musculoskeletal: Negative for back pain, joint pain and myalgias.  Skin: Negative for rash.  Neurological: Negative for dizziness, tingling, focal weakness, seizures, weakness and headaches.  Endo/Heme/Allergies: Does not bruise/bleed easily.  Psychiatric/Behavioral: Negative for depression and suicidal ideas. The patient does not have insomnia.       Allergies  Allergen Reactions  . Shellfish Allergy Swelling    angioedema  . Penicillin G Rash    Has patient had a PCN reaction causing immediate rash, facial/tongue/throat swelling, SOB or lightheadedness with hypotension: Yes Has patient had a PCN reaction causing severe rash involving mucus membranes or skin necrosis: Yes Has patient had a PCN reaction that required hospitalization: No Has patient had a PCN reaction occurring within the  last 10 years: No If all of  the above answers are "NO", then may proceed with Cephalosporin use.      Past Medical History:  Diagnosis Date  . Abnormal mammogram of right breast 01/17/2016   Recommendation: Six month follow-up diagnostic mammogram of the right breast for an additional group of probably benign Calcifications. Sept 2017 --> due March 2018  . Arthralgia 10/04/2016  . Arthritis    hands  . Benign essential HTN 12/07/2006  . Breast cancer ([place])   . Breast cancer screening 11/15/2015  . Chronic kidney disease, stage II (mild) 11/15/2015  . Chronic left shoulder pain 11/08/2014  . 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 Family history of diabetes mellitus 06/12/2016   Mother and several other relatives  . HLD (hyperlipidemia) 06/15/2015  . Hx of right breast biopsy 01/17/2016  . Hyperlipidemia   . Hypertension   . Medication monitoring encounter 11/15/2015  . Motion sickness    ocean ships  . Numbness and tingling in left hand 11/07/2014  . Obesity 10/25/2015  . Obesity (BMI 30.0-34.9) 10/25/2015  . Positive ANA (antinuclear antibody) 10/04/2016  . Prediabetes   . Preventative health care 11/15/2015  . Vitamin D deficiency 10/29/2015     Past Surgical History:  Procedure Laterality Date  . ABDOMINAL HYSTERECTOMY    . BREAST BIOPSY Right 01/15/2016   neg- core  . BREAST BIOPSY Right 12/30/2016   stereo bx for calcifications: DCIS  . BREAST BIOPSY Right 01/08/2017   path pending  . BREAST LUMPECTOMY WITH NEEDLE LOCALIZATION Right 01/30/2017   Procedure: BREAST LUMPECTOMY WITH NEEDLE LOCALIZATION;  Surgeon: Vickie Epley, MD;  Location: [facility];  Service: General;  Laterality: Right;  . COLONOSCOPY WITH PROPOFOL N/A 11/19/2016   Procedure: COLONOSCOPY WITH PROPOFOL;  Surgeon: Jonathon Bellows, MD;  Location: [place];  Service: Endoscopy;  Laterality: N/A;  . ESOPHAGOGASTRODUODENOSCOPY (EGD) WITH PROPOFOL N/A 11/19/2016   Procedure: ESOPHAGOGASTRODUODENOSCOPY  (EGD) WITH PROPOFOL;  Surgeon: Jonathon Bellows, MD;  Location: [place];  Service: Endoscopy;  Laterality: N/A;  . GIVENS CAPSULE STUDY N/A 12/12/2016   Procedure: GIVENS CAPSULE STUDY;  Surgeon: Jonathon Bellows, MD;  Location: [facility];  Service: Gastroenterology;  Laterality: N/A;  . TOTAL MASTECTOMY Right 01/30/2017   Procedure: TOTAL MASTECTOMY;  Surgeon: Vickie Epley, MD;  Location: [facility];  Service: General;  Laterality: Right;    Social History   Socioeconomic History  . Marital status: Married    Spouse name: Not on file  . Number of children: 2  . Years of education: Not on file  . Highest education level: Not on file  Social Needs  . Financial resource strain: Not on file  . Food insecurity - worry: Not on file  . Food insecurity - inability: Not on file  . Transportation needs - medical: Not on file  . Transportation needs - non-medical: Not on file  Occupational History  . Occupation: Works in [employer] [place], [place])    Employer: [employer]  Tobacco Use  . Smoking status: Never Smoker  . Smokeless tobacco: Never Used  Substance and Sexual Activity  . Alcohol use: No    Alcohol/week: 0.0 oz  . Drug use: No  . Sexual activity: Not Currently  Other Topics Concern  . Not on file  Social History Narrative  . Not on file    Family History  Problem Relation Age of Onset  .  Cancer Mother 88       liver cancer  . Diabetes Mother   . Hypertension Mother   . Glomerulonephritis Father   . Diabetes Sister   . Glomerulonephritis Sister   . Cancer Sister 95       throat cancer  . Heart disease Sister   . Diabetes Sister   . Diabetes Sister   . COPD Sister   . Diabetes Sister   . Breast cancer Cousin        Currently undergoing systemic chemotherapy (01/2017)  . Varicose Veins Neg Hx      Current Outpatient Medications:  .  acetaminophen (TYLENOL) 325 MG tablet, Take 650 mg by mouth every 6 (six) hours as needed for mild  pain or headache. , Disp: , Rfl:  .  amLODipine (NORVASC) 10 MG tablet, Take 1 tablet (10 mg total) by mouth daily., Disp: 90 tablet, Rfl: 3 .  anastrozole (ARIMIDEX) 1 MG tablet, Take 1 tablet (1 mg total) by mouth daily., Disp: 30 tablet, Rfl: 3 .  aspirin EC 81 MG tablet, Take 1 tablet (81 mg total) by mouth daily., Disp: , Rfl:  .  calcium-vitamin D (OSCAL WITH D) 500-200 MG-UNIT tablet, Take 1 tablet by mouth., Disp: , Rfl:  .  Cyanocobalamin (VITAMIN B-12) 500 MCG SUBL, Place under the tongue., Disp: , Rfl:  .  losartan-hydrochlorothiazide (HYZAAR) 100-25 MG tablet, Take 1 tablet by mouth daily., Disp: 90 tablet, Rfl: 3  Physical exam:  Vitals:   06/25/17 1508  BP: (!) 154/98  Pulse: 71  Resp: 18  Temp: (!) 97.2 F (36.2 C)  TempSrc: Tympanic  Weight: 192 lb (87.1 kg)   Physical Exam  Constitutional: She is oriented to person, place, and time and well-developed, well-nourished, and in no distress.  HENT:  Head: Normocephalic and atraumatic.  Eyes: EOM are normal. Pupils are equal, round, and reactive to light.  Neck: Normal range of motion.  Cardiovascular: Normal rate, regular rhythm and normal heart sounds.  Pulmonary/Chest: Effort normal and breath sounds normal.  Abdominal: Soft. Bowel sounds are normal.  Neurological: She is alert and oriented to person, place, and time.  Skin: Skin is warm and dry.     CMP Latest Ref Rng & Units 06/25/2017  Glucose 65 - 99 mg/dL 102(H)  BUN 6 - 20 mg/dL 22(H)  Creatinine 0.44 - 1.00 mg/dL 1.13(H)  Sodium 135 - 145 mmol/L 136  Potassium 3.5 - 5.1 mmol/L 3.7  Chloride 101 - 111 mmol/L 102  CO2 22 - 32 mmol/L 28  Calcium 8.9 - 10.3 mg/dL 9.7  Total Protein 6.5 - 8.1 g/dL 7.7  Total Bilirubin 0.3 - 1.2 mg/dL 0.4  Alkaline Phos 38 - 126 U/L 85  AST 15 - 41 U/L 23  ALT 14 - 54 U/L 17   CBC Latest Ref Rng & Units 04/14/2017  WBC 3.6 - 11.0 K/uL 5.0  Hemoglobin 12.0 - 16.0 g/dL 12.0  Hematocrit 35.0 - 47.0 % 35.5  Platelets 150 -  440 K/uL 217    No images are attached to the encounter.  No results found.   Assessment and plan- Patient is a 67 y.o. female with right breast DCIS status post lumpectomy and radiation currently on arimidex  Patient is yet to undergo her bone density scan. She is toleratign her arimidex well except for some hot flashes which are mild and self limited. She will contiue arimidex for 5 years.  I will see her back in 3 months.  Bone density prior     Visit Diagnosis 1. Ductal carcinoma in situ (DCIS) of right breast      Dr. Randa Evens, MD, MPH [facility] Pager- [phone] 06/29/2017 8:25 AM

## 2017-07-20 ENCOUNTER — Other Ambulatory Visit: Payer: Self-pay | Admitting: Family Medicine

## 2017-07-20 DIAGNOSIS — I10 Essential (primary) hypertension: Secondary | ICD-10-CM

## 2017-07-20 MED ORDER — LOSARTAN POTASSIUM-HCTZ 100-25 MG PO TABS: 1.0000 | ORAL_TABLET | Freq: Every day | ORAL | 0 refills | Status: DC

## 2017-07-20 MED ORDER — METOPROLOL SUCCINATE ER 25 MG PO TB24: 25.0000 mg | ORAL_TABLET | Freq: Every day | ORAL | 0 refills | Status: DC

## 2017-07-20 NOTE — Telephone Encounter (Signed)
Copied from [place]. Topic: Quick Communication - Rx Refill/Question >> Jul 20, 2017  3:33 PM Synthia Innocent wrote: Medication: losartan-hydrochlorothiazide (HYZAAR) 100-25 MG tablet, requesting 90 day supply  Has the patient contacted their pharmacy? Yes.     (Agent: If no, request that the patient contact the pharmacy for the refill.)   Preferred Pharmacy (with phone number or street name): [facility] on [address]   Agent: Please be advised that RX refills may take up to 3 business days. We ask that you follow-up with your pharmacy.

## 2017-07-20 NOTE — Telephone Encounter (Signed)
Please ask patient to make an appt for her BP; last 3 readings (here and at cancer center) not controlled; I'll refill the losartan-hctz and have her continue the amlodipine, and add a new medicine, TOPROL (metoprolol) Start that now and we'll see her here in the next 7-10 days for a recheck with Dr. Sanda Klein or NP In the meantime, really stress the DASH guidelines, no decongestants, no salt substitutes; no black licorice, no black jelly beans; hydrate well with caffeine-free beverages, and limit red meat and processed meat We'll see her soon

## 2017-07-21 NOTE — Telephone Encounter (Signed)
Called pt informed her of new medication. Also scheduled pt for appt on 07/27/17 w/ Benjamine Mola.

## 2017-07-27 ENCOUNTER — Encounter: Payer: Self-pay | Admitting: Nurse Practitioner

## 2017-07-27 ENCOUNTER — Ambulatory Visit (INDEPENDENT_AMBULATORY_CARE_PROVIDER_SITE_OTHER): Payer: BC Managed Care – PPO | Admitting: Nurse Practitioner

## 2017-07-27 VITALS — BP 130/82 | HR 70 | Temp 98.2°F | Resp 14 | Ht 61.0 in | Wt 194.3 lb

## 2017-07-27 DIAGNOSIS — Z6836 Body mass index (BMI) 36.0-36.9, adult: Secondary | ICD-10-CM

## 2017-07-27 DIAGNOSIS — G4733 Obstructive sleep apnea (adult) (pediatric): Secondary | ICD-10-CM

## 2017-07-27 DIAGNOSIS — I10 Essential (primary) hypertension: Secondary | ICD-10-CM | POA: Diagnosis not present

## 2017-07-27 DIAGNOSIS — N182 Chronic kidney disease, stage 2 (mild): Secondary | ICD-10-CM | POA: Diagnosis not present

## 2017-07-27 DIAGNOSIS — Z79899 Other long term (current) drug therapy: Secondary | ICD-10-CM | POA: Diagnosis not present

## 2017-07-27 NOTE — Assessment & Plan Note (Signed)
Ordered BMP for next month to monitor kidney function will add back ARB PRN for nephroprotection pending results

## 2017-07-27 NOTE — Assessment & Plan Note (Signed)
Patient taking amlodipine 10mg  daily and toprol XL 25mg  daily. Will keep BP log and follow-up earlier if elevated. Come in 1 month to follow-up on kidney function

## 2017-07-27 NOTE — Progress Notes (Addendum)
Name: Marissa Nelson   MRN: [id number]    DOB: 12/29/1950   Date:07/27/2017       Progress Note  Subjective  Chief Complaint  Chief Complaint  Patient presents with  . Hypertension    HPI  Hypertension Patient was seen on 05/07/2017 for regular follow-up of chronic disease management and BP noted to be elevated at 144/90 and patient had noted increased BP readings after radiation treatments. Amlodipine was increased from 5mg  daily to 10mg  daily. Patient had oncology follow-up with Dr. Janese Banks on 06/25/2017 and blood pressure was noted to be 154/98. Patient denies side effects- no leg swelling. Patient states had a recall on her losaratan-HCTZ last Tuesday and was switched to Toprol-XL 25mg  on Thursday.   Patient has had hypertension over 40 years.   Checking blood pressure away from here?  yes How often? Once a week  Average over last two weeks:  154 / 61  Feels blood pressure is under fair control  Hypertension-associated complications:  none, nephropathy, retinopathy and erectile dysfunction sts sees optometrist every two years, last GFR 57  If taking medicines, are you taking them regularly?  yes  Siblings / family history: Does high blood pressure run in your family?   YES  Salt:  Trying to limit sodium / salt when buying foods at the grocery store?  yes Do you try to limit added salt when cooking and at the table?  NO  Sweets/licorice:  Do you eat a lot of sweets or eat black licorice?  no  Saturated fats: Do you eat a lot of foods like bacon, sausage, pepperoni, cheeseburgers, hot dogs, bologna, and cheese?  no  Sedentary lifestyle:  Exercise/activity level:  seldom (a few times a month)  Steroids/Non-steroidals:  Have you had a recent cortisone shot in the last few months?  no  Do you take prednisone or prescription NSAIDs or take OTCS NSAIDs such as ibuprofen, Motrin, Advil, Aleve, or naproxen? no   Smoking: Do you smoke?  no  Snoring / sleep apnea: Do you snore or  have sleep apnea?  YES If diagnosed with sleep apnea, do you use your machine?  NO - never completed sleep study in the past because she had surgery scheduled.  [place] Questionnaire completed: Positive for snoring, slightly louder than breathing, 1-2 x a weeks, has bothered husband, often tired after sleep, and fatigued almost every day, never nodded off while driving. Score- Positive and BMI greater than 36.7   Stress: Do you feel like you are under excessive stress or that your stress level affects your blood pressure at times?    no  Stroh's (alcohol): Do you drink alcohol  no  Sudafed (decongestants): Do you use any OTC decongestant products like Allegra-D, Claritin-D, Zyrtec-D, Tylenol Cold and Sinus, etc.?  no    Patient Active Problem List   Diagnosis Date Noted  . Vitamin B12 deficiency 05/07/2017  . Need for 23-polyvalent pneumococcal polysaccharide vaccine 05/07/2017  . 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 Positive ANA (antinuclear antibody) 10/04/2016  . Arthralgia 10/04/2016  . Prediabetes 06/20/2016  . Hx of right breast biopsy 01/17/2016  . Medication monitoring encounter 11/15/2015  . Preventative health care 11/15/2015  . Chronic kidney disease, stage II (mild) 11/15/2015  . Vitamin D deficiency 10/29/2015  . Obesity 10/25/2015  . HLD (hyperlipidemia) 06/15/2015  . Obstructive apnea 06/15/2015  . Chronic left shoulder pain 11/08/2014  . Numbness and tingling in left hand 11/07/2014  . Benign  essential HTN 12/07/2006    Social History   Tobacco Use  . Smoking status: Never Smoker  . Smokeless tobacco: Never Used  Substance Use Topics  . Alcohol use: No    Alcohol/week: 0.0 oz     Current Outpatient Medications:  .  acetaminophen (TYLENOL) 325 MG tablet, Take 650 mg by mouth every 6 (six) hours as needed for mild pain or headache. , Disp: , Rfl:  .  amLODipine (NORVASC) 10 MG tablet, Take 1 tablet (10 mg total) by mouth daily., Disp: 90  tablet, Rfl: 3 .  anastrozole (ARIMIDEX) 1 MG tablet, Take 1 tablet (1 mg total) by mouth daily., Disp: 30 tablet, Rfl: 3 .  aspirin EC 81 MG tablet, Take 1 tablet (81 mg total) by mouth daily., Disp: , Rfl:  .  calcium-vitamin D (OSCAL WITH D) 500-200 MG-UNIT tablet, Take 1 tablet by mouth., Disp: , Rfl:  .  Cyanocobalamin (VITAMIN B-12) 500 MCG SUBL, Place under the tongue., Disp: , Rfl:  .  metoprolol succinate (TOPROL-XL) 25 MG 24 hr tablet, Take 1 tablet (25 mg total) by mouth daily., Disp: 90 tablet, Rfl: 0 .  losartan-hydrochlorothiazide (HYZAAR) 100-25 MG tablet, Take 1 tablet by mouth daily. (Patient not taking: Reported on 07/27/2017), Disp: 90 tablet, Rfl: 0  Allergies  Allergen Reactions  . Shellfish Allergy Swelling    angioedema  . Penicillin G Rash    Has patient had a PCN reaction causing immediate rash, facial/tongue/throat swelling, SOB or lightheadedness with hypotension: Yes Has patient had a PCN reaction causing severe rash involving mucus membranes or skin necrosis: Yes Has patient had a PCN reaction that required hospitalization: No Has patient had a PCN reaction occurring within the last 10 years: No If all of the above answers are "NO", then may proceed with Cephalosporin use.     ROS  Constitutional: Negative for fever or weight change.  Respiratory: Negative for cough and shortness of breath.   Cardiovascular: Negative for chest pain or palpitations.  Gastrointestinal: Negative for abdominal pain, no bowel changes.  Musculoskeletal: Negative for gait problem or joint swelling.  Skin: Negative for rash.  Neurological: Negative for dizziness or headache.  No other specific complaints in a complete review of systems (except as listed in HPI above).  Objective  Vitals:   07/27/17 1516  BP: 130/82  Pulse: 70  Resp: 14  Temp: 98.2 F (36.8 C)  TempSrc: Oral  SpO2: 99%  Weight: 194 lb 4.8 oz (88.1 kg)    Body mass index is 36.71 kg/m.  Nursing Note  and Vital Signs reviewed.  Physical Exam  Constitutional: Patient appears well-developed and well-nourished. Obese No distress.  HEENT: head atraumatic, normocephalic, pupils equal and reactive to light, no maxillary or frontal sinus tenderness , neck supple without lymphadenopathy, no carotid bruits, oropharynx pink and moist without exudate Cardiovascular: Normal rate, regular rhythm, S1/S2 present.  No murmur or rub heard. No BLE edema. Pulmonary/Chest: Effort normal and breath sounds clear. No respiratory distress or retractions. Abdominal: Soft and non-tender, bowel sounds present. Psychiatric: Patient has a normal mood and affect. behavior is normal. Judgment and thought content normal.  No results found for this or any previous visit (from the past 72 hour(s)).  Assessment & Plan  Problem List Items Addressed This Visit      Cardiovascular and Mediastinum   Benign essential HTN    Patient taking amlodipine 10mg  daily and toprol XL 25mg  daily. Will keep BP log and follow-up earlier if  elevated. Come in 1 month to follow-up on kidney function      Relevant Orders   BASIC METABOLIC PANEL WITH GFR     Respiratory   Obstructive apnea    Sleep study ordered by GI last year, order still valid, patient given number to call and set up appointment         Genitourinary   Chronic kidney disease, stage II (mild)    Ordered BMP for next month to monitor kidney function will add back ARB PRN for nephroprotection pending results      Relevant Orders   BASIC METABOLIC PANEL WITH GFR     Other   Obesity    Discussed DASH diet and increase in exercise; sts relatively sedentary during the winter, but she does enjoy walking; encouraged to walk around more frequently.        Other Visit Diagnoses    Medication management    -  Primary   Relevant Orders   BASIC METABOLIC PANEL WITH GFR      -Red flags and when to present for emergency care or RTC including fever >101.13F, chest  pain, shortness of breath, new/worsening/un-resolving symptoms, reviewed with patient at time of visit. Follow up and care instructions discussed and provided in AVS.  I have reviewed this encounter including the documentation in this note and/or discussed this patient with the provider, Suezanne Cheshire DNP AGNP-C. I am certifying that I agree with the content of this note as supervising physician. Enid Derry, [facility] 07/27/2017, 4:57 PM

## 2017-07-27 NOTE — Assessment & Plan Note (Signed)
Sleep study ordered by GI last year, order still valid, patient given number to call and set up appointment

## 2017-07-27 NOTE — Assessment & Plan Note (Signed)
Discussed DASH diet and increase in exercise; sts relatively sedentary during the winter, but she does enjoy walking; encouraged to walk around more frequently.

## 2017-07-27 NOTE — Patient Instructions (Addendum)
It was a pleasure meeting you today! Please keep a log of your blood pressures; check it at least 3 times a week for the next 2-3 weeks. If they are high set up an follow-up appointment so we can adjust your medications. Your blood pressure on the new medication today is good. I put in a lab order to check your kidney function for next month- just come in for a lab only visit.   Call [phone] to schedule your sleep study. Your order was put in by GI and is still valid until the end of the month so call today!     DASH Eating Plan DASH stands for "Dietary Approaches to Stop Hypertension." The DASH eating plan is a healthy eating plan that has been shown to reduce high blood pressure (hypertension). It may also reduce your risk for type 2 diabetes, heart disease, and stroke. The DASH eating plan may also help with weight loss. What are tips for following this plan? General guidelines  Avoid eating more than 2,300 mg (milligrams) of salt (sodium) a day. If you have hypertension, you may need to reduce your sodium intake to 1,500 mg a day.  Limit alcohol intake to no more than 1 drink a day for nonpregnant women and 2 drinks a day for men. One drink equals 12 oz of beer, 5 oz of wine, or 1 oz of hard liquor.  Work with your health care provider to maintain a healthy body weight or to lose weight. Ask what an ideal weight is for you.  Get at least 30 minutes of exercise that causes your heart to beat faster (aerobic exercise) most days of the week. Activities may include walking, swimming, or biking.  Work with your health care provider or diet and nutrition specialist (dietitian) to adjust your eating plan to your individual calorie needs. Reading food labels  Check food labels for the amount of sodium per serving. Choose foods with less than 5 percent of the Daily Value of sodium. Generally, foods with less than 300 mg of sodium per serving fit into this eating plan.  To find whole grains, look  for the word "whole" as the first word in the ingredient list. Shopping  Buy products labeled as "low-sodium" or "no salt added."  Buy fresh foods. Avoid canned foods and premade or frozen meals. Cooking  Avoid adding salt when cooking. Use salt-free seasonings or herbs instead of table salt or sea salt. Check with your health care provider or pharmacist before using salt substitutes.  Do not fry foods. Cook foods using healthy methods such as baking, boiling, grilling, and broiling instead.  Cook with heart-healthy oils, such as olive, canola, soybean, or sunflower oil. Meal planning   Eat a balanced diet that includes: ? 5 or more servings of fruits and vegetables each day. At each meal, try to fill half of your plate with fruits and vegetables. ? Up to 6-8 servings of whole grains each day. ? Less than 6 oz of lean meat, poultry, or fish each day. A 3-oz serving of meat is about the same size as a deck of cards. One egg equals 1 oz. ? 2 servings of low-fat dairy each day. ? A serving of nuts, seeds, or beans 5 times each week. ? Heart-healthy fats. Healthy fats called Omega-3 fatty acids are found in foods such as flaxseeds and coldwater fish, like sardines, salmon, and mackerel.  Limit how much you eat of the following: ? Canned or prepackaged foods. ?  Food that is high in trans fat, such as fried foods. ? Food that is high in saturated fat, such as fatty meat. ? Sweets, desserts, sugary drinks, and other foods with added sugar. ? Full-fat dairy products.  Do not salt foods before eating.  Try to eat at least 2 vegetarian meals each week.  Eat more home-cooked food and less restaurant, buffet, and fast food.  When eating at a restaurant, ask that your food be prepared with less salt or no salt, if possible. What foods are recommended? The items listed may not be a complete list. Talk with your dietitian about what dietary choices are best for you. Grains Whole-grain or  whole-wheat bread. Whole-grain or whole-wheat pasta. Brown rice. [place] Morrow. Bulgur. Whole-grain and low-sodium cereals. Pita bread. Low-fat, low-sodium crackers. Whole-wheat flour tortillas. Vegetables Fresh or frozen vegetables (raw, steamed, roasted, or grilled). Low-sodium or reduced-sodium tomato and vegetable juice. Low-sodium or reduced-sodium tomato sauce and tomato paste. Low-sodium or reduced-sodium canned vegetables. Fruits All fresh, dried, or frozen fruit. Canned fruit in natural juice (without added sugar). Meat and other protein foods Skinless chicken or [place]. Ground chicken or [place]. Pork with fat trimmed off. Fish and seafood. Egg whites. Dried beans, peas, or lentils. Unsalted nuts, nut butters, and seeds. Unsalted canned beans. Lean cuts of beef with fat trimmed off. Low-sodium, lean deli meat. Dairy Low-fat (1%) or fat-free (skim) milk. Fat-free, low-fat, or reduced-fat cheeses. Nonfat, low-sodium ricotta or cottage cheese. Low-fat or nonfat yogurt. Low-fat, low-sodium cheese. Fats and oils Soft margarine without trans fats. Vegetable oil. Low-fat, reduced-fat, or light mayonnaise and salad dressings (reduced-sodium). Canola, safflower, olive, soybean, and sunflower oils. Avocado. Seasoning and other foods Herbs. Spices. Seasoning mixes without salt. Unsalted popcorn and pretzels. Fat-free sweets. What foods are not recommended? The items listed may not be a complete list. Talk with your dietitian about what dietary choices are best for you. Grains Baked goods made with fat, such as croissants, muffins, or some breads. Dry pasta or rice meal packs. Vegetables Creamed or fried vegetables. Vegetables in a cheese sauce. Regular canned vegetables (not low-sodium or reduced-sodium). Regular canned tomato sauce and paste (not low-sodium or reduced-sodium). Regular tomato and vegetable juice (not low-sodium or reduced-sodium). Angie Fava. Olives. Fruits Canned fruit in a light  or heavy syrup. Fried fruit. Fruit in cream or butter sauce. Meat and other protein foods Fatty cuts of meat. Ribs. Fried meat. Berniece Salines. Sausage. Bologna and other processed lunch meats. Salami. Fatback. Hotdogs. Bratwurst. Salted nuts and seeds. Canned beans with added salt. Canned or smoked fish. Whole eggs or egg yolks. Chicken or [place] with skin. Dairy Whole or 2% milk, cream, and half-and-half. Whole or full-fat cream cheese. Whole-fat or sweetened yogurt. Full-fat cheese. Nondairy creamers. Whipped toppings. Processed cheese and cheese spreads. Fats and oils Butter. Stick margarine. Lard. Shortening. Ghee. Bacon fat. Tropical oils, such as coconut, palm kernel, or palm oil. Seasoning and other foods Salted popcorn and pretzels. Onion salt, garlic salt, seasoned salt, table salt, and sea salt. Worcestershire sauce. Tartar sauce. Barbecue sauce. Teriyaki sauce. Soy sauce, including reduced-sodium. Steak sauce. Canned and packaged gravies. Fish sauce. Oyster sauce. Cocktail sauce. Horseradish that you find on the shelf. Ketchup. Mustard. Meat flavorings and tenderizers. Bouillon cubes. Hot sauce and Tabasco sauce. Premade or packaged marinades. Premade or packaged taco seasonings. Relishes. Regular salad dressings. Where to find more information:  National Heart, Lung, and [place]: https://wilson-eaton.com/  American Heart Association: www.heart.org Summary  The DASH eating plan is  a healthy eating plan that has been shown to reduce high blood pressure (hypertension). It may also reduce your risk for type 2 diabetes, heart disease, and stroke.  With the DASH eating plan, you should limit salt (sodium) intake to 2,300 mg a day. If you have hypertension, you may need to reduce your sodium intake to 1,500 mg a day.  When on the DASH eating plan, aim to eat more fresh fruits and vegetables, whole grains, lean proteins, low-fat dairy, and heart-healthy fats.  Work with your health care provider or  diet and nutrition specialist (dietitian) to adjust your eating plan to your individual calorie needs. This information is not intended to replace advice given to you by your health care provider. Make sure you discuss any questions you have with your health care provider. Document Released: 04/10/2011 Document Revised: 04/14/2016 Document Reviewed: 04/14/2016 Elsevier Interactive Patient Education  [employer].

## 2017-07-31 ENCOUNTER — Telehealth: Payer: Self-pay

## 2017-07-31 DIAGNOSIS — D0511 Intraductal carcinoma in situ of right breast: Secondary | ICD-10-CM

## 2017-07-31 NOTE — Telephone Encounter (Signed)
I will call patient once I have her mammogram scheduled. Then ahe will need to come and see Dr. Rosana Hoes to go over results. [facility] was closed. I will call again on Monday.

## 2017-08-03 ENCOUNTER — Other Ambulatory Visit: Payer: Self-pay

## 2017-08-03 DIAGNOSIS — D0511 Intraductal carcinoma in situ of right breast: Secondary | ICD-10-CM

## 2017-08-11 DIAGNOSIS — J329 Chronic sinusitis, unspecified: Secondary | ICD-10-CM | POA: Diagnosis not present

## 2017-08-11 DIAGNOSIS — B9689 Other specified bacterial agents as the cause of diseases classified elsewhere: Secondary | ICD-10-CM | POA: Diagnosis not present

## 2017-08-11 NOTE — Telephone Encounter (Signed)
Patient's mammogram and ultrasound were scheduled and then she will follow up with Dr. Rosana Hoes. All of this information was mailed out to the patient.

## 2017-08-17 ENCOUNTER — Other Ambulatory Visit: Payer: Self-pay

## 2017-08-17 ENCOUNTER — Encounter: Payer: Self-pay | Admitting: Radiation Oncology

## 2017-08-17 ENCOUNTER — Ambulatory Visit
Admission: RE | Admit: 2017-08-17 | Discharge: 2017-08-17 | Disposition: A | Discharge status: Home / Self Care | Payer: BC Managed Care – PPO | Source: Ambulatory Visit | Attending: Radiation Oncology | Admitting: Radiation Oncology

## 2017-08-17 VITALS — BP 175/89 | HR 64 | Temp 97.2°F | Resp 18 | Wt 192.2 lb

## 2017-08-17 DIAGNOSIS — Z853 Personal history of malignant neoplasm of breast: Secondary | ICD-10-CM | POA: Insufficient documentation

## 2017-08-17 DIAGNOSIS — Z170 Estrogen receptor positive status [ER+]: Secondary | ICD-10-CM | POA: Diagnosis not present

## 2017-08-17 DIAGNOSIS — Z923 Personal history of irradiation: Secondary | ICD-10-CM | POA: Diagnosis not present

## 2017-08-17 DIAGNOSIS — Z08 Encounter for follow-up examination after completed treatment for malignant neoplasm: Secondary | ICD-10-CM | POA: Diagnosis not present

## 2017-08-17 DIAGNOSIS — D0511 Intraductal carcinoma in situ of right breast: Secondary | ICD-10-CM | POA: Diagnosis not present

## 2017-08-17 NOTE — Progress Notes (Signed)
Radiation Oncology Follow up Note  Name: Marissa Nelson   Date:   08/17/2017 MRN:  [id number] DOB: 12-20-1950    This 67 y.o. 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 She is seen today in routine follow-up and is doing well. She specifically denies breast tenderness cough or bone pain. She has a mammogram scheduled for next week..she is currently in arimadex time that well without side effect.  COMPLICATIONS OF TREATMENT: none  FOLLOW UP COMPLIANCE: keeps appointments   PHYSICAL EXAM:  BP (!) 175/89   Pulse 64   Temp (!) 97.2 F (36.2 C)   Resp 18   Wt 192 lb 3.9 oz (87.2 kg)   BMI 36.32 kg/m  Lungs are clear to A&P cardiac examination essentially unremarkable with regular rate and rhythm. No dominant mass or nodularity is noted in either breast in 2 positions examined. Incision is well-healed. No axillary or supraclavicular adenopathy is appreciated. Cosmetic result is excellent. Well-developed well-nourished patient in NAD. HEENT reveals PERLA, EOMI, discs not visualized.  Oral cavity is clear. No oral mucosal lesions are identified. Neck is clear without evidence of cervical or supraclavicular adenopathy. Lungs are clear to A&P. Cardiac examination is essentially unremarkable with regular rate and rhythm without murmur rub or thrill. Abdomen is benign with no organomegaly or masses noted. Motor sensory and DTR levels are equal and symmetric in the upper and lower extremities. Cranial nerves II through XII are grossly intact. Proprioception is intact. No peripheral adenopathy or edema is identified. No motor or sensory levels are noted. Crude visual fields are within  normal range.  RADIOLOGY RESULTS: no current films for review  PLAN: present time patient is doing well with no evidence of disease. I'm please were overall progress. She will have follow-up mammograms in the next several weeks. I have asked to see her back in 6 months for follow-up and then will start once your follow-up visits. Patient knows to call with any concerns.  I would like to take this opportunity to thank you for allowing me to participate in the care of your patient.Noreene Filbert, MD

## 2017-08-18 ENCOUNTER — Ambulatory Visit
Admission: RE | Admit: 2017-08-18 | Discharge: 2017-08-18 | Disposition: A | Discharge status: Home / Self Care | Payer: BC Managed Care – PPO | Source: Ambulatory Visit | Attending: Surgery | Admitting: Surgery

## 2017-08-18 ENCOUNTER — Other Ambulatory Visit: Payer: Self-pay | Admitting: Surgery

## 2017-08-18 DIAGNOSIS — D0511 Intraductal carcinoma in situ of right breast: Secondary | ICD-10-CM

## 2017-08-18 DIAGNOSIS — N6489 Other specified disorders of breast: Secondary | ICD-10-CM | POA: Insufficient documentation

## 2017-08-18 DIAGNOSIS — N6311 Unspecified lump in the right breast, upper outer quadrant: Secondary | ICD-10-CM | POA: Diagnosis not present

## 2017-08-18 HISTORY — DX: Personal history of irradiation: Z92.3

## 2017-08-20 ENCOUNTER — Ambulatory Visit: Payer: BC Managed Care – PPO

## 2017-08-20 ENCOUNTER — Other Ambulatory Visit: Payer: BC Managed Care – PPO

## 2017-08-25 ENCOUNTER — Ambulatory Visit
Admission: RE | Admit: 2017-08-25 | Discharge: 2017-08-25 | Disposition: A | Discharge status: Home / Self Care | Payer: BC Managed Care – PPO | Source: Ambulatory Visit | Attending: Surgery | Admitting: Surgery

## 2017-08-25 DIAGNOSIS — N6489 Other specified disorders of breast: Secondary | ICD-10-CM | POA: Diagnosis not present

## 2017-08-27 ENCOUNTER — Encounter: Payer: Self-pay | Admitting: Surgery

## 2017-08-27 ENCOUNTER — Ambulatory Visit (INDEPENDENT_AMBULATORY_CARE_PROVIDER_SITE_OTHER): Payer: BC Managed Care – PPO | Admitting: Surgery

## 2017-08-27 VITALS — BP 170/90 | HR 60 | Temp 97.9°F | Resp 14 | Ht 61.0 in | Wt 190.4 lb

## 2017-08-27 DIAGNOSIS — D0511 Intraductal carcinoma in situ of right breast: Secondary | ICD-10-CM | POA: Diagnosis not present

## 2017-08-27 NOTE — Progress Notes (Signed)
Surgical Clinic Progress/Follow-up Note   HPI:  67 y.o. Female presents to clinic for post-lumpectomy follow-up evaluation s/p Right breast lumpectomy for DCIS and recent follow-up breast imaging. Patient reports she underwent aspiration of post-lumpectomy Right breast seroma and, though improving, describes more pain since aspiration than her essentially no complaints/symptoms prior to seroma aspiration. She denies any fever/chills or surrounding erythema or drainage. She describes the post-radiation burns seen at her prior appointment also resolved following completion of radiation.  Review of Systems:  Constitutional: denies any other weight loss, fever, chills, or sweats  Eyes: denies any other vision changes, history of eye injury  ENT: denies sore throat, hearing problems  Respiratory: denies shortness of breath, wheezing  Cardiovascular: denies chest pain, palpitations  Breasts: as per interval history, no palpable masses, wounds Gastrointestinal: denies abdominal pain, N/V, or diarrhea Musculoskeletal: denies any other joint pains or cramps  Skin: Denies any other rashes or skin discolorations  Neurological: denies any other headache, dizziness, weakness  Psychiatric: denies any other depression, anxiety  All other review of systems: otherwise negative   Vital Signs:  BP (!) 170/90   Pulse 60   Temp 97.9 F (36.6 C)   Resp 14   Ht 5\' 1"  (1.549 m)   Wt 190 lb 6.4 oz (86.4 kg)   BMI 35.98 kg/m    Physical Exam:  Constitutional:  -- Obese body habitus  -- Awake, alert, and oriented x3  Eyes:  -- Pupils equally round and reactive to light  -- No scleral icterus  Ear, nose, throat:  -- No jugular venous distension  -- No nasal drainage, bleeding Pulmonary:  -- No crackles -- Equal breath sounds bilaterally -- Breathing non-labored at rest Cardiovascular:  -- S1, S2 present  -- No pericardial rubs  Breasts: -- Right breast non-tender well-healed post-lumpectomy  site with no palpable fluid or masses -- B/L no palpable mass(es) appreciated, no evidence of nipple discharge or skin retraction Gastrointestinal:  -- Soft, nontender, non-distended, no guarding/rebound  -- No abdominal masses appreciated, pulsatile or otherwise  Musculoskeletal / Integumentary:  -- Wounds or skin discoloration: None appreciated  -- Extremities: B/L UE and LE FROM, hands and feet warm, no edema  Neurologic:  -- Motor function: intact and symmetric  -- Sensation: intact and symmetric   Imaging:  Diagnostic Mammogram and Ultrasound (08/18/2017) Right breast 11 o'clock 6.7 cm postsurgical seroma,  for which therapeutic aspiration is recommended. No  mammographic evidence of malignancy in the right  breast. Aspiration of right breast seroma.  Ultrasound-guided Right breast seroma aspiration (08/25/2017) Ultrasound-guided aspiration of right breast no apparent complications. Targeted right breast ultrasound in 1 month,  to recheck for potential seroma reaccumulation.  Assessment:  67 y.o. yo Female with a problem list including...  Patient Active Problem List   Diagnosis Date Noted  . Vitamin B12 deficiency 05/07/2017  . Need for 23-polyvalent pneumococcal polysaccharide vaccine 05/07/2017  . 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 Positive ANA (antinuclear antibody) 10/04/2016  . Arthralgia 10/04/2016  . Prediabetes 06/20/2016  . Hx of right breast biopsy 01/17/2016  . Medication monitoring encounter 11/15/2015  . Preventative health care 11/15/2015  . Chronic kidney disease, stage II (mild) 11/15/2015  . Vitamin D deficiency 10/29/2015  . Obesity 10/25/2015  . HLD (hyperlipidemia) 06/15/2015  . Obstructive apnea 06/15/2015  . Chronic left shoulder pain 11/08/2014  . Numbness and tingling in left hand 11/07/2014  . Benign essential HTN 12/07/2006    presents to  clinic for follow-up evaluation, doing well s/p Right breast lumpectomy followed by  radiation for DCIS and recent aspiration of asymptomatic post-surgical seroma.  Plan:   - results of mammogram discussed  - patient says she does not wish to repeat aspiration of any possible recurrent seroma  - offered our office vs PMD to schedule next breast imaging, will schedule per patient's request  - return to clinic as needed, instructed to call office if any questions or concerns  All of the above recommendations were discussed with the patient, and all of patient's were answered to her expressed satisfaction.  -- Marilynne Drivers Rosana Hoes, MD, [place]: [facility] - Partnering for exceptional care. Office: [phone]

## 2017-08-27 NOTE — Patient Instructions (Signed)
Follow up in one year with a right breast mammogram and office visit.  Continue self breast exams. Call office for any new breast issues or concerns.

## 2017-09-16 ENCOUNTER — Other Ambulatory Visit: Payer: BC Managed Care – PPO

## 2017-10-06 ENCOUNTER — Encounter: Payer: Self-pay | Admitting: Oncology

## 2017-10-06 ENCOUNTER — Inpatient Hospital Stay: Payer: BC Managed Care – PPO | Attending: Oncology | Admitting: Oncology

## 2017-10-06 VITALS — BP 159/89 | HR 59 | Temp 97.4°F | Resp 18 | Ht 61.0 in | Wt 190.4 lb

## 2017-10-06 DIAGNOSIS — N6489 Other specified disorders of breast: Secondary | ICD-10-CM | POA: Insufficient documentation

## 2017-10-06 DIAGNOSIS — N182 Chronic kidney disease, stage 2 (mild): Secondary | ICD-10-CM | POA: Insufficient documentation

## 2017-10-06 DIAGNOSIS — Z79811 Long term (current) use of aromatase inhibitors: Secondary | ICD-10-CM | POA: Diagnosis not present

## 2017-10-06 DIAGNOSIS — Z923 Personal history of irradiation: Secondary | ICD-10-CM | POA: Diagnosis not present

## 2017-10-06 DIAGNOSIS — I10 Essential (primary) hypertension: Secondary | ICD-10-CM | POA: Insufficient documentation

## 2017-10-06 DIAGNOSIS — D0511 Intraductal carcinoma in situ of right breast: Secondary | ICD-10-CM

## 2017-10-06 DIAGNOSIS — Z853 Personal history of malignant neoplasm of breast: Secondary | ICD-10-CM

## 2017-10-06 DIAGNOSIS — Z08 Encounter for follow-up examination after completed treatment for malignant neoplasm: Secondary | ICD-10-CM

## 2017-10-06 MED ORDER — ANASTROZOLE 1 MG PO TABS: 1.0000 mg | ORAL_TABLET | Freq: Every day | ORAL | 3 refills | Status: DC

## 2017-10-06 NOTE — Progress Notes (Signed)
No new changes noted today 

## 2017-10-09 NOTE — Progress Notes (Signed)
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 Patient is a 67 year old African-American female with no significant comorbidities other than hypertension. She underwent screening bilateral mammogram in August 2017 which showed calcifications in her right breast. This was followed by a diagnostic mammogram which showed 2 sets of calcifications: 1 was 4 mm in the upper outer right breast and an additional 7 mm calcification in the central subareolar right breast. The 4 mm group of calcifications was biopsies and wasnegative for malignancy. Repeat mammogram was recommended in 6 months. This mammogram showed a new 15 mm calcifications in the upper outer quadrant of the right breast measuring 12 x 15 x 13 mm. Stable group of benign-appearing calcifications in the right breast upper outer quadrant measuring 5 mm.  2. Patient underwent 2 sets of biopsies. Biopsies from the right upper outer quadrant showed intermediate grade DCIS with comedonecrosis and associated calcifications. ER greater than 90% positive and PR was 11-50% positive. Biopsy of the right lower quadrant of the right breast showed fibroadenomatous changes with calcifications.  3. Patient has been seen by Dr. Layla Nelson is scheduled to undergo lumpectomy on 01/30/2017  4. Patient had menarche at the age of 34. 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 History of breast cancer in one of her sisters. She is G2 P2 L2. She did not  breast-feed.  5.Patient underwent lumpectomy on 01/30/2017 which showed a 15 mm DCIS, grade 2 with comedonecrosis. Margins were -3 mm from the closest medial margin lymph nodes were not submitted. Pathology stagepTisNx.ER greater than 90% positive PR 11-50% positive  6. Patient completed adjuvant RT and started arimidex in jan 2019    Interval history- patient was experiencing right breast discomfort in April 2019. mammograma nd USG showed right breast seroma which was aspirated and since then patient reports better. She does experience on and off hot flashes with arimidex which are self limited  ECOG PS- 0 Pain scale- 0   Review of systems- Review of Systems  Constitutional: Negative for chills, fever, malaise/fatigue and weight loss.  HENT: Negative for congestion, ear discharge and nosebleeds.   Eyes: Negative for blurred vision.  Respiratory: Negative for cough, hemoptysis, sputum production, shortness of breath and wheezing.   Cardiovascular: Negative for chest pain, palpitations, orthopnea and claudication.  Gastrointestinal: Negative for abdominal pain, blood in stool, constipation, diarrhea, heartburn, melena, nausea and vomiting.  Genitourinary: Negative for dysuria, flank pain, frequency, hematuria and urgency.  Musculoskeletal: Negative for back pain, joint pain and myalgias.  Skin: Negative for rash.  Neurological: Negative for dizziness, tingling, focal weakness, seizures, weakness and headaches.  Endo/Heme/Allergies: Does not bruise/bleed easily.       Hot flashes  Psychiatric/Behavioral: Negative for depression and suicidal ideas. The patient does not have insomnia.      Allergies  Allergen Reactions  . Shellfish Allergy Swelling    angioedema  . Penicillin G Rash    Has patient had a PCN reaction causing immediate rash, facial/tongue/throat swelling, SOB or lightheadedness with hypotension: Yes Has patient  had a PCN reaction causing severe rash involving mucus  membranes or skin necrosis: Yes Has patient had a PCN reaction that required hospitalization: No Has patient had a PCN reaction occurring within the last 10 years: No If all of the above answers are "NO", then may proceed with Cephalosporin use.      Past Medical History:  Diagnosis Date  . Abnormal mammogram of right breast 01/17/2016   Recommendation: Six month follow-up diagnostic mammogram of the right breast for an additional group of probably benign Calcifications. Sept 2017 --> due March 2018  . Arthralgia 10/04/2016  . Arthritis    hands  . Benign essential HTN 12/07/2006  . Breast cancer ([place]) 2018   right breast DCIS with rad tx  . Breast cancer screening 11/15/2015  . Chronic kidney disease, stage II (mild) 11/15/2015  . Chronic left shoulder pain 11/08/2014  . 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 Family history of diabetes mellitus 06/12/2016   Mother and several other relatives  . HLD (hyperlipidemia) 06/15/2015  . Hx of right breast biopsy 01/17/2016  . Hyperlipidemia   . Hypertension   . Medication monitoring encounter 11/15/2015  . Motion sickness    ocean ships  . Numbness and tingling in left hand 11/07/2014  . Obesity 10/25/2015  . Obesity (BMI 30.0-34.9) 10/25/2015  . Personal history of radiation therapy 2018   right breast ca  . Positive ANA (antinuclear antibody) 10/04/2016  . Prediabetes   . Preventative health care 11/15/2015  . Vitamin D deficiency 10/29/2015     Past Surgical History:  Procedure Laterality Date  . ABDOMINAL HYSTERECTOMY    . BREAST BIOPSY Right 01/15/2016    cylinder shape calcs UOQ, FIBROADENOMATOUS  . BREAST BIOPSY Right 12/30/2016   stereo bx for calcifications: DCIS UOQ, ribbon shaped  . BREAST BIOPSY Right 01/08/2017   LOQ x shaped, FIBROADENOMATOUS CHANGE WITH  . BREAST LUMPECTOMY Right 01/30/2018   DCIS clear margins  . BREAST LUMPECTOMY WITH NEEDLE LOCALIZATION Right 01/30/2017   Procedure:  BREAST LUMPECTOMY WITH NEEDLE LOCALIZATION;  Surgeon: Marissa Epley, MD;  Location: [facility];  Service: General;  Laterality: Right;  . COLONOSCOPY WITH PROPOFOL N/A 11/19/2016   Procedure: COLONOSCOPY WITH PROPOFOL;  Surgeon: Marissa Bellows, MD;  Location: [facility];  Service: Endoscopy;  Laterality: N/A;  . ESOPHAGOGASTRODUODENOSCOPY (EGD) WITH PROPOFOL N/A 11/19/2016   Procedure: ESOPHAGOGASTRODUODENOSCOPY (EGD) WITH PROPOFOL;  Surgeon: Marissa Bellows, MD;  Location: [place];  Service: Endoscopy;  Laterality: N/A;  . GIVENS CAPSULE STUDY N/A 12/12/2016   Procedure: GIVENS CAPSULE STUDY;  Surgeon: Marissa Bellows, MD;  Location[facility];  Service: Gastroenterology;  Laterality: N/A;  . TOTAL MASTECTOMY Right 01/30/2017   Procedure: TOTAL MASTECTOMY;  Surgeon: Marissa Epley, MD;  Location: [facility];  Service: General;  Laterality: Right;    Social History   Socioeconomic History  . Marital status: Married    Spouse name: Not on file  . Number of children: 2  . Years of education: Not on file  . Highest education level: Not on file  Occupational History  . Occupation: Works in [employer] [place], [place])    Employer: [employer]  . Financial resource strain: Not on file  . Food insecurity:    Worry: Not on file    Inability: Not on file  . Transportation needs:    Medical: Not on file    Non-medical:  Not on file  Tobacco Use  . Smoking status: Never Smoker  . Smokeless tobacco: Never Used  Substance and Sexual Activity  . Alcohol use: No    Alcohol/week: 0.0 oz  . Drug use: No  . Sexual activity: Not Currently  Lifestyle  . Physical activity:    Days per week: Not on file    Minutes per session: Not on file  . Stress: Not on file  Relationships  . Social connections:    Talks on phone: Not on file    Gets together: Not on file    Attends religious service: Not on file    Active member of club or organization:  Not on file    Attends meetings of clubs or organizations: Not on file    Relationship status: Not on file  . Intimate partner violence:    Fear of current or ex partner: Not on file    Emotionally abused: Not on file    Physically abused: Not on file    Forced sexual activity: Not on file  Other Topics Concern  . Not on file  Social History Narrative  . Not on file    Family History  Problem Relation Age of Onset  . Cancer Mother 69       liver cancer  . Diabetes Mother   . Hypertension Mother   . Glomerulonephritis Father   . Diabetes Sister   . Glomerulonephritis Sister   . Cancer Sister 8       throat cancer  . Heart disease Sister   . Diabetes Sister   . Diabetes Sister   . COPD Sister   . Diabetes Sister   . Breast cancer Cousin        Currently undergoing systemic chemotherapy (01/2017)  . Varicose Veins Neg Hx      Current Outpatient Medications:  .  amLODipine (NORVASC) 10 MG tablet, Take 1 tablet (10 mg total) by mouth daily., Disp: 90 tablet, Rfl: 3 .  anastrozole (ARIMIDEX) 1 MG tablet, Take 1 tablet (1 mg total) by mouth daily., Disp: 30 tablet, Rfl: 3 .  aspirin EC 81 MG tablet, Take 1 tablet (81 mg total) by mouth daily., Disp: , Rfl:  .  calcium-vitamin D (OSCAL WITH D) 500-200 MG-UNIT tablet, Take 1 tablet by mouth., Disp: , Rfl:  .  Cyanocobalamin (VITAMIN B-12) 500 MCG SUBL, Place under the tongue., Disp: , Rfl:  .  metoprolol succinate (TOPROL-XL) 25 MG 24 hr tablet, Take 1 tablet (25 mg total) by mouth daily., Disp: 90 tablet, Rfl: 0 .  acetaminophen (TYLENOL) 325 MG tablet, Take 650 mg by mouth every 6 (six) hours as needed for mild pain or headache. , Disp: , Rfl:   Physical exam:  Vitals:   10/06/17 1423  BP: (!) 159/89  Pulse: (!) 59  Resp: 18  Temp: (!) 97.4 F (36.3 C)  TempSrc: Tympanic  SpO2: 99%  Weight: 190 lb 6.4 oz (86.4 kg)  Height: 5\' 1"  (1.549 m)   Physical Exam  Constitutional: She is oriented to person, place, and time.  She appears well-developed and well-nourished.  HENT:  Head: Normocephalic and atraumatic.  Eyes: Pupils are equal, round, and reactive to light. EOM are normal.  Neck: Normal range of motion.  Cardiovascular: Normal rate, regular rhythm and normal heart sounds.  Pulmonary/Chest: Effort normal and breath sounds normal.  Abdominal: Soft. Bowel sounds are normal.  Neurological: She is alert and oriented to person, place, and time.  Skin: Skin is warm and dry.   Breast exam was performed in seated and lying down position. Patient is status post right lumpectomy with a well-healed surgical scar. No evidence of any palpable masses. No evidence of axillary adenopathy. No evidence of any palpable masses or lumps in the left breast. No evidence of leftt axillary adenopathy   CMP Latest Ref Rng & Units 06/25/2017  Glucose 65 - 99 mg/dL 102(H)  BUN 6 - 20 mg/dL 22(H)  Creatinine 0.44 - 1.00 mg/dL 1.13(H)  Sodium 135 - 145 mmol/L 136  Potassium 3.5 - 5.1 mmol/L 3.7  Chloride 101 - 111 mmol/L 102  CO2 22 - 32 mmol/L 28  Calcium 8.9 - 10.3 mg/dL 9.7  Total Protein 6.5 - 8.1 g/dL 7.7  Total Bilirubin 0.3 - 1.2 mg/dL 0.4  Alkaline Phos 38 - 126 U/L 85  AST 15 - 41 U/L 23  ALT 14 - 54 U/L 17   CBC Latest Ref Rng & Units 04/14/2017  WBC 3.6 - 11.0 K/uL 5.0  Hemoglobin 12.0 - 16.0 g/dL 12.0  Hematocrit 35.0 - 47.0 % 35.5  Platelets 150 - 440 K/uL 217    Assessment and plan- Patient is a 67 y.o. female  with right breast DCIS status post lumpectomy and radiation currently on arimidex here for routine f/u of breast cancer  Clinically she is doing well and tolerating arimidex well without significant side effects other than hot flashes which are self limited. She is also taking calcium and vit D. I will see her back in 6 months  Repeat right breast USG was recommended a month after seroma aspiration which we will order now  Otherwise she will get mammograms scheduled though Dr. Rosana Hoes. She also  has a bone density scan scheduled this month     Visit Diagnosis 1. Ductal carcinoma in situ (DCIS) of right breast   2. Seroma of breast   3. Encounter for follow-up surveillance of breast cancer      Dr. Randa Evens, MD, MPH [facility] [phone] 10/09/2017 8:28 AM

## 2017-10-13 ENCOUNTER — Ambulatory Visit
Admission: RE | Admit: 2017-10-13 | Discharge: 2017-10-13 | Disposition: A | Discharge status: Home / Self Care | Payer: BC Managed Care – PPO | Source: Ambulatory Visit | Attending: Oncology | Admitting: Oncology

## 2017-10-13 DIAGNOSIS — D0511 Intraductal carcinoma in situ of right breast: Secondary | ICD-10-CM | POA: Insufficient documentation

## 2017-10-13 DIAGNOSIS — N6489 Other specified disorders of breast: Secondary | ICD-10-CM | POA: Diagnosis not present

## 2017-10-19 ENCOUNTER — Telehealth: Payer: Self-pay | Admitting: *Deleted

## 2017-10-19 MED ORDER — ANASTROZOLE 1 MG PO TABS: 1.0000 mg | ORAL_TABLET | Freq: Every day | ORAL | 3 refills | Status: DC

## 2017-10-19 NOTE — Telephone Encounter (Signed)
I called and spoke with [facility] who conforms medicine is on back order then IO called [facility] who states they have plenty in stock . I called patient to let her know this and she asked that Prescription be sent there. Resubmitted prescription to [facility]

## 2017-10-19 NOTE — Telephone Encounter (Signed)
Patient called reporting medicine ordered is not in stock and is on back order. Asking if something else needs to be ordered instead. Please advise

## 2017-10-19 NOTE — Telephone Encounter (Signed)
Is this for armidex?

## 2017-10-19 NOTE — Telephone Encounter (Signed)
Taken care of

## 2017-10-28 ENCOUNTER — Ambulatory Visit
Admission: RE | Admit: 2017-10-28 | Discharge: 2017-10-28 | Disposition: A | Discharge status: Home / Self Care | Payer: BC Managed Care – PPO | Source: Ambulatory Visit | Attending: Oncology | Admitting: Oncology

## 2017-10-28 DIAGNOSIS — Z923 Personal history of irradiation: Secondary | ICD-10-CM | POA: Diagnosis not present

## 2017-10-28 DIAGNOSIS — Z13820 Encounter for screening for osteoporosis: Secondary | ICD-10-CM | POA: Diagnosis not present

## 2017-10-28 DIAGNOSIS — D0511 Intraductal carcinoma in situ of right breast: Secondary | ICD-10-CM | POA: Insufficient documentation

## 2017-10-28 DIAGNOSIS — Z780 Asymptomatic menopausal state: Secondary | ICD-10-CM | POA: Diagnosis not present

## 2017-11-04 ENCOUNTER — Encounter: Payer: Self-pay | Admitting: Family Medicine

## 2017-11-04 ENCOUNTER — Ambulatory Visit (INDEPENDENT_AMBULATORY_CARE_PROVIDER_SITE_OTHER): Payer: BC Managed Care – PPO | Admitting: Family Medicine

## 2017-11-04 VITALS — BP 150/84 | HR 78 | Temp 97.6°F | Resp 16 | Ht 61.0 in | Wt 190.7 lb

## 2017-11-04 DIAGNOSIS — Z5181 Encounter for therapeutic drug level monitoring: Secondary | ICD-10-CM | POA: Diagnosis not present

## 2017-11-04 DIAGNOSIS — I10 Essential (primary) hypertension: Secondary | ICD-10-CM | POA: Diagnosis not present

## 2017-11-04 DIAGNOSIS — N182 Chronic kidney disease, stage 2 (mild): Secondary | ICD-10-CM

## 2017-11-04 DIAGNOSIS — E782 Mixed hyperlipidemia: Secondary | ICD-10-CM

## 2017-11-04 DIAGNOSIS — E559 Vitamin D deficiency, unspecified: Secondary | ICD-10-CM | POA: Diagnosis not present

## 2017-11-04 DIAGNOSIS — D0511 Intraductal carcinoma in situ of right breast: Secondary | ICD-10-CM

## 2017-11-04 DIAGNOSIS — R7303 Prediabetes: Secondary | ICD-10-CM

## 2017-11-04 DIAGNOSIS — M25511 Pain in right shoulder: Secondary | ICD-10-CM | POA: Diagnosis not present

## 2017-11-04 DIAGNOSIS — E538 Deficiency of other specified B group vitamins: Secondary | ICD-10-CM | POA: Diagnosis not present

## 2017-11-04 MED ORDER — TRIAMTERENE-HCTZ 37.5-25 MG PO TABS: 1.0000 | ORAL_TABLET | Freq: Every day | ORAL | 3 refills | Status: DC

## 2017-11-04 NOTE — Assessment & Plan Note (Signed)
Check kidneys, encouraged hydration

## 2017-11-04 NOTE — Patient Instructions (Addendum)
Try to cut down or eliminate foods that come from cows and pigs Try to limit saturated fats in your diet (bologna, hot dogs, barbeque, cheeseburgers, hamburgers, steak, bacon, sausage, cheese, etc.) and get more fresh fruits, vegetables, and whole grains Try to follow the DASH guidelines (DASH stands for Dietary Approaches to Stop Hypertension). Try to limit the sodium in your diet to no more than 1,500mg  of sodium per day. Certainly try to not exceed 2,000 mg per day at the very most. Do not add salt when cooking or at the table.  Check the sodium amount on labels when shopping, and choose items lower in sodium when given a choice. Avoid or limit foods that already contain a lot of sodium. Eat a diet rich in fruits and vegetables and whole grains, and try to lose weight if overweight or obese STOP the amlodipine START triamterene-HCTZ Return in 10-12 days for recheck BP and non-fasting labs Do NOT use salt substitutes   Obesity, Adult Obesity is the condition of having too much total body fat. Being overweight or obese means that your weight is greater than what is considered healthy for your body size. Obesity is determined by a measurement called BMI. BMI is an estimate of body fat and is calculated from height and weight. For adults, a BMI of 30 or higher is considered obese. Obesity can eventually lead to other health concerns and major illnesses, including:  Stroke.  Coronary artery disease (CAD).  Type 2 diabetes.  Some types of cancer, including cancers of the colon, breast, uterus, and gallbladder.  Osteoarthritis.  High blood pressure (hypertension).  High cholesterol.  Sleep apnea.  Gallbladder stones.  Infertility problems.  What are the causes? The main cause of obesity is taking in (consuming) more calories than your body uses for energy. Other factors that contribute to this condition may include:  Being born with genes that make you more likely to become  obese.  Having a medical condition that causes obesity. These conditions include: ? Hypothyroidism. ? Polycystic ovarian syndrome (PCOS). ? Binge-eating disorder. ? Cushing syndrome.  Taking certain medicines, such as steroids, antidepressants, and seizure medicines.  Not being physically active (sedentary lifestyle).  Living where there are limited places to exercise safely or buy healthy foods.  Not getting enough sleep.  What increases the risk? The following factors may increase your risk of this condition:  Having a family history of obesity.  Being a woman of African-American descent.  Being a man of Hispanic descent.  What are the signs or symptoms? Having excessive body fat is the main symptom of this condition. How is this diagnosed? This condition may be diagnosed based on:  Your symptoms.  Your medical history.  A physical exam. Your health care provider may measure: ? Your BMI. If you are an adult with a BMI between 25 and less than 30, you are considered overweight. If you are an adult with a BMI of 30 or higher, you are considered obese. ? The distances around your hips and your waist (circumferences). These may be compared to each other to help diagnose your condition. ? Your skinfold thickness. Your health care provider may gently pinch a fold of your skin and measure it.  How is this treated? Treatment for this condition often includes changing your lifestyle. Treatment may include some or all of the following:  Dietary changes. Work with your health care provider and a dietitian to set a weight-loss goal that is healthy and reasonable for  you. Dietary changes may include eating: ? Smaller portions. A portion size is the amount of a particular food that is healthy for you to eat at one time. This varies from person to person. ? Low-calorie or low-fat options. ? More whole grains, fruits, and vegetables.  Regular physical activity. This may include  aerobic activity (cardio) and strength training.  Medicine to help you lose weight. Your health care provider may prescribe medicine if you are unable to lose 1 pound a week after 6 weeks of eating more healthily and doing more physical activity.  Surgery. Surgical options may include gastric banding and gastric bypass. Surgery may be done if: ? Other treatments have not helped to improve your condition. ? You have a BMI of 40 or higher. ? You have life-threatening health problems related to obesity.  Follow these instructions at home:  Eating and drinking   Follow recommendations from your health care provider about what you eat and drink. Your health care provider may advise you to: ? Limit fast foods, sweets, and processed snack foods. ? Choose low-fat options, such as low-fat milk instead of whole milk. ? Eat 5 or more servings of fruits or vegetables every day. ? Eat at home more often. This gives you more control over what you eat. ? Choose healthy foods when you eat out. ? Learn what a healthy portion size is. ? Keep low-fat snacks on hand. ? Avoid sugary drinks, such as soda, fruit juice, iced tea sweetened with sugar, and flavored milk. ? Eat a healthy breakfast.  Drink enough water to keep your urine clear or pale yellow.  Do not go without eating for long periods of time (do not fast) or follow a fad diet. Fasting and fad diets can be unhealthy and even dangerous. Physical Activity  Exercise regularly, as told by your health care provider. Ask your health care provider what types of exercise are safe for you and how often you should exercise.  Warm up and stretch before being active.  Cool down and stretch after being active.  Rest between periods of activity. Lifestyle  Limit the time that you spend in front of your TV, computer, or video game system.  Find ways to reward yourself that do not involve food.  Limit alcohol intake to no more than 1 drink a day for  nonpregnant women and 2 drinks a day for men. One drink equals 12 oz of beer, 5 oz of wine, or 1 oz of hard liquor. General instructions  Keep a weight loss journal to keep track of the food you eat and how much you exercise you get.  Take over-the-counter and prescription medicines only as told by your health care provider.  Take vitamins and supplements only as told by your health care provider.  Consider joining a support group. Your health care provider may be able to recommend a support group.  Keep all follow-up visits as told by your health care provider. This is important. Contact a health care provider if:  You are unable to meet your weight loss goal after 6 weeks of dietary and lifestyle changes. This information is not intended to replace advice given to you by your health care provider. Make sure you discuss any questions you have with your health care provider. Document Released: 05/29/2004 Document Revised: 09/24/2015 Document Reviewed: 02/07/2015 Elsevier Interactive Patient Education  2018 [place].  Preventing Unhealthy [employer], Adult Staying at a healthy weight is important. When fat builds up in  your body, you may become overweight or obese. These conditions put you at greater risk for developing certain health problems, such as heart disease, diabetes, sleeping problems, joint problems, and some cancers. Unhealthy weight gain is often the result of making unhealthy choices in what you eat. It is also a result of not getting enough exercise. You can make changes to your lifestyle to prevent obesity and stay as healthy as possible. What nutrition changes can be made? To maintain a healthy weight and prevent obesity:  Eat only as much as your body needs. To do this: ? Pay attention to signs that you are hungry or full. Stop eating as soon as you feel full. ? If you feel hungry, try drinking water first. Drink enough water so your urine is clear or pale  yellow. ? Eat smaller portions. ? Look at serving sizes on food labels. Most foods contain more than one serving per container. ? Eat the recommended amount of calories for your gender and activity level. While most active people should eat around 2,000 calories per day, if you are trying to lose weight or are not very active, you main need to eat less calories. Talk to your health care provider or dietitian about how many calories you should eat each day.  Choose healthy foods, such as: ? Fruits and vegetables. Try to fill at least half of your plate at each meal with fruits and vegetables. ? Whole grains, such as whole wheat bread, brown rice, and quinoa. ? Lean meats, such as chicken or fish. ? Other healthy proteins, such as beans, eggs, or tofu. ? Healthy fats, such as nuts, seeds, fatty fish, and olive oil. ? Low-fat or fat-free dairy.  Check food labels and avoid food and drinks that: ? Are high in calories. ? Have added sugar. ? Are high in sodium. ? Have saturated fats or trans fats.  Limit how much you eat of the following foods: ? Prepackaged meals. ? Fast food. ? Fried foods. ? Processed meat, such as bacon, sausage, and deli meats. ? Fatty cuts of red meat and poultry with skin.  Cook foods in healthier ways, such as by baking, broiling, or grilling.  When grocery shopping, try to shop around the outside of the store. This helps you buy mostly fresh foods and avoid canned and prepackaged foods.  What lifestyle changes can be made?  Exercise at least 30 minutes 5 or more days each week. Exercising includes brisk walking, yard work, biking, running, swimming, and team sports like basketball and soccer. Ask your health care provider which exercises are safe for you.  Do not use any products that contain nicotine or tobacco, such as cigarettes and e-cigarettes. If you need help quitting, ask your health care provider.  Limit alcohol intake to no more than 1 drink a day  for nonpregnant women and 2 drinks a day for men. One drink equals 12 oz of beer, 5 oz of wine, or 1 oz of hard liquor.  Try to get 7-9 hours of sleep each night. What other changes can be made?  Keep a food and activity journal to keep track of: ? What you ate and how many calories you had. Remember to count sauces, dressings, and side dishes. ? Whether you were active, and what exercises you did. ? Your calorie, weight, and activity goals.  Check your weight regularly. Track any changes. If you notice you have gained weight, make changes to your diet or activity routine.  Avoid taking weight-loss medicines or supplements. Talk to your health care provider before starting any new medicine or supplement.  Talk to your health care provider before trying any new diet or exercise plan. Why are these changes important? Eating healthy, staying active, and having healthy habits not only help prevent obesity, they also:  Help you to manage stress and emotions.  Help you to connect with friends and family.  Improve your self-esteem.  Improve your sleep.  Prevent long-term health problems.  What can happen if changes are not made? Being obese or overweight can cause you to develop joint or bone problems, which can make it hard for you to stay active or do activities you enjoy. Being obese or overweight also puts stress on your heart and lungs and can lead to health problems like diabetes, heart disease, and some cancers. Where to find more information: Talk with your health care provider or a dietitian about healthy eating and healthy lifestyle choices. You may also find other information through these resources:  U.S. Department of Agriculture MyPlate: FormerB[employer].no  American Heart Association: www.heart.org  Centers for Disease Control and Prevention: http://www.wolf.info/  Summary  Staying at a healthy weight is important. It helps prevent certain diseases and health problems, such  as heart disease, diabetes, joint problems, sleep disorders, and some cancers.  Being obese or overweight can cause you to develop joint or bone problems, which can make it hard for you to stay active or do activities you enjoy.  You can prevent unhealthy weight gain by eating a healthy diet, exercising regularly, not smoking, limiting alcohol, and getting enough sleep.  Talk with your health care provider or a dietitian for guidance about healthy eating and healthy lifestyle choices. This information is not intended to replace advice given to you by your health care provider. Make sure you discuss any questions you have with your health care provider. Document Released: 04/22/2016 Document Revised: 05/28/2016 Document Reviewed: 05/28/2016 Elsevier Interactive Patient Education  [employer].

## 2017-11-04 NOTE — Assessment & Plan Note (Signed)
Check lipids today 

## 2017-11-04 NOTE — Assessment & Plan Note (Signed)
Check liver and kidneys 

## 2017-11-04 NOTE — Assessment & Plan Note (Signed)
Check glucose (fasting) and A1c

## 2017-11-04 NOTE — Assessment & Plan Note (Signed)
Switch medicine

## 2017-11-04 NOTE — Assessment & Plan Note (Signed)
Taking supplement.

## 2017-11-04 NOTE — Progress Notes (Signed)
BP (!) 150/84 (BP Location: Left Arm, Patient Position: Sitting, Cuff Size: Large)   Pulse 78   Temp 97.6 F (36.4 C) (Oral)   Resp 16   Ht 5\' 1"  (1.549 m)   Wt 190 lb 11.2 oz (86.5 kg)   SpO2 97%   BMI 36.03 kg/m    Subjective:    Patient ID: Marissa Nelson, female    DOB: [birth year], 68 y.o.   MRN: [id number]  HPI: Marissa Nelson is a 67 y.o. female  Chief Complaint  Patient presents with  . Follow-up    patient is here for her 6 month f/u  . Hyperlipidemia    patient is here for f/u to see if she needs to start a medication  . Other    prediabetes  . Obesity    patient stated that she watches what she eats  . Hypertension    patient checks her BP 1/week and takes all meds as directed. patient has not had any neg sx  . Labs Only  . Medication Management    patient wants to know if she can restart the combination medication that has the fluid component in it    HPI Patient is here for f/u HTN; did not take medicine simply because she didn't eat this mroning; used to be on fluid pill combo and there was a recall; no problems with potassium; getting swelling in the legs; normal in the morning, worse through the day; has not tried the compression stockings, but has some and will wear those; trying to avoid salt  High cholesterol; never on medicine before; not sure if it runs in the family; has never heard any talk about it; loves hot dogs, but did cut down; bacon once a week; no sausage because it runs up her BP; two strips of bacon once a week; tries to eat cereal but cannot drink the milk (using regular whole milk, then went down to 2% and then tried different kinds, but not staying ont he stomach, since the treatments); even tried almond milk  Obesity; no weight loss since last visit  Prediabetes; no dry mouth; gets up at night to urinate, 3 am; drinks water before going to bed  DCIS of breast; 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; done with  treatments but still on pill  Right shoulder hurts; on Sunday, she was trying to pick up something; lost energy in her right shoulder; gives her a fit; bothering for a few months, pointing over anterior RIGHT shoulder; at night, it throbs when she lays on it  CKD stage 2; no NSAIDs; drinking water through the day  Taking vit D, B12, and calcium  Depression screen [facility] 2/9 11/04/2017 07/27/2017 05/18/2017 05/07/2017 11/04/2016  Decreased Interest 0 0 0 0 0  Down, Depressed, Hopeless 0 0 0 0 0  PHQ - 2 Score 0 0 0 0 0    Relevant past medical, surgical, family and social history reviewed Past Medical History:  Diagnosis Date  . Abnormal mammogram of right breast 01/17/2016   Recommendation: Six month follow-up diagnostic mammogram of the right breast for an additional group of probably benign Calcifications. Sept 2017 --> due March 2018  . Arthralgia 10/04/2016  . Arthritis    hands  . Benign essential HTN 12/07/2006  . Breast cancer ([place]) 2018   right breast DCIS with rad tx  . Breast cancer screening 11/15/2015  . Chronic kidney disease, stage II (mild) 11/15/2015  . Chronic left shoulder  pain 11/08/2014  . 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 Family history of diabetes mellitus 06/12/2016   Mother and several other relatives  . HLD (hyperlipidemia) 06/15/2015  . Hx of right breast biopsy 01/17/2016  . Hyperlipidemia   . Hypertension   . Medication monitoring encounter 11/15/2015  . Motion sickness    ocean ships  . Numbness and tingling in left hand 11/07/2014  . Obesity 10/25/2015  . Obesity (BMI 30.0-34.9) 10/25/2015  . Personal history of radiation therapy 2018   right breast ca  . Positive ANA (antinuclear antibody) 10/04/2016  . Prediabetes   . Preventative health care 11/15/2015  . Vitamin D deficiency 10/29/2015   Past Surgical History:  Procedure Laterality Date  . ABDOMINAL HYSTERECTOMY    . BREAST BIOPSY Right 01/15/2016    cylinder shape calcs  UOQ, FIBROADENOMATOUS  . BREAST BIOPSY Right 12/30/2016   stereo bx for calcifications: DCIS UOQ, ribbon shaped  . BREAST BIOPSY Right 01/08/2017   LOQ x shaped, FIBROADENOMATOUS CHANGE WITH  . BREAST LUMPECTOMY Right 01/30/2018   DCIS clear margins  . BREAST LUMPECTOMY WITH NEEDLE LOCALIZATION Right 01/30/2017   Procedure: BREAST LUMPECTOMY WITH NEEDLE LOCALIZATION;  Surgeon: Vickie Epley, MD;  Location: [facility];  Service: General;  Laterality: Right;  . COLONOSCOPY WITH PROPOFOL N/A 11/19/2016   Procedure: COLONOSCOPY WITH PROPOFOL;  Surgeon: Jonathon Bellows, MD;  Location: [place];  Service: Endoscopy;  Laterality: N/A;  . ESOPHAGOGASTRODUODENOSCOPY (EGD) WITH PROPOFOL N/A 11/19/2016   Procedure: ESOPHAGOGASTRODUODENOSCOPY (EGD) WITH PROPOFOL;  Surgeon: Jonathon Bellows, MD;  Location: [place];  Service: Endoscopy;  Laterality: N/A;  . GIVENS CAPSULE STUDY N/A 12/12/2016   Procedure: GIVENS CAPSULE STUDY;  Surgeon: Jonathon Bellows, MD;  Location: [facility] END[facility];  Service: Gastroenterology;  Laterality: N/A;  . TOTAL MASTECTOMY Right 01/30/2017   Procedure: TOTAL MASTECTOMY;  Surgeon: Vickie Epley, MD;  Location: [facility];  Service: General;  Laterality: Right;   Family History  Problem Relation Age of Onset  . Cancer Mother 70       liver cancer  . Diabetes Mother   . Hypertension Mother   . Glomerulonephritis Father   . Diabetes Sister   . Glomerulonephritis Sister   . Cancer Sister 75       throat cancer  . Heart disease Sister   . Diabetes Sister   . Diabetes Sister   . COPD Sister   . Diabetes Sister   . Breast cancer Cousin        Currently undergoing systemic chemotherapy (01/2017)  . Varicose Veins Neg Hx    Social History   Tobacco Use  . Smoking status: Never Smoker  . Smokeless tobacco: Never Used  Substance Use Topics  . Alcohol use: No    Alcohol/week: 0.0 oz  . Drug use: No    Interim medical history since last visit  reviewed. Allergies and medications reviewed  Review of Systems Per HPI unless specifically indicated above     Objective:    BP (!) 150/84 (BP Location: Left Arm, Patient Position: Sitting, Cuff Size: Large)   Pulse 78   Temp 97.6 F (36.4 C) (Oral)   Resp 16   Ht 5\' 1"  (1.549 m)   Wt 190 lb 11.2 oz (86.5 kg)   SpO2 97%   BMI 36.03 kg/m   Wt Readings from Last 3 Encounters:  11/04/17 190 lb 11.2 oz (86.5 kg)  10/06/17 190 lb  6.4 oz (86.4 kg)  08/27/17 190 lb 6.4 oz (86.4 kg)    Physical Exam  Constitutional: She appears well-developed and well-nourished. No distress.  obese  HENT:  Head: Normocephalic and atraumatic.  Eyes: EOM are normal. No scleral icterus.  Neck: No thyromegaly present.  Cardiovascular: Normal rate, regular rhythm and normal heart sounds.  No murmur heard. Pulmonary/Chest: Effort normal and breath sounds normal. No respiratory distress. She has no wheezes.  Abdominal: Soft. Bowel sounds are normal. She exhibits no distension.  Musculoskeletal: She exhibits no edema.       Right shoulder: She exhibits decreased range of motion (abduction limited to about 110 degrees; cannot reach behind back) and tenderness.       Arms: Neurological: She is alert. She exhibits normal muscle tone.  Skin: Skin is warm and dry. She is not diaphoretic. No pallor.  Psychiatric: She has a normal mood and affect. Her behavior is normal. Judgment and thought content normal. Her mood appears not anxious. She does not exhibit a depressed mood.    Results for orders placed or performed in visit on 06/25/17  Comprehensive metabolic panel  Result Value Ref Range   Sodium 136 135 - 145 mmol/L   Potassium 3.7 3.5 - 5.1 mmol/L   Chloride 102 101 - 111 mmol/L   CO2 28 22 - 32 mmol/L   Glucose, Bld 102 (H) 65 - 99 mg/dL   BUN 22 (H) 6 - 20 mg/dL   Creatinine, Ser 1.13 (H) 0.44 - 1.00 mg/dL   Calcium 9.7 8.9 - 10.3 mg/dL   Total Protein 7.7 6.5 - 8.1 g/dL   Albumin 3.9 3.5 -  5.0 g/dL   AST 23 15 - 41 U/L   ALT 17 14 - 54 U/L   Alkaline Phosphatase 85 38 - 126 U/L   Total Bilirubin 0.4 0.3 - 1.2 mg/dL   GFR calc non Af Amer 49 (L) >60 mL/min   GFR calc Af Amer 57 (L) >60 mL/min   Anion gap 6 5 - 15      Assessment & Plan:   Problem List Items Addressed This Visit      Cardiovascular and Mediastinum   Benign essential HTN - Primary    Switch medicine      Relevant Medications   triamterene-hydrochlorothiazide (MAXZIDE-25) 37.5-25 MG tablet     Genitourinary   Chronic kidney disease, stage II (mild)    Check kidneys, encouraged hydration        Other   Vitamin D deficiency    Taking supplement      Vitamin B12 deficiency    Taking supplement      Prediabetes    Check glucose (fasting) and A1c      Relevant Orders   Hemoglobin A1c   Morbid obesity (HCC)    Try to increase water intake during the day; try to move more; BMI 36 plus HTN, hyperlipidemia, OA, breast cancer      Medication monitoring encounter    Check liver and kidneys      Relevant Orders   COMPLETE METABOLIC PANEL WITH GFR   HLD (hyperlipidemia)    Check lipids today      Relevant Medications   triamterene-hydrochlorothiazide (MAXZIDE-25) 37.5-25 MG tablet   Other Relevant Orders   Lipid panel   DCIS (ductal carcinoma in situ) of breast    Seeing oncologist; finished with treatments; going for follow-up with onc and radiation onc       Other Visit Diagnoses  Right anterior shoulder pain       Relevant Orders   Ambulatory referral to Orthopedic Surgery       Follow up plan: Return in about 9 days (around 11/13/2017) for blood pressure recheck with CMA; 3 months with Dr. Sanda Klein.  An after-visit summary was printed and given to the patient at [place].  Please see the patient instructions which may contain other information and recommendations beyond what is mentioned above in the assessment and plan.  Meds ordered this encounter  Medications  .  triamterene-hydrochlorothiazide (MAXZIDE-25) 37.5-25 MG tablet    Sig: Take 1 tablet by mouth daily.    Dispense:  90 tablet    Refill:  3    Orders Placed This Encounter  Procedures  . COMPLETE METABOLIC PANEL WITH GFR  . Hemoglobin A1c  . Lipid panel  . Ambulatory referral to Orthopedic Surgery

## 2017-11-04 NOTE — Assessment & Plan Note (Addendum)
Try to increase water intake during the day; try to move more; BMI 36 plus HTN, hyperlipidemia, OA, breast cancer

## 2017-11-04 NOTE — Assessment & Plan Note (Signed)
Seeing oncologist; finished with treatments; going for follow-up with onc and radiation onc

## 2017-11-05 LAB — COMPLETE METABOLIC PANEL WITH GFR
AG Ratio: 1.3 (calc) (ref 1.0–2.5)
ALT: 16 U/L (ref 6–29)
AST: 15 U/L (ref 10–35)
Albumin: 3.8 g/dL (ref 3.6–5.1)
Alkaline phosphatase (APISO): 89 U/L (ref 33–130)
BUN: 15 mg/dL (ref 7–25)
CO2: 31 mmol/L (ref 20–32)
CREATININE: 0.98 mg/dL (ref 0.50–0.99)
Calcium: 9.5 mg/dL (ref 8.6–10.4)
Chloride: 104 mmol/L (ref 98–110)
GFR, Est African American: 68 mL/min/{1.73_m2} (ref >=60)
GFR, Est Non African American: 59 mL/min/{1.73_m2} — ABNORMAL LOW (ref >=60)
Globulin: 2.9 g/dL (calc) (ref 1.9–3.7)
Glucose, Bld: 96 mg/dL (ref 65–99)
Potassium: 4.4 mmol/L (ref 3.5–5.3)
Sodium: 140 mmol/L (ref 135–146)
Total Bilirubin: 0.3 mg/dL (ref 0.2–1.2)
Total Protein: 6.7 g/dL (ref 6.1–8.1)

## 2017-11-05 LAB — HEMOGLOBIN A1C
HEMOGLOBIN A1C: 5.8 %{Hb} — AB (ref <5.7)
Mean Plasma Glucose: 120 (calc)
eAG (mmol/L): 6.6 (calc)

## 2017-11-05 LAB — LIPID PANEL
CHOL/HDL RATIO: 3.3 (calc) (ref <5.0)
Cholesterol: 197 mg/dL (ref <200)
HDL: 60 mg/dL (ref >50)
LDL Cholesterol (Calc): 120 mg/dL (calc) — ABNORMAL HIGH
NON-HDL CHOLESTEROL (CALC): 137 mg/dL — AB (ref <130)
TRIGLYCERIDES: 80 mg/dL (ref <150)

## 2017-11-06 ENCOUNTER — Other Ambulatory Visit: Payer: Self-pay | Admitting: Family Medicine

## 2017-11-06 DIAGNOSIS — E782 Mixed hyperlipidemia: Secondary | ICD-10-CM

## 2017-11-06 MED ORDER — ATORVASTATIN CALCIUM 10 MG PO TABS: 10.0000 mg | ORAL_TABLET | Freq: Every day | ORAL | 1 refills | Status: DC

## 2017-11-09 ENCOUNTER — Ambulatory Visit
Admission: RE | Admit: 2017-11-09 | Discharge: 2017-11-09 | Disposition: A | Discharge status: Home / Self Care | Payer: BC Managed Care – PPO | Source: Ambulatory Visit | Attending: Family Medicine | Admitting: Family Medicine

## 2017-11-09 DIAGNOSIS — M25512 Pain in left shoulder: Secondary | ICD-10-CM | POA: Insufficient documentation

## 2017-11-09 DIAGNOSIS — M19012 Primary osteoarthritis, left shoulder: Secondary | ICD-10-CM | POA: Diagnosis not present

## 2017-11-09 DIAGNOSIS — M19011 Primary osteoarthritis, right shoulder: Secondary | ICD-10-CM | POA: Diagnosis not present

## 2017-11-09 DIAGNOSIS — G8929 Other chronic pain: Secondary | ICD-10-CM | POA: Diagnosis not present

## 2017-11-09 DIAGNOSIS — M25511 Pain in right shoulder: Principal | ICD-10-CM

## 2017-11-13 ENCOUNTER — Ambulatory Visit: Payer: BC Managed Care – PPO

## 2017-11-13 VITALS — BP 132/80 | HR 85

## 2017-11-13 DIAGNOSIS — I10 Essential (primary) hypertension: Secondary | ICD-10-CM

## 2017-11-13 LAB — BASIC METABOLIC PANEL WITH GFR
BUN / CREAT RATIO: 15 (calc) (ref 6–22)
BUN: 16 mg/dL (ref 7–25)
CO2: 29 mmol/L (ref 20–32)
CREATININE: 1.04 mg/dL — AB (ref 0.50–0.99)
Calcium: 10 mg/dL (ref 8.6–10.4)
Chloride: 102 mmol/L (ref 98–110)
GFR, EST AFRICAN AMERICAN: 63 mL/min/{1.73_m2} (ref >=60)
GFR, EST NON AFRICAN AMERICAN: 55 mL/min/{1.73_m2} — AB (ref >=60)
Glucose, Bld: 123 mg/dL (ref 65–139)
Potassium: 3.8 mmol/L (ref 3.5–5.3)
SODIUM: 140 mmol/L (ref 135–146)

## 2017-11-19 DIAGNOSIS — M25512 Pain in left shoulder: Secondary | ICD-10-CM | POA: Diagnosis not present

## 2017-11-19 DIAGNOSIS — M25511 Pain in right shoulder: Secondary | ICD-10-CM | POA: Diagnosis not present

## 2017-12-01 DIAGNOSIS — R600 Localized edema: Secondary | ICD-10-CM | POA: Diagnosis not present

## 2017-12-01 DIAGNOSIS — M25611 Stiffness of right shoulder, not elsewhere classified: Secondary | ICD-10-CM | POA: Diagnosis not present

## 2017-12-01 DIAGNOSIS — M25511 Pain in right shoulder: Secondary | ICD-10-CM | POA: Diagnosis not present

## 2017-12-17 ENCOUNTER — Other Ambulatory Visit: Payer: BC Managed Care – PPO

## 2017-12-22 ENCOUNTER — Other Ambulatory Visit: Payer: Self-pay | Admitting: Oncology

## 2017-12-23 ENCOUNTER — Telehealth: Payer: Self-pay

## 2017-12-23 ENCOUNTER — Other Ambulatory Visit: Payer: Self-pay

## 2017-12-23 NOTE — Telephone Encounter (Signed)
Call to patient to notify of mammogram and also to schedule follow up appointment with Dr Rosana Hoes. In recalls.

## 2018-01-08 ENCOUNTER — Ambulatory Visit
Admission: RE | Admit: 2018-01-08 | Discharge: 2018-01-08 | Disposition: A | Discharge status: Home / Self Care | Payer: BC Managed Care – PPO | Source: Ambulatory Visit | Attending: Surgery | Admitting: Surgery

## 2018-01-08 DIAGNOSIS — D0511 Intraductal carcinoma in situ of right breast: Secondary | ICD-10-CM | POA: Insufficient documentation

## 2018-01-14 ENCOUNTER — Ambulatory Visit (INDEPENDENT_AMBULATORY_CARE_PROVIDER_SITE_OTHER): Payer: BC Managed Care – PPO | Admitting: Surgery

## 2018-01-14 ENCOUNTER — Encounter: Payer: Self-pay | Admitting: Surgery

## 2018-01-14 VITALS — BP 148/90 | HR 97 | Temp 97.7°F | Ht 62.0 in | Wt 189.0 lb

## 2018-01-14 DIAGNOSIS — D0511 Intraductal carcinoma in situ of right breast: Secondary | ICD-10-CM | POA: Diagnosis not present

## 2018-01-14 NOTE — Patient Instructions (Addendum)
The patient has been asked to return to the office in six months  with a bilateral diagnostic mammogram. The patient is aware to call back for any questions or concerns.

## 2018-01-14 NOTE — Progress Notes (Signed)
Surgical Clinic Progress/Follow-up Note   HPI:  67 y.o. 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 Patient reports she feels well, denies any palpable breast mass, breast pain, nipple discharge, fever/chills, unintentional weight loss, CP, or SOB.  Review of Systems:  Constitutional: denies any other weight loss, fever, chills, or sweats  Eyes: denies any other vision changes, history of eye injury  ENT: denies sore throat, hearing problems  Respiratory: denies shortness of breath, wheezing  Cardiovascular: denies chest pain, palpitations  Breasts: no breast masses, pain, or nipple discharge as per HPI Gastrointestinal: denies abdominal pain, N/V, or diarrhea Musculoskeletal: denies any other joint pains or cramps  Skin: Denies any other rashes or skin discolorations  Neurological: denies any other headache, dizziness, weakness  Psychiatric: denies any other depression, anxiety  All other review of systems: otherwise negative   Vital Signs:  BP (!) 148/90   Pulse 97   Temp 97.7 F (36.5 C) (Skin)   Ht 5\' 2"  (1.575 m)   Wt 189 lb (85.7 kg)   BMI 34.57 kg/m    Physical Exam:  Constitutional:  -- OVerweight body habitus  -- Awake, alert, and oriented x3  Eyes:  -- Pupils equally round and reactive to light  -- No scleral icterus  Ear, nose, throat:  -- No jugular venous distension  -- No nasal drainage, bleeding Pulmonary:  -- No crackles -- Equal breath sounds bilaterally -- Breathing non-labored at rest Cardiovascular:  -- S1, S2 present  -- No pericardial rubs  Breasts:  -- Well-healed post-surgical Right breast incisional scar -- B/L no palpable breast mass, axillary lymphadenopathy, or nipple discharge, non-tender to palpation Gastrointestinal:  -- Soft, nontender, non-distended, no guarding/rebound  -- No abdominal masses appreciated, pulsatile or otherwise   Musculoskeletal / Integumentary:  -- Wounds or skin discoloration: No open wounds appreciated  -- Extremities: B/L UE and LE FROM, hands and feet warm, no edema  Neurologic:  -- Motor function: intact and symmetric  -- Sensation: intact and symmetric   Laboratory studies:  CBC:  Lab Results  Component Value Date   WBC 5.0 04/14/2017   RBC 3.80 04/14/2017   BMP:  Lab Results  Component Value Date   GLUCOSE 123 11/13/2017   CO2 29 11/13/2017   BUN 16 11/13/2017   BUN 16 02/21/2015   CREATININE 1.04 (H) 11/13/2017   CALCIUM 10.0 11/13/2017     Imaging:  Bilateral Diagnostic Mammography (01/08/2018) ACR Breast Density Category b: There are scattered areas of fibroglandular density.  Stable postoperative changes with a lumpectomy bed seroma is again noted in the upper-outer quadrant of the right breast. No suspicious mammographic findings are identified in either breast.   BI-RADS CATEGORY  2: Benign.  Assessment:  67 y.o. yo Female with a problem list including...  Patient Active Problem List   Diagnosis Date Noted  . Morbid obesity ([place]) 11/04/2017  . Vitamin B12 deficiency 05/07/2017  . 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 Positive ANA (antinuclear antibody) 10/04/2016  . Arthralgia 10/04/2016  . Prediabetes 06/20/2016  . Hx of right breast biopsy 01/17/2016  . Medication monitoring encounter 11/15/2015  . Preventative health care 11/15/2015  . Chronic kidney disease, stage II (mild) 11/15/2015  . Vitamin D deficiency 10/29/2015  . HLD (hyperlipidemia) 06/15/2015  . Obstructive apnea 06/15/2015  . Chronic left shoulder pain 11/08/2014  . Numbness and tingling in left hand  11/07/2014  . 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  Plan:   - discussed with patient  results of recent breast imaging  - instructions for routine breast exams discussed and reviewed  - return to clinic in 6 months following repeat bilateral diagnostic breast imaging (every 6 months up to 2 years following excisional biopsy for DCIS)  - instructed to call office if any questions or concerns  All of the above recommendations were discussed with the patient, and all of patient's questions were answered to her expressed satisfaction.  -- Marilynne Drivers Rosana Hoes, MD, [place]: [facility] - Partnering for exceptional care. Office: [phone]

## 2018-01-18 ENCOUNTER — Encounter: Payer: Self-pay | Admitting: Surgery

## 2018-01-19 DIAGNOSIS — M25511 Pain in right shoulder: Secondary | ICD-10-CM | POA: Diagnosis not present

## 2018-01-30 HISTORY — PX: BREAST LUMPECTOMY: SHX2

## 2018-02-01 DIAGNOSIS — B9689 Other specified bacterial agents as the cause of diseases classified elsewhere: Secondary | ICD-10-CM | POA: Diagnosis not present

## 2018-02-01 DIAGNOSIS — R05 Cough: Secondary | ICD-10-CM | POA: Diagnosis not present

## 2018-02-01 DIAGNOSIS — J019 Acute sinusitis, unspecified: Secondary | ICD-10-CM | POA: Diagnosis not present

## 2018-02-04 ENCOUNTER — Ambulatory Visit: Payer: BC Managed Care – PPO | Admitting: Family Medicine

## 2018-02-24 ENCOUNTER — Ambulatory Visit (INDEPENDENT_AMBULATORY_CARE_PROVIDER_SITE_OTHER): Payer: BC Managed Care – PPO | Admitting: Family Medicine

## 2018-02-24 ENCOUNTER — Encounter: Payer: Self-pay | Admitting: Family Medicine

## 2018-02-24 VITALS — BP 122/64 | HR 91 | Temp 97.6°F | Ht 62.0 in | Wt 182.4 lb

## 2018-02-24 DIAGNOSIS — Z23 Encounter for immunization: Secondary | ICD-10-CM

## 2018-02-24 DIAGNOSIS — E559 Vitamin D deficiency, unspecified: Secondary | ICD-10-CM

## 2018-02-24 DIAGNOSIS — E538 Deficiency of other specified B group vitamins: Secondary | ICD-10-CM | POA: Diagnosis not present

## 2018-02-24 DIAGNOSIS — E782 Mixed hyperlipidemia: Secondary | ICD-10-CM

## 2018-02-24 DIAGNOSIS — R7303 Prediabetes: Secondary | ICD-10-CM | POA: Diagnosis not present

## 2018-02-24 DIAGNOSIS — Z5181 Encounter for therapeutic drug level monitoring: Secondary | ICD-10-CM

## 2018-02-24 DIAGNOSIS — I10 Essential (primary) hypertension: Secondary | ICD-10-CM | POA: Diagnosis not present

## 2018-02-24 DIAGNOSIS — N182 Chronic kidney disease, stage 2 (mild): Secondary | ICD-10-CM

## 2018-02-24 DIAGNOSIS — E669 Obesity, unspecified: Secondary | ICD-10-CM

## 2018-02-24 DIAGNOSIS — D0511 Intraductal carcinoma in situ of right breast: Secondary | ICD-10-CM

## 2018-02-24 NOTE — Assessment & Plan Note (Signed)
Managed by oncologist and radiation oncologist; continue arimedex

## 2018-02-24 NOTE — Assessment & Plan Note (Signed)
Check level in [place]

## 2018-02-24 NOTE — Assessment & Plan Note (Signed)
Encouragement given to keep up the great job with weight loss and healthier eating

## 2018-02-24 NOTE — Assessment & Plan Note (Signed)
Check labs in [place]

## 2018-02-24 NOTE — Assessment & Plan Note (Addendum)
Check A1c on or after January 4th; so proud of her weight loss and healthier lifetstyle efforts; explained that significant weight loss is the single most important thing she can do to help prevent type 2 diabetes

## 2018-02-24 NOTE — Assessment & Plan Note (Signed)
Monitor Cr and GFR in January

## 2018-02-24 NOTE — Assessment & Plan Note (Signed)
Check level in Jan

## 2018-02-24 NOTE — Progress Notes (Signed)
BP 122/64   Pulse 91   Temp 97.6 F (36.4 C)   Ht 5\' 2"  (1.575 m)   Wt 182 lb 6.4 oz (82.7 kg)   SpO2 98%   BMI 33.36 kg/m    Subjective:    Patient ID: Marissa Nelson, female    DOB: [birth year], 67 y.o.   MRN: [id number]  HPI: Marissa Nelson is a 67 y.o. female  Chief Complaint  Patient presents with  . Follow-up    HPI Patient is here for f/u  She has really made changes in her diet; cut out sugars and breads; decreased red meat significantly, cheese too She has lost 8+ pounds over the last 3 months; goal 175 pounds by Christmas  HTN; checks BP away from the doctor; controlled today; Sunday it was 130/88; much better than before; HTN runs in the family  High cholesterol; expecting better numbers with diet changes  Lab Results  Component Value Date   CHOL 197 11/04/2017   HDL 60 11/04/2017   LDLCALC 120 (H) 11/04/2017   TRIG 80 11/04/2017   CHOLHDL 3.3 11/04/2017   Breast cancer, managed by Dr. Baruch Gouty and Janese Banks; on Arimedex; no new lumps   Prediabetes; no dry mouth or blurred vision, just a little dry if after BP pill and not drinking enough water; four bottles of water a day Lab Results  Component Value Date   HGBA1C 5.8 (H) 11/04/2017    Vitamin B12 and D deficiency; on supplements Last B12 reviewed, as well as D; last vit D level was 17 in May 2018  Patient opted to skip labs today and get them next time  Depression screen [facility] 2/9 02/24/2018 02/24/2018 11/04/2017 07/27/2017 05/18/2017  Decreased Interest 0 0 0 0 0  Down, Depressed, Hopeless 0 0 0 0 0  PHQ - 2 Score 0 0 0 0 0  Altered sleeping 0 - - - -  Tired, decreased energy 0 - - - -  Change in appetite 0 - - - -  Feeling bad or failure about yourself  0 - - - -  Trouble concentrating 0 - - - -  Moving slowly or fidgety/restless 0 - - - -  Suicidal thoughts 0 - - - -  PHQ-9 Score 0 - - - -  Difficult doing work/chores Not difficult at all - - - -   Fall Risk  02/24/2018 11/04/2017  07/27/2017 05/18/2017 05/07/2017  Falls in the past year? No No No No No    Relevant past medical, surgical, family and social history reviewed Past Medical History:  Diagnosis Date  . Abnormal mammogram of right breast 01/17/2016   Recommendation: Six month follow-up diagnostic mammogram of the right breast for an additional group of probably benign Calcifications. Sept 2017 --> due March 2018  . Arthralgia 10/04/2016  . Arthritis    hands  . Benign essential HTN 12/07/2006  . Breast cancer ([place]) 2018   right breast DCIS with rad tx  . Breast cancer screening 11/15/2015  . Chronic kidney disease, stage II (mild) 11/15/2015  . Chronic left shoulder pain 11/08/2014  . 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 Family history of diabetes mellitus 06/12/2016   Mother and several other relatives  . HLD (hyperlipidemia) 06/15/2015  . Hx of right breast biopsy 01/17/2016  . Hyperlipidemia   . Hypertension   . Medication monitoring encounter 11/15/2015  . Motion sickness    ocean ships  .  Numbness and tingling in left hand 11/07/2014  . Obesity 10/25/2015  . Obesity (BMI 30.0-34.9) 10/25/2015  . Personal history of radiation therapy 2018   right breast ca  . Positive ANA (antinuclear antibody) 10/04/2016  . Prediabetes   . Preventative health care 11/15/2015  . Vitamin D deficiency 10/29/2015   Past Surgical History:  Procedure Laterality Date  . ABDOMINAL HYSTERECTOMY    . BREAST BIOPSY Right 01/15/2016    cylinder shape calcs UOQ, FIBROADENOMATOUS  . BREAST BIOPSY Right 12/30/2016   stereo bx for calcifications: DCIS UOQ, ribbon shaped  . BREAST BIOPSY Right 01/08/2017   LOQ x shaped, FIBROADENOMATOUS CHANGE WITH  . BREAST LUMPECTOMY Right 01/30/2018   DCIS clear margins  . BREAST LUMPECTOMY WITH NEEDLE LOCALIZATION Right 01/30/2017   Procedure: BREAST LUMPECTOMY WITH NEEDLE LOCALIZATION;  Surgeon: Vickie Epley, MD;  Location: [facility];  Service: General;   Laterality: Right;  . COLONOSCOPY WITH PROPOFOL N/A 11/19/2016   Procedure: COLONOSCOPY WITH PROPOFOL;  Surgeon: Jonathon Bellows, MD;  Location: [place];  Service: Endoscopy;  Laterality: N/A;  . ESOPHAGOGASTRODUODENOSCOPY (EGD) WITH PROPOFOL N/A 11/19/2016   Procedure: ESOPHAGOGASTRODUODENOSCOPY (EGD) WITH PROPOFOL;  Surgeon: Jonathon Bellows, MD;  Location: [place];  Service: Endoscopy;  Laterality: N/A;  . GIVENS CAPSULE STUDY N/A 12/12/2016   Procedure: GIVENS CAPSULE STUDY;  Surgeon: Jonathon Bellows, MD;  Location: [facility] ENDOSCOPY;  Service: Gastroenterology;  Laterality: N/A;  . TOTAL MASTECTOMY Right 01/30/2017   Procedure: TOTAL MASTECTOMY;  Surgeon: Vickie Epley, MD;  Location: [facility];  Service: General;  Laterality: Right;   Family History  Problem Relation Age of Onset  . Cancer Mother 17       liver cancer  . Diabetes Mother   . Hypertension Mother   . Glomerulonephritis Father   . Diabetes Sister   . Glomerulonephritis Sister   . Cancer Sister 41       throat cancer  . Heart disease Sister   . Diabetes Sister   . Diabetes Sister   . COPD Sister   . Diabetes Sister   . Breast cancer Cousin        Currently undergoing systemic chemotherapy (01/2017)  . Varicose Veins Neg Hx    Social History   Tobacco Use  . Smoking status: Never Smoker  . Smokeless tobacco: Never Used  Substance Use Topics  . Alcohol use: No    Alcohol/week: 0.0 standard drinks  . Drug use: No     [facility] Visit from [facility]/24/2018 in [facility]  AUDIT-C Score  0      Interim medical history since last visit reviewed. Allergies and medications reviewed  Review of Systems Per HPI unless specifically indicated above     Objective:    BP 122/64   Pulse 91   Temp 97.6 F (36.4 C)   Ht 5\' 2"  (1.575 m)   Wt 182 lb 6.4 oz (82.7 kg)   SpO2 98%   BMI 33.36 kg/m   Wt Readings from Last 3 Encounters:  02/24/18 182 lb 6.4 oz (82.7 kg)  01/14/18 189 lb (85.7  kg)  11/04/17 190 lb 11.2 oz (86.5 kg)    Physical Exam  Constitutional: She appears well-developed and well-nourished. No distress.  HENT:  Head: Normocephalic and atraumatic.  Eyes: EOM are normal. No scleral icterus.  Neck: No thyromegaly present.  Cardiovascular: Normal rate, regular rhythm and normal heart sounds.  No murmur heard. Pulmonary/Chest: Effort normal and breath sounds normal.  No respiratory distress. She has no wheezes.  Abdominal: Soft. Bowel sounds are normal. She exhibits no distension.  Musculoskeletal: She exhibits no edema.  Neurological: She is alert.  Skin: Skin is warm and dry. She is not diaphoretic. No pallor.  Psychiatric: She has a normal mood and affect. Her behavior is normal. Judgment and thought content normal.    Results for orders placed or performed in visit on 62/70/35  BASIC METABOLIC PANEL WITH GFR  Result Value Ref Range   Glucose, Bld 123 65 - 139 mg/dL   BUN 16 7 - 25 mg/dL   Creat 1.04 (H) 0.50 - 0.99 mg/dL   GFR, Est Non African American 55 (L) > OR = 60 mL/min/1.66m2   GFR, Est African American 63 > OR = 60 mL/min/1.56m2   BUN/Creatinine Ratio 15 6 - 22 (calc)   Sodium 140 135 - 146 mmol/L   Potassium 3.8 3.5 - 5.3 mmol/L   Chloride 102 98 - 110 mmol/L   CO2 29 20 - 32 mmol/L   Calcium 10.0 8.6 - 10.4 mg/dL      Assessment & Plan:   Problem List Items Addressed This Visit      Cardiovascular and Mediastinum   Benign essential HTN    Very well-controlled today; she would like to continue her current medicine; offered to reduce, but she wants to wait until next time; she can monitor her BP at home        Genitourinary   Chronic kidney disease, stage II (mild)    Monitor Cr and GFR in January        Other   Vitamin D deficiency    Check level in jan       Relevant Orders   VITAMIN D 25 Hydroxy (Vit-D Deficiency, Fractures)   Vitamin B12 deficiency    Check level in Jan      Relevant Orders   Vitamin B12    Prediabetes - Primary    Check A1c on or after January 4th; so proud of her weight loss and healthier lifetstyle efforts; explained that significant weight loss is the single most important thing she can do to help prevent type 2 diabetes      Relevant Orders   Hemoglobin A1c   Obesity (BMI 30.0-34.9)    Encouragement given to keep up the great job with weight loss and healthier eating      Medication monitoring encounter    Check labs in [place]      Relevant Orders   COMPLETE METABOLIC PANEL WITH GFR   HLD (hyperlipidemia)    contginue the good work; check labs in early January      Relevant Orders   Lipid panel   DCIS (ductal carcinoma in situ) of breast    Managed by oncologist and radiation oncologist; continue arimedex       Other Visit Diagnoses    Need for influenza vaccination       Relevant Orders   Flu vaccine HIGH DOSE PF (Fluzone High dose) (Completed)       Follow up plan: Return in about 2 months (around 05/10/2018) for for labs only, then see me later that week.  An after-visit summary was printed and given to the patient at [place].  Please see the patient instructions which may contain other information and recommendations beyond what is mentioned above in the assessment and plan.  No orders of the defined types were placed in this encounter.   Orders Placed This  Encounter  Procedures  . Flu vaccine HIGH DOSE PF (Fluzone High dose)  . COMPLETE METABOLIC PANEL WITH GFR  . Hemoglobin A1c  . Lipid panel  . VITAMIN D 25 Hydroxy (Vit-D Deficiency, Fractures)  . Vitamin B12

## 2018-02-24 NOTE — Assessment & Plan Note (Signed)
contginue the good work; check labs in early January

## 2018-02-24 NOTE — Assessment & Plan Note (Signed)
Very well-controlled today; she would like to continue her current medicine; offered to reduce, but she wants to wait until next time; she can monitor her BP at home

## 2018-03-01 ENCOUNTER — Ambulatory Visit: Payer: BC Managed Care – PPO | Admitting: Radiation Oncology

## 2018-03-17 ENCOUNTER — Ambulatory Visit
Admission: RE | Admit: 2018-03-17 | Discharge: 2018-03-17 | Disposition: A | Discharge status: Home / Self Care | Payer: BC Managed Care – PPO | Source: Ambulatory Visit | Attending: Radiation Oncology | Admitting: Radiation Oncology

## 2018-03-17 ENCOUNTER — Other Ambulatory Visit: Payer: Self-pay

## 2018-03-17 ENCOUNTER — Encounter: Payer: Self-pay | Admitting: Radiation Oncology

## 2018-03-17 VITALS — BP 139/88 | HR 83 | Temp 97.9°F | Resp 18 | Wt 182.9 lb

## 2018-03-17 DIAGNOSIS — Z170 Estrogen receptor positive status [ER+]: Secondary | ICD-10-CM | POA: Insufficient documentation

## 2018-03-17 DIAGNOSIS — Z923 Personal history of irradiation: Secondary | ICD-10-CM | POA: Diagnosis not present

## 2018-03-17 DIAGNOSIS — D0511 Intraductal carcinoma in situ of right breast: Secondary | ICD-10-CM | POA: Diagnosis not present

## 2018-03-17 DIAGNOSIS — Z79811 Long term (current) use of aromatase inhibitors: Secondary | ICD-10-CM | POA: Insufficient documentation

## 2018-03-17 NOTE — Progress Notes (Signed)
Radiation Oncology Follow up Note  Name: Marissa Nelson   Date:   03/17/2018 MRN:  [id number] DOB: [birth year]    This 67 y.o. female presents to the clinic today for 11 month follow-up status post whole breast radiation to her right breast for ER/PR positive ductal carcinoma in situ.  REFERRING PROVIDER: Arnetha Courser, MD  HPI: patient is a 67 year old female now out 11 months.status post whole breast radiation to her right breast for ER/PR positive ductal carcinoma in situ. Seen today in routine follow-up she is doing well. She specifically denies breast tenderness cough or bone pain.she had a mammogram back in September which I have reviewed was BI-RADS 2 benign.she's currently on arimadex time that well without side effect.  COMPLICATIONS OF TREATMENT: none  FOLLOW UP COMPLIANCE: keeps appointments   PHYSICAL EXAM:  BP 139/88 (BP Location: Left Arm, Patient Position: Sitting)   Pulse 83   Temp 97.9 F (36.6 C) (Tympanic)   Resp 18   Wt 182 lb 14 oz (83 kg)   BMI 33.45 kg/m  Lungs are clear to A&P cardiac examination essentially unremarkable with regular rate and rhythm. No dominant mass or nodularity is noted in either breast in 2 positions examined. Incision is well-healed. No axillary or supraclavicular adenopathy is appreciated. Cosmetic result is excellent.Well-developed well-nourished patient in NAD. HEENT reveals PERLA, EOMI, discs not visualized.  Oral cavity is clear. No oral mucosal lesions are identified. Neck is clear without evidence of cervical or supraclavicular adenopathy. Lungs are clear to A&P. Cardiac examination is essentially unremarkable with regular rate and rhythm without murmur rub or thrill. Abdomen is benign with no organomegaly or masses noted. Motor sensory and DTR levels are equal and symmetric in the upper and lower extremities. Cranial nerves II through XII are grossly intact. Proprioception is intact. No peripheral adenopathy or edema is identified.  No motor or sensory levels are noted. Crude visual fields are within normal range.  RADIOLOGY RESULTS: mammograms reviewed and compatible with the above-stated findings  PLAN: present time patient continues to do well with no evidence of disease. I'm please were overall progress. I've asked to see her back in 1 year for follow-up. She continues onarimadex without side effect. Patient knows to call at anytime with any concerns.  I would like to take this opportunity to thank you for allowing me to participate in the care of your patient.Noreene Filbert, MD

## 2018-03-25 ENCOUNTER — Other Ambulatory Visit: Payer: Self-pay | Admitting: Oncology

## 2018-04-06 ENCOUNTER — Other Ambulatory Visit: Payer: Self-pay

## 2018-04-06 ENCOUNTER — Inpatient Hospital Stay: Payer: BC Managed Care – PPO | Attending: Oncology | Admitting: Oncology

## 2018-04-06 VITALS — BP 134/84 | HR 83 | Temp 97.3°F | Resp 18 | Wt 180.5 lb

## 2018-04-06 DIAGNOSIS — Z79811 Long term (current) use of aromatase inhibitors: Secondary | ICD-10-CM | POA: Diagnosis not present

## 2018-04-06 DIAGNOSIS — Z79899 Other long term (current) drug therapy: Secondary | ICD-10-CM | POA: Diagnosis not present

## 2018-04-06 DIAGNOSIS — Z9011 Acquired absence of right breast and nipple: Secondary | ICD-10-CM | POA: Insufficient documentation

## 2018-04-06 DIAGNOSIS — Z923 Personal history of irradiation: Secondary | ICD-10-CM | POA: Insufficient documentation

## 2018-04-06 DIAGNOSIS — I10 Essential (primary) hypertension: Secondary | ICD-10-CM | POA: Diagnosis not present

## 2018-04-06 DIAGNOSIS — D0511 Intraductal carcinoma in situ of right breast: Secondary | ICD-10-CM | POA: Insufficient documentation

## 2018-04-06 DIAGNOSIS — Z170 Estrogen receptor positive status [ER+]: Secondary | ICD-10-CM | POA: Diagnosis not present

## 2018-04-06 DIAGNOSIS — Z853 Personal history of malignant neoplasm of breast: Secondary | ICD-10-CM

## 2018-04-06 DIAGNOSIS — Z08 Encounter for follow-up examination after completed treatment for malignant neoplasm: Secondary | ICD-10-CM

## 2018-04-06 NOTE — Progress Notes (Signed)
Here for follow up. Per pt overall" Im in good health, I am  doing well and see you next year" pt stated.

## 2018-04-08 ENCOUNTER — Encounter: Payer: Self-pay | Admitting: Oncology

## 2018-04-08 NOTE — Progress Notes (Signed)
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 Patient is a 67 year old African-American female with no significant comorbidities other than hypertension. She underwent screening bilateral mammogram in August 2017 which showed calcifications in her right breast. This was followed by a diagnostic mammogram which showed 2 sets of calcifications: 1 was 4 mm in the upper outer right breast and an additional 7 mm calcification in the central subareolar right breast. The 4 mm group of calcifications was biopsies and wasnegative for malignancy. Repeat mammogram was recommended in 6 months. This mammogram showed a new 15 mm calcifications in the upper outer quadrant of the right breast measuring 12 x 15 x 13 mm. Stable group of benign-appearing calcifications in the right breast upper outer quadrant measuring 5 mm.  2. Patient underwent 2 sets of biopsies. Biopsies from the right upper outer quadrant showed intermediate grade DCIS with comedonecrosis and associated calcifications. ER greater than 90% positive and PR was 11-50% positive. Biopsy of the right lower quadrant of the right breast showed fibroadenomatous changes with calcifications.  3. Patient has been seen by Dr. Layla Maw is scheduled to undergo lumpectomy on 01/30/2017  4. Patient had menarche at the age of 27. 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 History of breast cancer in one of her sisters. She is G2 P2 L2. She did not breast-feed.  5.Patient underwent  lumpectomy on 01/30/2017 which showed a 15 mm DCIS, grade 2 with comedonecrosis. Margins were -3 mm from the closest medial margin lymph nodes were not submitted. Pathology stagepTisNx.ER greater than 90% positive PR 11-50% positive  6. Patient completed adjuvant RT and started arimidex in jan 2019   Interval history- she is doing well and tolerating arimidex without any significant side effects.   ECOG PS- 0 Pain scale- 0 Opioid associated constipation- no  Review of systems- Review of Systems  Constitutional: Negative for chills, fever, malaise/fatigue and weight loss.  HENT: Negative for congestion, ear discharge and nosebleeds.   Eyes: Negative for blurred vision.  Respiratory: Negative for cough, hemoptysis, sputum production, shortness of breath and wheezing.   Cardiovascular: Negative for chest pain, palpitations, orthopnea and claudication.  Gastrointestinal: Negative for abdominal pain, blood in stool, constipation, diarrhea, heartburn, melena, nausea and vomiting.  Genitourinary: Negative for dysuria, flank pain, frequency, hematuria and urgency.  Musculoskeletal: Negative for back pain, joint pain and myalgias.  Skin: Negative for rash.  Neurological: Negative for dizziness, tingling, focal weakness, seizures, weakness and headaches.  Endo/Heme/Allergies: Does not bruise/bleed easily.  Psychiatric/Behavioral: Negative for depression and suicidal ideas. The patient does not have insomnia.       Allergies  Allergen Reactions  . Shellfish Allergy Swelling    angioedema  . Penicillin G Rash    Has patient had a PCN reaction causing immediate rash, facial/tongue/throat swelling, SOB or lightheadedness with hypotension: Yes Has patient had a PCN reaction causing severe rash involving mucus membranes or skin necrosis: Yes Has patient had a PCN reaction that required hospitalization: No Has patient had a PCN reaction occurring within the last  10 years: No If all of the  above answers are "NO", then may proceed with Cephalosporin use.      Past Medical History:  Diagnosis Date  . Abnormal mammogram of right breast 01/17/2016   Recommendation: Six month follow-up diagnostic mammogram of the right breast for an additional group of probably benign Calcifications. Sept 2017 --> due March 2018  . Arthralgia 10/04/2016  . Arthritis    hands  . Benign essential HTN 12/07/2006  . Breast cancer ([place]) 2018   right breast DCIS with rad tx  . Breast cancer screening 11/15/2015  . Chronic kidney disease, stage II (mild) 11/15/2015  . Chronic left shoulder pain 11/08/2014  . 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 Family history of diabetes mellitus 06/12/2016   Mother and several other relatives  . HLD (hyperlipidemia) 06/15/2015  . Hx of right breast biopsy 01/17/2016  . Hyperlipidemia   . Hypertension   . Medication monitoring encounter 11/15/2015  . Motion sickness    ocean ships  . Numbness and tingling in left hand 11/07/2014  . Obesity 10/25/2015  . Obesity (BMI 30.0-34.9) 10/25/2015  . Personal history of radiation therapy 2018   right breast ca  . Positive ANA (antinuclear antibody) 10/04/2016  . Prediabetes   . Preventative health care 11/15/2015  . Vitamin D deficiency 10/29/2015     Past Surgical History:  Procedure Laterality Date  . ABDOMINAL HYSTERECTOMY    . BREAST BIOPSY Right 01/15/2016    cylinder shape calcs UOQ, FIBROADENOMATOUS  . BREAST BIOPSY Right 12/30/2016   stereo bx for calcifications: DCIS UOQ, ribbon shaped  . BREAST BIOPSY Right 01/08/2017   LOQ x shaped, FIBROADENOMATOUS CHANGE WITH  . BREAST LUMPECTOMY Right 01/30/2018   DCIS clear margins  . BREAST LUMPECTOMY WITH NEEDLE LOCALIZATION Right 01/30/2017   Procedure: BREAST LUMPECTOMY WITH NEEDLE LOCALIZATION;  Surgeon: Vickie Epley, MD;  Location: [facility];  Service: General;  Laterality: Right;  . COLONOSCOPY WITH PROPOFOL N/A 11/19/2016    Procedure: COLONOSCOPY WITH PROPOFOL;  Surgeon: Jonathon Bellows, MD;  Location: [place];  Service: Endoscopy;  Laterality: N/A;  . ESOPHAGOGASTRODUODENOSCOPY (EGD) WITH PROPOFOL N/A 11/19/2016   Procedure: ESOPHAGOGASTRODUODENOSCOPY (EGD) WITH PROPOFOL;  Surgeon: Jonathon Bellows, MD;  Location: [place];  Service: Endoscopy;  Laterality: N/A;  . GIVENS CAPSULE STUDY N/A 12/12/2016   Procedure: GIVENS CAPSULE STUDY;  Surgeon: Jonathon Bellows, MD;  Location: [facility];  Service: Gastroenterology;  Laterality: N/A;  . TOTAL MASTECTOMY Right 01/30/2017   Procedure: TOTAL MASTECTOMY;  Surgeon: Vickie Epley, MD;  Location: [facility];  Service: General;  Laterality: Right;    Social History   Socioeconomic History  . Marital status: Married    Spouse name: Not on file  . Number of children: 2  . Years of education: Not on file  . Highest education level: Not on file  Occupational History  . Occupation: Works in [employer] [place], [place])    Employer: [employer]  . Financial resource strain: Not on file  . Food insecurity:    Worry: Not on file    Inability: Not on file  . Transportation needs:    Medical: Not on file    Non-medical: Not on file  Tobacco Use  . Smoking status: Never Smoker  . Smokeless tobacco: Never Used  Substance and Sexual Activity  . Alcohol use: No    Alcohol/week: 0.0 standard  drinks  . Drug use: No  . Sexual activity: Not Currently  Lifestyle  . Physical activity:    Days per week: Not on file    Minutes per session: Not on file  . Stress: Not on file  Relationships  . Social connections:    Talks on phone: Not on file    Gets together: Not on file    Attends religious service: Not on file    Active member of club or organization: Not on file    Attends meetings of clubs or organizations: Not on file    Relationship status: Not on file  . Intimate partner violence:    Fear of current or ex  partner: Not on file    Emotionally abused: Not on file    Physically abused: Not on file    Forced sexual activity: Not on file  Other Topics Concern  . Not on file  Social History Narrative  . Not on file    Family History  Problem Relation Age of Onset  . Cancer Mother 49       liver cancer  . Diabetes Mother   . Hypertension Mother   . Glomerulonephritis Father   . Diabetes Sister   . Glomerulonephritis Sister   . Cancer Sister 18       throat cancer  . Heart disease Sister   . Diabetes Sister   . Diabetes Sister   . COPD Sister   . Diabetes Sister   . Breast cancer Cousin        Currently undergoing systemic chemotherapy (01/2017)  . Varicose Veins Neg Hx      Current Outpatient Medications:  .  anastrozole (ARIMIDEX) 1 MG tablet, TAKE ONE TABLET BY MOUTH EVERY DAY, Disp: 90 tablet, Rfl: 1 .  aspirin EC 81 MG tablet, Take 1 tablet (81 mg total) by mouth daily., Disp: , Rfl:  .  atorvastatin (LIPITOR) 10 MG tablet, Take 1 tablet (10 mg total) by mouth at bedtime., Disp: 30 tablet, Rfl: 1 .  calcium-vitamin D (OSCAL WITH D) 500-200 MG-UNIT tablet, Take 1 tablet by mouth., Disp: , Rfl:  .  Cyanocobalamin (VITAMIN B-12) 500 MCG SUBL, Place under the tongue., Disp: , Rfl:  .  metoprolol succinate (TOPROL-XL) 25 MG 24 hr tablet, Take 1 tablet (25 mg total) by mouth daily., Disp: 90 tablet, Rfl: 0 .  triamterene-hydrochlorothiazide (MAXZIDE-25) 37.5-25 MG tablet, Take 1 tablet by mouth daily., Disp: 90 tablet, Rfl: 3 .  acetaminophen (TYLENOL) 325 MG tablet, Take 650 mg by mouth every 6 (six) hours as needed for mild pain or headache. , Disp: , Rfl:   Physical exam:  Vitals:   04/06/18 1431  BP: 134/84  Pulse: 83  Resp: 18  Temp: (!) 97.3 F (36.3 C)  TempSrc: Tympanic  Weight: 180 lb 8 oz (81.9 kg)   Physical Exam  Constitutional: She is oriented to person, place, and time. She appears well-developed and well-nourished.  HENT:  Head: Normocephalic and atraumatic.   Eyes: Pupils are equal, round, and reactive to light. EOM are normal.  Neck: Normal range of motion.  Cardiovascular: Normal rate, regular rhythm and normal heart sounds.  Pulmonary/Chest: Effort normal and breath sounds normal.  Abdominal: Soft. Bowel sounds are normal.  Neurological: She is alert and oriented to person, place, and time.  Skin: Skin is warm and dry.     CMP Latest Ref Rng & Units 11/13/2017  Glucose 65 - 139 mg/dL 123  BUN 7 -  25 mg/dL 16  Creatinine 0.50 - 0.99 mg/dL 1.04(H)  Sodium 135 - 146 mmol/L 140  Potassium 3.5 - 5.3 mmol/L 3.8  Chloride 98 - 110 mmol/L 102  CO2 20 - 32 mmol/L 29  Calcium 8.6 - 10.4 mg/dL 10.0  Total Protein 6.1 - 8.1 g/dL -  Total Bilirubin 0.2 - 1.2 mg/dL -  Alkaline Phos 38 - 126 U/L -  AST 10 - 35 U/L -  ALT 6 - 29 U/L -   CBC Latest Ref Rng & Units 04/14/2017  WBC 3.6 - 11.0 K/uL 5.0  Hemoglobin 12.0 - 16.0 g/dL 12.0  Hematocrit 35.0 - 47.0 % 35.5  Platelets 150 - 440 K/uL 217     Assessment and plan- Patient is a 67 y.o. female  with right breast DCIS status post lumpectomyand radiation currently on arimidex. She is here for routine f/u of breast cancer   Overall she is doing well. No evidence of recurrence clinically. Continue Arimidex calcium and vit D for 6 months. I will see her back in 6 months   Visit Diagnosis 1. Encounter for follow-up surveillance of breast cancer      Dr. Randa Evens, MD, MPH [facility] at [facility] [phone] 04/08/2018 8:23 AM

## 2018-05-12 ENCOUNTER — Other Ambulatory Visit: Payer: Self-pay

## 2018-05-12 DIAGNOSIS — Z5181 Encounter for therapeutic drug level monitoring: Secondary | ICD-10-CM

## 2018-05-12 DIAGNOSIS — E782 Mixed hyperlipidemia: Secondary | ICD-10-CM

## 2018-05-12 DIAGNOSIS — E538 Deficiency of other specified B group vitamins: Secondary | ICD-10-CM

## 2018-05-12 DIAGNOSIS — E559 Vitamin D deficiency, unspecified: Secondary | ICD-10-CM

## 2018-05-12 DIAGNOSIS — R7303 Prediabetes: Secondary | ICD-10-CM

## 2018-05-13 ENCOUNTER — Other Ambulatory Visit: Payer: Self-pay | Admitting: Family Medicine

## 2018-05-13 DIAGNOSIS — E782 Mixed hyperlipidemia: Secondary | ICD-10-CM

## 2018-05-13 DIAGNOSIS — N183 Chronic kidney disease, stage 3 unspecified: Secondary | ICD-10-CM

## 2018-05-13 LAB — LIPID PANEL
CHOLESTEROL: 220 mg/dL — AB (ref <200)
HDL: 56 mg/dL (ref >50)
LDL Cholesterol (Calc): 146 mg/dL (calc) — ABNORMAL HIGH
NON-HDL CHOLESTEROL (CALC): 164 mg/dL — AB (ref <130)
Total CHOL/HDL Ratio: 3.9 (calc) (ref <5.0)
Triglycerides: 80 mg/dL (ref <150)

## 2018-05-13 LAB — COMPLETE METABOLIC PANEL WITH GFR
AG Ratio: 1.4 (calc) (ref 1.0–2.5)
ALKALINE PHOSPHATASE (APISO): 100 U/L (ref 33–130)
ALT: 20 U/L (ref 6–29)
AST: 19 U/L (ref 10–35)
Albumin: 4.1 g/dL (ref 3.6–5.1)
BUN/Creatinine Ratio: 20 (calc) (ref 6–22)
BUN: 28 mg/dL — AB (ref 7–25)
CO2: 27 mmol/L (ref 20–32)
Calcium: 9.8 mg/dL (ref 8.6–10.4)
Chloride: 102 mmol/L (ref 98–110)
Creat: 1.37 mg/dL — ABNORMAL HIGH (ref 0.50–0.99)
GFR, Est African American: 45 mL/min/{1.73_m2} — ABNORMAL LOW (ref >=60)
GFR, Est Non African American: 39 mL/min/{1.73_m2} — ABNORMAL LOW (ref >=60)
GLUCOSE: 86 mg/dL (ref 65–99)
Globulin: 3 g/dL (calc) (ref 1.9–3.7)
Potassium: 4.4 mmol/L (ref 3.5–5.3)
Sodium: 140 mmol/L (ref 135–146)
TOTAL PROTEIN: 7.1 g/dL (ref 6.1–8.1)
Total Bilirubin: 0.3 mg/dL (ref 0.2–1.2)

## 2018-05-13 LAB — HEMOGLOBIN A1C
EAG (MMOL/L): 7.1 (calc)
Hgb A1c MFr Bld: 6.1 % of total Hgb — ABNORMAL HIGH (ref <5.7)
MEAN PLASMA GLUCOSE: 128 (calc)

## 2018-05-13 LAB — VITAMIN D 25 HYDROXY (VIT D DEFICIENCY, FRACTURES): Vit D, 25-Hydroxy: 24 ng/mL — ABNORMAL LOW (ref 30–100)

## 2018-05-13 LAB — VITAMIN B12: VITAMIN B 12: 828 pg/mL (ref 200–1100)

## 2018-05-13 MED ORDER — ATORVASTATIN CALCIUM 20 MG PO TABS: 20.0000 mg | ORAL_TABLET | Freq: Every day | ORAL | 1 refills | Status: DC

## 2018-05-13 NOTE — Progress Notes (Signed)
Increase statin Add extra vitamin D OTC for 3 months Refer to nephrologist Recheck lipids in 6 weeks

## 2018-05-14 ENCOUNTER — Encounter: Payer: Self-pay | Admitting: Family Medicine

## 2018-05-14 ENCOUNTER — Ambulatory Visit (INDEPENDENT_AMBULATORY_CARE_PROVIDER_SITE_OTHER): Payer: BC Managed Care – PPO | Admitting: Family Medicine

## 2018-05-14 VITALS — BP 122/78 | HR 98 | Temp 98.0°F | Ht 62.0 in | Wt 175.5 lb

## 2018-05-14 DIAGNOSIS — E669 Obesity, unspecified: Secondary | ICD-10-CM | POA: Diagnosis not present

## 2018-05-14 DIAGNOSIS — I10 Essential (primary) hypertension: Secondary | ICD-10-CM

## 2018-05-14 DIAGNOSIS — E559 Vitamin D deficiency, unspecified: Secondary | ICD-10-CM

## 2018-05-14 DIAGNOSIS — J00 Acute nasopharyngitis [common cold]: Secondary | ICD-10-CM

## 2018-05-14 DIAGNOSIS — D0511 Intraductal carcinoma in situ of right breast: Secondary | ICD-10-CM

## 2018-05-14 DIAGNOSIS — R809 Proteinuria, unspecified: Secondary | ICD-10-CM

## 2018-05-14 DIAGNOSIS — N183 Chronic kidney disease, stage 3 unspecified: Secondary | ICD-10-CM

## 2018-05-14 DIAGNOSIS — E782 Mixed hyperlipidemia: Secondary | ICD-10-CM

## 2018-05-14 LAB — POCT URINALYSIS DIPSTICK
Bilirubin, UA: NEGATIVE
Blood, UA: NEGATIVE
Glucose, UA: NEGATIVE
KETONES UA: NEGATIVE
LEUKOCYTES UA: NEGATIVE
Nitrite, UA: NEGATIVE
PH UA: 5 (ref 5.0–8.0)
PROTEIN UA: POSITIVE — AB
Spec Grav, UA: 1.015 (ref 1.010–1.025)
UROBILINOGEN UA: NEGATIVE U/dL — AB

## 2018-05-14 MED ORDER — BENZONATATE 100 MG PO CAPS: 100.0000 mg | ORAL_CAPSULE | Freq: Three times a day (TID) | ORAL | 0 refills | Status: DC | PRN

## 2018-05-14 MED ORDER — ATORVASTATIN CALCIUM 20 MG PO TABS: 20.0000 mg | ORAL_TABLET | Freq: Every day | ORAL | 1 refills | Status: DC

## 2018-05-14 NOTE — Progress Notes (Signed)
BP 122/78   Pulse 98   Temp 98 F (36.7 C)   Ht 5\' 2"  (1.575 m)   Wt 175 lb 8 oz (79.6 kg)   SpO2 99%   BMI 32.10 kg/m    Subjective:    Patient ID: Marissa Nelson, female    DOB: [birth year], 68 y.o.   MRN: [id number]  HPI: Marissa Nelson is a 68 y.o. female  Chief Complaint  Patient presents with  . Follow-up    HPI  She has lost weight; gave up bread, gave up bread, gave up red meat; gave up cheese too; she is going to start walking High cholesterol; total 220, LDL 146; giving up meat and cheese; not drinking milk with my blessing; no ice cream either; not sure if it runs in the family Prediabetes; A1c was 6.1; some dry mouth; no blurred vision; giving up bread; no sweets or sugary drinks Normal vit B12 Vitamin D low; taking a little bit in her calcium CKD; kidney disease runs in the family; cousin is going on dialysis; niece has kidney problems; father had kidney problems; loves water; no NSAIDs HTN; no problems with medicine, controlled; checks BP away from here and gets 55s and 130sw Seeing Dr. Janese Banks at cancer center for DCIS; going every six months; seeing Dr. Baruch Gouty yearly, the Dr. Rosana Hoes in March; everything looks good; doing breast exams every month She has been dealing with a cold and cough; coughing all night; no fever, husband was sick last week; patient has been sick for a week, last Saturday; no body aches, just sinus drainage  Depression screen [facility] 2/9 05/14/2018 02/24/2018 02/24/2018 11/04/2017 07/27/2017  Decreased Interest 0 0 0 0 0  Down, Depressed, Hopeless 0 0 0 0 0  PHQ - 2 Score 0 0 0 0 0  Altered sleeping 0 0 - - -  Tired, decreased energy 0 0 - - -  Change in appetite 0 0 - - -  Feeling bad or failure about yourself  0 0 - - -  Trouble concentrating 0 0 - - -  Moving slowly or fidgety/restless 0 0 - - -  Suicidal thoughts 0 0 - - -  PHQ-9 Score 0 0 - - -  Difficult doing work/chores Not difficult at all Not difficult at all - - -   Fall  Risk  05/14/2018 02/24/2018 11/04/2017 07/27/2017 05/18/2017  Falls in the past year? 0 No No No No    Relevant past medical, surgical, family and social history reviewed Past Medical History:  Diagnosis Date  . Abnormal mammogram of right breast 01/17/2016   Recommendation: Six month follow-up diagnostic mammogram of the right breast for an additional group of probably benign Calcifications. Sept 2017 --> due March 2018  . Arthralgia 10/04/2016  . Arthritis    hands  . Benign essential HTN 12/07/2006  . Breast cancer ([place]) 2018   right breast DCIS with rad tx  . Breast cancer screening 11/15/2015  . Chronic kidney disease, stage II (mild) 11/15/2015  . Chronic left shoulder pain 11/08/2014  . 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 Family history of diabetes mellitus 06/12/2016   Mother and several other relatives  . HLD (hyperlipidemia) 06/15/2015  . Hx of right breast biopsy 01/17/2016  . Hyperlipidemia   . Hypertension   . Medication monitoring encounter 11/15/2015  . Motion sickness    ocean ships  . Numbness and tingling in left  hand 11/07/2014  . Obesity 10/25/2015  . Obesity (BMI 30.0-34.9) 10/25/2015  . Personal history of radiation therapy 2018   right breast ca  . Positive ANA (antinuclear antibody) 10/04/2016  . Prediabetes   . Preventative health care 11/15/2015  . Vitamin D deficiency 10/29/2015   Past Surgical History:  Procedure Laterality Date  . ABDOMINAL HYSTERECTOMY    . BREAST BIOPSY Right 01/15/2016    cylinder shape calcs UOQ, FIBROADENOMATOUS  . BREAST BIOPSY Right 12/30/2016   stereo bx for calcifications: DCIS UOQ, ribbon shaped  . BREAST BIOPSY Right 01/08/2017   LOQ x shaped, FIBROADENOMATOUS CHANGE WITH  . BREAST LUMPECTOMY Right 01/30/2018   DCIS clear margins  . BREAST LUMPECTOMY WITH NEEDLE LOCALIZATION Right 01/30/2017   Procedure: BREAST LUMPECTOMY WITH NEEDLE LOCALIZATION;  Surgeon: Vickie Epley, MD;  Location: [facility];  Service: General;  Laterality: Right;  . COLONOSCOPY WITH PROPOFOL N/A 11/19/2016   Procedure: COLONOSCOPY WITH PROPOFOL;  Surgeon: Jonathon Bellows, MD;  Location: [place];  Service: Endoscopy;  Laterality: N/A;  . ESOPHAGOGASTRODUODENOSCOPY (EGD) WITH PROPOFOL N/A 11/19/2016   Procedure: ESOPHAGOGASTRODUODENOSCOPY (EGD) WITH PROPOFOL;  Surgeon: Jonathon Bellows, MD;  Location: [place];  Service: Endoscopy;  Laterality: N/A;  . GIVENS CAPSULE STUDY N/A 12/12/2016   Procedure: GIVENS CAPSULE STUDY;  Surgeon: Jonathon Bellows, MD;  Location: [facility] ENDOSCOPY;  Service: Gastroenterology;  Laterality: N/A;  . TOTAL MASTECTOMY Right 01/30/2017   Procedure: TOTAL MASTECTOMY;  Surgeon: Vickie Epley, MD;  Location: [facility];  Service: General;  Laterality: Right;   Family History  Problem Relation Age of Onset  . Cancer Mother 12       liver cancer  . Diabetes Mother   . Hypertension Mother   . Glomerulonephritis Father   . Diabetes Sister   . Glomerulonephritis Sister   . Cancer Sister 56       throat cancer  . Heart disease Sister   . Diabetes Sister   . Diabetes Sister   . COPD Sister   . Diabetes Sister   . Breast cancer Cousin        Currently undergoing systemic chemotherapy (01/2017)  . Varicose Veins Neg Hx    Social History   Tobacco Use  . Smoking status: Never Smoker  . Smokeless tobacco: Never Used  Substance Use Topics  . Alcohol use: No    Alcohol/week: 0.0 standard drinks  . Drug use: No     [facility] Visit from [facility]/14/2018 in [facility]  AUDIT-C Score  0      Interim medical history since last visit reviewed. Allergies and medications reviewed  Review of Systems Per HPI unless specifically indicated above     Objective:    BP 122/78   Pulse 98   Temp 98 F (36.7 C)   Ht 5\' 2"  (1.575 m)   Wt 175 lb 8 oz (79.6 kg)   SpO2 99%   BMI 32.10 kg/m   Wt Readings from Last 3 Encounters:  05/14/18 175 lb 8 oz (79.6 kg)    04/06/18 180 lb 8 oz (81.9 kg)  03/17/18 182 lb 14 oz (83 kg)    Physical Exam  Results for orders placed or performed in visit on 05/14/18  POCT urinalysis dipstick  Result Value Ref Range   Color, UA yellow    Clarity, UA clear    Glucose, UA Negative Negative   Bilirubin, UA negative    Ketones, UA negative  Spec Grav, UA 1.015 1.010 - 1.025   Blood, UA negative    pH, UA 5.0 5.0 - 8.0   Protein, UA Positive (A) Negative   Urobilinogen, UA negative (A) 0.2 or 1.0 E.U./dL   Nitrite, UA negative    Leukocytes, UA Negative Negative   Appearance     Odor        Assessment & Plan:   Problem List Items Addressed This Visit      Cardiovascular and Mediastinum   Benign essential HTN    Well-controlled; so proud of patient's efforts at eating better and losing weight      Relevant Medications   atorvastatin (LIPITOR) 20 MG tablet     Genitourinary   Chronic kidney disease, stage III (moderate) (HCC)    See kidney doctor soon; check urine today; avoid NSAIDs; stay hydrated; kidney disease does run in the family        Other   Vitamin D deficiency    Start some extra vitmain D during the winter to build that up; it is coming up over the last 4 draws      Obesity (BMI 30.0-34.9)    She is really working on weight loss; see AVS      HLD (hyperlipidemia)    Refilled statin; she is working on [employer], avoid cow and pig food      Relevant Medications   atorvastatin (LIPITOR) 20 MG tablet   DCIS (ductal carcinoma in situ) of breast    Following up with surgeon and oncologist; doing breast exams       Other Visit Diagnoses    Proteinuria, unspecified type    -  Primary   Relevant Orders   POCT urinalysis dipstick (Completed)   Acute nasopharyngitis       rest, hydration, vitamin C, tessalon perles; no antibiotics indicated       Follow up plan: No follow-ups on file.  An after-visit summary was printed and given to the patient at [place].   Please see the patient instructions which may contain other information and recommendations beyond what is mentioned above in the assessment and plan.  Meds ordered this encounter  Medications  . atorvastatin (LIPITOR) 20 MG tablet    Sig: Take 1 tablet (20 mg total) by mouth at bedtime.    Dispense:  90 tablet    Refill:  1    Fill this instead, she'd like a 90 day supply, not 30 days as was sent yesterday  . benzonatate (TESSALON PERLES) 100 MG capsule    Sig: Take 1 capsule (100 mg total) by mouth every 8 (eight) hours as needed for cough.    Dispense:  30 capsule    Refill:  0    Orders Placed This Encounter  Procedures  . POCT urinalysis dipstick

## 2018-05-14 NOTE — Assessment & Plan Note (Signed)
See kidney doctor soon; check urine today; avoid NSAIDs; stay hydrated; kidney disease does run in the family

## 2018-05-14 NOTE — Assessment & Plan Note (Signed)
Refilled statin; she is working on [employer], avoid cow and pig food

## 2018-05-14 NOTE — Assessment & Plan Note (Signed)
Following up with surgeon and oncologist; doing breast exams

## 2018-05-14 NOTE — Assessment & Plan Note (Signed)
She is really working on weight loss; see AVS

## 2018-05-14 NOTE — Assessment & Plan Note (Signed)
Start some extra vitmain D during the winter to build that up; it is coming up over the last 4 draws

## 2018-05-14 NOTE — Patient Instructions (Addendum)
If you need something for aches or pains, try to use Tylenol (acetaminophen) instead of non-steroidals (which include Aleve, ibuprofen, Advil, Motrin, and naproxen); non-steroidals can cause long-term kidney damage Check out the information at familydoctor.org entitled "Nutrition for Weight Loss: What You Need to Know about Fad Diets" Try to lose between 1-2 pounds per week by taking in fewer calories and burning off more calories You can succeed by limiting portions, limiting foods dense in calories and fat, becoming more active, and drinking 8 glasses of water a day (64 ounces) Don't skip meals, especially breakfast, as skipping meals may alter your metabolism Do not use over-the-counter weight loss pills or gimmicks that claim rapid weight loss A healthy BMI (or body mass index) is between 18.5 and 24.9 You can calculate your ideal BMI at the [place] website ClubMonetize.fr   Obesity, Adult Obesity is the condition of having too much total body fat. Being overweight or obese means that your weight is greater than what is considered healthy for your body size. Obesity is determined by a measurement called BMI. BMI is an estimate of body fat and is calculated from height and weight. For adults, a BMI of 30 or higher is considered obese. Obesity can eventually lead to other health concerns and major illnesses, including:  Stroke.  Coronary artery disease (CAD).  Type 2 diabetes.  Some types of cancer, including cancers of the colon, breast, uterus, and gallbladder.  Osteoarthritis.  High blood pressure (hypertension).  High cholesterol.  Sleep apnea.  Gallbladder stones.  Infertility problems. What are the causes? The main cause of obesity is taking in (consuming) more calories than your body uses for energy. Other factors that contribute to this condition may include:  Being born with genes that make you more likely to become  obese.  Having a medical condition that causes obesity. These conditions include: ? Hypothyroidism. ? Polycystic ovarian syndrome (PCOS). ? Binge-eating disorder. ? Cushing syndrome.  Taking certain medicines, such as steroids, antidepressants, and seizure medicines.  Not being physically active (sedentary lifestyle).  Living where there are limited places to exercise safely or buy healthy foods.  Not getting enough sleep. What increases the risk? The following factors may increase your risk of this condition:  Having a family history of obesity.  Being a woman of African-American descent.  Being a man of Hispanic descent. What are the signs or symptoms? Having excessive body fat is the main symptom of this condition. How is this diagnosed? This condition may be diagnosed based on:  Your symptoms.  Your medical history.  A physical exam. Your health care provider may measure: ? Your BMI. If you are an adult with a BMI between 25 and less than 30, you are considered overweight. If you are an adult with a BMI of 30 or higher, you are considered obese. ? The distances around your hips and your waist (circumferences). These may be compared to each other to help diagnose your condition. ? Your skinfold thickness. Your health care provider may gently pinch a fold of your skin and measure it. How is this treated? Treatment for this condition often includes changing your lifestyle. Treatment may include some or all of the following:  Dietary changes. Work with your health care provider and a dietitian to set a weight-loss goal that is healthy and reasonable for you. Dietary changes may include eating: ? Smaller portions. A portion size is the amount of a particular food that is healthy for you to eat at  one time. This varies from person to person. ? Low-calorie or low-fat options. ? More whole grains, fruits, and vegetables.  Regular physical activity. This may include aerobic  activity (cardio) and strength training.  Medicine to help you lose weight. Your health care provider may prescribe medicine if you are unable to lose 1 pound a week after 6 weeks of eating more healthily and doing more physical activity.  Surgery. Surgical options may include gastric banding and gastric bypass. Surgery may be done if: ? Other treatments have not helped to improve your condition. ? You have a BMI of 40 or higher. ? You have life-threatening health problems related to obesity. Follow these instructions at home:  Eating and drinking   Follow recommendations from your health care provider about what you eat and drink. Your health care provider may advise you to: ? Limit fast foods, sweets, and processed snack foods. ? Choose low-fat options, such as low-fat milk instead of whole milk. ? Eat 5 or more servings of fruits or vegetables every day. ? Eat at home more often. This gives you more control over what you eat. ? Choose healthy foods when you eat out. ? Learn what a healthy portion size is. ? Keep low-fat snacks on hand. ? Avoid sugary drinks, such as soda, fruit juice, iced tea sweetened with sugar, and flavored milk. ? Eat a healthy breakfast.  Drink enough water to keep your urine clear or pale yellow.  Do not go without eating for long periods of time (do not fast) or follow a fad diet. Fasting and fad diets can be unhealthy and even dangerous. Physical Activity  Exercise regularly, as told by your health care provider. Ask your health care provider what types of exercise are safe for you and how often you should exercise.  Warm up and stretch before being active.  Cool down and stretch after being active.  Rest between periods of activity. Lifestyle  Limit the time that you spend in front of your TV, computer, or video game system.  Find ways to reward yourself that do not involve food.  Limit alcohol intake to no more than 1 drink a day for  nonpregnant women and 2 drinks a day for men. One drink equals 12 oz of beer, 5 oz of wine, or 1 oz of hard liquor. General instructions  Keep a weight loss journal to keep track of the food you eat and how much you exercise you get.  Take over-the-counter and prescription medicines only as told by your health care provider.  Take vitamins and supplements only as told by your health care provider.  Consider joining a support group. Your health care provider may be able to recommend a support group.  Keep all follow-up visits as told by your health care provider. This is important. Contact a health care provider if:  You are unable to meet your weight loss goal after 6 weeks of dietary and lifestyle changes. This information is not intended to replace advice given to you by your health care provider. Make sure you discuss any questions you have with your health care provider. Document Released: 05/29/2004 Document Revised: 09/24/2015 Document Reviewed: 02/07/2015 Elsevier Interactive Patient Education  2019 Elsevier Inc.  Preventing Unhealthy [employer], Adult Staying at a healthy weight is important to your overall health. When fat builds up in your body, you may become overweight or obese. Being overweight or obese increases your risk of developing certain health problems, such as heart disease,  diabetes, sleeping problems, joint problems, and some types of cancer. Unhealthy weight gain is often the result of making unhealthy food choices or not getting enough exercise. You can make changes to your lifestyle to prevent obesity and stay as healthy as possible. What nutrition changes can be made?   Eat only as much as your body needs. To do this: ? Pay attention to signs that you are hungry or full. Stop eating as soon as you feel full. ? If you feel hungry, try drinking water first before eating. Drink enough water so your urine is clear or pale yellow. ? Eat smaller portions. Pay  attention to portion sizes when eating out. ? Look at serving sizes on food labels. Most foods contain more than one serving per container. ? Eat the recommended number of calories for your gender and activity level. For most active people, a daily total of 2,000 calories is appropriate. If you are trying to lose weight or are not very active, you may need to eat fewer calories. Talk with your health care provider or a diet and nutrition specialist (dietitian) about how many calories you need each day.  Choose healthy foods, such as: ? Fruits and vegetables. At each meal, try to fill at least half of your plate with fruits and vegetables. ? Whole grains, such as whole-wheat bread, brown rice, and quinoa. ? Lean meats, such as chicken or fish. ? Other healthy proteins, such as beans, eggs, or tofu. ? Healthy fats, such as nuts, seeds, fatty fish, and olive oil. ? Low-fat or fat-free dairy products.  Check food labels, and avoid food and drinks that: ? Are high in calories. ? Have added sugar. ? Are high in sodium. ? Have saturated fats or trans fats.  Cook foods in healthier ways, such as by baking, broiling, or grilling.  Make a meal plan for the week, and shop with a grocery list to help you stay on track with your purchases. Try to avoid going to the grocery store when you are hungry.  When grocery shopping, try to shop around the outside of the store first, where the fresh foods are. Doing this helps you to avoid prepackaged foods, which can be high in sugar, salt (sodium), and fat. What lifestyle changes can be made?   Exercise for 30 or more minutes on 5 or more days each week. Exercising may include brisk walking, yard work, biking, running, swimming, and team sports like basketball and soccer. Ask your health care provider which exercises are safe for you.  Do muscle-strengthening activities, such as lifting weights or using resistance bands, on 2 or more days a week.  Do not use  any products that contain nicotine or tobacco, such as cigarettes and e-cigarettes. If you need help quitting, ask your health care provider.  Limit alcohol intake to no more than 1 drink a day for nonpregnant women and 2 drinks a day for men. One drink equals 12 oz of beer, 5 oz of wine, or 1 oz of hard liquor.  Try to get 7-9 hours of sleep each night. What other changes can be made?  Keep a food and activity journal to keep track of: ? What you ate and how many calories you had. Remember to count the calories in sauces, dressings, and side dishes. ? Whether you were active, and what exercises you did. ? Your calorie, weight, and activity goals.  Check your weight regularly. Track any changes. If you notice you have gained  weight, make changes to your diet or activity routine.  Avoid taking weight-loss medicines or supplements. Talk to your health care provider before starting any new medicine or supplement.  Talk to your health care provider before trying any new diet or exercise plan. Why are these changes important? Eating healthy, staying active, and having healthy habits can help you to prevent obesity. Those changes also:  Help you manage stress and emotions.  Help you connect with friends and family.  Improve your self-esteem.  Improve your sleep.  Prevent long-term health problems. What can happen if changes are not made? Being obese or overweight can cause you to develop joint or bone problems, which can make it hard for you to stay active or do activities you enjoy. Being obese or overweight also puts stress on your heart and lungs and can lead to health problems like diabetes, heart disease, and some cancers. Where to find more information Talk with your health care provider or a dietitian about healthy eating and healthy lifestyle choices. You may also find information from:  U.[place]. Department of Agriculture, MyPlate: FormerBoss.no  American Heart Association:  www.heart.org  Centers for Disease Control and Prevention: http://www.wolf.info/ Summary  Staying at a healthy weight is important to your overall health. It helps you to prevent certain diseases and health problems, such as heart disease, diabetes, joint problems, sleep disorders, and some types of cancer.  Being obese or overweight can cause you to develop joint or bone problems, which can make it hard for you to stay active or do activities you enjoy.  You can prevent unhealthy weight gain by eating a healthy diet, exercising regularly, not smoking, limiting alcohol, and getting enough sleep.  Talk with your health care provider or a dietitian for guidance about healthy eating and healthy lifestyle choices. This information is not intended to replace advice given to you by your health care provider. Make sure you discuss any questions you have with your health care provider. Document Released: 04/22/2016 Document Revised: 01/30/2017 Document Reviewed: 05/28/2016 Elsevier Interactive Patient Education  2019 [employer].

## 2018-05-14 NOTE — Assessment & Plan Note (Signed)
Well-controlled; so proud of patient's efforts at eating better and losing weight

## 2018-05-14 NOTE — Progress Notes (Signed)
Marissa Nelson, please let the patient know that her urine did show protein. She'll be seeing the kidney doctor soon and he will probably get further testing on her kidneys. Thank you

## 2018-06-14 ENCOUNTER — Other Ambulatory Visit: Payer: Self-pay

## 2018-06-14 DIAGNOSIS — D0511 Intraductal carcinoma in situ of right breast: Secondary | ICD-10-CM

## 2018-06-30 DIAGNOSIS — R809 Proteinuria, unspecified: Secondary | ICD-10-CM | POA: Diagnosis not present

## 2018-06-30 DIAGNOSIS — I129 Hypertensive chronic kidney disease with stage 1 through stage 4 chronic kidney disease, or unspecified chronic kidney disease: Secondary | ICD-10-CM | POA: Diagnosis not present

## 2018-06-30 DIAGNOSIS — N183 Chronic kidney disease, stage 3 (moderate): Secondary | ICD-10-CM | POA: Diagnosis not present

## 2018-07-05 ENCOUNTER — Other Ambulatory Visit: Payer: Self-pay | Admitting: Nephrology

## 2018-07-05 ENCOUNTER — Other Ambulatory Visit (HOSPITAL_COMMUNITY): Payer: Self-pay | Admitting: Nephrology

## 2018-07-05 DIAGNOSIS — N183 Chronic kidney disease, stage 3 unspecified: Secondary | ICD-10-CM

## 2018-07-13 ENCOUNTER — Ambulatory Visit
Admission: RE | Admit: 2018-07-13 | Discharge: 2018-07-13 | Disposition: A | Discharge status: Home / Self Care | Payer: BC Managed Care – PPO | Source: Ambulatory Visit | Attending: Surgery | Admitting: Surgery

## 2018-07-13 DIAGNOSIS — D0511 Intraductal carcinoma in situ of right breast: Secondary | ICD-10-CM

## 2018-07-20 ENCOUNTER — Other Ambulatory Visit: Payer: Self-pay

## 2018-07-20 ENCOUNTER — Ambulatory Visit (INDEPENDENT_AMBULATORY_CARE_PROVIDER_SITE_OTHER): Payer: BC Managed Care – PPO | Admitting: Surgery

## 2018-07-20 ENCOUNTER — Encounter: Payer: Self-pay | Admitting: Surgery

## 2018-07-20 VITALS — BP 134/83 | HR 65 | Temp 97.0°F | Resp 14 | Ht 62.0 in | Wt 175.8 lb

## 2018-07-20 DIAGNOSIS — D0511 Intraductal carcinoma in situ of right breast: Secondary | ICD-10-CM | POA: Diagnosis not present

## 2018-07-20 NOTE — Patient Instructions (Addendum)
The patient is aware to call back for any questions or new concerns. The patient has been asked to return to the office in September with a bilateral diagnostic mammogram.

## 2018-07-20 NOTE — Progress Notes (Signed)
Surgical Clinic Progress/Follow-up Note   HPI:  68 y.o. 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 Patient reports she feels well, has experienced only a single episode of transient mild Right breast peri-incisional pain over the past 6 months that resolved immediately without intervention and has not since recurred. She otherwise denies nipple discharge, fever/chills, unintentional weight loss, CP, or SOB. Lastly, she states that she typically undergoes her annual B/L mammograms in September and expresses her preference to continue doing so.  Review of Systems:  Constitutional: denies any other weight loss, fever, chills, or sweats  Eyes: denies any other vision changes, history of eye injury  ENT: denies sore throat, hearing problems  Respiratory: denies shortness of breath, wheezing  Cardiovascular: denies chest pain, palpitations Breasts: pain, nipple drainage, and palpable mass(es) as per interval history Gastrointestinal: denies abdominal pain, N/V, or diarrhea Musculoskeletal: denies any other joint pains or cramps  Skin: Denies any other rashes or skin discolorations  Neurological: denies any other headache, dizziness, weakness  Psychiatric: denies any other depression, anxiety  All other review of systems: otherwise negative   Vital Signs:  BP 134/83   Pulse 65   Temp (!) 97 F (36.1 C) (Temporal)   Resp 14   Ht 5\' 2"  (1.575 m)   Wt 175 lb 12.8 oz (79.7 kg)   SpO2 97%   BMI 32.15 kg/m    Physical Exam:  Constitutional:  -- Overweight body habitus  -- Awake, alert, and oriented x3  Eyes:  -- Pupils equally round and reactive to light  -- No scleral icterus  Ear, nose, throat:  -- No jugular venous distension  -- No nasal drainage, bleeding Pulmonary:  -- No crackles -- Equal breath sounds bilaterally -- Breathing non-labored at rest Breast: -- Mild focal  hyperpigmentation of Right breast post-surgical incision site following post-surgical radiation -- No palpable mass or nipple drainage appreciated bilaterally -- No axillary lymphadenopathy appreciated bilaterally Cardiovascular:  -- S1, S2 present  -- No pericardial rubs  Gastrointestinal:  -- Soft, nontender, non-distended, no guarding/rebound  -- No abdominal masses appreciated, pulsatile or otherwise  Musculoskeletal / Integumentary:  -- Wounds or skin discoloration: None appreciated except as described above (Breast)  -- Extremities: B/L UE and LE FROM, hands and feet warm, no edema  Neurologic:  -- Motor function: intact and symmetric  -- Sensation: intact and symmetric   Imaging:  Right Breast Diagnostic Mammogram (07/13/2018) ACR Breast Density Category a: The breast tissue is almost entirely fatty.  Post operative changes are seen in the RIGHT breast. Residual FX and cylinder clips overlie the seroma cavity and mark the sites of previous benign biopsies. No suspicious mass, distortion, or microcalcifications are identified to suggest presence of malignancy.  No mammographic evidence for malignancy.  BI-RADS CATEGORY 2: Benign  Assessment:  68 y.o. yo Female with a problem list including...  Patient Active Problem List   Diagnosis Date Noted  . Obesity (BMI 30.0-34.9) 02/24/2018  . Vitamin B12 deficiency 05/07/2017  . 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 Positive ANA (antinuclear antibody) 10/04/2016  . Arthralgia 10/04/2016  . Prediabetes 06/20/2016  . Hx of right breast biopsy 01/17/2016  . Medication monitoring encounter 11/15/2015  . Preventative health care 11/15/2015  . Chronic kidney disease, stage III (moderate) ([place]) 11/15/2015  . Vitamin D deficiency 10/29/2015  . HLD (hyperlipidemia) 06/15/2015  . Obstructive apnea 06/15/2015  .  Chronic left shoulder pain 11/08/2014  . Numbness and tingling in left hand 11/07/2014  . 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  Plan:              - discussed with patient results of recent breast imaging             - instructions for routine breast exams discussed and reviewed             - return to clinic following bilateral diagnostic mammogram in 6 months, which will restore patient's annual mammogram, after which will continue with annual screening mammograms             - instructed to call office if any questions or concerns  All of the above recommendations were discussed with the patient, and all of patient's questions were answered to her expressed satisfaction.  -- Marilynne Drivers Rosana Hoes, MD, [place]: [facility] exceptional care. Office: [phone]

## 2018-07-21 ENCOUNTER — Encounter: Payer: Self-pay | Admitting: Surgery

## 2018-08-09 ENCOUNTER — Encounter: Payer: Self-pay | Admitting: *Deleted

## 2018-08-09 ENCOUNTER — Telehealth: Payer: Self-pay | Admitting: *Deleted

## 2018-08-09 NOTE — Telephone Encounter (Signed)
Patient wanted Dr. Rosana Hoes to take out her out of work due to having radiation. Advised patient that Dr. Baruch Gouty office would have to handle that.

## 2018-08-09 NOTE — Telephone Encounter (Signed)
Patient call and wanted a letter staying that she has a medical condition that requires her to be out of work, patient needs it for her work.

## 2018-08-30 ENCOUNTER — Ambulatory Visit: Payer: BC Managed Care – PPO | Attending: Nephrology

## 2018-10-08 ENCOUNTER — Ambulatory Visit: Payer: BC Managed Care – PPO | Admitting: Oncology

## 2018-10-20 ENCOUNTER — Other Ambulatory Visit: Payer: Self-pay

## 2018-10-21 ENCOUNTER — Encounter: Payer: Self-pay | Admitting: Oncology

## 2018-10-21 ENCOUNTER — Inpatient Hospital Stay: Payer: BC Managed Care – PPO | Attending: Oncology | Admitting: Oncology

## 2018-10-21 DIAGNOSIS — Z170 Estrogen receptor positive status [ER+]: Secondary | ICD-10-CM | POA: Diagnosis not present

## 2018-10-21 DIAGNOSIS — D0511 Intraductal carcinoma in situ of right breast: Secondary | ICD-10-CM | POA: Diagnosis not present

## 2018-10-21 DIAGNOSIS — Z5181 Encounter for therapeutic drug level monitoring: Secondary | ICD-10-CM

## 2018-10-21 DIAGNOSIS — Z79811 Long term (current) use of aromatase inhibitors: Secondary | ICD-10-CM | POA: Diagnosis not present

## 2018-10-21 DIAGNOSIS — Z923 Personal history of irradiation: Secondary | ICD-10-CM

## 2018-10-21 DIAGNOSIS — Z08 Encounter for follow-up examination after completed treatment for malignant neoplasm: Secondary | ICD-10-CM

## 2018-10-21 MED ORDER — ANASTROZOLE 1 MG PO TABS: 1.0000 mg | ORAL_TABLET | Freq: Every day | ORAL | 3 refills | Status: DC

## 2018-10-21 NOTE — Progress Notes (Signed)
I connected with Marissa Nelson on 10/21/18 at 11:00 AM EDT by video enabled telemedicine visit and verified that I am speaking with the correct person using two identifiers.   I discussed the limitations, risks, security and privacy concerns of performing an evaluation and management service by telemedicine and the availability of in-person appointments. I also discussed with the patient that there may be a patient responsible charge related to this service. The patient expressed understanding and agreed to proceed.  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 Patient is a 68 year old African-American female with no significant comorbidities other than hypertension. She underwent screening bilateral mammogram in August 2017 which showed calcifications in her right breast. This was followed by a diagnostic mammogram which showed 2 sets of calcifications: 1 was 4 mm in the upper outer right breast and an additional 7 mm calcification in the central subareolar right breast. The 4 mm group of calcifications was biopsies and wasnegative for malignancy. Repeat mammogram was recommended in 6 months. This mammogram showed a new 15 mm calcifications in the upper outer quadrant of the right breast measuring 12 x 15 x 13 mm. Stable group of benign-appearing calcifications in the right breast upper outer quadrant measuring 5 mm.  2. Patient underwent 2 sets of biopsies. Biopsies from the right upper outer quadrant showed intermediate grade DCIS with comedonecrosis and associated calcifications. ER greater than 90% positive and PR was 11-50% positive. Biopsy of the right lower quadrant of the right breast showed fibroadenomatous changes with calcifications.  3. Patient has been seen by  Dr. Layla Maw is scheduled to undergo lumpectomy on 01/30/2017  4. Patient had menarche at the age of 45. 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 History of breast cancer in one of her sisters. She is G2 P2 L2. She did not breast-feed.  5.Patient underwent lumpectomy on 01/30/2017 which showed a 15 mm DCIS, grade 2 with comedonecrosis. Margins were -3 mm from the closest medial margin lymph nodes were not submitted. Pathology stagepTisNx.ER greater than 90% positive PR 11-50% positive  6. Patient completed adjuvant RT and started arimidex in jan 2019  Interval history she is tolerating Arimidex along with calcium and vitamin D well.  Denies any side effects at this time.  Her appetite is good and her weight is stable.  Denies any new aches and pains anywhere   Review of Systems  Constitutional: Negative for chills, fever, malaise/fatigue and weight loss.  HENT: Negative for congestion, ear discharge and nosebleeds.   Eyes: Negative for blurred vision.  Respiratory: Negative for cough, hemoptysis, sputum production, shortness of breath and wheezing.   Cardiovascular: Negative for chest pain, palpitations, orthopnea and claudication.  Gastrointestinal: Negative for abdominal pain, blood in stool, constipation, diarrhea, heartburn, melena, nausea and vomiting.  Genitourinary: Negative for dysuria, flank pain, frequency, hematuria and urgency.  Musculoskeletal: Negative for back pain, joint pain and myalgias.  Skin: Negative for rash.  Neurological: Negative for dizziness, tingling, focal weakness, seizures, weakness and headaches.  Endo/Heme/Allergies: Does not bruise/bleed easily.  Psychiatric/Behavioral: Negative for depression and suicidal ideas. The patient does not have insomnia.     Allergies  Allergen Reactions  . Shellfish  Allergy Swelling    angioedema  .  Penicillin G Rash    Has patient had a PCN reaction causing immediate rash, facial/tongue/throat swelling, SOB or lightheadedness with hypotension: Yes Has patient had a PCN reaction causing severe rash involving mucus membranes or skin necrosis: Yes Has patient had a PCN reaction that required hospitalization: No Has patient had a PCN reaction occurring within the last 10 years: No If all of the above answers are "NO", then may proceed with Cephalosporin use.     Past Medical History:  Diagnosis Date  . Abnormal mammogram of right breast 01/17/2016   Recommendation: Six month follow-up diagnostic mammogram of the right breast for an additional group of probably benign Calcifications. Sept 2017 --> due March 2018  . Arthralgia 10/04/2016  . Arthritis    hands  . Benign essential HTN 12/07/2006  . Breast cancer ([place]) 2018   right breast DCIS with rad tx  . Breast cancer screening 11/15/2015  . Chronic kidney disease, stage II (mild) 11/15/2015  . Chronic left shoulder pain 11/08/2014  . 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 Family history of diabetes mellitus 06/12/2016   Mother and several other relatives  . HLD (hyperlipidemia) 06/15/2015  . Hx of right breast biopsy 01/17/2016  . Hyperlipidemia   . Hypertension   . Medication monitoring encounter 11/15/2015  . Motion sickness    ocean ships  . Numbness and tingling in left hand 11/07/2014  . Obesity 10/25/2015  . Obesity (BMI 30.0-34.9) 10/25/2015  . Personal history of radiation therapy 2018   right breast ca  . Positive ANA (antinuclear antibody) 10/04/2016  . Prediabetes   . Preventative health care 11/15/2015  . Vitamin D deficiency 10/29/2015    Past Surgical History:  Procedure Laterality Date  . ABDOMINAL HYSTERECTOMY    . BREAST BIOPSY Right 01/15/2016    cylinder shape calcs UOQ, FIBROADENOMATOUS  . BREAST BIOPSY Right 12/30/2016   stereo bx for calcifications: DCIS UOQ, ribbon shaped  .  BREAST BIOPSY Right 01/08/2017   LOQ x shaped, FIBROADENOMATOUS CHANGE WITH  . BREAST LUMPECTOMY Right 01/30/2018   DCIS clear margins  . BREAST LUMPECTOMY WITH NEEDLE LOCALIZATION Right 01/30/2017   Procedure: BREAST LUMPECTOMY WITH NEEDLE LOCALIZATION;  Surgeon: Vickie Epley, MD;  Location: [facility];  Service: General;  Laterality: Right;  . COLONOSCOPY WITH PROPOFOL N/A 11/19/2016   Procedure: COLONOSCOPY WITH PROPOFOL;  Surgeon: Jonathon Bellows, MD;  Location: [place];  Service: Endoscopy;  Laterality: N/A;  . ESOPHAGOGASTRODUODENOSCOPY (EGD) WITH PROPOFOL N/A 11/19/2016   Procedure: ESOPHAGOGASTRODUODENOSCOPY (EGD) WITH PROPOFOL;  Surgeon: Jonathon Bellows, MD;  Location: [place];  Service: Endoscopy;  Laterality: N/A;  . GIVENS CAPSULE STUDY N/A 12/12/2016   Procedure: GIVENS CAPSULE STUDY;  Surgeon: Jonathon Bellows, MD;  Location: [facility];  Service: Gastroenterology;  Laterality: N/A;  . TOTAL MASTECTOMY Right 01/30/2017   Procedure: TOTAL MASTECTOMY;  Surgeon: Vickie Epley, MD;  Location: [facility];  Service: General;  Laterality: Right;    Social History   Socioeconomic History  . Marital status: Married    Spouse name: Not on file  . Number of children: 2  . Years of education: Not on file  . Highest education level: Not on file  Occupational History  . Occupation: Works in [employer] [place], [place])    Employer: [employer]  . Financial resource strain: Not on file  .  Food insecurity    Worry: Not on file    Inability: Not on file  . Transportation needs    Medical: Not on file    Non-medical: Not on file  Tobacco Use  . Smoking status: Never Smoker  . Smokeless tobacco: Never Used  Substance and Sexual Activity  . Alcohol use: No    Alcohol/week: 0.0 standard drinks  . Drug use: No  . Sexual activity: Not Currently  Lifestyle  . Physical activity    Days per week: Not on file    Minutes per session:  Not on file  . Stress: Not on file  Relationships  . Social Herbalist on phone: Not on file    Gets together: Not on file    Attends religious service: Not on file    Active member of club or organization: Not on file    Attends meetings of clubs or organizations: Not on file    Relationship status: Not on file  . Intimate partner violence    Fear of current or ex partner: Not on file    Emotionally abused: Not on file    Physically abused: Not on file    Forced sexual activity: Not on file  Other Topics Concern  . Not on file  Social History Narrative  . Not on file    Family History  Problem Relation Age of Onset  . Cancer Mother 13       liver cancer  . Diabetes Mother   . Hypertension Mother   . Glomerulonephritis Father   . Diabetes Sister   . Glomerulonephritis Sister   . Cancer Sister 96       throat cancer  . Heart disease Sister   . Diabetes Sister   . Diabetes Sister   . COPD Sister   . Diabetes Sister   . Breast cancer Cousin        Currently undergoing systemic chemotherapy (01/2017)  . Varicose Veins Neg Hx      Current Outpatient Medications:  .  acetaminophen (TYLENOL) 325 MG tablet, Take 650 mg by mouth every 6 (six) hours as needed for mild pain or headache. , Disp: , Rfl:  .  anastrozole (ARIMIDEX) 1 MG tablet, Take 1 tablet (1 mg total) by mouth daily., Disp: 90 tablet, Rfl: 3 .  atorvastatin (LIPITOR) 20 MG tablet, Take 1 tablet (20 mg total) by mouth at bedtime., Disp: 90 tablet, Rfl: 1 .  calcium carbonate (CALCIUM 600) 600 MG TABS tablet, Take 600 mg by mouth 2 (two) times daily with a meal., Disp: , Rfl:  .  Cholecalciferol (VITAMIN D3) 50 MCG (2000 UT) TABS, Take 1 tablet by mouth daily., Disp: , Rfl:  .  Cyanocobalamin (VITAMIN B-12) 500 MCG SUBL, Place under the tongue daily. , Disp: , Rfl:  .  metoprolol succinate (TOPROL-XL) 25 MG 24 hr tablet, Take 1 tablet (25 mg total) by mouth daily., Disp: 90 tablet, Rfl: 0 .   triamterene-hydrochlorothiazide (MAXZIDE-25) 37.5-25 MG tablet, Take 1 tablet by mouth daily., Disp: 90 tablet, Rfl: 3  No results found.  No images are attached to the encounter.   CMP Latest Ref Rng & Units 05/12/2018  Glucose 65 - 99 mg/dL 86  BUN 7 - 25 mg/dL 28(H)  Creatinine 0.50 - 0.99 mg/dL 1.37(H)  Sodium 135 - 146 mmol/L 140  Potassium 3.5 - 5.3 mmol/L 4.4  Chloride 98 - 110 mmol/L 102  CO2 20 - 32 mmol/L 27  Calcium 8.6 - 10.4 mg/dL 9.8  Total Protein 6.1 - 8.1 g/dL 7.1  Total Bilirubin 0.2 - 1.2 mg/dL 0.3  Alkaline Phos 38 - 126 U/L -  AST 10 - 35 U/L 19  ALT 6 - 29 U/L 20   CBC Latest Ref Rng & Units 04/14/2017  WBC 3.6 - 11.0 K/uL 5.0  Hemoglobin 12.0 - 16.0 g/dL 12.0  Hematocrit 35.0 - 47.0 % 35.5  Platelets 150 - 440 K/uL 217     Observation/objective: Appears in no acute distress of a video visit today.  Breathing is nonlabored  Assessment and plan: Patient is a 68 year old female with a history of right breast DCIS status post lumpectomy and adjuvant radiation therapy currently on Arimidex since January 2019.  This is a routine follow-up visit  Overall patient is tolerating her Arimidex well without any significant side effects.  Recent mammogram from March 2020 on the right side did not show any evidence of malignancy.  She is due for a bilateral diagnostic mammogram in September 2020 which has been scheduled by Dr. Rosana Hoes.  Follow-up instructions: I will see her back in 6 months.  No labs  I discussed the assessment and treatment plan with the patient. The patient was provided an opportunity to ask questions and all were answered. The patient agreed with the plan and demonstrated an understanding of the instructions.   The patient was advised to call back or seek an in-person evaluation if the symptoms worsen or if the condition fails to improve as anticipated.   Visit Diagnosis: 1. Visit for monitoring Arimidex therapy   2. Encounter for follow-up  surveillance of breast cancer     Dr. Randa Evens, MD, MPH [facility] Pager- [phone] 10/21/2018 11:15 AM

## 2018-10-21 NOTE — Progress Notes (Signed)
Pt states that she does her monthly breast exams-no abnormal findings. Do good on arimidex. Takes 600mg  calcium daily and I told her to take 2 to get the thr total mg that she needs. She will start taking them 1 in am and 1 in pm today

## 2018-11-16 ENCOUNTER — Ambulatory Visit: Payer: BC Managed Care – PPO | Admitting: Nurse Practitioner

## 2018-11-24 ENCOUNTER — Encounter: Payer: Self-pay | Admitting: Nurse Practitioner

## 2018-11-24 ENCOUNTER — Ambulatory Visit (INDEPENDENT_AMBULATORY_CARE_PROVIDER_SITE_OTHER): Payer: BC Managed Care – PPO | Admitting: Nurse Practitioner

## 2018-11-24 VITALS — BP 125/83 | HR 90 | Temp 98.6°F | Resp 16

## 2018-11-24 DIAGNOSIS — N183 Chronic kidney disease, stage 3 unspecified: Secondary | ICD-10-CM

## 2018-11-24 DIAGNOSIS — I129 Hypertensive chronic kidney disease with stage 1 through stage 4 chronic kidney disease, or unspecified chronic kidney disease: Secondary | ICD-10-CM | POA: Diagnosis not present

## 2018-11-24 DIAGNOSIS — E782 Mixed hyperlipidemia: Secondary | ICD-10-CM

## 2018-11-24 DIAGNOSIS — I10 Essential (primary) hypertension: Secondary | ICD-10-CM

## 2018-11-24 DIAGNOSIS — G4733 Obstructive sleep apnea (adult) (pediatric): Secondary | ICD-10-CM

## 2018-11-24 DIAGNOSIS — R7303 Prediabetes: Secondary | ICD-10-CM | POA: Diagnosis not present

## 2018-11-24 DIAGNOSIS — C50911 Malignant neoplasm of unspecified site of right female breast: Secondary | ICD-10-CM | POA: Diagnosis not present

## 2018-11-24 DIAGNOSIS — E559 Vitamin D deficiency, unspecified: Secondary | ICD-10-CM

## 2018-11-24 MED ORDER — TRIAMTERENE-HCTZ 37.5-25 MG PO TABS: 1.0000 | ORAL_TABLET | Freq: Every day | ORAL | 1 refills | Status: DC

## 2018-11-24 MED ORDER — METOPROLOL SUCCINATE ER 25 MG PO TB24: 25.0000 mg | ORAL_TABLET | Freq: Every day | ORAL | 1 refills | Status: DC

## 2018-11-24 MED ORDER — ATORVASTATIN CALCIUM 20 MG PO TABS: 20.0000 mg | ORAL_TABLET | Freq: Every day | ORAL | 1 refills | Status: DC

## 2018-11-24 NOTE — Progress Notes (Signed)
Virtual Visit via Video Note  I connected with Marissa Nelson on 11/24/18 at 10:20 AM EDT by a video enabled telemedicine application and verified that I am speaking with the correct person using two identifiers.   Staff discussed the limitations of evaluation and management by telemedicine and the availability of in person appointments. The patient expressed understanding and agreed to proceed.  Patient location: home  My location: work office Other people present: none HPI  Hypertension Patient is on maxide and metoprolol 25mg  daily.  Takes medications as prescribed with no missed doses a month.   she has been more compliant with low-salt diet and using herbs instead for seasoning.  Denies chest pain, headaches, blurry vision.  BP Readings from Last 3 Encounters:  11/24/18 125/83  07/20/18 134/83  05/14/18 122/78    Hyperlipidemia Patient rx  atorvastatin 20mg  daily. Takes medications as prescribed with no missed doses.  Denies myalgias Lab Results  Component Value Date   CHOL 220 (H) 05/12/2018   HDL 56 05/12/2018   LDLCALC 146 (H) 05/12/2018   TRIG 80 05/12/2018   CHOLHDL 3.9 05/12/2018   Sleep apnea States her insurance would not pay for her CPAP, last sleep study was over 10 years ago. States husband notices that she snores and has apnic episodes. She has interrupted sleep maybe 4 hours at time, takes naps through out the days fatigued  Prediabetes Diet: has been watching what she eats- portion controlled. Normally has a bowl of special K, cutting out bread and added sugar. Eating a lot more baked meats and [place] and chicken. Cut out potato chips and icecream.  Has been walking every day is up to a mile and a half. Lab Results  Component Value Date   HGBA1C 6.1 (H) 05/12/2018   Wt Readings from Last 3 Encounters:  07/20/18 175 lb 12.8 oz (79.7 kg)  05/14/18 175 lb 8 oz (79.6 kg)  04/06/18 180 lb 8 oz (81.9 kg)    Breast cancer She had lumpectomy of right  breast in 2018, completed radiation therapy and was started on armidex in 05/2017 which she is still taking. She is followed by oncologist Dr. Janese Banks, last seen on 10/21/2018  She is taking vitamin D, calcium and Vitamin B12 sublingual supplementation   PHQ2/9: Depression screen [facility] 2/9 11/24/2018 05/14/2018 02/24/2018 02/24/2018 11/04/2017  Decreased Interest 0 0 0 0 0  Down, Depressed, Hopeless 0 0 0 0 0  PHQ - 2 Score 0 0 0 0 0  Altered sleeping 0 0 0 - -  Tired, decreased energy 0 0 0 - -  Change in appetite 0 0 0 - -  Feeling bad or failure about yourself  0 0 0 - -  Trouble concentrating 0 0 0 - -  Moving slowly or fidgety/restless 0 0 0 - -  Suicidal thoughts 0 0 0 - -  PHQ-9 Score 0 0 0 - -  Difficult doing work/chores Not difficult at all Not difficult at all Not difficult at all - -     PHQ reviewed. Negative  Patient Active Problem List   Diagnosis Date Noted  . Obesity (BMI 30.0-34.9) 02/24/2018  . Vitamin B12 deficiency 05/07/2017  . 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 Arthralgia 10/04/2016  . Prediabetes 06/20/2016  . Chronic kidney disease, stage III (moderate) ([place]) 11/15/2015  . Vitamin D deficiency 10/29/2015  . HLD (hyperlipidemia) 06/15/2015  . Obstructive apnea 06/15/2015  . Chronic left shoulder pain 11/08/2014  .  Numbness and tingling in left hand 11/07/2014  . Benign essential HTN 12/07/2006    Past Medical History:  Diagnosis Date  . Abnormal mammogram of right breast 01/17/2016   Recommendation: Six month follow-up diagnostic mammogram of the right breast for an additional group of probably benign Calcifications. Sept 2017 --> due March 2018  . Arthralgia 10/04/2016  . Arthritis    hands  . Benign essential HTN 12/07/2006  . Breast cancer ([place]) 2018   right breast DCIS with rad tx  . Breast cancer screening 11/15/2015  . Chronic kidney disease, stage II (mild) 11/15/2015  . Chronic left shoulder pain 11/08/2014  . DCIS (ductal carcinoma  in situ) of breast 12/31/2016   RIGHT, August 2018  . Family history of diabetes mellitus 06/12/2016   Mother and several other relatives  . HLD (hyperlipidemia) 06/15/2015  . Hx of right breast biopsy 01/17/2016  . Hyperlipidemia   . Hypertension   . Medication monitoring encounter 11/15/2015  . Motion sickness    ocean ships  . Numbness and tingling in left hand 11/07/2014  . Obesity 10/25/2015  . Obesity (BMI 30.0-34.9) 10/25/2015  . Personal history of radiation therapy 2018   right breast ca  . Positive ANA (antinuclear antibody) 10/04/2016  . Prediabetes   . Preventative health care 11/15/2015  . Vitamin D deficiency 10/29/2015    Past Surgical History:  Procedure Laterality Date  . ABDOMINAL HYSTERECTOMY    . BREAST BIOPSY Right 01/15/2016    cylinder shape calcs UOQ, FIBROADENOMATOUS  . BREAST BIOPSY Right 12/30/2016   stereo bx for calcifications: DCIS UOQ, ribbon shaped  . BREAST BIOPSY Right 01/08/2017   LOQ x shaped, FIBROADENOMATOUS CHANGE WITH  . BREAST LUMPECTOMY Right 01/30/2018   DCIS clear margins  . BREAST LUMPECTOMY WITH NEEDLE LOCALIZATION Right 01/30/2017   Procedure: BREAST LUMPECTOMY WITH NEEDLE LOCALIZATION;  Surgeon: Vickie Epley, MD;  Location: [facility];  Service: General;  Laterality: Right;  . COLONOSCOPY WITH PROPOFOL N/A 11/19/2016   Procedure: COLONOSCOPY WITH PROPOFOL;  Surgeon: Jonathon Bellows, MD;  Location: [place];  Service: Endoscopy;  Laterality: N/A;  . ESOPHAGOGASTRODUODENOSCOPY (EGD) WITH PROPOFOL N/A 11/19/2016   Procedure: ESOPHAGOGASTRODUODENOSCOPY (EGD) WITH PROPOFOL;  Surgeon: Jonathon Bellows, MD;  Location: [place];  Service: Endoscopy;  Laterality: N/A;  . GIVENS CAPSULE STUDY N/A 12/12/2016   Procedure: GIVENS CAPSULE STUDY;  Surgeon: Jonathon Bellows, MD;  Location: [facility];  Service: Gastroenterology;  Laterality: N/A;  . TOTAL MASTECTOMY Right 01/30/2017   Procedure: TOTAL MASTECTOMY;  Surgeon: Vickie Epley, MD;   Location: [facility];  Service: General;  Laterality: Right;    Social History   Tobacco Use  . Smoking status: Never Smoker  . Smokeless tobacco: Never Used  Substance Use Topics  . Alcohol use: No    Alcohol/week: 0.0 standard drinks     Current Outpatient Medications:  .  acetaminophen (TYLENOL) 325 MG tablet, Take 650 mg by mouth every 6 (six) hours as needed for mild pain or headache. , Disp: , Rfl:  .  anastrozole (ARIMIDEX) 1 MG tablet, Take 1 tablet (1 mg total) by mouth daily., Disp: 90 tablet, Rfl: 3 .  atorvastatin (LIPITOR) 20 MG tablet, Take 1 tablet (20 mg total) by mouth at bedtime., Disp: 90 tablet, Rfl: 1 .  calcium carbonate (CALCIUM 600) 600 MG TABS tablet, Take 600 mg by mouth 2 (two) times daily with a meal., Disp: , Rfl:  .  Cholecalciferol (VITAMIN D3) 50 MCG (  2000 UT) TABS, Take 1 tablet by mouth daily., Disp: , Rfl:  .  Cyanocobalamin (VITAMIN B-12) 500 MCG SUBL, Place under the tongue daily. , Disp: , Rfl:  .  metoprolol succinate (TOPROL-XL) 25 MG 24 hr tablet, Take 1 tablet (25 mg total) by mouth daily., Disp: 90 tablet, Rfl: 0 .  triamterene-hydrochlorothiazide (MAXZIDE-25) 37.5-25 MG tablet, Take 1 tablet by mouth daily., Disp: 90 tablet, Rfl: 3  Allergies  Allergen Reactions  . Shellfish Allergy Swelling    angioedema  . Penicillin G Rash    Has patient had a PCN reaction causing immediate rash, facial/tongue/throat swelling, SOB or lightheadedness with hypotension: Yes Has patient had a PCN reaction causing severe rash involving mucus membranes or skin necrosis: Yes Has patient had a PCN reaction that required hospitalization: No Has patient had a PCN reaction occurring within the last 10 years: No If all of the above answers are "NO", then may proceed with Cephalosporin use.     ROS   No other specific complaints in a complete review of systems (except as listed in HPI above).  Objective  Vitals:   11/24/18 0945 11/24/18 1033  BP: (!)  156/78 125/83  Pulse:  90  Resp:  16  Temp: 98.6 F (37 C)      There is no height or weight on file to calculate BMI.  Nursing Note and Vital Signs reviewed.  Physical Exam   Constitutional: Patient appears well-developed and well-nourished. No distress.  HENT: Head: Normocephalic and atraumatic. Cardiovascular: Normal rate Pulmonary/Chest: Effort normal  Musculoskeletal: Normal range of motion,  Neurological: alert and oriented, speech normal.  Psychiatric: Patient has a normal mood and affect. behavior is normal. Judgment and thought content normal.    Assessment & Plan  1. Benign essential HTN stable - triamterene-hydrochlorothiazide (MAXZIDE-25) 37.5-25 MG tablet; Take 1 tablet by mouth daily.  Dispense: 90 tablet; Refill: 1 - metoprolol succinate (TOPROL-XL) 25 MG 24 hr tablet; Take 1 tablet (25 mg total) by mouth daily.  Dispense: 90 tablet; Refill: 1  2. Obstructive apnea - Ambulatory referral to Sleep Studies  3. Chronic kidney disease, stage III (moderate) (HCC) Hydration, avoids NSAIDs  4. Mixed hyperlipidemia Discussed diet and goals of LDL- will recheck at routine follow-up  - atorvastatin (LIPITOR) 20 MG tablet; Take 1 tablet (20 mg total) by mouth at bedtime.  Dispense: 90 tablet; Refill: 1  5. Vitamin D deficiency Continue supplementation   6. Prediabetes Discussed diet and encouraged healthy eating and portion control    Follow Up Instructions:   4 months   I discussed the assessment and treatment plan with the patient. The patient was provided an opportunity to ask questions and all were answered. The patient agreed with the plan and demonstrated an understanding of the instructions.   The patient was advised to call back or seek an in-person evaluation if the symptoms worsen or if the condition fails to improve as anticipated.  I provided 21 minutes of non-face-to-face time during this encounter including chart review.   Fredderick Severance, NP

## 2018-11-30 ENCOUNTER — Other Ambulatory Visit: Payer: Self-pay | Admitting: *Deleted

## 2018-11-30 DIAGNOSIS — D0511 Intraductal carcinoma in situ of right breast: Secondary | ICD-10-CM

## 2018-12-20 ENCOUNTER — Other Ambulatory Visit: Payer: Self-pay | Admitting: Oncology

## 2019-01-03 DIAGNOSIS — R809 Proteinuria, unspecified: Secondary | ICD-10-CM | POA: Diagnosis not present

## 2019-01-03 DIAGNOSIS — N183 Chronic kidney disease, stage 3 (moderate): Secondary | ICD-10-CM | POA: Diagnosis not present

## 2019-01-03 DIAGNOSIS — I129 Hypertensive chronic kidney disease with stage 1 through stage 4 chronic kidney disease, or unspecified chronic kidney disease: Secondary | ICD-10-CM | POA: Diagnosis not present

## 2019-01-19 ENCOUNTER — Ambulatory Visit (INDEPENDENT_AMBULATORY_CARE_PROVIDER_SITE_OTHER): Payer: BC Managed Care – PPO

## 2019-01-19 ENCOUNTER — Other Ambulatory Visit: Payer: Self-pay

## 2019-01-19 DIAGNOSIS — Z23 Encounter for immunization: Secondary | ICD-10-CM

## 2019-01-21 ENCOUNTER — Ambulatory Visit: Payer: BC Managed Care – PPO | Attending: Neurology

## 2019-01-21 DIAGNOSIS — N183 Chronic kidney disease, stage 3 (moderate): Secondary | ICD-10-CM | POA: Diagnosis not present

## 2019-01-21 DIAGNOSIS — I129 Hypertensive chronic kidney disease with stage 1 through stage 4 chronic kidney disease, or unspecified chronic kidney disease: Secondary | ICD-10-CM | POA: Diagnosis not present

## 2019-01-21 DIAGNOSIS — G4733 Obstructive sleep apnea (adult) (pediatric): Secondary | ICD-10-CM | POA: Insufficient documentation

## 2019-01-21 DIAGNOSIS — G4730 Sleep apnea, unspecified: Secondary | ICD-10-CM | POA: Diagnosis not present

## 2019-01-21 DIAGNOSIS — R7303 Prediabetes: Secondary | ICD-10-CM | POA: Diagnosis not present

## 2019-01-24 ENCOUNTER — Other Ambulatory Visit: Payer: Self-pay

## 2019-01-24 ENCOUNTER — Ambulatory Visit
Admission: RE | Admit: 2019-01-24 | Discharge: 2019-01-24 | Disposition: A | Discharge status: Home / Self Care | Payer: BC Managed Care – PPO | Source: Ambulatory Visit | Attending: Nephrology | Admitting: Nephrology

## 2019-01-24 DIAGNOSIS — N183 Chronic kidney disease, stage 3 unspecified: Secondary | ICD-10-CM

## 2019-01-28 ENCOUNTER — Other Ambulatory Visit: Payer: BC Managed Care – PPO

## 2019-01-28 ENCOUNTER — Ambulatory Visit: Payer: BC Managed Care – PPO

## 2019-02-01 ENCOUNTER — Ambulatory Visit: Payer: BC Managed Care – PPO | Admitting: Surgery

## 2019-02-08 ENCOUNTER — Ambulatory Visit: Payer: BC Managed Care – PPO | Admitting: Surgery

## 2019-02-14 ENCOUNTER — Other Ambulatory Visit (INDEPENDENT_AMBULATORY_CARE_PROVIDER_SITE_OTHER): Payer: Self-pay | Admitting: Nephrology

## 2019-02-14 DIAGNOSIS — I129 Hypertensive chronic kidney disease with stage 1 through stage 4 chronic kidney disease, or unspecified chronic kidney disease: Secondary | ICD-10-CM

## 2019-02-15 ENCOUNTER — Ambulatory Visit (INDEPENDENT_AMBULATORY_CARE_PROVIDER_SITE_OTHER): Payer: BC Managed Care – PPO

## 2019-02-15 ENCOUNTER — Other Ambulatory Visit: Payer: Self-pay

## 2019-02-15 DIAGNOSIS — I129 Hypertensive chronic kidney disease with stage 1 through stage 4 chronic kidney disease, or unspecified chronic kidney disease: Secondary | ICD-10-CM

## 2019-02-21 DIAGNOSIS — Z20828 Contact with and (suspected) exposure to other viral communicable diseases: Secondary | ICD-10-CM | POA: Diagnosis not present

## 2019-02-28 ENCOUNTER — Ambulatory Visit
Admission: RE | Admit: 2019-02-28 | Discharge: 2019-02-28 | Disposition: A | Discharge status: Home / Self Care | Payer: BC Managed Care – PPO | Source: Ambulatory Visit | Attending: Surgery | Admitting: Surgery

## 2019-02-28 DIAGNOSIS — D0511 Intraductal carcinoma in situ of right breast: Secondary | ICD-10-CM | POA: Insufficient documentation

## 2019-03-01 ENCOUNTER — Other Ambulatory Visit: Payer: Self-pay | Admitting: Surgery

## 2019-03-01 DIAGNOSIS — R921 Mammographic calcification found on diagnostic imaging of breast: Secondary | ICD-10-CM

## 2019-03-01 DIAGNOSIS — R928 Other abnormal and inconclusive findings on diagnostic imaging of breast: Secondary | ICD-10-CM

## 2019-03-08 ENCOUNTER — Ambulatory Visit: Payer: BC Managed Care – PPO | Admitting: Surgery

## 2019-03-09 ENCOUNTER — Ambulatory Visit
Admission: RE | Admit: 2019-03-09 | Discharge: 2019-03-09 | Disposition: A | Discharge status: Home / Self Care | Payer: BC Managed Care – PPO | Source: Ambulatory Visit | Attending: Surgery | Admitting: Surgery

## 2019-03-09 DIAGNOSIS — R928 Other abnormal and inconclusive findings on diagnostic imaging of breast: Secondary | ICD-10-CM | POA: Diagnosis not present

## 2019-03-09 DIAGNOSIS — R921 Mammographic calcification found on diagnostic imaging of breast: Secondary | ICD-10-CM | POA: Insufficient documentation

## 2019-03-09 HISTORY — PX: BREAST BIOPSY: SHX20

## 2019-03-10 LAB — SURGICAL PATHOLOGY

## 2019-03-15 ENCOUNTER — Ambulatory Visit (INDEPENDENT_AMBULATORY_CARE_PROVIDER_SITE_OTHER): Payer: Medicare HMO | Admitting: Surgery

## 2019-03-15 ENCOUNTER — Encounter: Payer: Self-pay | Admitting: Surgery

## 2019-03-15 ENCOUNTER — Other Ambulatory Visit: Payer: Self-pay

## 2019-03-15 VITALS — BP 139/82 | HR 76 | Temp 98.3°F | Resp 15 | Ht 61.0 in | Wt 193.0 lb

## 2019-03-15 DIAGNOSIS — D0511 Intraductal carcinoma in situ of right breast: Secondary | ICD-10-CM | POA: Diagnosis not present

## 2019-03-15 NOTE — Progress Notes (Signed)
03/15/2019  History of Present Illness: Marissa Nelson is a 68 y.o. female s/p right breast lumpectomy for DCIS with Dr. Rosana Hoes on 01/30/17.  She completed radiation and is on Arimidex.  She had a mammogram on 02/28/19 which showed stable right breast post-op changes, but new left breast calcifications.  These were biopsied on 11/4 which showed benign tissue with fat necrosis and calcifications associated with fat necrosis, without any atypia or malignancy.  She presents today for follow up.  Denies any palpable masses, skin changes, or nipple changes.  Reports some occasional pulling/pain on the right breast that has been stable but no other changes.  Past Medical History: Past Medical History:  Diagnosis Date  . Abnormal mammogram of right breast 01/17/2016   Recommendation: Six month follow-up diagnostic mammogram of the right breast for an additional group of probably benign Calcifications. Sept 2017 --> due March 2018  . Arthralgia 10/04/2016  . Arthritis    hands  . Benign essential HTN 12/07/2006  . Breast cancer ([place]) 2018   right breast DCIS with rad tx  . Breast cancer screening 11/15/2015  . Chronic kidney disease, stage II (mild) 11/15/2015  . Chronic left shoulder pain 11/08/2014  . 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 Family history of diabetes mellitus 06/12/2016   Mother and several other relatives  . HLD (hyperlipidemia) 06/15/2015  . Hx of right breast biopsy 01/17/2016  . Hyperlipidemia   . Hypertension   . Medication monitoring encounter 11/15/2015  . Motion sickness    ocean ships  . Numbness and tingling in left hand 11/07/2014  . Obesity 10/25/2015  . Obesity (BMI 30.0-34.9) 10/25/2015  . Personal history of radiation therapy 2018   right breast ca  . Positive ANA (antinuclear antibody) 10/04/2016  . Prediabetes   . Preventative health care 11/15/2015  . Vitamin D deficiency 10/29/2015     Past Surgical History: Past Surgical  History:  Procedure Laterality Date  . ABDOMINAL HYSTERECTOMY    . BREAST BIOPSY Right 01/15/2016    cylinder shape calcs UOQ, FIBROADENOMATOUS  . BREAST BIOPSY Right 12/30/2016   stereo bx for calcifications: DCIS UOQ, ribbon shaped  . BREAST BIOPSY Right 01/08/2017   LOQ x shaped, FIBROADENOMATOUS CHANGE WITH  . BREAST BIOPSY Left 03/09/2019   calcs bx, x marker path pending  . BREAST LUMPECTOMY Right 01/30/2018   DCIS clear margins  . BREAST LUMPECTOMY WITH NEEDLE LOCALIZATION Right 01/30/2017   Procedure: BREAST LUMPECTOMY WITH NEEDLE LOCALIZATION;  Surgeon: Vickie Epley, MD;  Location: [facility];  Service: General;  Laterality: Right;  . COLONOSCOPY WITH PROPOFOL N/A 11/19/2016   Procedure: COLONOSCOPY WITH PROPOFOL;  Surgeon: Jonathon Bellows, MD;  Location: [facility];  Service: Endoscopy;  Laterality: N/A;  . ESOPHAGOGASTRODUODENOSCOPY (EGD) WITH PROPOFOL N/A 11/19/2016   Procedure: ESOPHAGOGASTRODUODENOSCOPY (EGD) WITH PROPOFOL;  Surgeon: Jonathon Bellows, MD;  Location: [place];  Service: Endoscopy;  Laterality: N/A;  . GIVENS CAPSULE STUDY N/A 12/12/2016   Procedure: GIVENS CAPSULE STUDY;  Surgeon: Jonathon Bellows, MD;  Location: [facility] END[facility];  Service: Gastroenterology;  Laterality: N/A;  . TOTAL MASTECTOMY Right 01/30/2017   Error    Home Medications: Prior to Admission medications   Medication Sig Start Date End Date Taking? Authorizing Provider  acetaminophen (TYLENOL) 325 MG tablet Take 650 mg by mouth every 6 (six) hours as needed for mild pain or headache.    Yes [provider]  anastrozole (ARIMIDEX)  1 MG tablet TAKE ONE TABLET BY MOUTH EVERY DAY 12/20/18  Yes Sindy Guadeloupe, MD  atorvastatin (LIPITOR) 20 MG tablet Take 1 tablet (20 mg total) by mouth at bedtime. 11/24/18  Yes Poulose, Bethel Born, NP  calcium carbonate (CALCIUM 600) 600 MG TABS tablet Take 600 mg by mouth 2 (two) times daily with a meal.   Yes [provider]   Cholecalciferol (VITAMIN D3) 50 MCG (2000 UT) TABS Take 1 tablet by mouth daily.   Yes [provider]  Cyanocobalamin (VITAMIN B-12) 500 MCG SUBL Place under the tongue daily.  05/07/17  Yes Lada, Satira Anis, MD  metoprolol succinate (TOPROL-XL) 25 MG 24 hr tablet Take 1 tablet (25 mg total) by mouth daily. 11/24/18  Yes Poulose, Bethel Born, NP  triamterene-hydrochlorothiazide (MAXZIDE-25) 37.5-25 MG tablet Take 1 tablet by mouth daily. 11/24/18  Yes Poulose, Bethel Born, NP    Allergies: Allergies  Allergen Reactions  . Shellfish Allergy Swelling    angioedema  . Penicillin G Rash    Has patient had a PCN reaction causing immediate rash, facial/tongue/throat swelling, SOB or lightheadedness with hypotension: Yes Has patient had a PCN reaction causing severe rash involving mucus membranes or skin necrosis: Yes Has patient had a PCN reaction that required hospitalization: No Has patient had a PCN reaction occurring within the last 10 years: No If all of the above answers are "NO", then may proceed with Cephalosporin use.     Review of Systems: Review of Systems  Constitutional: Negative for chills and fever.  Respiratory: Negative for shortness of breath.   Cardiovascular: Negative for chest pain.  Gastrointestinal: Negative for abdominal pain, nausea and vomiting.  Skin: Negative for rash.    Physical Exam BP 139/82   Pulse 76   Temp 98.3 F (36.8 C)   Resp 15   Ht 5\' 1"  (1.549 m)   Wt 193 lb (87.5 kg)   SpO2 98%   BMI 36.47 kg/m  CONSTITUTIONAL: No acute distress HEENT:  Normocephalic, atraumatic, extraocular motion intact. RESPIRATORY:  Lungs are clear, and breath sounds are equal bilaterally. Normal respiratory effort without pathologic use of accessory muscles. CARDIOVASCULAR: Heart is regular without murmurs, gallops, or rubs. BREAST:  Right breast s/p lumpectomy of UOQ with scar well healed and appropriate post-op changes to the area after surgery and  radiation.  No palpable masses, skin changes, or nipple changes.  No right axillary or supraclavicular lymphadenopathy.  Left breast without any palpable masses, skin changes, or nipple changes.  No left axillary or supraclavicular lymphadenopathy. NEUROLOGIC:  Motor and sensation is grossly normal.  Cranial nerves are grossly intact. PSYCH:  Alert and oriented to person, place and time. Affect is normal.  Labs/Imaging: Mammogram 02/28/19: FINDINGS: Post operative changes are seen in the RIGHTbreast. Otherwise on the RIGHT breast, no abnormality identified.  Within the anterior retroareolar region of the LEFT breast there is a 5 millimeter group of punctate calcifications which vary in size and density.  Mammographic images were processed with CAD.  IMPRESSION: 1. Postoperative changes on the RIGHT. 2. Indeterminate calcifications in the retroareolar region of the LEFT breast for which biopsy is recommended.   Pathology 03/09/19: DIAGNOSIS:  A. LEFT BREAST, STEREOTACTIC NEEDLE CORE BIOPSY:  - BENIGN BREAST TISSUE WITH FAT NECROSIS AND FOCAL CYSTIC CHANGES.  - CALCIFICATIONS ARE NOTED IN ASSOCIATION WITH FAT NECROSIS AND WITH BENIGN BREAST TISSUE.  - NEGATIVE FOR ATYPIA AND MALIGNANCY.  Assessment and Plan: This is a 68 y.o. female s/p  right breast lumpectomy for DCIS in 01/2017.  --Reviewed mammogram and pathology findings with patient.  Physical exam today reassuring without any suspicious findings. --Patient will follow up in one year with bilateral diagnostic mammogram.   Face-to-face time spent with the patient and care providers was 15 minutes, with more than 50% of the time spent counseling, educating, and coordinating care of the patient.     Melvyn Neth, [facility]

## 2019-03-15 NOTE — Patient Instructions (Addendum)
We will contact you October 2021 to schedule your mammogram and breast exam for November 2021.  Breast Self-Awareness Breast self-awareness is knowing how your breasts look and feel. Doing breast self-awareness is important. It allows you to catch a breast problem early while it is still small and can be treated. All women should do breast self-awareness, including women who have had breast implants. Tell your doctor if you notice a change in your breasts. What you need:  A mirror.  A well-lit room. How to do a breast self-exam A breast self-exam is one way to learn what is normal for your breasts and to check for changes. To do a breast self-exam: Look for changes  1. Take off all the clothes above your waist. 2. Stand in front of a mirror in a room with good lighting. 3. Put your hands on your hips. 4. Push your hands down. 5. Look at your breasts and nipples in the mirror to see if one breast or nipple looks different from the other. Check to see if: ? The shape of one breast is different. ? The size of one breast is different. ? There are wrinkles, dips, and bumps in one breast and not the other. 6. Look at each breast for changes in the skin, such as: ? Redness. ? Scaly areas. 7. Look for changes in your nipples, such as: ? Liquid around the nipples. ? Bleeding. ? Dimpling. ? Redness. ? A change in where the nipples are. Feel for changes  1. Lie on your back on the floor. 2. Feel each breast. To do this, follow these steps: ? Pick a breast to feel. ? Put the arm closest to that breast above your head. ? Use your other arm to feel the nipple area of your breast. Feel the area with the pads of your three middle fingers by making small circles with your fingers. For the first circle, press lightly. For the second circle, press harder. For the third circle, press even harder. ? Keep making circles with your fingers at the different pressures as you move down your breast. Stop  when you feel your ribs. ? Move your fingers a little toward the center of your body. ? Start making circles with your fingers again, this time going up until you reach your collarbone. ? Keep making up-and-down circles until you reach your armpit. Remember to keep using the three pressures. ? Feel the other breast in the same way. 3. Sit or stand in the tub or shower. 4. With soapy water on your skin, feel each breast the same way you did in step 2 when you were lying on the floor. Write down what you find Writing down what you find can help you remember what to tell your doctor. Write down:  What is normal for each breast.  Any changes you find in each breast, including: ? The kind of changes you find. ? Whether you have pain. ? Size and location of any lumps.  When you last had your menstrual period. General tips  Check your breasts every month.  If you are breastfeeding, the best time to check your breasts is after you feed your baby or after you use a breast pump.  If you get menstrual periods, the best time to check your breasts is 5-7 days after your menstrual period is over.  With time, you will become comfortable with the self-exam, and you will begin to know if there are changes in your breasts. Contact  a doctor if you:  See a change in the shape or size of your breasts or nipples.  See a change in the skin of your breast or nipples, such as red or scaly skin.  Have fluid coming from your nipples that is not normal.  Find a lump or thick area that was not there before.  Have pain in your breasts.  Have any concerns about your breast health. Summary  Breast self-awareness includes looking for changes in your breasts, as well as feeling for changes within your breasts.  Breast self-awareness should be done in front of a mirror in a well-lit room.  You should check your breasts every month. If you get menstrual periods, the best time to check your breasts is 5-7  days after your menstrual period is over.  Let your doctor know of any changes you see in your breasts, including changes in size, changes on the skin, pain or tenderness, or fluid from your nipples that is not normal. This information is not intended to replace advice given to you by your health care provider. Make sure you discuss any questions you have with your health care provider. Document Released: 10/08/2007 Document Revised: 12/08/2017 Document Reviewed: 12/08/2017 Elsevier Patient Education  2020 [employer].  will contact you October 2021 to schedule your Mammogram and breast exam for November 2021.

## 2019-03-23 ENCOUNTER — Encounter: Payer: Self-pay | Admitting: Radiation Oncology

## 2019-03-23 ENCOUNTER — Other Ambulatory Visit: Payer: Self-pay

## 2019-03-23 ENCOUNTER — Ambulatory Visit
Admission: RE | Admit: 2019-03-23 | Discharge: 2019-03-23 | Disposition: A | Discharge status: Home / Self Care | Payer: BC Managed Care – PPO | Source: Ambulatory Visit | Attending: Radiation Oncology | Admitting: Radiation Oncology

## 2019-03-23 VITALS — BP 146/81 | HR 81 | Temp 97.1°F | Resp 18 | Wt 191.9 lb

## 2019-03-23 DIAGNOSIS — D0511 Intraductal carcinoma in situ of right breast: Secondary | ICD-10-CM | POA: Insufficient documentation

## 2019-03-23 DIAGNOSIS — Z170 Estrogen receptor positive status [ER+]: Secondary | ICD-10-CM | POA: Insufficient documentation

## 2019-03-23 DIAGNOSIS — Z79811 Long term (current) use of aromatase inhibitors: Secondary | ICD-10-CM | POA: Diagnosis not present

## 2019-03-23 DIAGNOSIS — Z923 Personal history of irradiation: Secondary | ICD-10-CM | POA: Insufficient documentation

## 2019-03-23 NOTE — Progress Notes (Signed)
Radiation Oncology Follow up Note  Name: Marissa Nelson   Date:   03/23/2019 MRN:  [id number] DOB: 1950-06-05    This 68 y.o. female presents to the clinic today for 2-year follow-up status post whole breast radiation to her right breast for ER/PR positive ductal carcinoma in situ.  REFERRING PROVIDER: Arnetha Courser, MD  HPI: Patient is a 68 year old female now at 2 years having completed whole breast radiation to her right breast for ER/PR positive ductal carcinoma in situ seen today in routine follow-up she is doing well..  She had a mammogram last month showing postoperative changes on the right although there are indeterminate calcifications in the retroareolar region of the left for which biopsy was recommended.  Biopsy was negative for atypia or malignancy there was some fat necrosis present.  She is currently on Arimidex tolerating that well without side effect.  COMPLICATIONS OF TREATMENT: none  FOLLOW UP COMPLIANCE: keeps appointments   PHYSICAL EXAM:  BP (!) 146/81 (BP Location: Left Arm, Patient Position: Sitting)   Pulse 81   Temp (!) 97.1 F (36.2 C) (Tympanic)   Resp 18   Wt 191 lb 14.4 oz (87 kg)   BMI 36.26 kg/m  Lungs are clear to A&P cardiac examination essentially unremarkable with regular rate and rhythm. No dominant mass or nodularity is noted in either breast in 2 positions examined. Incision is well-healed. No axillary or supraclavicular adenopathy is appreciated. Cosmetic result is excellent.  Well-developed well-nourished patient in NAD. HEENT reveals PERLA, EOMI, discs not visualized.  Oral cavity is clear. No oral mucosal lesions are identified. Neck is clear without evidence of cervical or supraclavicular adenopathy. Lungs are clear to A&P. Cardiac examination is essentially unremarkable with regular rate and rhythm without murmur rub or thrill. Abdomen is benign with no organomegaly or masses noted. Motor sensory and DTR levels are equal and symmetric in  the upper and lower extremities. Cranial nerves II through XII are grossly intact. Proprioception is intact. No peripheral adenopathy or edema is identified. No motor or sensory levels are noted. Crude visual fields are within normal range.  RADIOLOGY RESULTS: Mammograms reviewed compatible with above-stated findings  PLAN: Present time patient is now 2 years out with no evidence of disease.  I am pleased her left breast was negative for malignancy.  Of asked to see her back in 1 year for follow-up.  She continues on Arimidex without side effect.  Patient knows to call with any concerns.  I would like to take this opportunity to thank you for allowing me to participate in the care of your patient.Noreene Filbert, MD

## 2019-04-13 ENCOUNTER — Other Ambulatory Visit: Payer: Self-pay

## 2019-04-13 DIAGNOSIS — Z20822 Contact with and (suspected) exposure to covid-19: Secondary | ICD-10-CM

## 2019-04-15 LAB — NOVEL CORONAVIRUS, NAA: SARS-CoV-2, NAA: NOT DETECTED

## 2019-04-20 ENCOUNTER — Ambulatory Visit: Payer: BC Managed Care – PPO | Attending: Internal Medicine

## 2019-04-20 ENCOUNTER — Other Ambulatory Visit: Payer: Self-pay

## 2019-04-20 DIAGNOSIS — Z20822 Contact with and (suspected) exposure to covid-19: Secondary | ICD-10-CM

## 2019-04-22 ENCOUNTER — Inpatient Hospital Stay: Payer: BC Managed Care – PPO | Attending: Oncology | Admitting: Oncology

## 2019-04-22 DIAGNOSIS — Z170 Estrogen receptor positive status [ER+]: Secondary | ICD-10-CM | POA: Diagnosis not present

## 2019-04-22 DIAGNOSIS — I10 Essential (primary) hypertension: Secondary | ICD-10-CM | POA: Diagnosis not present

## 2019-04-22 DIAGNOSIS — D0511 Intraductal carcinoma in situ of right breast: Secondary | ICD-10-CM

## 2019-04-22 DIAGNOSIS — E785 Hyperlipidemia, unspecified: Secondary | ICD-10-CM | POA: Diagnosis not present

## 2019-04-22 DIAGNOSIS — Z833 Family history of diabetes mellitus: Secondary | ICD-10-CM | POA: Diagnosis not present

## 2019-04-22 DIAGNOSIS — Z803 Family history of malignant neoplasm of breast: Secondary | ICD-10-CM | POA: Diagnosis not present

## 2019-04-22 DIAGNOSIS — Z79811 Long term (current) use of aromatase inhibitors: Secondary | ICD-10-CM

## 2019-04-22 DIAGNOSIS — Z923 Personal history of irradiation: Secondary | ICD-10-CM | POA: Diagnosis not present

## 2019-04-22 DIAGNOSIS — Z800 Family history of malignant neoplasm of digestive organs: Secondary | ICD-10-CM | POA: Diagnosis not present

## 2019-04-22 DIAGNOSIS — Z5181 Encounter for therapeutic drug level monitoring: Secondary | ICD-10-CM

## 2019-04-22 LAB — NOVEL CORONAVIRUS, NAA: SARS-CoV-2, NAA: NOT DETECTED

## 2019-04-29 NOTE — Progress Notes (Signed)
I connected with Marissa Nelson on 04/29/19 at 11:15 AM EST by video enabled telemedicine visit and verified that I am speaking with the correct person using two identifiers.   I discussed the limitations, risks, security and privacy concerns of performing an evaluation and management service by telemedicine and the availability of in-person appointments. I also discussed with the patient that there may be a patient responsible charge related to this service. The patient expressed understanding and agreed to proceed.  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 Patient is a 68 year old African-American female with no significant comorbidities other than hypertension. She underwent screening bilateral mammogram in August 2017 which showed calcifications in her right breast. This was followed by a diagnostic mammogram which showed 2 sets of calcifications: 1 was 4 mm in the upper outer right breast and an additional 7 mm calcification in the central subareolar right breast. The 4 mm group of calcifications was biopsies and wasnegative for malignancy. Repeat mammogram was recommended in 6 months. This mammogram showed a new 15 mm calcifications in the upper outer quadrant of the right breast measuring 12 x 15 x 13 mm. Stable group of benign-appearing calcifications in the right breast upper outer quadrant measuring 5 mm.  2. Patient underwent 2 sets of biopsies. Biopsies from the right upper outer quadrant showed intermediate grade DCIS with comedonecrosis and associated calcifications. ER greater than 90% positive and PR was 11-50% positive. Biopsy of the right lower quadrant of the right breast showed fibroadenomatous changes with calcifications.  3. Patient has been seen by Dr.  Layla Maw is scheduled to undergo lumpectomy on 01/30/2017  4. Patient had menarche at the age of 25. 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 History of breast cancer in one of her sisters. She is G2 P2 L2. She did not breast-feed.  5.Patient underwent lumpectomy on 01/30/2017 which showed a 15 mm DCIS, grade 2 with comedonecrosis. Margins were -3 mm from the closest medial margin lymph nodes were not submitted. Pathology stagepTisNx.ER greater than 90% positive PR 11-50% positive  6. Patient completed adjuvant RT and started arimidex in jan 2019  Interval history: she is tolerating arimidex well without any significant side effects. She is also taking calcium and vit D   Review of Systems  Constitutional: Negative for chills, fever, malaise/fatigue and weight loss.  HENT: Negative for congestion, ear discharge and nosebleeds.   Eyes: Negative for blurred vision.  Respiratory: Negative for cough, hemoptysis, sputum production, shortness of breath and wheezing.   Cardiovascular: Negative for chest pain, palpitations, orthopnea and claudication.  Gastrointestinal: Negative for abdominal pain, blood in stool, constipation, diarrhea, heartburn, melena, nausea and vomiting.  Genitourinary: Negative for dysuria, flank pain, frequency, hematuria and urgency.  Musculoskeletal: Negative for back pain, joint pain and myalgias.  Skin: Negative for rash.  Neurological: Negative for dizziness, tingling, focal weakness, seizures, weakness and headaches.  Endo/Heme/Allergies: Does not bruise/bleed easily.  Psychiatric/Behavioral: Negative for depression and suicidal ideas. The patient does not have insomnia.     Allergies  Allergen Reactions  . Shellfish Allergy Swelling    angioedema  . Penicillin G Rash    Has patient had a PCN reaction  causing immediate rash, facial/tongue/throat  swelling, SOB or lightheadedness with hypotension: Yes Has patient had a PCN reaction causing severe rash involving mucus membranes or skin necrosis: Yes Has patient had a PCN reaction that required hospitalization: No Has patient had a PCN reaction occurring within the last 10 years: No If all of the above answers are "NO", then may proceed with Cephalosporin use.     Past Medical History:  Diagnosis Date  . Abnormal mammogram of right breast 01/17/2016   Recommendation: Six month follow-up diagnostic mammogram of the right breast for an additional group of probably benign Calcifications. Sept 2017 --> due March 2018  . Arthralgia 10/04/2016  . Arthritis    hands  . Benign essential HTN 12/07/2006  . Breast cancer ([place]) 2018   right breast DCIS with rad tx  . Breast cancer screening 11/15/2015  . Chronic kidney disease, stage II (mild) 11/15/2015  . Chronic left shoulder pain 11/08/2014  . 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 Family history of diabetes mellitus 06/12/2016   Mother and several other relatives  . HLD (hyperlipidemia) 06/15/2015  . Hx of right breast biopsy 01/17/2016  . Hyperlipidemia   . Hypertension   . Medication monitoring encounter 11/15/2015  . Motion sickness    ocean ships  . Numbness and tingling in left hand 11/07/2014  . Obesity 10/25/2015  . Obesity (BMI 30.0-34.9) 10/25/2015  . Personal history of radiation therapy 2018   right breast ca  . Positive ANA (antinuclear antibody) 10/04/2016  . Prediabetes   . Preventative health care 11/15/2015  . Vitamin D deficiency 10/29/2015    Past Surgical History:  Procedure Laterality Date  . ABDOMINAL HYSTERECTOMY    . BREAST BIOPSY Right 01/15/2016    cylinder shape calcs UOQ, FIBROADENOMATOUS  . BREAST BIOPSY Right 12/30/2016   stereo bx for calcifications: DCIS UOQ, ribbon shaped  . BREAST BIOPSY Right 01/08/2017   LOQ x shaped, FIBROADENOMATOUS CHANGE WITH  . BREAST BIOPSY Left  03/09/2019   calcs bx, x marker path pending  . BREAST LUMPECTOMY Right 01/30/2018   DCIS clear margins  . BREAST LUMPECTOMY WITH NEEDLE LOCALIZATION Right 01/30/2017   Procedure: BREAST LUMPECTOMY WITH NEEDLE LOCALIZATION;  Surgeon: Vickie Epley, MD;  Location: [facility];  Service: General;  Laterality: Right;  . COLONOSCOPY WITH PROPOFOL N/A 11/19/2016   Procedure: COLONOSCOPY WITH PROPOFOL;  Surgeon: Jonathon Bellows, MD;  Location: [place];  Service: Endoscopy;  Laterality: N/A;  . ESOPHAGOGASTRODUODENOSCOPY (EGD) WITH PROPOFOL N/A 11/19/2016   Procedure: ESOPHAGOGASTRODUODENOSCOPY (EGD) WITH PROPOFOL;  Surgeon: Jonathon Bellows, MD;  Location: [place];  Service: Endoscopy;  Laterality: N/A;  . GIVENS CAPSULE STUDY N/A 12/12/2016   Procedure: GIVENS CAPSULE STUDY;  Surgeon: Jonathon Bellows, MD;  Location: [facility];  Service: Gastroenterology;  Laterality: N/A;  . TOTAL MASTECTOMY Right 01/30/2017   Error    Social History   Socioeconomic History  . Marital status: Married    Spouse name: Not on file  . Number of children: 2  . Years of education: Not on file  . Highest education level: Not on file  Occupational History  . Occupation: Works in [employer] [place], [place])    Employer: [employer]  Tobacco Use  . Smoking status: Never Smoker  . Smokeless tobacco: Never Used  Substance and Sexual Activity  . Alcohol use: No    Alcohol/week: 0.0 standard drinks  . Drug use: No  .  Sexual activity: Not Currently  Other Topics Concern  . Not on file  Social History Narrative  . Not on file   Social Determinants of Health   Financial Resource Strain:   . Difficulty of Paying Living Expenses: Not on file  Food Insecurity:   . Worried About Charity fundraiser in the Last Year: Not on file  . Ran Out of Food in the Last Year: Not on file  Transportation Needs:   . Lack of Transportation (Medical): Not on file  . Lack of Transportation  (Non-Medical): Not on file  Physical Activity:   . Days of Exercise per Week: Not on file  . Minutes of Exercise per Session: Not on file  Stress:   . Feeling of Stress : Not on file  Social Connections:   . Frequency of Communication with Friends and Family: Not on file  . Frequency of Social Gatherings with Friends and Family: Not on file  . Attends Religious Services: Not on file  . Active Member of Clubs or Organizations: Not on file  . Attends Archivist Meetings: Not on file  . Marital Status: Not on file  Intimate Partner Violence:   . Fear of Current or Ex-Partner: Not on file  . Emotionally Abused: Not on file  . Physically Abused: Not on file  . Sexually Abused: Not on file    Family History  Problem Relation Age of Onset  . Cancer Mother 10       liver cancer  . Diabetes Mother   . Hypertension Mother   . Glomerulonephritis Father   . Diabetes Sister   . Glomerulonephritis Sister   . Cancer Sister 53       throat cancer  . Heart disease Sister   . Diabetes Sister   . Diabetes Sister   . COPD Sister   . Diabetes Sister   . Breast cancer Cousin        Currently undergoing systemic chemotherapy (01/2017)  . Varicose Veins Neg Hx      Current Outpatient Medications:  .  acetaminophen (TYLENOL) 325 MG tablet, Take 650 mg by mouth every 6 (six) hours as needed for mild pain or headache. , Disp: , Rfl:  .  anastrozole (ARIMIDEX) 1 MG tablet, TAKE ONE TABLET BY MOUTH EVERY DAY, Disp: 90 tablet, Rfl: 1 .  atorvastatin (LIPITOR) 20 MG tablet, Take 1 tablet (20 mg total) by mouth at bedtime., Disp: 90 tablet, Rfl: 1 .  calcium carbonate (CALCIUM 600) 600 MG TABS tablet, Take 600 mg by mouth 2 (two) times daily with a meal., Disp: , Rfl:  .  Cholecalciferol (VITAMIN D3) 50 MCG (2000 UT) TABS, Take 1 tablet by mouth daily., Disp: , Rfl:  .  Cyanocobalamin (VITAMIN B-12) 500 MCG SUBL, Place under the tongue daily. , Disp: , Rfl:  .  metoprolol succinate  (TOPROL-XL) 25 MG 24 hr tablet, Take 1 tablet (25 mg total) by mouth daily., Disp: 90 tablet, Rfl: 1 .  triamterene-hydrochlorothiazide (MAXZIDE-25) 37.5-25 MG tablet, Take 1 tablet by mouth daily., Disp: 90 tablet, Rfl: 1  No results found.  No images are attached to the encounter.   CMP Latest Ref Rng & Units 05/12/2018  Glucose 65 - 99 mg/dL 86  BUN 7 - 25 mg/dL 28(H)  Creatinine 0.50 - 0.99 mg/dL 1.37(H)  Sodium 135 - 146 mmol/L 140  Potassium 3.5 - 5.3 mmol/L 4.4  Chloride 98 - 110 mmol/L 102  CO2 20 - 32  mmol/L 27  Calcium 8.6 - 10.4 mg/dL 9.8  Total Protein 6.1 - 8.1 g/dL 7.1  Total Bilirubin 0.2 - 1.2 mg/dL 0.3  Alkaline Phos 38 - 126 U/L -  AST 10 - 35 U/L 19  ALT 6 - 29 U/L 20   CBC Latest Ref Rng & Units 04/14/2017  WBC 3.6 - 11.0 K/uL 5.0  Hemoglobin 12.0 - 16.0 g/dL 12.0  Hematocrit 35.0 - 47.0 % 35.5  Platelets 150 - 440 K/uL 217     Observation/objective:appears in no acute distress over video visit today. Breathing is non labored  Assessment and plan:Patient is a 68 year old female with a history of right breast DCIS status post lumpectomy and adjuvant radiation therapy currently on Arimidex since January 2019. This is a routine f/u visit for breast cancer  Clinically doing well and no concerning symptoms on todays visit. Her appetite is good. Denies any unintentional weight loss. Tolerating arimidex well. Mammogram in October 2020 did not show any malignancy  Follow-up instructions:I will see her back in 6 months with cbc with diff and cmp. Needs a bone density prior  I discussed the assessment and treatment plan with the patient. The patient was provided an opportunity to ask questions and all were answered. The patient agreed with the plan and demonstrated an understanding of the instructions.   The patient was advised to call back or seek an in-person evaluation if the symptoms worsen or if the condition fails to improve as anticipated.   Visit  Diagnosis: 1. Ductal carcinoma in situ (DCIS) of right breast   2. Visit for monitoring Arimidex therapy     Dr. Randa Evens, MD, MPH [facility] Pager- [phone] 04/29/2019 10:43 AM

## 2019-04-30 DIAGNOSIS — R05 Cough: Secondary | ICD-10-CM | POA: Diagnosis not present

## 2019-04-30 DIAGNOSIS — J22 Unspecified acute lower respiratory infection: Secondary | ICD-10-CM | POA: Diagnosis not present

## 2019-05-06 IMAGING — MG MM BREAST BX W/ LOC DEV 1ST LESION IMAGE BX SPEC STEREO GUIDE*R*
8 of 14 series · 8 of 22 positions shown · non-contrast
Comparison: Previous exams.

ADDENDUM:
PATHOLOGY:

A. BREAST, RIGHT, UPPER OUTER QUADRANT; STEREOTACTIC-GUIDED CORE
BIOPSY:
- DUCTAL CARCINOMA IN SITU (DCIS), INTERMEDIATE NUCLEAR GRADE WITH
COMEDONECROSIS, ASSOCIATED WITH CALCIFICATIONS.
Comment:
In this sample DCIS is in multiple core fragments and spans up to 5
mm.
No invasive carcinoma is identified.
CONCORDANT: YES
I telephoned the patient on 12/31/2016 at approximately [DATE] p.m.
and discussed these results and the recommendations stated below.
All questions were answered. The patient was asked to call the
[HOSPITAL] with any questions or issues related to the
biopsy.
RECOMMENDATION:
1. Surgical consultation is recommended. The patient will be
contacted by one of the nurse navigators with the referral.
2. Stereotactic guided biopsy of the additional 5 mm group of
calcifications within the central, subareolar right breast, middle
depth. The patient will be contacted by the [REDACTED] to
schedule this biopsy.
CLINICAL DATA: Patient with indeterminate right breast
calcifications.
EXAM:
RIGHT BREAST STEREOTACTIC CORE NEEDLE BIOPSY

[R (1 of 8)]
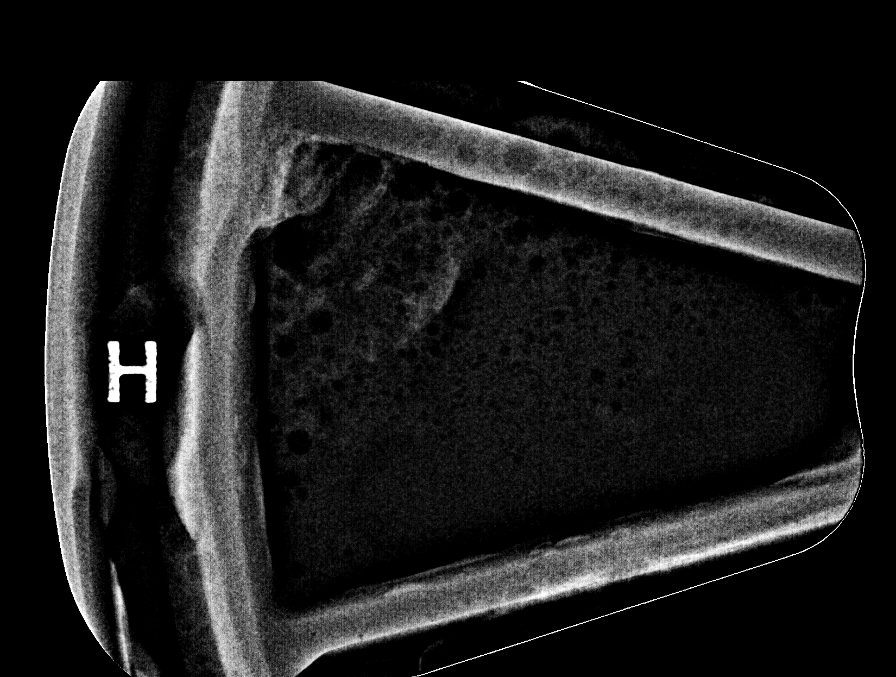

[R (2 of 8)]
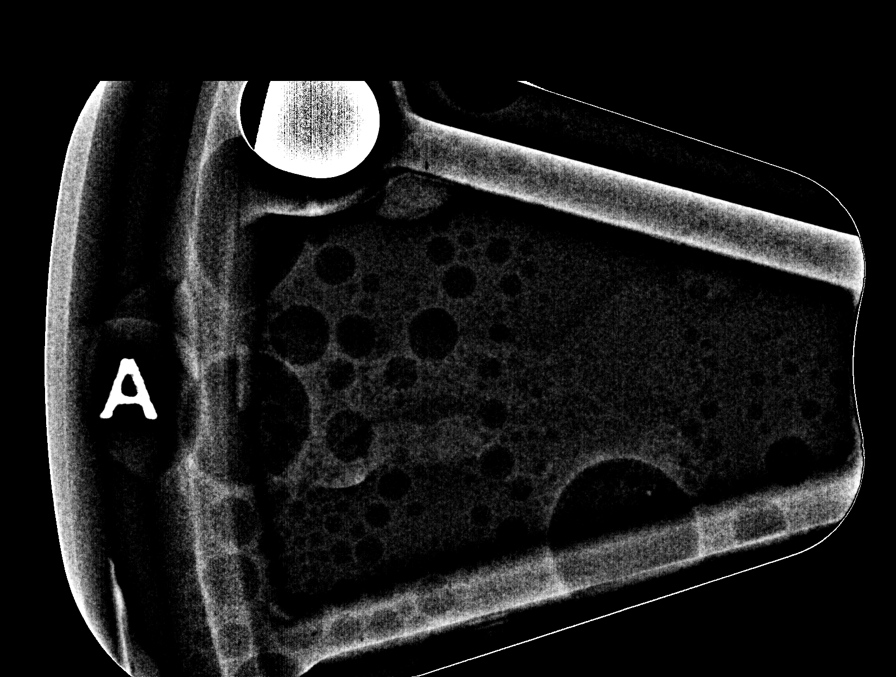

[R (3 of 8)]
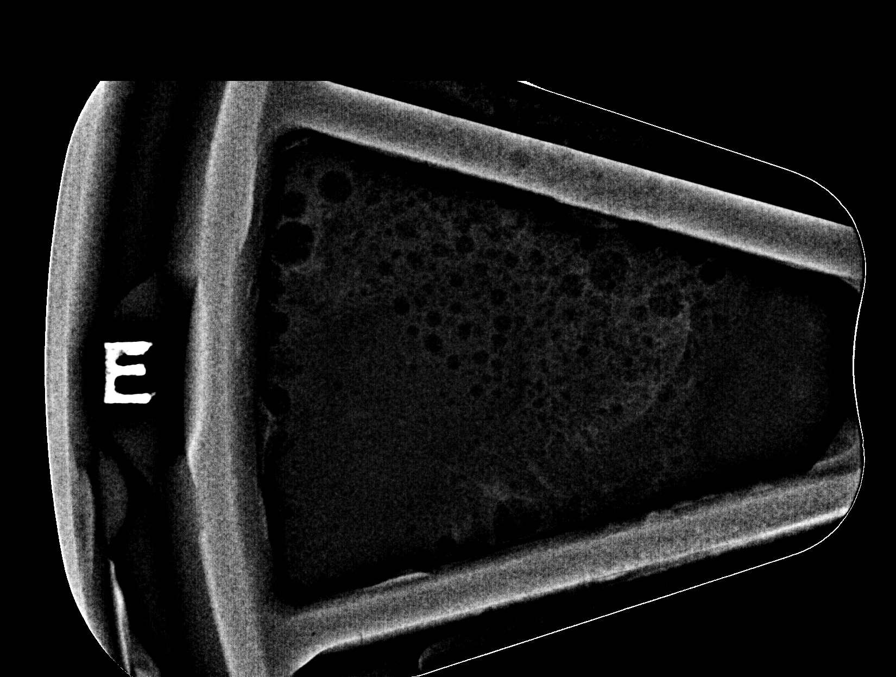

[R (4 of 8)]
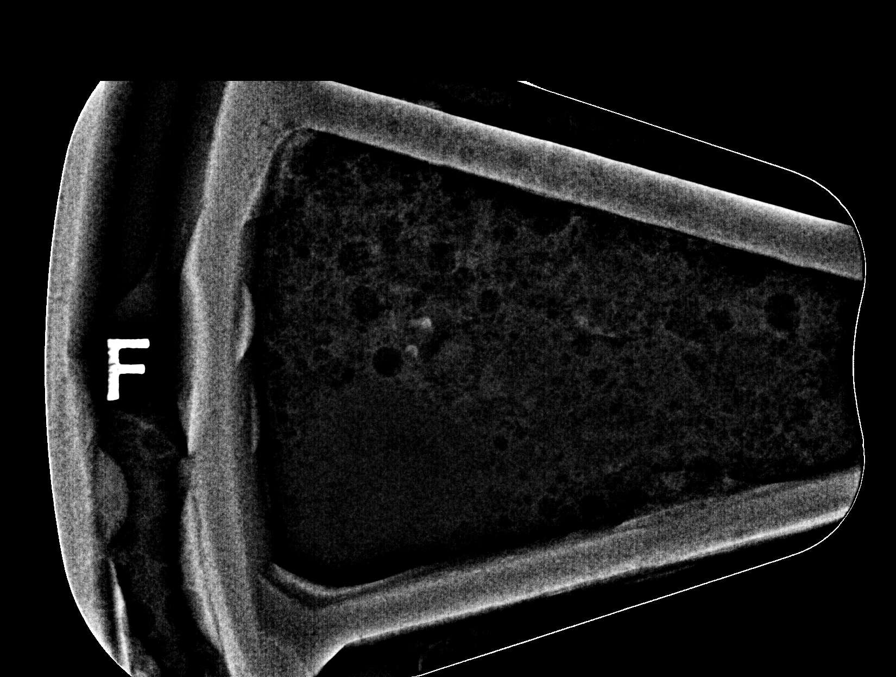

[R (5 of 8)]
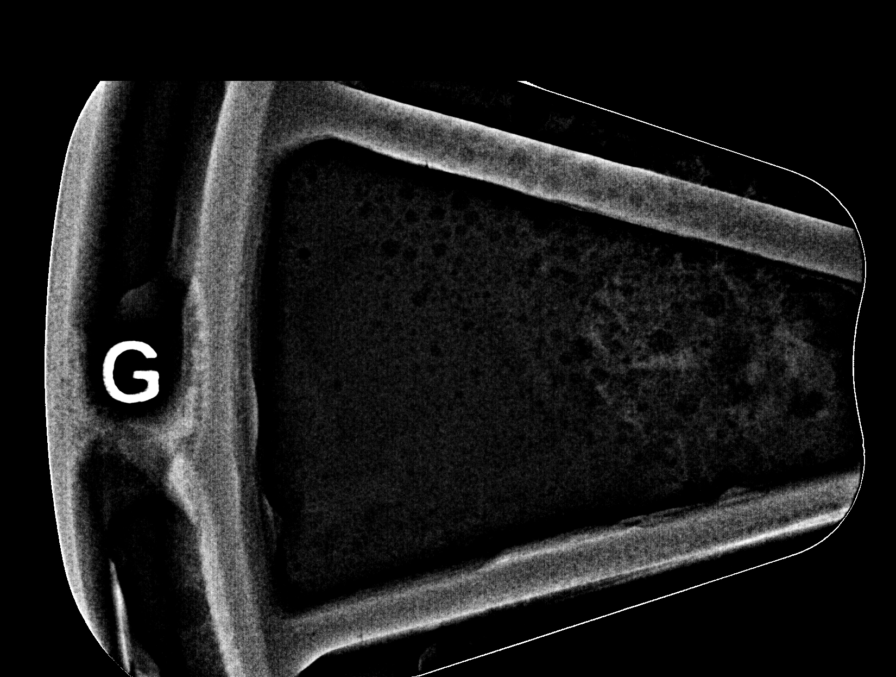

[R (6 of 8)]
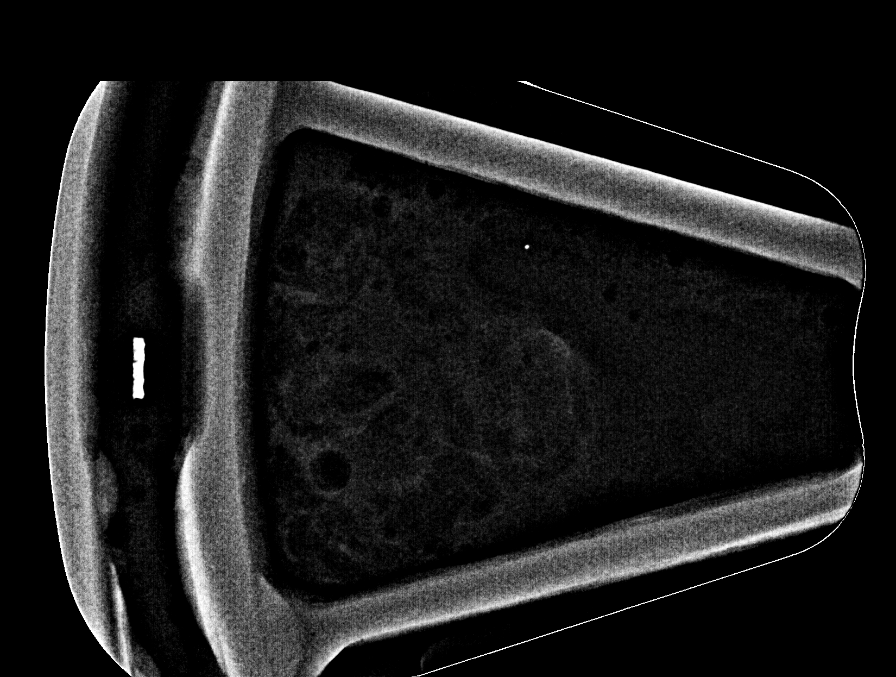

[R (7 of 8)]
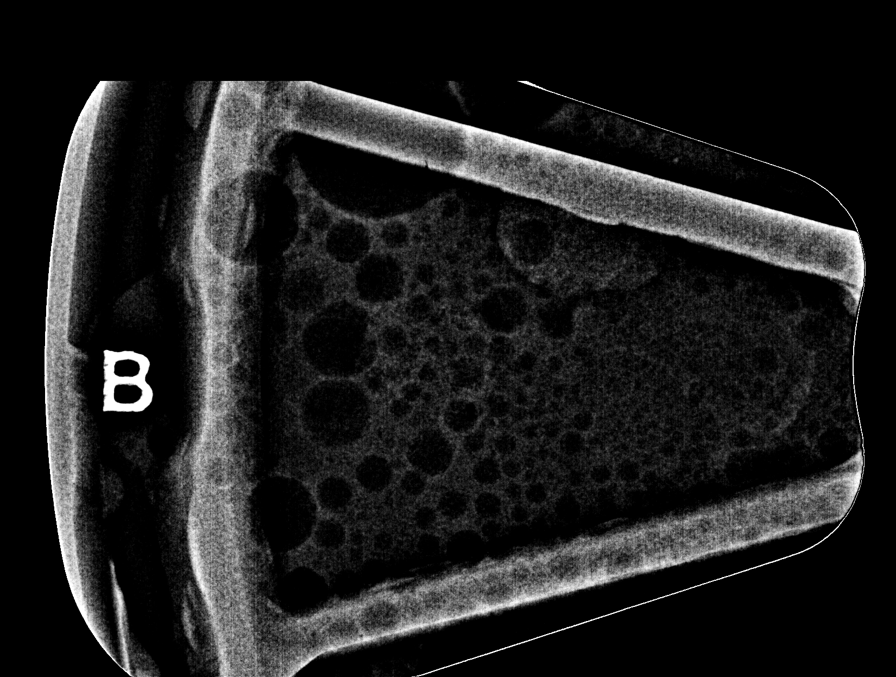

[R (8 of 8)]
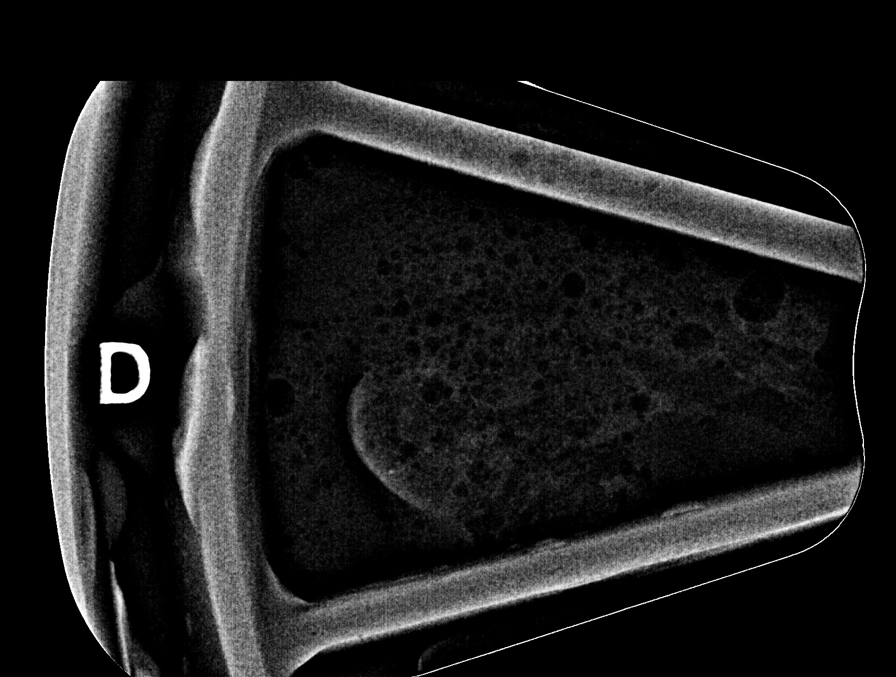

[8 of 22 positions shown; findings below may reference images not displayed]



Using sterile technique and 1% Lidocaine as local anesthetic, under
stereotactic guidance, a 9 gauge vacuum assisted device was used to
perform core needle biopsy of calcifications within the upper-outer
right breast anterior depth using a cranial approach. Specimen
radiograph was performed showing calcifications. Specimens with
calcifications are identified for pathology.

Lesion quadrant: Upper outer quadrant

At the conclusion of the procedure, a ribbon shaped tissue marker
clip was deployed into the biopsy cavity. Follow-up 2-view mammogram
was performed and dictated separately.
IMPRESSION: Stereotactic-guided biopsy of right breast calcifications. No
apparent complications.

## 2019-05-06 IMAGING — MG MM BREAST LOCALIZATION CLIP
2 series · 2 of 2 positions shown · non-contrast
Comparison: Previous exam(s).

CLINICAL DATA: Patient status post stereotactic guided biopsy right
breast calcifications.

EXAM:
DIAGNOSTIC RIGHT MAMMOGRAM POST STEREOTACTIC BIOPSY

[R ML]
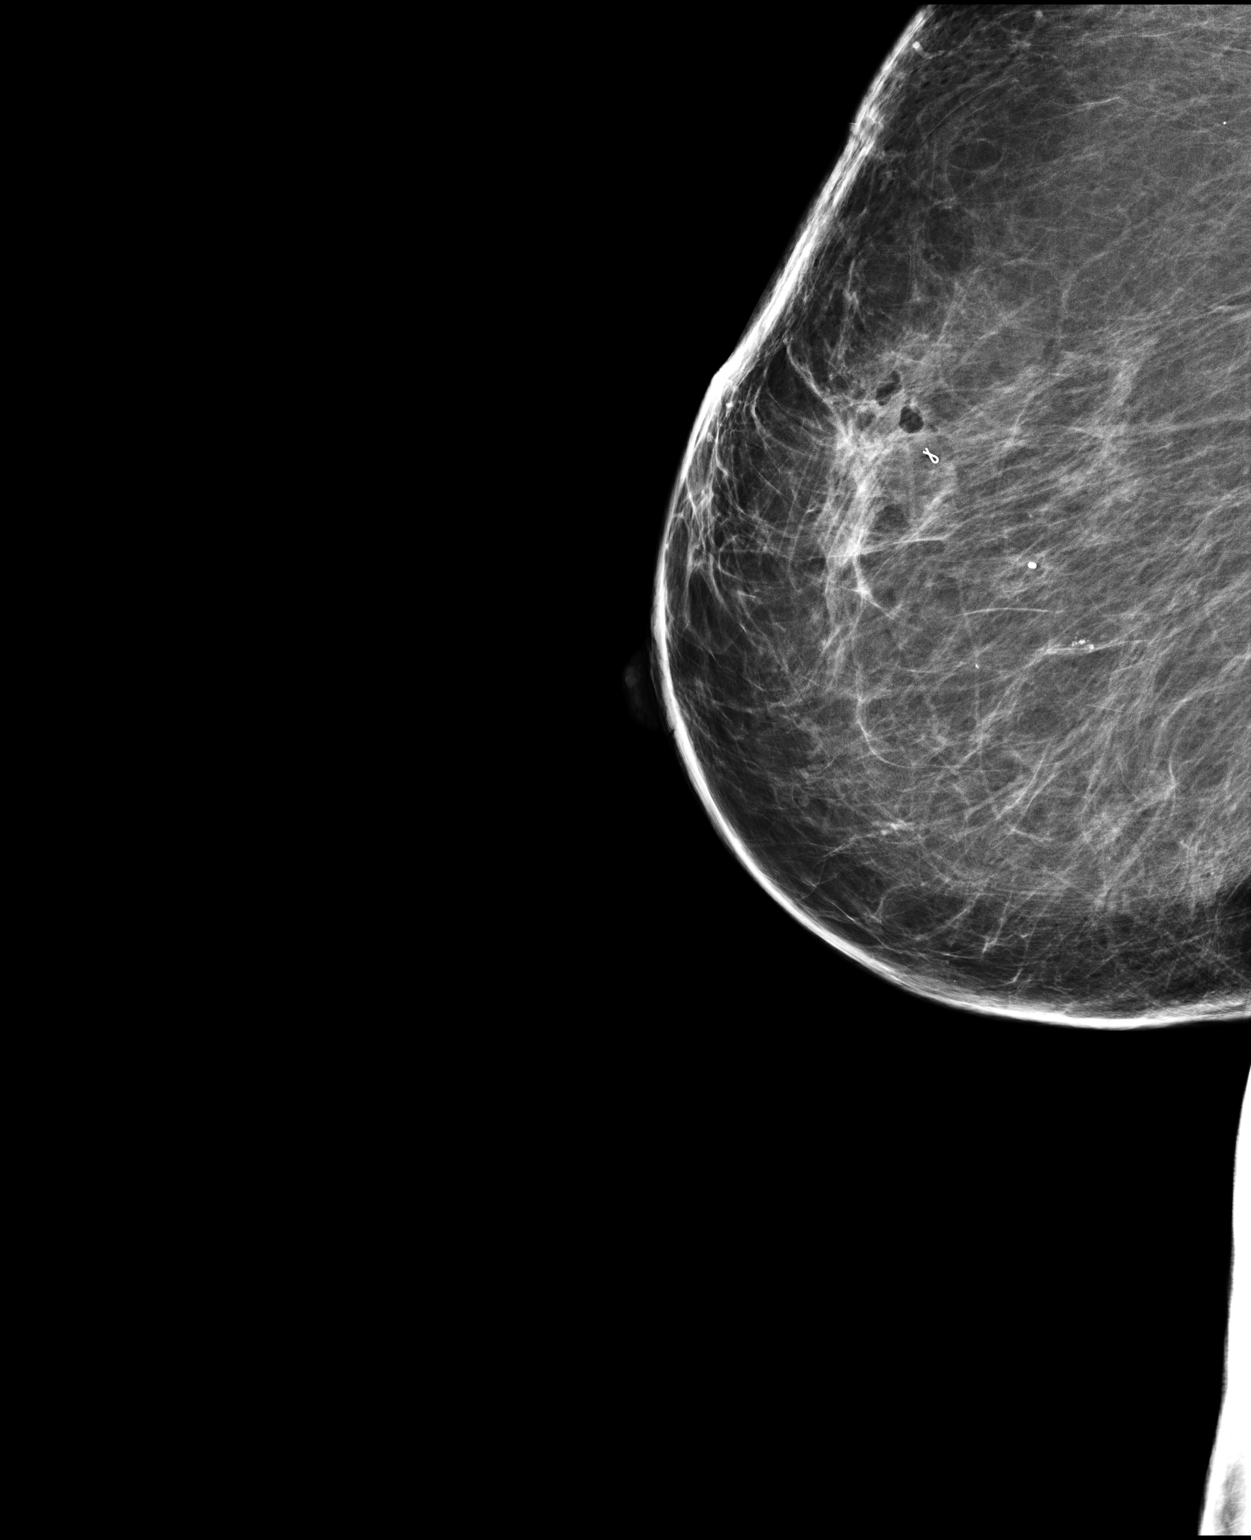

[R CC]
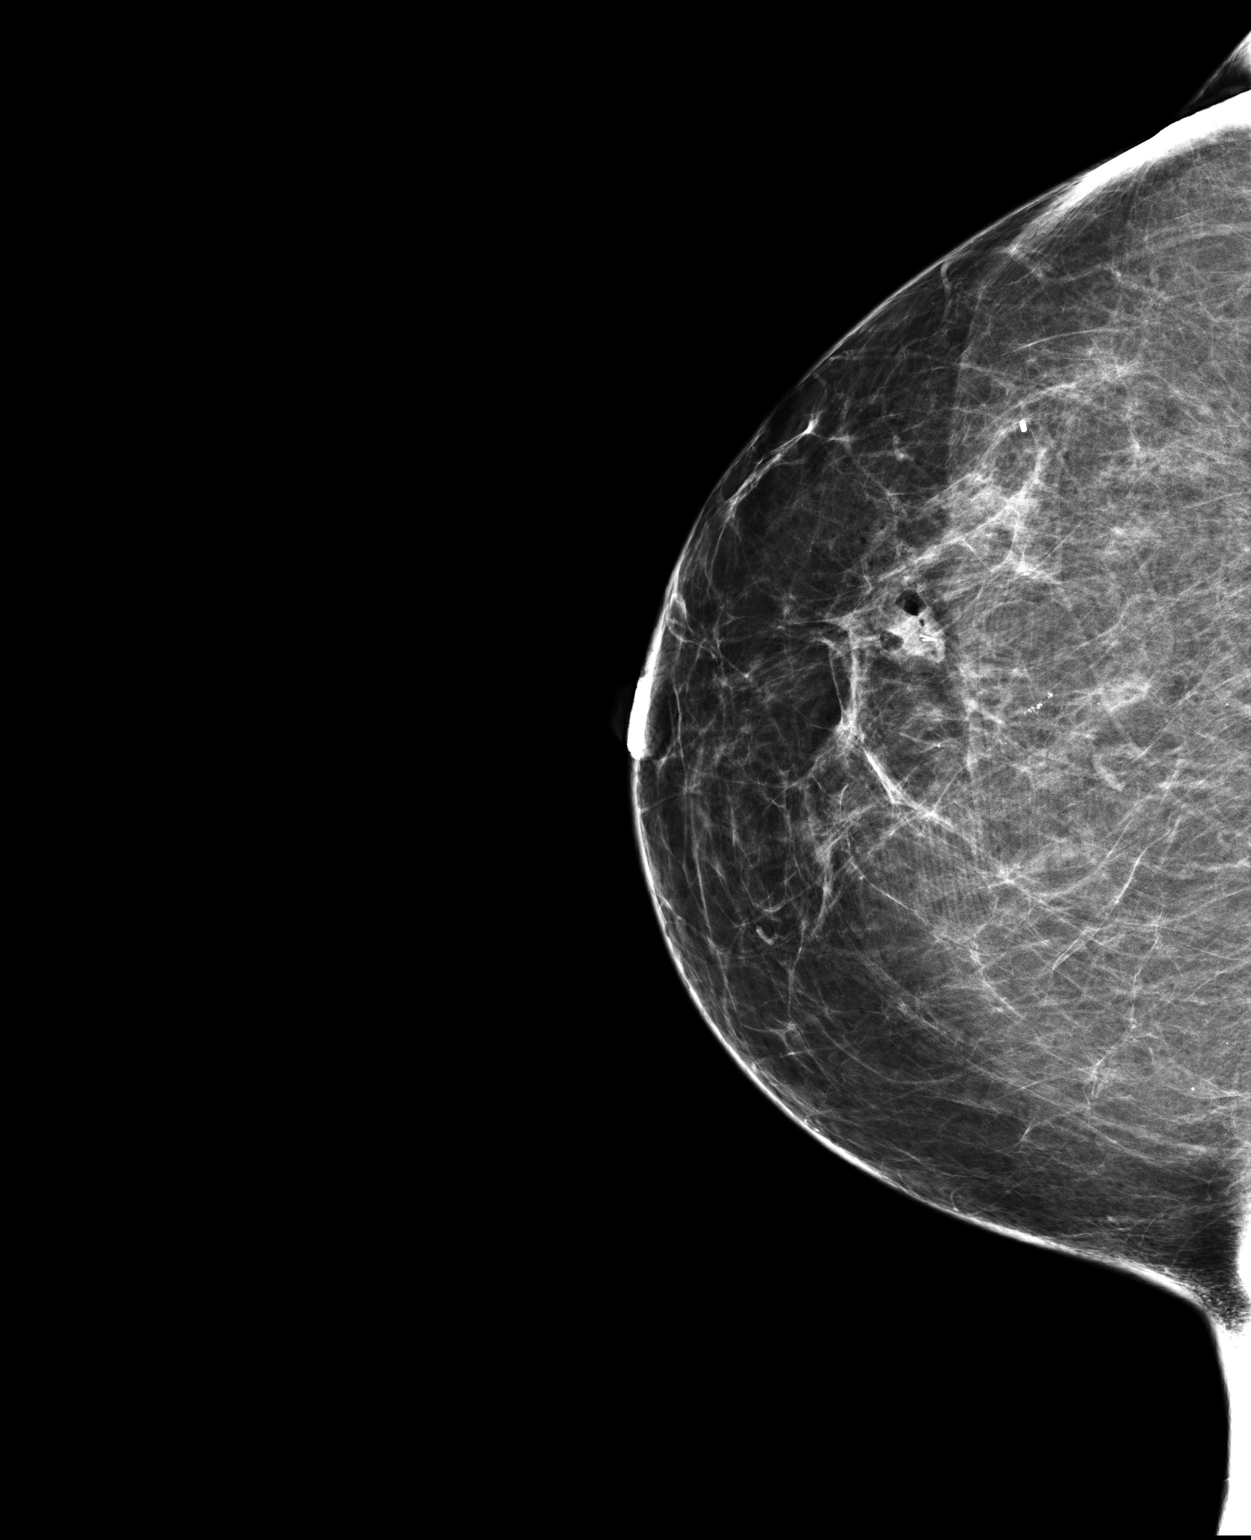

[2 of 2 positions shown; findings below may reference images not displayed]

FINDINGS: Mammographic images were obtained following stereotactic guided
biopsy of right breast calcifications. Ribbon shaped marking clip in
appropriate position within the upper-outer right breast anterior
depth.
IMPRESSION: Appropriate position ribbon shaped marking clip status post
stereotactic guided biopsy right breast calcifications.

Final Assessment: Post Procedure Mammograms for Marker Placement

## 2019-05-07 ENCOUNTER — Other Ambulatory Visit: Payer: Self-pay

## 2019-05-07 ENCOUNTER — Emergency Department
Admission: EM | Admit: 2019-05-07 | Discharge: 2019-05-08 | Disposition: A | Discharge status: Home / Self Care | Payer: BC Managed Care – PPO | Attending: Emergency Medicine | Admitting: Emergency Medicine

## 2019-05-07 ENCOUNTER — Encounter: Payer: Self-pay | Admitting: Emergency Medicine

## 2019-05-07 ENCOUNTER — Emergency Department: Payer: BC Managed Care – PPO

## 2019-05-07 ENCOUNTER — Other Ambulatory Visit
Admission: RE | Admit: 2019-05-07 | Discharge: 2019-05-07 | Disposition: A | Discharge status: Home / Self Care | Payer: BC Managed Care – PPO | Source: Ambulatory Visit | Attending: Family Medicine | Admitting: Family Medicine

## 2019-05-07 DIAGNOSIS — Z79899 Other long term (current) drug therapy: Secondary | ICD-10-CM | POA: Diagnosis not present

## 2019-05-07 DIAGNOSIS — R350 Frequency of micturition: Secondary | ICD-10-CM | POA: Diagnosis not present

## 2019-05-07 DIAGNOSIS — J189 Pneumonia, unspecified organism: Secondary | ICD-10-CM | POA: Insufficient documentation

## 2019-05-07 DIAGNOSIS — N183 Chronic kidney disease, stage 3 unspecified: Secondary | ICD-10-CM | POA: Diagnosis not present

## 2019-05-07 DIAGNOSIS — R05 Cough: Secondary | ICD-10-CM | POA: Diagnosis not present

## 2019-05-07 DIAGNOSIS — Z20822 Contact with and (suspected) exposure to covid-19: Secondary | ICD-10-CM | POA: Diagnosis not present

## 2019-05-07 DIAGNOSIS — J22 Unspecified acute lower respiratory infection: Secondary | ICD-10-CM | POA: Diagnosis not present

## 2019-05-07 DIAGNOSIS — I129 Hypertensive chronic kidney disease with stage 1 through stage 4 chronic kidney disease, or unspecified chronic kidney disease: Secondary | ICD-10-CM | POA: Diagnosis not present

## 2019-05-07 DIAGNOSIS — R0602 Shortness of breath: Secondary | ICD-10-CM | POA: Insufficient documentation

## 2019-05-07 LAB — D-DIMER, QUANTITATIVE (NOT AT ARMC): D-Dimer, Quant: 1.51 ug/mL-FEU — ABNORMAL HIGH (ref 0.00–0.50)

## 2019-05-07 MED ORDER — SODIUM CHLORIDE 0.9 % IV BOLUS
1000.0000 mL | Freq: Once | INTRAVENOUS | Status: AC
  Administered 2019-05-07: 18:00:00 1000 mL via INTRAVENOUS

## 2019-05-07 MED ORDER — ALBUTEROL SULFATE HFA 108 (90 BASE) MCG/ACT IN AERS: INHALATION_SPRAY | RESPIRATORY_TRACT | 1 refills | Status: DC

## 2019-05-07 MED ORDER — IOHEXOL 350 MG/ML SOLN
60.0000 mL | Freq: Once | INTRAVENOUS | Status: AC | PRN
  Administered 2019-05-07: 75 mL via INTRAVENOUS

## 2019-05-07 NOTE — ED Provider Notes (Signed)
[facility] Emergency Department Provider Note  ____________________________________________   First MD Initiated Contact with Patient 05/07/19 2313     (approximate)  I have reviewed the triage vital signs and the nursing notes.   HISTORY  Chief Complaint Shortness of Breath    HPI Marissa Nelson is a 69 y.o. female with medical history as listed below who presents for further evaluation of abnormal labs.  She was seen in the clinic earlier in the day for a 1 week follow-up after being diagnosed with pneumonia.  She has continued to have shortness of breath and cough, generalized weakness, and malaise, decreased appetite, etc.  No fever/chills, body aches, chest pain, no abdominal pain.  No nausea or vomiting.  She denies sore throat and loss of smell and taste although she says that she lost her sense of smell about a year ago and it is coming back slowly.    She was treated with azithromycin , prednisone, and Tessalon Perles but has had only minimal improvement.  Her shortness of breath particular with exertion has been relatively constant and has not gotten worse.  She tested negative for COVID-19 just prior to the onset of her symptoms for which she was seen originally.  She says that she is quarantining herself and has no known COVID-19 contacts.  She had a follow-up visit with her doctor today and a identified some persistent left lower lobe pneumonia on chest x-ray.  She was prescribed Levaquin and prednisone and lab work was performed.  Lab work was generally reassuring but she was sent to the ED for CTA chest for an elevated D-dimer.  She has been calm and comfortable in the ED for about 6 hours awaiting the results of the scan.  Nothing in particular makes her symptoms better and they have been relatively stable for a week.        Past Medical History:  Diagnosis Date  . Abnormal mammogram of right breast 01/17/2016   Recommendation: Six month  follow-up diagnostic mammogram of the right breast for an additional group of probably benign Calcifications. Sept 2017 --> due March 2018  . Arthralgia 10/04/2016  . Arthritis    hands  . Benign essential HTN 12/07/2006  . 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 Breast cancer screening 11/15/2015  . Chronic kidney disease, stage II (mild) 11/15/2015  . Chronic left shoulder pain 11/08/2014  . 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 Family history of diabetes mellitus 06/12/2016   Mother and several other relatives  . HLD (hyperlipidemia) 06/15/2015  . Hx of right breast biopsy 01/17/2016  . Hyperlipidemia   . Hypertension   . Medication monitoring encounter 11/15/2015  . Motion sickness    ocean ships  . Numbness and tingling in left hand 11/07/2014  . Obesity 10/25/2015  . Obesity (BMI 30.0-34.9) 10/25/2015  . Personal history of radiation therapy 2018   right breast ca  . Positive ANA (antinuclear antibody) 10/04/2016  . Prediabetes   . Preventative health care 11/15/2015  . Vitamin D deficiency 10/29/2015    Patient Active Problem List   Diagnosis Date Noted  . Obesity (BMI 30.0-34.9) 02/24/2018  . Vitamin B12 deficiency 05/07/2017  . 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 Arthralgia 10/04/2016  . Prediabetes 06/20/2016  . Chronic kidney disease, stage III (moderate) ([place]) 11/15/2015  . Vitamin D deficiency 10/29/2015  .  HLD (hyperlipidemia) 06/15/2015  . Obstructive apnea 06/15/2015  . Chronic left shoulder pain 11/08/2014  . Numbness and tingling in left hand 11/07/2014  . Benign essential HTN 12/07/2006    Past Surgical History:  Procedure Laterality Date  . ABDOMINAL HYSTERECTOMY    . BREAST BIOPSY Right 01/15/2016    cylinder shape calcs UOQ, FIBROADENOMATOUS  . BREAST BIOPSY Right 12/30/2016   stereo bx for calcifications: DCIS UOQ, ribbon shaped  . BREAST BIOPSY Right 01/08/2017   LOQ x shaped,  FIBROADENOMATOUS CHANGE WITH  . BREAST BIOPSY Left 03/09/2019   calcs bx, x marker path pending  . BREAST LUMPECTOMY Right 01/30/2018   DCIS clear margins  . BREAST LUMPECTOMY WITH NEEDLE LOCALIZATION Right 01/30/2017   Procedure: BREAST LUMPECTOMY WITH NEEDLE LOCALIZATION;  Surgeon: Vickie Epley, MD;  Location: [facility];  Service: General;  Laterality: Right;  . COLONOSCOPY WITH PROPOFOL N/A 11/19/2016   Procedure: COLONOSCOPY WITH PROPOFOL;  Surgeon: Jonathon Bellows, MD;  Location: [place];  Service: Endoscopy;  Laterality: N/A;  . ESOPHAGOGASTRODUODENOSCOPY (EGD) WITH PROPOFOL N/A 11/19/2016   Procedure: ESOPHAGOGASTRODUODENOSCOPY (EGD) WITH PROPOFOL;  Surgeon: Jonathon Bellows, MD;  Location: [place];  Service: Endoscopy;  Laterality: N/A;  . GIVENS CAPSULE STUDY N/A 12/12/2016   Procedure: GIVENS CAPSULE STUDY;  Surgeon: Jonathon Bellows, MD;  Location: [facility] ENDOSCOPY;  Service: Gastroenterology;  Laterality: N/A;  . TOTAL MASTECTOMY Right 01/30/2017   Error    Prior to Admission medications   Medication Sig Start Date End Date Taking? Authorizing Provider  acetaminophen (TYLENOL) 325 MG tablet Take 650 mg by mouth every 6 (six) hours as needed for mild pain or headache.     [provider]  albuterol (VENTOLIN HFA) 108 (90 Base) MCG/ACT inhaler Inhale 2-4 puffs by mouth every 4 hours as needed for wheezing, cough, and/or shortness of breath 05/07/19   Hinda Kehr, MD  anastrozole (ARIMIDEX) 1 MG tablet TAKE ONE TABLET BY MOUTH EVERY DAY 12/20/18   Sindy Guadeloupe, MD  atorvastatin (LIPITOR) 20 MG tablet Take 1 tablet (20 mg total) by mouth at bedtime. 11/24/18   Poulose, Bethel Born, NP  calcium carbonate (CALCIUM 600) 600 MG TABS tablet Take 600 mg by mouth 2 (two) times daily with a meal.    [provider]  Cholecalciferol (VITAMIN D3) 50 MCG (2000 UT) TABS Take 1 tablet by mouth daily.    [provider]  Cyanocobalamin (VITAMIN B-12) 500 MCG SUBL  Place under the tongue daily.  05/07/17   Arnetha Courser, MD  metoprolol succinate (TOPROL-XL) 25 MG 24 hr tablet Take 1 tablet (25 mg total) by mouth daily. 11/24/18   Poulose, Bethel Born, NP  triamterene-hydrochlorothiazide (MAXZIDE-25) 37.5-25 MG tablet Take 1 tablet by mouth daily. 11/24/18   Poulose, Bethel Born, NP    Allergies Penicillins and Shellfish allergy  Family History  Problem Relation Age of Onset  . Cancer Mother 58       liver cancer  . Diabetes Mother   . Hypertension Mother   . Glomerulonephritis Father   . Diabetes Sister   . Glomerulonephritis Sister   . Cancer Sister 43       throat cancer  . Heart disease Sister   . Diabetes Sister   . Diabetes Sister   . COPD Sister   . Diabetes Sister   . Breast cancer Cousin        Currently undergoing systemic chemotherapy (01/2017)  . Varicose Veins Neg Hx  Social History Social History   Tobacco Use  . Smoking status: Never Smoker  . Smokeless tobacco: Never Used  Substance Use Topics  . Alcohol use: No    Alcohol/week: 0.0 standard drinks  . Drug use: No    Review of Systems Constitutional: No fever/chills.  Some generalized weakness and malaise but generally doing well. Eyes: No visual changes. ENT: No sore throat.  Chronic loss of smell for about a year. Cardiovascular: Denies chest pain.  Occasional chest discomfort associated with the cough and shortness of breath.   Respiratory: Shortness of breath and cough, worse with exertion, for about a week. Gastrointestinal: Decreased appetite.  No abdominal pain.  No nausea, no vomiting.  No diarrhea.  No constipation. Genitourinary: Negative for dysuria. Musculoskeletal: Negative for neck pain.  Negative for back pain. Integumentary: Negative for rash. Neurological: Negative for headaches, focal weakness or numbness.   ____________________________________________   PHYSICAL EXAM:  VITAL SIGNS: ED Triage Vitals  Enc Vitals Group     BP 05/07/19  1745 (!) 148/91     Pulse Rate 05/07/19 1745 96     Resp 05/07/19 1745 19     Temp 05/07/19 1745 98.9 F (37.2 C)     Temp Source 05/07/19 1745 Oral     SpO2 05/07/19 1745 97 %     Weight 05/07/19 1746 83.9 kg (185 lb)     Height 05/07/19 1746 1.549 m (5\' 1" )     Head Circumference --      Peak Flow --      Pain Score 05/07/19 1758 0     Pain Loc --      Pain Edu? --      Excl. in [place]? --     Constitutional: Alert and oriented.  Well-appearing, no acute distress. Eyes: Conjunctivae are normal.  Head: Atraumatic. Nose: No congestion/rhinnorhea. Mouth/Throat: Patient is wearing a mask. Neck: No stridor.  No meningeal signs.   Cardiovascular: Normal rate, regular rhythm. Good peripheral circulation. Grossly normal heart sounds. Respiratory: Normal respiratory effort.  No retractions. Gastrointestinal: Soft and nontender. No distention.  Musculoskeletal: No lower extremity tenderness nor edema. No gross deformities of extremities. Neurologic:  Normal speech and language. No gross focal neurologic deficits are appreciated.  Skin:  Skin is warm, dry and intact. Psychiatric: Mood and affect are normal. Speech and behavior are normal.  ____________________________________________   LABS (all labs ordered are listed, but only abnormal results are displayed)  Labs Reviewed  NOVEL CORONAVIRUS, NAA (HOSP ORDER, SEND-OUT TO REF LAB; TAT 18-24 HRS)   ____________________________________________  EKG  No indication for emergent EKG. ____________________________________________  RADIOLOGY Ursula Alert, personally viewed and evaluated these images (plain radiographs) as part of my medical decision making, as well as reviewing the written report by the radiologist.  ED MD interpretation: Left lower lobe pneumonia, no evidence of pulmonary embolism.  Official radiology report(s): CT Angio Chest PE W and/or Wo Contrast  Result Date: 05/07/2019 CLINICAL DATA:  Shortness of breath.   His cancer. EXAM: CT ANGIOGRAPHY CHEST WITH CONTRAST TECHNIQUE: Multidetector CT imaging of the chest was performed using the standard protocol during bolus administration of intravenous contrast. Multiplanar CT image reconstructions and MIPs were obtained to evaluate the vascular anatomy. CONTRAST:  24mL OMNIPAQUE IOHEXOL 350 MG/ML SOLN COMPARISON:  None. FINDINGS: Cardiovascular: Contrast injection is sufficient to demonstrate satisfactory opacification of the pulmonary arteries to the segmental level. There is no pulmonary embolus. The main pulmonary artery is within normal limits for size.  There is no CT evidence of acute right heart strain. The visualized aorta is normal. Heart size is normal, without pericardial effusion. Mediastinum/Nodes: --No mediastinal or hilar lymphadenopathy. --No axillary lymphadenopathy. --No supraclavicular lymphadenopathy. --Normal thyroid gland. --The esophagus is unremarkable Lungs/Pleura: There appears to be developing left lower lobe consolidation. There is no pneumothorax. There is no large pleural effusion. Upper Abdomen: No acute abnormality. Musculoskeletal: There are postsurgical changes of the right breast likely related to prior lumpectomy. There is a subtle sclerotic area in the sternal body, of doubtful clinical significance. Review of the MIP images confirms the above findings. IMPRESSION: 1. No PE identified on this exam. 2. Findings concerning for developing left lower lobe pneumonia. 3. No significant pleural effusion or pneumothorax. 4. Postsurgical changes of the right breast likely related to prior lumpectomy. Electronically Signed   By: Constance Holster M.D.   On: 05/07/2019 19:46    ____________________________________________   PROCEDURES   Procedure(s) performed (including Critical Care):  Procedures   ____________________________________________   INITIAL IMPRESSION / MDM / [place] / ED COURSE  As part of my medical decision  making, I reviewed the following data within the [place] notes reviewed and incorporated, Labs reviewed , Old chart reviewed, Notes from prior ED visits and [place] Controlled Substance Database   Differential diagnosis includes, but is not limited to, community-acquired pneumonia, COVID-19, other nonspecific viral respiratory infection, pulmonary embolism, ACS.  The patient is well-appearing and in no distress after 6 hours of waiting.  Her lab results performed as an outpatient were all reassuring except for an elevated D-dimer.  In retrospect, I believe that this D-dimer is elevated due to her age as well as an acute infection of either community-acquired pneumonia, COVID-19, or both.  I understand that she has been tested previously for COVID-19 but I talked her about retesting and she agreed to another swab.  She knows to check MyChart for the result within the next couple of days.  There is no evidence of pulmonary embolism on CTA chest.  I encouraged her to use the prescriptions for Levaquin and prednisone as prescribed by her doctor and follow-up in about a week with them or otherwise as scheduled.  I also wrote a prescription for an albuterol inhaler for symptomatic improvement for bronchospasm and shortness of breath.  I gave my usual customary return precautions and management recommendations and she understands and agrees with the plan.           ____________________________________________  FINAL CLINICAL IMPRESSION(S) / ED DIAGNOSES  Final diagnoses:  Community acquired pneumonia of left lower lobe of lung  Suspected COVID-19 virus infection     MEDICATIONS GIVEN DURING THIS VISIT:  Medications  sodium chloride 0.9 % bolus 1,000 mL (0 mLs Intravenous Stopped 05/07/19 2259)  iohexol (OMNIPAQUE) 350 MG/ML injection 60 mL (75 mLs Intravenous Contrast Given 05/07/19 1925)     ED Discharge Orders         Ordered    albuterol (VENTOLIN HFA) 108 (90 Base)  MCG/ACT inhaler     05/07/19 2328          *Please note:  Marissa Nelson was evaluated in Emergency Department on 05/07/2019 for the symptoms described in the history of present illness. She was evaluated in the context of the global COVID-19 pandemic, which necessitated consideration that the patient might be at risk for infection with the SARS-CoV-2 virus that causes COVID-19. Institutional protocols and algorithms that pertain to the  evaluation of patients at risk for COVID-19 are in a state of rapid change based on information released by regulatory bodies including the CDC and federal and state organizations. These policies and algorithms were followed during the patient's care in the ED.  Some ED evaluations and interventions may be delayed as a result of limited staffing during the pandemic.*  Note:  This document was prepared using Dragon voice recognition software and may include unintentional dictation errors.   Hinda Kehr, MD 05/07/19 [phone]

## 2019-05-07 NOTE — ED Notes (Signed)
D/w Dr. Joni Fears, no additional orders at this time besides CTA of chest and 1L NS

## 2019-05-07 NOTE — Discharge Instructions (Signed)
As we discussed, your CT scan was reassuring, with no evidence of blood clots in the lungs but an area of infection (pneumonia) in the left lower part of your lungs.  Please take the antibiotics and prednisone prescribed by your regular doctor for the full course of treatment prescribed.  I also sent a prescription to [facility] for an albuterol inhaler that he can use as needed for cough and shortness of breath; it might help open up your lungs little bit and make it easier to breathe.  As we discussed as well, even though you have already tested negative for COVID-19, we sent another swab because sometimes people test negative initially but later test positive.  This is for your own information, and you can check the results in the computer system within the next couple of days, but please continue to quarantine yourself as much as possible and a positive test result does not mean you need to come back to the emergency department unless you develop significantly worsening symptoms or other new symptoms that concern you.  Please continue to try to eat and drink adequately to stay hydrated and to give your body the energy needed to fight off the infection.

## 2019-05-07 NOTE — ED Triage Notes (Addendum)
Pt arrived via POV with reports of shortness of breath for several days, pt reports she was dx with PNA last Sunday. Pt reports she had blood work done and XR today at [facility].  Pt was found to have elevated D-Dimer of 1.5.  Pt denies any leg pain or swelling.  Pt denies any hx of blood clots.  Denies any recent travel.  12/9 pt had COVID contact- pt tested 3x and was negative.  Pt had labs completed at [facility] today and XR that were verified in Care Everywhere.

## 2019-05-08 NOTE — ED Notes (Signed)
Pt ambulatory with a steady gait upon DC

## 2019-05-09 LAB — NOVEL CORONAVIRUS, NAA (HOSP ORDER, SEND-OUT TO REF LAB; TAT 18-24 HRS): SARS-CoV-2, NAA: NOT DETECTED

## 2019-05-21 DIAGNOSIS — J189 Pneumonia, unspecified organism: Secondary | ICD-10-CM | POA: Diagnosis not present

## 2019-05-23 ENCOUNTER — Other Ambulatory Visit: Payer: Self-pay

## 2019-05-23 ENCOUNTER — Encounter: Payer: Self-pay | Admitting: Family Medicine

## 2019-05-23 ENCOUNTER — Ambulatory Visit (INDEPENDENT_AMBULATORY_CARE_PROVIDER_SITE_OTHER): Payer: BC Managed Care – PPO | Admitting: Family Medicine

## 2019-05-23 VITALS — BP 118/78 | HR 76 | Ht 61.0 in | Wt 185.0 lb

## 2019-05-23 DIAGNOSIS — R5383 Other fatigue: Secondary | ICD-10-CM

## 2019-05-23 DIAGNOSIS — I10 Essential (primary) hypertension: Secondary | ICD-10-CM | POA: Diagnosis not present

## 2019-05-23 DIAGNOSIS — R091 Pleurisy: Secondary | ICD-10-CM | POA: Diagnosis not present

## 2019-05-23 DIAGNOSIS — J189 Pneumonia, unspecified organism: Secondary | ICD-10-CM

## 2019-05-23 NOTE — Progress Notes (Signed)
Name: Marissa Nelson   MRN: [id number]    DOB: 08/08/1950   Date:05/23/2019       Progress Note  Subjective:    Chief Complaint  Chief Complaint  Patient presents with  . Flank Pain    a little side pain from an infection she had in lungs  . Shortness of Breath    patient had pneumonia at [facility] and is still recovering    I connected with  Marissa Nelson on 05/23/19 at  1:00 PM EST by telephone and verified that I am speaking with the correct person using two identifiers.   I discussed the limitations, risks, security and privacy concerns of performing an evaluation and management service by telephone and the availability of in person appointments. Staff also discussed with the patient that there may be a patient responsible charge related to this service. Patient Location: home Provider Location: [facility] clinic Additional Individuals present: none  HPI   Pt was dx with CAP around Christmas, went to the walk in clinic at [facility] on 04/30/2019, she had also had a prior exposure at work to women with [place].  She was tested 2-3 times in the month of December and early January. Seen at [facility] walk-in clinic 3 times, initially on 04/30/2019, initially diagnosed with pneumonia she was given tessalon, prednisone and Zpak She returned on 05/07/2019, no change to sx and feeling slightly worse, she had a D-dimer test that was positive she was sent to the hospital for CT angio of her chest which was negative for PE and positive for left lower lobe developing pneumonia, then given levaquin 500 mg 5 days.  She was very fatigued, had fever cough chest pain and shortness of breath which did gradually improve.  F/up visit to walk in clinic 05/21/2019 - breathing much better, but side hurts still and she is still a little fatigued, follow-up chest x-ray at that time showed resolved pneumonia, she is rarely taking her albuterol inhaler and not on any other medications at this time.   She continues to feel gradually better, but continues to have a small twinge of a pain, pleuritic in nature, not constant, she denies any fever chills sweats coughing  Main symptoms continues to be slight fatigue, she did report to me after the start of her visit her blood pressure and heart rate her blood pressure is 118/78, she has lost some weight over the past month and has continued to take all of her blood pressure medications including Maxide and metoprolol, reviewed her flowsheets and her blood pressure tends to bring slightly higher systolic 235-573.  She denies near syncope, palpitations, orthopnea, lower extremity edema.  Her heart rate was 76, denies any concerns of bradycardia, denies lightheadedness but overall does feel mildly fatigued & weak especially with some exertion.  Patient Active Problem List   Diagnosis Date Noted  . Obesity (BMI 30.0-34.9) 02/24/2018  . Vitamin B12 deficiency 05/07/2017  . 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 Arthralgia 10/04/2016  . Prediabetes 06/20/2016  . Chronic kidney disease, stage III (moderate) ([place]) 11/15/2015  . Vitamin D deficiency 10/29/2015  . HLD (hyperlipidemia) 06/15/2015  . Obstructive apnea 06/15/2015  . Chronic left shoulder pain 11/08/2014  . Numbness and tingling in left hand 11/07/2014  . Benign essential HTN 12/07/2006    Social History   Tobacco Use  . Smoking status: Never Smoker  . Smokeless tobacco: Never Used  Substance Use Topics  . Alcohol use:  No    Alcohol/week: 0.0 standard drinks     Current Outpatient Medications:  .  acetaminophen (TYLENOL) 325 MG tablet, Take 650 mg by mouth every 6 (six) hours as needed for mild pain or headache. , Disp: , Rfl:  .  albuterol (VENTOLIN HFA) 108 (90 Base) MCG/ACT inhaler, Inhale 2-4 puffs by mouth every 4 hours as needed for wheezing, cough, and/or shortness of breath, Disp: 8 g, Rfl: 1 .  anastrozole (ARIMIDEX) 1 MG tablet, TAKE ONE TABLET BY MOUTH  EVERY DAY, Disp: 90 tablet, Rfl: 1 .  Ascorbic Acid (VITAMIN C) 1000 MG tablet, Take 1,000 mg by mouth daily., Disp: , Rfl:  .  atorvastatin (LIPITOR) 20 MG tablet, Take 1 tablet (20 mg total) by mouth at bedtime., Disp: 90 tablet, Rfl: 1 .  calcium carbonate (CALCIUM 600) 600 MG TABS tablet, Take 600 mg by mouth 2 (two) times daily with a meal., Disp: , Rfl:  .  Cholecalciferol (VITAMIN D3) 50 MCG (2000 UT) TABS, Take 1 tablet by mouth daily., Disp: , Rfl:  .  Cyanocobalamin (VITAMIN B-12) 500 MCG SUBL, Place under the tongue daily. , Disp: , Rfl:  .  metoprolol succinate (TOPROL-XL) 25 MG 24 hr tablet, Take 1 tablet (25 mg total) by mouth daily., Disp: 90 tablet, Rfl: 1 .  triamterene-hydrochlorothiazide (MAXZIDE-25) 37.5-25 MG tablet, Take 1 tablet by mouth daily., Disp: 90 tablet, Rfl: 1  Allergies  Allergen Reactions  . Penicillins Anaphylaxis  . Shellfish Allergy Swelling    angioedema    Chart Review: I personally reviewed active problem list, medication list, allergies, family history, social history, health maintenance, notes from last encounter, lab results, imaging with the patient/caregiver today.   Review of Systems  Constitutional: Negative.   HENT: Negative.   Eyes: Negative.   Respiratory: Negative.   Cardiovascular: Negative.   Gastrointestinal: Negative.   Endocrine: Negative.   Genitourinary: Negative.   Musculoskeletal: Negative.   Skin: Negative.   Allergic/Immunologic: Negative.   Neurological: Negative.   Hematological: Negative.   Psychiatric/Behavioral: Negative.   All other systems reviewed and are negative.    Objective:    Virtual encounter, vitals limited, only able to obtain the following Today's Vitals   05/23/19 1124  BP: 118/78  Pulse: 76  Weight: 185 lb (83.9 kg)  Height: 5\' 1"  (1.549 m)   Body mass index is 34.96 kg/m. Nursing Note and Vital Signs reviewed.  Physical Exam Pulmonary:     Comments: Able to speak in full and  complete sentences, clear phonation, no audible stridor or wheeze, no coughing heard throughout encounter Neurological:     Mental Status: She is alert.  Psychiatric:        Mood and Affect: Mood normal.        Behavior: Behavior normal.     PE limited by telephone encounter  No results found for this or any previous visit (from the past 72 hour(s)).  Assessment and Plan:     ICD-10-CM   1. Pneumonia of left lower lobe due to infectious organism  J18.9    abx x2, CXR done 2 days ago showed cleared inflitrate, no signs of worsening, can RTW with letter written for her today, red flags reviewed  2. Pleurisy  R09.1    likely residual pleurisy from CAP, expect gradual improvement, con't using inhaler, mucinex, cough meds as needed  3. Other fatigue  R53.83    expected, she is continuing to gradually improve note written for her to  return to work next week  4. Essential hypertension  I10    BP soft relative to baseline, lost weight with illness, cut BP meds in half for the next 1-2 weeks, resume nml dose maxide if SBP>150, then BB   Do suspect that patient's blood pressure medications are little too strong for her recent weight loss and illness she is feeling fatigued but is gradually improving.  She was instructed to cut her blood pressure medications in half.  If systolic blood pressures greater than 150 she will resume Maxide at full dose monitor for the next week If blood pressure continues to be elevated then she will resume metoprolol 25 mg at full dose, but suspect right now that blood pressure is little too soft and it may be contributing towards her decreased energy and her fatigue   -Red flags and when to present for emergency care or RTC including but not limited to new/worsening/un-resolving symptoms, reviewed with patient at time of visit. Follow up and care instructions discussed and provided in AVS. - I discussed the assessment and treatment plan with the patient. The  patient was provided an opportunity to ask questions and all were answered. The patient agreed with the plan and demonstrated an understanding of the instructions.  - The patient was advised to call back or seek an in-person evaluation if the symptoms worsen or if the condition fails to improve as anticipated.  I provided 19 minutes of non-face-to-face time during this encounter.  Delsa Grana, PA-C 05/23/19 1:15 PM

## 2019-05-25 DIAGNOSIS — D631 Anemia in chronic kidney disease: Secondary | ICD-10-CM | POA: Insufficient documentation

## 2019-05-25 DIAGNOSIS — N1832 Chronic kidney disease, stage 3b: Secondary | ICD-10-CM | POA: Diagnosis not present

## 2019-05-25 DIAGNOSIS — I129 Hypertensive chronic kidney disease with stage 1 through stage 4 chronic kidney disease, or unspecified chronic kidney disease: Secondary | ICD-10-CM | POA: Diagnosis not present

## 2019-06-16 ENCOUNTER — Telehealth: Payer: Self-pay | Admitting: *Deleted

## 2019-06-16 ENCOUNTER — Ambulatory Visit: Payer: Self-pay | Admitting: *Deleted

## 2019-06-16 NOTE — Telephone Encounter (Signed)
yes

## 2019-06-16 NOTE — Telephone Encounter (Signed)
Patient would like to know if it is safe to have the covid vaccine if she had pneumonia around Christmas. Patient is also on radiation treatment. Patient requesting to speak with RN.           Returned call to patient regarding her question to take the covid vaccine and radiation treatments. She started back on her treatments on Monday. She is scheduled to get the covid vaccine tomorrow afternoon. [facility] notified and pt is advised to contact her oncologist. She voiced understanding. Routing to [employer]  for review.  Reason for Disposition . [1] Caller requesting NON-URGENT health information AND [2] PCP's office is the best resource  Answer Assessment - Initial Assessment Questions 1. REASON FOR CALL or QUESTION: "What is your reason for calling today?" or "How can I best help you?" or "What question do you have that I can help answer?"     See note  Protocols used: [place]

## 2019-06-16 NOTE — Telephone Encounter (Signed)
Call returned to patient and  advised per Dr Janese Banks that it is fine to get the COVID Vaccine. She thanked e for letting her know

## 2019-06-16 NOTE — Telephone Encounter (Signed)
Patient called and states that she is scheduled to get her first COVID vaccine tomorrow and she is asking if it is safe to get it.

## 2019-06-17 DIAGNOSIS — M25562 Pain in left knee: Secondary | ICD-10-CM | POA: Diagnosis not present

## 2019-06-22 DIAGNOSIS — M1712 Unilateral primary osteoarthritis, left knee: Secondary | ICD-10-CM | POA: Diagnosis not present

## 2019-06-22 DIAGNOSIS — M25462 Effusion, left knee: Secondary | ICD-10-CM | POA: Diagnosis not present

## 2019-06-22 DIAGNOSIS — M25562 Pain in left knee: Secondary | ICD-10-CM | POA: Diagnosis not present

## 2019-06-22 DIAGNOSIS — M76892 Other specified enthesopathies of left lower limb, excluding foot: Secondary | ICD-10-CM | POA: Diagnosis not present

## 2019-08-15 ENCOUNTER — Other Ambulatory Visit: Payer: Self-pay | Admitting: Oncology

## 2019-08-24 DIAGNOSIS — D631 Anemia in chronic kidney disease: Secondary | ICD-10-CM | POA: Diagnosis not present

## 2019-08-24 DIAGNOSIS — E790 Hyperuricemia without signs of inflammatory arthritis and tophaceous disease: Secondary | ICD-10-CM | POA: Diagnosis not present

## 2019-08-24 DIAGNOSIS — N1832 Chronic kidney disease, stage 3b: Secondary | ICD-10-CM | POA: Diagnosis not present

## 2019-08-24 DIAGNOSIS — I129 Hypertensive chronic kidney disease with stage 1 through stage 4 chronic kidney disease, or unspecified chronic kidney disease: Secondary | ICD-10-CM | POA: Diagnosis not present

## 2019-08-25 DIAGNOSIS — E790 Hyperuricemia without signs of inflammatory arthritis and tophaceous disease: Secondary | ICD-10-CM | POA: Insufficient documentation

## 2019-09-21 ENCOUNTER — Telehealth: Payer: Self-pay | Admitting: Family Medicine

## 2019-09-21 NOTE — Chronic Care Management (AMB) (Signed)
  Chronic Care Management   Outreach Note  09/21/2019 Name: [name] MRN: [id number] DOB: 1951-03-29  Marissa Nelson is a 69 y.o. year old female who is a primary care patient of Delsa Grana, [place]. I reached out to Marissa Nelson by phone today in response to a referral sent by [name]'s health plan.     An unsuccessful telephone outreach was attempted today. The patient was referred to the case management team for assistance with care management and care coordination.   Follow Up Plan: A HIPPA compliant phone message was left for the patient providing contact information and requesting a return call.  The care management team will reach out to the patient again over the next 7 days.  If patient returns call to provider office, please advise to call [facility] at [place], [place], [place],  [address] Direct Dial: [phone] [email or web address] Website: [email or web address]

## 2019-09-27 NOTE — Chronic Care Management (AMB) (Signed)
  Chronic Care Management   Outreach Note  09/27/2019 Name: KAITLYNNE WENZ MRN: [id number] DOB: [birth year]  Hulda Humphrey is a 69 y.o. year old female who is a primary care patient of Delsa Grana, [place]. I reached out to Hulda Humphrey by phone today in response to a referral sent by Ms. Caryn Bee Harting's health plan.     A second unsuccessful telephone outreach was attempted today. The patient was referred to the case management team for assistance with care management and care coordination.   Follow Up Plan: A HIPPA compliant phone message was left for the patient providing contact information and requesting a return call.  The care management team will reach out to the patient again over the next 7 days.  If patient returns call to provider office, please advise to call [facility] at [place], [place], [place], [place] [address] Direct Dial: [phone] [email or web address]

## 2019-10-04 NOTE — Chronic Care Management (AMB) (Signed)
  Chronic Care Management   Outreach Note  10/04/2019 Name: IANTHA TITSWORTH MRN: [id number] DOB: 1950-12-30  Hulda Humphrey is a 69 y.o. year old female who is a primary care patient of Delsa Grana, [place]. I reached out to Hulda Humphrey by phone today in response to a referral sent by Ms. Caryn Bee Jacque's health plan.     Third unsuccessful telephone outreach was attempted today. The patient was referred to the case management team for assistance with care management and care coordination. The patient's primary care provider has been notified of our unsuccessful attempts to make or maintain contact with the patient. The care management team is pleased to engage with this patient at any time in the future should he/she be interested in assistance from the care management team.   Follow Up Plan: The care management team is available to follow up with the patient after provider conversation with the patient regarding recommendation for care management engagement and subsequent re-referral to the care management team.   Noreene Larsson, [place], [place], [place] [address] Direct Dial: [phone] [email or web address]

## 2019-10-25 ENCOUNTER — Ambulatory Visit: Payer: BC Managed Care – PPO

## 2019-11-17 ENCOUNTER — Ambulatory Visit (INDEPENDENT_AMBULATORY_CARE_PROVIDER_SITE_OTHER): Payer: BC Managed Care – PPO

## 2019-11-17 DIAGNOSIS — Z0000 Encounter for general adult medical examination without abnormal findings: Secondary | ICD-10-CM

## 2019-11-17 NOTE — Progress Notes (Signed)
Subjective:   Marissa Nelson is a 69 y.o. female who presents for an Initial Medicare Annual Wellness Visit.  Virtual Visit via Telephone Note  I connected with  Marissa Nelson on 11/17/19 at  3:30 PM EDT by telephone and verified that I am speaking with the correct person using two identifiers.  Medicare Annual Wellness visit completed telephonically due to Covid-19 pandemic.   Location: Patient: home Provider: office   I discussed the limitations, risks, security and privacy concerns of performing an evaluation and management service by telephone and the availability of in person appointments. The patient expressed understanding and agreed to proceed.  Unable to perform video visit due to video visit attempted and failed and/or patient does not have video capability.   Some vital signs may be absent or patient reported.   Clemetine Marker, LPN    Review of Systems     Cardiac Risk Factors include: advanced age (>24men, >1 women);dyslipidemia;hypertension;obesity (BMI >30kg/m2)     Objective:    There were no vitals filed for this visit. There is no height or weight on file to calculate BMI.  Advanced Directives 11/17/2019 05/07/2019 03/23/2019 10/21/2018 04/06/2018 03/17/2018 10/06/2017  Does Patient Have a Medical Advance Directive? Yes No;Yes No Yes No No No  Type of Paramedic of [place];Living will Healthcare Power of [place]  Does patient want to make changes to medical advance directive? - - - No - Patient declined - - -  Copy of [place] in Chart? No - copy requested No - copy requested - No - copy requested - - -  Would patient like information on creating a medical advance directive? - No - Patient declined No - Patient declined - No - Patient declined No - Patient declined No - Patient declined    Current Medications (verified) Outpatient Encounter Medications as of 11/17/2019    Medication Sig  . acetaminophen (TYLENOL) 325 MG tablet Take 650 mg by mouth every 6 (six) hours as needed for mild pain or headache.   . albuterol (VENTOLIN HFA) 108 (90 Base) MCG/ACT inhaler Inhale 2-4 puffs by mouth every 4 hours as needed for wheezing, cough, and/or shortness of breath  . anastrozole (ARIMIDEX) 1 MG tablet TAKE ONE TABLET BY MOUTH EVERY DAY  . Ascorbic Acid (VITAMIN C) 1000 MG tablet Take 1,000 mg by mouth daily.  [name] Kitchen atorvastatin (LIPITOR) 20 MG tablet Take 1 tablet (20 mg total) by mouth at bedtime.  . calcium carbonate (CALCIUM 600) 600 MG TABS tablet Take 600 mg by mouth 2 (two) times daily with a meal.  . Cholecalciferol (VITAMIN D3) 50 MCG (2000 UT) TABS Take 1 tablet by mouth daily.  . Cyanocobalamin (VITAMIN B-12) 500 MCG SUBL Place under the tongue daily.   . metoprolol succinate (TOPROL-XL) 25 MG 24 hr tablet Take 1 tablet (25 mg total) by mouth daily.  [name] Kitchen triamterene-hydrochlorothiazide (MAXZIDE-25) 37.5-25 MG tablet Take 1 tablet by mouth daily.   No facility-administered encounter medications on file as of 11/17/2019.    Allergies (verified) Penicillins and Shellfish allergy   History: Past Medical History:  Diagnosis Date  . Abnormal mammogram of right breast 01/17/2016   Recommendation: Six month follow-up diagnostic mammogram of the right breast for an additional group of probably benign Calcifications. Sept 2017 --> due March 2018  . Arthralgia 10/04/2016  . Arthritis    hands  . Benign essential HTN 12/07/2006  .  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 Breast cancer screening 11/15/2015  . Chronic kidney disease, stage II (mild) 11/15/2015  . Chronic left shoulder pain 11/08/2014  . 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 Family history of diabetes mellitus 06/12/2016   Mother and several other relatives  . HLD (hyperlipidemia) 06/15/2015  . Hx of right breast biopsy 01/17/2016  . Hyperlipidemia   .  Hypertension   . Medication monitoring encounter 11/15/2015  . Motion sickness    ocean ships  . Numbness and tingling in left hand 11/07/2014  . Obesity 10/25/2015  . Obesity (BMI 30.0-34.9) 10/25/2015  . Personal history of radiation therapy 2018   right breast ca  . Positive ANA (antinuclear antibody) 10/04/2016  . Prediabetes   . Preventative health care 11/15/2015  . Vitamin D deficiency 10/29/2015   Past Surgical History:  Procedure Laterality Date  . ABDOMINAL HYSTERECTOMY    . BREAST BIOPSY Right 01/15/2016    cylinder shape calcs UOQ, FIBROADENOMATOUS  . BREAST BIOPSY Right 12/30/2016   stereo bx for calcifications: DCIS UOQ, ribbon shaped  . BREAST BIOPSY Right 01/08/2017   LOQ x shaped, FIBROADENOMATOUS CHANGE WITH  . BREAST BIOPSY Left 03/09/2019   calcs bx, x marker path pending  . BREAST LUMPECTOMY Right 01/30/2018   DCIS clear margins  . BREAST LUMPECTOMY WITH NEEDLE LOCALIZATION Right 01/30/2017   Procedure: BREAST LUMPECTOMY WITH NEEDLE LOCALIZATION;  Surgeon: Vickie Epley, MD;  Location: [facility];  Service: General;  Laterality: Right;  . COLONOSCOPY WITH PROPOFOL N/A 11/19/2016   Procedure: COLONOSCOPY WITH PROPOFOL;  Surgeon: Jonathon Bellows, MD;  Location: [place];  Service: Endoscopy;  Laterality: N/A;  . ESOPHAGOGASTRODUODENOSCOPY (EGD) WITH PROPOFOL N/A 11/19/2016   Procedure: ESOPHAGOGASTRODUODENOSCOPY (EGD) WITH PROPOFOL;  Surgeon: Jonathon Bellows, MD;  Location: [facility];  Service: Endoscopy;  Laterality: N/A;  . GIVENS CAPSULE STUDY N/A 12/12/2016   Procedure: GIVENS CAPSULE STUDY;  Surgeon: Jonathon Bellows, MD;  Location[facility];  Service: Gastroenterology;  Laterality: N/A;  . TOTAL MASTECTOMY Right 01/30/2017   Error   Family History  Problem Relation Age of Onset  . Cancer Mother 81       liver cancer  . Diabetes Mother   . Hypertension Mother   . Glomerulonephritis Father   . Diabetes Sister   . Glomerulonephritis Sister   .  Cancer Sister 74       throat cancer  . Heart disease Sister   . Diabetes Sister   . Diabetes Sister   . COPD Sister   . Diabetes Sister   . Breast cancer Cousin        Currently undergoing systemic chemotherapy (01/2017)  . Varicose Veins Neg Hx    Social History   Socioeconomic History  . Marital status: Married    Spouse name: Not on file  . Number of children: 2  . Years of education: Not on file  . Highest education level: Not on file  Occupational History  . Occupation: Works in [employer] [place], [place])    Employer: [employer]  Tobacco Use  . Smoking status: Never Smoker  . Smokeless tobacco: Never Used  Vaping Use  . Vaping Use: Never used  Substance and Sexual Activity  . Alcohol use: No    Alcohol/week: 0.0 standard drinks  . Drug use: No  . Sexual activity: Not Currently  Other Topics  Concern  . Not on file  Social History Narrative  . Not on file   Social Determinants of Health   Financial Resource Strain: Low Risk   . Difficulty of Paying Living Expenses: Not hard at all  Food Insecurity: No Food Insecurity  . Worried About Charity fundraiser in the Last Year: Never true  . Ran Out of Food in the Last Year: Never true  Transportation Needs: No Transportation Needs  . Lack of Transportation (Medical): No  . Lack of Transportation (Non-Medical): No  Physical Activity: Insufficiently Active  . Days of Exercise per Week: 2 days  . Minutes of Exercise per Session: 30 min  Stress: No Stress Concern Present  . Feeling of Stress : Only a little  Social Connections: Moderately Integrated  . Frequency of Communication with Friends and Family: More than three times a week  . Frequency of Social Gatherings with Friends and Family: More than three times a week  . Attends Religious Services: More than 4 times per year  . Active Member of Clubs or Organizations: No  . Attends Archivist Meetings: Never  . Marital Status:  Married    Tobacco Counseling Counseling given: Not Answered   Clinical Intake:  Pre-visit preparation completed: Yes  Pain : No/denies pain     Nutritional Risks: None Diabetes: No  How often do you need to have someone help you when you read instructions, pamphlets, or other written materials from your doctor or pharmacy?: 1 - Never    Interpreter Needed?: No  Information entered by :: Clemetine Marker LPN   Activities of Daily Living In your present state of health, do you have any difficulty performing the following activities: 11/17/2019 05/23/2019  Hearing? N N  Comment declines hearing aids -  Vision? N N  Difficulty concentrating or making decisions? N N  Walking or climbing stairs? N N  Dressing or bathing? N N  Doing errands, shopping? N N  Preparing Food and eating ? N -  Using the Toilet? N -  In the past six months, have you accidently leaked urine? N -  Do you have problems with loss of bowel control? N -  Managing your Medications? N -  Managing your Finances? N -  Housekeeping or managing your Housekeeping? N -  Some recent data might be hidden    Patient Care Team: Delsa Grana, PA-C as PCP - General (Family Medicine)  Indicate any recent Medical Services you may have received from other than [facility] providers in the past year (date may be approximate).     Assessment:   This is a routine wellness examination for [facility].  Hearing/Vision screen  Hearing Screening   125Hz  250Hz  500Hz  1000Hz  2000Hz  3000Hz  4000Hz  6000Hz  8000Hz   Right ear:           Left ear:           Comments: Pt denies hearing difficulty   Vision Screening Comments: Annual vision screenings done at [place] issues and exercise activities discussed: Current Exercise Habits: Home exercise routine, Type of exercise: walking, Time (Minutes): 25, Frequency (Times/Week): 2, Weekly Exercise (Minutes/Week): 50, Intensity: Mild, Exercise limited by: None  identified  Goals    . Weight (lb) < 165 lb (74.8 kg)     Patient states she would like to lose 25 lbs with healthy eating and physical activity       Depression Screen [facility] 2/9 Scores 11/17/2019 05/23/2019 11/24/2018 05/14/2018 02/24/2018  02/24/2018 11/04/2017  PHQ - 2 Score 2 1 0 0 0 0 0  PHQ- 9 Score 8 1 0 0 0 - -    Fall Risk Fall Risk  11/17/2019 05/23/2019 03/15/2019 03/15/2019 11/24/2018  Falls in the past year? 0 0 0 0 0  Number falls in past yr: 0 0 0 0 0  Injury with Fall? 0 0 0 0 0  Risk for fall due to : No Fall Risks - - - -  Follow up Falls prevention discussed - - - -    Any stairs in or around the home? Yes  If so, are there any without handrails? Yes  Home free of loose throw rugs in walkways, pet beds, electrical cords, etc? Yes  Adequate lighting in your home to reduce risk of falls? Yes   ASSISTIVE DEVICES UTILIZED TO PREVENT FALLS:  Life alert? No  Use of a cane, walker or w/c? No  Grab bars in the bathroom? Yes  Shower chair or bench in shower? No  Elevated toilet seat or a handicapped toilet? Yes   TIMED UP AND GO:  Was the test performed? No .   Cognitive Function: 6CIT deferred for 2021 AWV; pt has no memory issues.         Immunizations Immunization History  Administered Date(s) Administered  . Fluad Quad(high Dose 65+) 01/19/2019  . Influenza, High Dose Seasonal PF 03/18/2016, 02/24/2018  . Influenza,inj,Quad PF,6+ Mos 02/10/2017  . Moderna SARS-COVID-2 Vaccination 06/18/2019, 07/16/2019  . Pneumococcal Conjugate-13 11/15/2015  . Pneumococcal Polysaccharide-23 06/08/2017  . Tdap 08/02/2013    TDAP status: Up to date   Flu Vaccine status: Up to date   Pneumococcal vaccine status: Up to date   Covid-19 vaccine status: Completed vaccines  Qualifies for Shingles Vaccine? Yes   Zostavax completed No   Shingrix Completed?: No.    Education has been provided regarding the importance of this vaccine. Patient has been advised to call insurance  company to determine out of pocket expense if they have not yet received this vaccine. Advised may also receive vaccine at local pharmacy or Health Dept. Verbalized acceptance and understanding.  Screening Tests Health Maintenance  Topic Date Due  . INFLUENZA VACCINE  12/04/2019  . MAMMOGRAM  02/28/2020  . TETANUS/TDAP  08/03/2023  . COLONOSCOPY  11/20/2026  . DEXA SCAN  Completed  . COVID-19 Vaccine  Completed  . Hepatitis C Screening  Completed  . PNA vac Low Risk Adult  Completed    Health Maintenance  There are no preventive care reminders to display for this patient.  Colorectal cancer screening: Completed 11/19/16. Repeat every 10 years   Mammogram status: Completed 03/09/19. Repeat every year   Bone Density status: Completed 10/28/17. Results reflect: Bone density results: NORMAL. Repeat every 2 years.  Lung Cancer Screening: (Low Dose CT Chest recommended if Age 46-80 years, 30 pack-year currently smoking OR have quit w/in 15years.) does not qualify.   Additional Screening:  Hepatitis C Screening: does qualify; Completed 11/15/15  Vision Screening: Recommended annual ophthalmology exams for early detection of glaucoma and other disorders of the eye. Is the patient up to date with their annual eye exam?  Yes  Who is the provider or what is the name of the office in which the patient attends annual eye exams? [facility] Screening: Recommended annual dental exams for proper oral hygiene  Community Resource Referral / Chronic Care Management: CRR required this visit?  No   CCM required  this visit?  No      Plan:     I have personally reviewed and noted the following in the patient's chart:   . Medical and social history . Use of alcohol, tobacco or illicit drugs  . Current medications and supplements . Functional ability and status . Nutritional status . Physical activity . Advanced directives . List of other physicians . Hospitalizations,  surgeries, and ER visits in previous 12 months . Vitals . Screenings to include cognitive, depression, and falls . Referrals and appointments  In addition, I have reviewed and discussed with patient certain preventive protocols, quality metrics, and best practice recommendations. A written personalized care plan for preventive services as well as general preventive health recommendations were provided to patient.     Clemetine Marker, LPN   [id number]/[id number]   Nurse Notes: pt doing well and appreciative of visit today. Pt advised due for OV and labs with PCP.

## 2019-11-17 NOTE — Patient Instructions (Signed)
Marissa Nelson , Thank you for taking time to come for your Medicare Wellness Visit. I appreciate your ongoing commitment to your health goals. Please review the following plan we discussed and let me know if I can assist you in the future.   Screening recommendations/referrals: Colonoscopy: done 11/19/16. Due 2028 Mammogram: done 02/28/19 Bone Density: done 10/28/17 Recommended yearly ophthalmology/optometry visit for glaucoma screening and checkup Recommended yearly dental visit for hygiene and checkup  Vaccinations: Influenza vaccine: done 01/19/19 Pneumococcal vaccine: done 06/08/17 Tdap vaccine: done 07/15/13 Shingles vaccine: Shingrix discussed. Please contact your pharmacy for coverage information.  Covid-19:done 06/18/19 & 07/16/19  Advanced directives: Please bring a copy of your health care power of attorney and living will to the office at your convenience.  Conditions/risks identified: Recommend healthy eating and physical activity for desired weight loss.   Next appointment: Follow up in one year for your annual wellness visit    Preventive Care 65 Years and Older, Female Preventive care refers to lifestyle choices and visits with your health care provider that can promote health and wellness. What does preventive care include?  A yearly physical exam. This is also called an annual well check.  Dental exams once or twice a year.  Routine eye exams. Ask your health care provider how often you should have your eyes checked.  Personal lifestyle choices, including:  Daily care of your teeth and gums.  Regular physical activity.  Eating a healthy diet.  Avoiding tobacco and drug use.  Limiting alcohol use.  Practicing safe sex.  Taking low-dose aspirin every day.  Taking vitamin and mineral supplements as recommended by your health care provider. What happens during an annual well check? The services and screenings done by your health care provider during your annual  well check will depend on your age, overall health, lifestyle risk factors, and family history of disease. Counseling  Your health care provider may ask you questions about your:  Alcohol use.  Tobacco use.  Drug use.  Emotional well-being.  Home and relationship well-being.  Sexual activity.  Eating habits.  History of falls.  Memory and ability to understand (cognition).  Work and work Statistician.  Reproductive health. Screening  You may have the following tests or measurements:  Height, weight, and BMI.  Blood pressure.  Lipid and cholesterol levels. These may be checked every 5 years, or more frequently if you are over 44 years old.  Skin check.  Lung cancer screening. You may have this screening every year starting at age 75 if you have a 30-pack-year history of smoking and currently smoke or have quit within the past 15 years.  Fecal occult blood test (FOBT) of the stool. You may have this test every year starting at age 7.  Flexible sigmoidoscopy or colonoscopy. You may have a sigmoidoscopy every 5 years or a colonoscopy every 10 years starting at age 12.  Hepatitis C blood test.  Hepatitis B blood test.  Sexually transmitted disease (STD) testing.  Diabetes screening. This is done by checking your blood sugar (glucose) after you have not eaten for a while (fasting). You may have this done every 1-3 years.  Bone density scan. This is done to screen for osteoporosis. You may have this done starting at age 25.  Mammogram. This may be done every 1-2 years. Talk to your health care provider about how often you should have regular mammograms. Talk with your health care provider about your test results, treatment options, and if necessary, the need  for more tests. Vaccines  Your health care provider may recommend certain vaccines, such as:  Influenza vaccine. This is recommended every year.  Tetanus, diphtheria, and acellular pertussis (Tdap, Td) vaccine.  You may need a Td booster every 10 years.  Zoster vaccine. You may need this after age 26.  Pneumococcal 13-valent conjugate (PCV13) vaccine. One dose is recommended after age 62.  Pneumococcal polysaccharide (PPSV23) vaccine. One dose is recommended after age 84. Talk to your health care provider about which screenings and vaccines you need and how often you need them. This information is not intended to replace advice given to you by your health care provider. Make sure you discuss any questions you have with your health care provider. Document Released: 05/18/2015 Document Revised: 01/09/2016 Document Reviewed: 02/20/2015 Elsevier Interactive Patient Education  2017 [place] Prevention in the Home Falls can cause injuries. They can happen to people of all ages. There are many things you can do to make your home safe and to help prevent falls. What can I do on the outside of my home?  Regularly fix the edges of walkways and driveways and fix any cracks.  Remove anything that might make you trip as you walk through a door, such as a raised step or threshold.  Trim any bushes or trees on the path to your home.  Use bright outdoor lighting.  Clear any walking paths of anything that might make someone trip, such as rocks or tools.  Regularly check to see if handrails are loose or broken. Make sure that both sides of any steps have handrails.  Any raised decks and porches should have guardrails on the edges.  Have any leaves, snow, or ice cleared regularly.  Use sand or salt on walking paths during winter.  Clean up any spills in your garage right away. This includes oil or grease spills. What can I do in the bathroom?  Use night lights.  Install grab bars by the toilet and in the tub and shower. Do not use towel bars as grab bars.  Use non-skid mats or decals in the tub or shower.  If you need to sit down in the shower, use a plastic, non-slip stool.  Keep the  floor dry. Clean up any water that spills on the floor as soon as it happens.  Remove soap buildup in the tub or shower regularly.  Attach bath mats securely with double-sided non-slip rug tape.  Do not have throw rugs and other things on the floor that can make you trip. What can I do in the bedroom?  Use night lights.  Make sure that you have a light by your bed that is easy to reach.  Do not use any sheets or blankets that are too big for your bed. They should not hang down onto the floor.  Have a firm chair that has side arms. You can use this for support while you get dressed.  Do not have throw rugs and other things on the floor that can make you trip. What can I do in the kitchen?  Clean up any spills right away.  Avoid walking on wet floors.  Keep items that you use a lot in easy-to-reach places.  If you need to reach something above you, use a strong step stool that has a grab bar.  Keep electrical cords out of the way.  Do not use floor polish or wax that makes floors slippery. If you must use wax, use  non-skid floor wax.  Do not have throw rugs and other things on the floor that can make you trip. What can I do with my stairs?  Do not leave any items on the stairs.  Make sure that there are handrails on both sides of the stairs and use them. Fix handrails that are broken or loose. Make sure that handrails are as long as the stairways.  Check any carpeting to make sure that it is firmly attached to the stairs. Fix any carpet that is loose or worn.  Avoid having throw rugs at the top or bottom of the stairs. If you do have throw rugs, attach them to the floor with carpet tape.  Make sure that you have a light switch at the top of the stairs and the bottom of the stairs. If you do not have them, ask someone to add them for you. What else can I do to help prevent falls?  Wear shoes that:  Do not have high heels.  Have rubber bottoms.  Are comfortable and fit  you well.  Are closed at the toe. Do not wear sandals.  If you use a stepladder:  Make sure that it is fully opened. Do not climb a closed stepladder.  Make sure that both sides of the stepladder are locked into place.  Ask someone to hold it for you, if possible.  Clearly mark and make sure that you can see:  Any grab bars or handrails.  First and last steps.  Where the edge of each step is.  Use tools that help you move around (mobility aids) if they are needed. These include:  Canes.  Walkers.  Scooters.  Crutches.  Turn on the lights when you go into a dark area. Replace any light bulbs as soon as they burn out.  Set up your furniture so you have a clear path. Avoid moving your furniture around.  If any of your floors are uneven, fix them.  If there are any pets around you, be aware of where they are.  Review your medicines with your doctor. Some medicines can make you feel dizzy. This can increase your chance of falling. Ask your doctor what other things that you can do to help prevent falls. This information is not intended to replace advice given to you by your health care provider. Make sure you discuss any questions you have with your health care provider. Document Released: 02/15/2009 Document Revised: 09/27/2015 Document Reviewed: 05/26/2014 Elsevier Interactive Patient Education  2017 [employer].

## 2019-12-07 DIAGNOSIS — M25562 Pain in left knee: Secondary | ICD-10-CM | POA: Diagnosis not present

## 2019-12-16 DIAGNOSIS — G8929 Other chronic pain: Secondary | ICD-10-CM | POA: Diagnosis not present

## 2019-12-16 DIAGNOSIS — M25562 Pain in left knee: Secondary | ICD-10-CM | POA: Diagnosis not present

## 2019-12-16 DIAGNOSIS — M76892 Other specified enthesopathies of left lower limb, excluding foot: Secondary | ICD-10-CM | POA: Diagnosis not present

## 2019-12-16 DIAGNOSIS — M25462 Effusion, left knee: Secondary | ICD-10-CM | POA: Diagnosis not present

## 2019-12-16 DIAGNOSIS — M1712 Unilateral primary osteoarthritis, left knee: Secondary | ICD-10-CM | POA: Diagnosis not present

## 2020-01-27 ENCOUNTER — Other Ambulatory Visit: Payer: Self-pay

## 2020-01-27 DIAGNOSIS — D0511 Intraductal carcinoma in situ of right breast: Secondary | ICD-10-CM

## 2020-01-27 NOTE — Addendum Note (Signed)
Addended by: Lesly Rubenstein on: 01/27/2020 11:12 AM   Modules accepted: Orders

## 2020-02-03 ENCOUNTER — Ambulatory Visit (INDEPENDENT_AMBULATORY_CARE_PROVIDER_SITE_OTHER): Payer: BC Managed Care – PPO

## 2020-02-03 ENCOUNTER — Other Ambulatory Visit: Payer: Self-pay

## 2020-02-03 DIAGNOSIS — Z23 Encounter for immunization: Secondary | ICD-10-CM

## 2020-02-09 ENCOUNTER — Other Ambulatory Visit: Payer: Self-pay | Admitting: Family Medicine

## 2020-02-09 DIAGNOSIS — I10 Essential (primary) hypertension: Secondary | ICD-10-CM

## 2020-02-09 MED ORDER — TRIAMTERENE-HCTZ 37.5-25 MG PO TABS: 1.0000 | ORAL_TABLET | Freq: Every day | ORAL | 0 refills | Status: DC

## 2020-02-09 NOTE — Telephone Encounter (Signed)
Medication Refill - Medication: Maxzide  Has the patient contacted their pharmacy? No. (Agent: If no, request that the patient contact the pharmacy for the refill.) (Agent: If yes, when and what did the pharmacy advise?)  Preferred Pharmacy (with phone number or street name): [facility] [place], [place]: Please be advised that RX refills may take up to 3 business days. We ask that you follow-up with your pharmacy.

## 2020-02-29 DIAGNOSIS — N1832 Chronic kidney disease, stage 3b: Secondary | ICD-10-CM | POA: Diagnosis not present

## 2020-02-29 DIAGNOSIS — I129 Hypertensive chronic kidney disease with stage 1 through stage 4 chronic kidney disease, or unspecified chronic kidney disease: Secondary | ICD-10-CM | POA: Diagnosis not present

## 2020-02-29 DIAGNOSIS — D631 Anemia in chronic kidney disease: Secondary | ICD-10-CM | POA: Diagnosis not present

## 2020-03-15 ENCOUNTER — Other Ambulatory Visit: Payer: Self-pay

## 2020-03-15 ENCOUNTER — Ambulatory Visit
Admission: RE | Admit: 2020-03-15 | Discharge: 2020-03-15 | Disposition: A | Discharge status: Home / Self Care | Payer: BC Managed Care – PPO | Source: Ambulatory Visit | Attending: Surgery | Admitting: Surgery

## 2020-03-15 DIAGNOSIS — D0511 Intraductal carcinoma in situ of right breast: Secondary | ICD-10-CM | POA: Diagnosis present

## 2020-03-20 ENCOUNTER — Encounter: Payer: Self-pay | Admitting: Radiation Oncology

## 2020-03-21 ENCOUNTER — Ambulatory Visit
Admission: RE | Admit: 2020-03-21 | Discharge: 2020-03-21 | Disposition: A | Discharge status: Home / Self Care | Payer: BC Managed Care – PPO | Source: Ambulatory Visit | Attending: Radiation Oncology | Admitting: Radiation Oncology

## 2020-03-21 ENCOUNTER — Other Ambulatory Visit: Payer: Self-pay

## 2020-03-21 DIAGNOSIS — D0511 Intraductal carcinoma in situ of right breast: Secondary | ICD-10-CM | POA: Diagnosis not present

## 2020-03-21 DIAGNOSIS — Z170 Estrogen receptor positive status [ER+]: Secondary | ICD-10-CM | POA: Insufficient documentation

## 2020-03-21 DIAGNOSIS — Z79811 Long term (current) use of aromatase inhibitors: Secondary | ICD-10-CM | POA: Diagnosis not present

## 2020-03-21 DIAGNOSIS — Z923 Personal history of irradiation: Secondary | ICD-10-CM | POA: Diagnosis not present

## 2020-03-22 NOTE — Progress Notes (Signed)
Radiation Oncology Follow up Note  Name: Marissa Nelson   Date:   03/21/2020 MRN:  [id number] DOB: 1950/06/06    This 69 y.o. 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  She specifically denies breast tenderness cough or bone pain..  She had a mammogram this month which I have reviewed was BI-RADS 2 benign.  She is currently on Arimidex tolerant well without side effect.  COMPLICATIONS OF TREATMENT: none  FOLLOW UP COMPLIANCE: keeps appointments   PHYSICAL EXAM:  BP (!) (P) 144/87 (BP Location: Left Arm, Patient Position: Sitting)   Pulse (P) 79   Temp (!) (P) 96.2 F (35.7 C) (Tympanic)   Resp (P) 16  Lungs are clear to A&P cardiac examination essentially unremarkable with regular rate and rhythm. No dominant mass or nodularity is noted in either breast in 2 positions examined. Incision is well-healed. No axillary or supraclavicular adenopathy is appreciated. Cosmetic result is excellent.  Well-developed well-nourished patient in NAD. HEENT reveals PERLA, EOMI, discs not visualized.  Oral cavity is clear. No oral mucosal lesions are identified. Neck is clear without evidence of cervical or supraclavicular adenopathy. Lungs are clear to A&P. Cardiac examination is essentially unremarkable with regular rate and rhythm without murmur rub or thrill. Abdomen is benign with no organomegaly or masses noted. Motor sensory and DTR levels are equal and symmetric in the upper and lower extremities. Cranial nerves II through XII are grossly intact. Proprioception is intact. No peripheral adenopathy or edema is identified. No motor or sensory levels  are noted. Crude visual fields are within normal range.  RADIOLOGY RESULTS: Mammograms reviewed compatible with above-stated findings  PLAN: Present time patient is doing well with no evidence of disease 3 years out and pleased with her overall progress.  I have asked to see her back in 1 year for follow-up.  She continues on Arimidex without side effect.  Patient knows to call with any concerns.  I would like to take this opportunity to thank you for allowing me to participate in the care of your patient.Noreene Filbert, MD

## 2020-03-23 ENCOUNTER — Ambulatory Visit: Payer: Medicare HMO | Admitting: Surgery

## 2020-04-02 DIAGNOSIS — W010XXA Fall on same level from slipping, tripping and stumbling without subsequent striking against object, initial encounter: Secondary | ICD-10-CM | POA: Diagnosis not present

## 2020-04-02 DIAGNOSIS — M25551 Pain in right hip: Secondary | ICD-10-CM | POA: Diagnosis not present

## 2020-04-02 DIAGNOSIS — M1611 Unilateral primary osteoarthritis, right hip: Secondary | ICD-10-CM | POA: Diagnosis not present

## 2020-04-02 DIAGNOSIS — M76891 Other specified enthesopathies of right lower limb, excluding foot: Secondary | ICD-10-CM | POA: Diagnosis not present

## 2020-04-02 DIAGNOSIS — M7061 Trochanteric bursitis, right hip: Secondary | ICD-10-CM | POA: Diagnosis not present

## 2020-04-04 ENCOUNTER — Encounter: Payer: Self-pay | Admitting: Surgery

## 2020-04-04 ENCOUNTER — Telehealth: Payer: Self-pay

## 2020-04-04 ENCOUNTER — Other Ambulatory Visit: Payer: Self-pay

## 2020-04-04 ENCOUNTER — Ambulatory Visit (INDEPENDENT_AMBULATORY_CARE_PROVIDER_SITE_OTHER): Payer: BC Managed Care – PPO | Admitting: Surgery

## 2020-04-04 VITALS — BP 150/95 | HR 79 | Temp 97.5°F | Ht 61.0 in | Wt 187.2 lb

## 2020-04-04 DIAGNOSIS — I10 Essential (primary) hypertension: Secondary | ICD-10-CM

## 2020-04-04 DIAGNOSIS — D0511 Intraductal carcinoma in situ of right breast: Secondary | ICD-10-CM | POA: Diagnosis not present

## 2020-04-04 NOTE — Telephone Encounter (Signed)
Pt requesting a refill on triamterene. She only have one pill left. She has scheduled an appt for Tuesday 12.7.2021 but will be taking her last pill tomorrow. please send to [facility]

## 2020-04-04 NOTE — Patient Instructions (Signed)
We will call you in Oct 2022 to schedule your mammogram and follow up appointment. If you have not heard from [place] by the end of Oct 2022, please call our office so we can get you scheduled.

## 2020-04-04 NOTE — Progress Notes (Signed)
04/04/2020  History of Present Illness: Marissa Nelson is a 69 y.o. female status post right breast lumpectomy for DCIS in 01/2017 by Dr. Rosana Hoes.  She is status post radiation and is currently on Arimidex.  She presents today for follow-up.  She reports she is been doing very well without any complications.  She denies any palpable masses, skin changes, or nipple changes.  Denies any pain.  She had her recent mammogram on 03/15/2020 which did not show any suspicious findings and was overall negative.  Past Medical History: Past Medical History:  Diagnosis Date  . Abnormal mammogram of right breast 01/17/2016   Recommendation: Six month follow-up diagnostic mammogram of the right breast for an additional group of probably benign Calcifications. Sept 2017 --> due March 2018  . Arthralgia 10/04/2016  . Arthritis    hands  . Benign essential HTN 12/07/2006  . Breast cancer ([place]) 2018   right breast DCIS with rad tx  . Breast cancer screening 11/15/2015  . Chronic kidney disease, stage II (mild) 11/15/2015  . Chronic left shoulder pain 11/08/2014  . 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 Family history of diabetes mellitus 06/12/2016   Mother and several other relatives  . HLD (hyperlipidemia) 06/15/2015  . Hx of right breast biopsy 01/17/2016  . Hyperlipidemia   . Hypertension   . Medication monitoring encounter 11/15/2015  . Motion sickness    ocean ships  . Numbness and tingling in left hand 11/07/2014  . Obesity 10/25/2015  . Obesity (BMI 30.0-34.9) 10/25/2015  . Personal history of radiation therapy 2018   right breast ca  . Positive ANA (antinuclear antibody) 10/04/2016  . Prediabetes   . Preventative health care 11/15/2015  . Vitamin D deficiency 10/29/2015     Past Surgical History: Past Surgical History:  Procedure Laterality Date  . ABDOMINAL HYSTERECTOMY    . BREAST BIOPSY Right 01/15/2016    cylinder shape calcs UOQ, FIBROADENOMATOUS  . BREAST  BIOPSY Right 12/30/2016   stereo bx for calcifications: DCIS UOQ, ribbon shaped  . BREAST BIOPSY Right 01/08/2017   LOQ x shaped, FIBROADENOMATOUS CHANGE WITH  . BREAST BIOPSY Left 03/09/2019   calcs bx, x marker path pending  . BREAST LUMPECTOMY Right 01/30/2018   DCIS clear margins  . BREAST LUMPECTOMY WITH NEEDLE LOCALIZATION Right 01/30/2017   Procedure: BREAST LUMPECTOMY WITH NEEDLE LOCALIZATION;  Surgeon: Vickie Epley, MD;  Location: [facility];  Service: General;  Laterality: Right;  . COLONOSCOPY WITH PROPOFOL N/A 11/19/2016   Procedure: COLONOSCOPY WITH PROPOFOL;  Surgeon: Jonathon Bellows, MD;  Location: [place];  Service: Endoscopy;  Laterality: N/A;  . ESOPHAGOGASTRODUODENOSCOPY (EGD) WITH PROPOFOL N/A 11/19/2016   Procedure: ESOPHAGOGASTRODUODENOSCOPY (EGD) WITH PROPOFOL;  Surgeon: Jonathon Bellows, MD;  Location: [place];  Service: Endoscopy;  Laterality: N/A;  . GIVENS CAPSULE STUDY N/A 12/12/2016   Procedure: GIVENS CAPSULE STUDY;  Surgeon: Jonathon Bellows, MD;  Location: [facility] END[facility]COPY;  Service: Gastroenterology;  Laterality: N/A;  . TOTAL MASTECTOMY Right 01/30/2017   Error    Home Medications: Prior to Admission medications   Medication Sig Start Date End Date Taking? Authorizing Provider  acetaminophen (TYLENOL) 325 MG tablet Take 650 mg by mouth every 6 (six) hours as needed for mild pain or headache.    Yes [provider]  albuterol (VENTOLIN HFA) 108 (90 Base) MCG/ACT inhaler Inhale 2-4 puffs by mouth every 4 hours as needed for wheezing, cough, and/or shortness  of breath 05/07/19  Yes Hinda Kehr, MD  anastrozole (ARIMIDEX) 1 MG tablet TAKE ONE TABLET BY MOUTH EVERY DAY 08/15/19  Yes Sindy Guadeloupe, MD  Ascorbic Acid (VITAMIN C) 1000 MG tablet Take 1,000 mg by mouth daily.   Yes [provider]  atorvastatin (LIPITOR) 20 MG tablet Take 1 tablet (20 mg total) by mouth at bedtime. 11/24/18  Yes Poulose, Bethel Born, NP  calcium carbonate  (CALCIUM 600) 600 MG TABS tablet Take 600 mg by mouth 2 (two) times daily with a meal.   Yes [provider]  Cholecalciferol (VITAMIN D3) 50 MCG (2000 UT) TABS Take 1 tablet by mouth daily.   Yes [provider]  Cyanocobalamin (VITAMIN B-12) 500 MCG SUBL Place under the tongue daily.  05/07/17  Yes Lada, Satira Anis, MD  metoprolol succinate (TOPROL-XL) 25 MG 24 hr tablet Take 1 tablet (25 mg total) by mouth daily. 11/24/18  Yes Poulose, Bethel Born, NP  triamterene-hydrochlorothiazide (MAXZIDE-25) 37.5-25 MG tablet Take 1 tablet by mouth daily. 02/09/20  Yes Delsa Grana, PA-C    Allergies: Allergies  Allergen Reactions  . Penicillins Anaphylaxis  . Shellfish Allergy Swelling    angioedema    Review of Systems: Review of Systems  Constitutional: Negative for chills and fever.  Respiratory: Negative for shortness of breath.   Cardiovascular: Negative for chest pain.  Gastrointestinal: Negative for nausea and vomiting.  Skin: Negative for rash.    Physical Exam BP (!) 150/95   Pulse 79   Temp (!) 97.5 F (36.4 C) (Oral)   Ht 5\' 1"  (1.549 m)   Wt 187 lb 3.2 oz (84.9 kg)   SpO2 97%   BMI 35.37 kg/m  CONSTITUTIONAL: No acute distress HEENT:  Normocephalic, atraumatic, extraocular motion intact. RESPIRATORY: Normal respiratory effort without pathologic use of accessory muscles. CARDIOVASCULAR: Regular rhythm and rate. BREAST: Right breast status post lumpectomy of the upper outer quadrant.  Scars well-healed.  There are no palpable masses, skin changes, or nipple changes.  There is no right axillary or supraclavicular lymphadenopathy.  Left breast without any palpable masses, skin changes, or nipple changes.  No left axillary or supraclavicular lymphadenopathy. NEUROLOGIC:  Motor and sensation is grossly normal.  Cranial nerves are grossly intact. PSYCH:  Alert and oriented to person, place and time. Affect is normal.  Labs/Imaging: Mammogram on  03/15/2020: FINDINGS: Postoperative changes are seen in the right breast. No suspicious mass, microcalcification, or other finding is identified in either breast.  Mammographic images were processed with CAD.  IMPRESSION: No evidence of malignancy in either breast.  RECOMMENDATION: Recommend routine annual diagnostic mammogram in 1 year.  Assessment and Plan: This is a 69 y.o. female status post right breast lumpectomy for DCIS.  -Discussed with patient that her mammogram showed no suspicious findings, and her exam today is reassuring as well.  She has no complaints.  She can follow-up with me in 1 more year with another diagnostic bilateral mammogram.  Face-to-face time spent with the patient and care providers was 15 minutes, with more than 50% of the time spent counseling, educating, and coordinating care of the patient.     Melvyn Neth, [facility]

## 2020-04-05 MED ORDER — TRIAMTERENE-HCTZ 37.5-25 MG PO TABS: 1.0000 | ORAL_TABLET | Freq: Every day | ORAL | 0 refills | Status: DC

## 2020-04-05 NOTE — Telephone Encounter (Signed)
Prescription pend to you for refill

## 2020-04-10 ENCOUNTER — Encounter: Payer: Self-pay | Admitting: Family Medicine

## 2020-04-10 ENCOUNTER — Ambulatory Visit (INDEPENDENT_AMBULATORY_CARE_PROVIDER_SITE_OTHER): Payer: BC Managed Care – PPO | Admitting: Family Medicine

## 2020-04-10 ENCOUNTER — Other Ambulatory Visit: Payer: Self-pay

## 2020-04-10 VITALS — BP 128/84 | HR 83 | Temp 98.2°F | Resp 16 | Ht 61.0 in | Wt 189.3 lb

## 2020-04-10 DIAGNOSIS — R7303 Prediabetes: Secondary | ICD-10-CM | POA: Diagnosis not present

## 2020-04-10 DIAGNOSIS — I10 Essential (primary) hypertension: Secondary | ICD-10-CM

## 2020-04-10 DIAGNOSIS — E790 Hyperuricemia without signs of inflammatory arthritis and tophaceous disease: Secondary | ICD-10-CM | POA: Diagnosis not present

## 2020-04-10 DIAGNOSIS — Z6835 Body mass index (BMI) 35.0-35.9, adult: Secondary | ICD-10-CM

## 2020-04-10 DIAGNOSIS — E782 Mixed hyperlipidemia: Secondary | ICD-10-CM

## 2020-04-10 DIAGNOSIS — Z5181 Encounter for therapeutic drug level monitoring: Secondary | ICD-10-CM | POA: Diagnosis not present

## 2020-04-10 DIAGNOSIS — D631 Anemia in chronic kidney disease: Secondary | ICD-10-CM

## 2020-04-10 DIAGNOSIS — E559 Vitamin D deficiency, unspecified: Secondary | ICD-10-CM | POA: Diagnosis not present

## 2020-04-10 DIAGNOSIS — E538 Deficiency of other specified B group vitamins: Secondary | ICD-10-CM | POA: Diagnosis not present

## 2020-04-10 DIAGNOSIS — N183 Chronic kidney disease, stage 3 unspecified: Secondary | ICD-10-CM | POA: Diagnosis not present

## 2020-04-10 MED ORDER — TRIAMTERENE-HCTZ 37.5-25 MG PO TABS: 1.0000 | ORAL_TABLET | Freq: Every day | ORAL | 3 refills | Status: DC

## 2020-04-10 MED ORDER — ATORVASTATIN CALCIUM 20 MG PO TABS: 20.0000 mg | ORAL_TABLET | Freq: Every day | ORAL | 3 refills | Status: DC

## 2020-04-10 MED ORDER — METOPROLOL SUCCINATE ER 25 MG PO TB24: 25.0000 mg | ORAL_TABLET | Freq: Every day | ORAL | 3 refills | Status: DC

## 2020-04-10 NOTE — Patient Instructions (Signed)
Restart meds, work on diet and lifestyle efforts as able (see handouts and info below) and we will need to see you back soon to recheck meds, vitals, labs etc to make sure everything is well controlled, tolerated and safe.   DASH Eating Plan DASH stands for "Dietary Approaches to Stop Hypertension." The DASH eating plan is a healthy eating plan that has been shown to reduce high blood pressure (hypertension). It may also reduce your risk for type 2 diabetes, heart disease, and stroke. The DASH eating plan may also help with weight loss. What are tips for following this plan?  General guidelines  Avoid eating more than 2,300 mg (milligrams) of salt (sodium) a day. If you have hypertension, you may need to reduce your sodium intake to 1,500 mg a day.  Limit alcohol intake to no more than 1 drink a day for nonpregnant women and 2 drinks a day for men. One drink equals 12 oz of beer, 5 oz of wine, or 1 oz of hard liquor.  Work with your health care provider to maintain a healthy body weight or to lose weight. Ask what an ideal weight is for you.  Get at least 30 minutes of exercise that causes your heart to beat faster (aerobic exercise) most days of the week. Activities may include walking, swimming, or biking.  Work with your health care provider or diet and nutrition specialist (dietitian) to adjust your eating plan to your individual calorie needs. Reading food labels   Check food labels for the amount of sodium per serving. Choose foods with less than 5 percent of the Daily Value of sodium. Generally, foods with less than 300 mg of sodium per serving fit into this eating plan.  To find whole grains, look for the word "whole" as the first word in the ingredient list. Shopping  Buy products labeled as "low-sodium" or "no salt added."  Buy fresh foods. Avoid canned foods and premade or frozen meals. Cooking  Avoid adding salt when cooking. Use salt-free seasonings or herbs instead of  table salt or sea salt. Check with your health care provider or pharmacist before using salt substitutes.  Do not fry foods. Cook foods using healthy methods such as baking, boiling, grilling, and broiling instead.  Cook with heart-healthy oils, such as olive, canola, soybean, or sunflower oil. Meal planning  Eat a balanced diet that includes: ? 5 or more servings of fruits and vegetables each day. At each meal, try to fill half of your plate with fruits and vegetables. ? Up to 6-8 servings of whole grains each day. ? Less than 6 oz of lean meat, poultry, or fish each day. A 3-oz serving of meat is about the same size as a deck of cards. One egg equals 1 oz. ? 2 servings of low-fat dairy each day. ? A serving of nuts, seeds, or beans 5 times each week. ? Heart-healthy fats. Healthy fats called Omega-3 fatty acids are found in foods such as flaxseeds and coldwater fish, like sardines, salmon, and mackerel.  Limit how much you eat of the following: ? Canned or prepackaged foods. ? Food that is high in trans fat, such as fried foods. ? Food that is high in saturated fat, such as fatty meat. ? Sweets, desserts, sugary drinks, and other foods with added sugar. ? Full-fat dairy products.  Do not salt foods before eating.  Try to eat at least 2 vegetarian meals each week.  Eat more home-cooked food and less restaurant,  buffet, and fast food.  When eating at a restaurant, ask that your food be prepared with less salt or no salt, if possible. What foods are recommended? The items listed may not be a complete list. Talk with your dietitian about what dietary choices are best for you. Grains Whole-grain or whole-wheat bread. Whole-grain or whole-wheat pasta. Brown rice. [place]. Bulgur. Whole-grain and low-sodium cereals. Pita bread. Low-fat, low-sodium crackers. Whole-wheat flour tortillas. Vegetables Fresh or frozen vegetables (raw, steamed, roasted, or grilled). Low-sodium or  reduced-sodium tomato and vegetable juice. Low-sodium or reduced-sodium tomato sauce and tomato paste. Low-sodium or reduced-sodium canned vegetables. Fruits All fresh, dried, or frozen fruit. Canned fruit in natural juice (without added sugar). Meat and other protein foods Skinless chicken or [place]. Ground chicken or [place]. Pork with fat trimmed off. Fish and seafood. Egg whites. Dried beans, peas, or lentils. Unsalted nuts, nut butters, and seeds. Unsalted canned beans. Lean cuts of beef with fat trimmed off. Low-sodium, lean deli meat. Dairy Low-fat (1%) or fat-free (skim) milk. Fat-free, low-fat, or reduced-fat cheeses. Nonfat, low-sodium ricotta or cottage cheese. Low-fat or nonfat yogurt. Low-fat, low-sodium cheese. Fats and oils Soft margarine without trans fats. Vegetable oil. Low-fat, reduced-fat, or light mayonnaise and salad dressings (reduced-sodium). Canola, safflower, olive, soybean, and sunflower oils. Avocado. Seasoning and other foods Herbs. Spices. Seasoning mixes without salt. Unsalted popcorn and pretzels. Fat-free sweets. What foods are not recommended? The items listed may not be a complete list. Talk with your dietitian about what dietary choices are best for you. Grains Baked goods made with fat, such as croissants, muffins, or some breads. Dry pasta or rice meal packs. Vegetables Creamed or fried vegetables. Vegetables in a cheese sauce. Regular canned vegetables (not low-sodium or reduced-sodium). Regular canned tomato sauce and paste (not low-sodium or reduced-sodium). Regular tomato and vegetable juice (not low-sodium or reduced-sodium). Angie Fava. Olives. Fruits Canned fruit in a light or heavy syrup. Fried fruit. Fruit in cream or butter sauce. Meat and other protein foods Fatty cuts of meat. Ribs. Fried meat. Berniece Salines. Sausage. Bologna and other processed lunch meats. Salami. Fatback. Hotdogs. Bratwurst. Salted nuts and seeds. Canned beans with added salt. Canned or  smoked fish. Whole eggs or egg yolks. Chicken or [place] with skin. Dairy Whole or 2% milk, cream, and half-and-half. Whole or full-fat cream cheese. Whole-fat or sweetened yogurt. Full-fat cheese. Nondairy creamers. Whipped toppings. Processed cheese and cheese spreads. Fats and oils Butter. Stick margarine. Lard. Shortening. Ghee. Bacon fat. Tropical oils, such as coconut, palm kernel, or palm oil. Seasoning and other foods Salted popcorn and pretzels. Onion salt, garlic salt, seasoned salt, table salt, and sea salt. Worcestershire sauce. Tartar sauce. Barbecue sauce. Teriyaki sauce. Soy sauce, including reduced-sodium. Steak sauce. Canned and packaged gravies. Fish sauce. Oyster sauce. Cocktail sauce. Horseradish that you find on the shelf. Ketchup. Mustard. Meat flavorings and tenderizers. Bouillon cubes. Hot sauce and Tabasco sauce. Premade or packaged marinades. Premade or packaged taco seasonings. Relishes. Regular salad dressings. Where to find more information:  National Heart, Lung, and [place]: https://wilson-eaton.com/  American Heart Association: www.heart.org Summary  The DASH eating plan is a healthy eating plan that has been shown to reduce high blood pressure (hypertension). It may also reduce your risk for type 2 diabetes, heart disease, and stroke.  With the DASH eating plan, you should limit salt (sodium) intake to 2,300 mg a day. If you have hypertension, you may need to reduce your sodium intake to 1,500 mg a day.  When  on the DASH eating plan, aim to eat more fresh fruits and vegetables, whole grains, lean proteins, low-fat dairy, and heart-healthy fats.  Work with your health care provider or diet and nutrition specialist (dietitian) to adjust your eating plan to your individual calorie needs. This information is not intended to replace advice given to you by your health care provider. Make sure you discuss any questions you have with your health care provider. Document  Revised: 04/03/2017 Document Reviewed: 04/14/2016 Elsevier Patient Education  [place].    High Cholesterol  High cholesterol is a condition in which the blood has high levels of a white, waxy, fat-like substance (cholesterol). The human body needs small amounts of cholesterol. The liver makes all the cholesterol that the body needs. Extra (excess) cholesterol comes from the food that we eat. Cholesterol is carried from the liver by the blood through the blood vessels. If you have high cholesterol, deposits (plaques) may build up on the walls of your blood vessels (arteries). Plaques make the arteries narrower and stiffer. Cholesterol plaques increase your risk for heart attack and stroke. Work with your health care provider to keep your cholesterol levels in a healthy range. What increases the risk? This condition is more likely to develop in people who:  Eat foods that are high in animal fat (saturated fat) or cholesterol.  Are overweight.  Are not getting enough exercise.  Have a family history of high cholesterol. What are the signs or symptoms? There are no symptoms of this condition. How is this diagnosed? This condition may be diagnosed from the results of a blood test.  If you are older than age 55, your health care provider may check your cholesterol every 4-6 years.  You may be checked more often if you already have high cholesterol or other risk factors for heart disease. The blood test for cholesterol measures:  "Bad" cholesterol (LDL cholesterol). This is the main type of cholesterol that causes heart disease. The desired level for LDL is less than 100.  "Good" cholesterol (HDL cholesterol). This type helps to protect against heart disease by cleaning the arteries and carrying the LDL away. The desired level for HDL is 60 or higher.  Triglycerides. These are fats that the body can store or burn for energy. The desired number for triglycerides is lower than  150.  Total cholesterol. This is a measure of the total amount of cholesterol in your blood, including LDL cholesterol, HDL cholesterol, and triglycerides. A healthy number is less than 200. How is this treated? This condition is treated with diet changes, lifestyle changes, and medicines. Diet changes  This may include eating more whole grains, fruits, vegetables, nuts, and fish.  This may also include cutting back on red meat and foods that have a lot of added sugar. Lifestyle changes  Changes may include getting at least 40 minutes of aerobic exercise 3 times a week. Aerobic exercises include walking, biking, and swimming. Aerobic exercise along with a healthy diet can help you maintain a healthy weight.  Changes may also include quitting smoking. Medicines  Medicines are usually given if diet and lifestyle changes have failed to reduce your cholesterol to healthy levels.  Your health care provider may prescribe a statin medicine. Statin medicines have been shown to reduce cholesterol, which can reduce the risk of heart disease. Follow these instructions at home: Eating and drinking If told by your health care provider:  Eat chicken (without skin), fish, veal, shellfish, ground [place] breast, and  round or loin cuts of red meat.  Do not eat fried foods or fatty meats, such as hot dogs and salami.  Eat plenty of fruits, such as apples.  Eat plenty of vegetables, such as broccoli, potatoes, and carrots.  Eat beans, peas, and lentils.  Eat grains such as barley, rice, couscous, and bulgur wheat.  Eat pasta without cream sauces.  Use skim or nonfat milk, and eat low-fat or nonfat yogurt and cheeses.  Do not eat or drink whole milk, cream, ice cream, egg yolks, or hard cheeses.  Do not eat stick margarine or tub margarines that contain trans fats (also called partially hydrogenated oils).  Do not eat saturated tropical oils, such as coconut oil and palm oil.  Do not eat  cakes, cookies, crackers, or other baked goods that contain trans fats.  General instructions  Exercise as directed by your health care provider. Increase your activity level with activities such as gardening, walking, and taking the stairs.  Take over-the-counter and prescription medicines only as told by your health care provider.  Do not use any products that contain nicotine or tobacco, such as cigarettes and e-cigarettes. If you need help quitting, ask your health care provider.  Keep all follow-up visits as told by your health care provider. This is important. Contact a health care provider if:  You are struggling to maintain a healthy diet or weight.  You need help to start on an exercise program.  You need help to stop smoking. Get help right away if:  You have chest pain.  You have trouble breathing. This information is not intended to replace advice given to you by your health care provider. Make sure you discuss any questions you have with your health care provider. Document Revised: 04/24/2017 Document Reviewed: 10/20/2015 Elsevier Patient Education  [place].

## 2020-04-10 NOTE — Progress Notes (Signed)
Name: Marissa Nelson   MRN: [id number]    DOB: 15-Mar-1951   Date:04/10/2020       Progress Note  Chief Complaint  Patient presents with  . Hyperlipidemia  . Hypertension     Subjective:   Marissa Nelson is a 69 y.o. female, presents to clinic for routine f/up  Hypertension:  Currently managed on metoprolol and triamterene hydrochlorothiazide, patient has been taking full dose of both tablets, she was ill earlier this year and did a virtual appointment had noted lower blood pressures and was temporarily taking a lower dose but she states today that her blood pressure continued to increase and she returned to her normal medicines Pt reports good med compliance and denies any SE.   Blood pressure today is well controlled. BP Readings from Last 10 Encounters:  04/10/20 128/84  04/04/20 (!) 150/95  03/21/20 (!) (P) 144/87  05/23/19 118/78  05/07/19 128/78  03/23/19 (!) 146/81  03/15/19 139/82  11/24/18 125/83  07/20/18 134/83  05/14/18 122/78   Pt denies CP, SOB, exertional sx, LE edema, palpitation, Ha's, visual disturbances, lightheadedness, hypotension, syncope.  She does see nephrology for CKD -last nephrology office visit and labs were reviewed today Renal function recent labs: Per care everywhere: Oct 2021 GFR 49 Lab Results  Component Value Date   GFRAA 45 (L) 05/12/2018   GFRAA 63 11/13/2017   GFRAA 68 11/04/2017    Lab Results  Component Value Date   CREATININE 1.37 (H) 05/12/2018   BUN 28 (H) 05/12/2018   NA 140 05/12/2018   K 4.4 05/12/2018   CL 102 05/12/2018   CO2 27 05/12/2018    Hyperlipidemia: Currently treated with Lipitor 20 mg, pt reports inconsistent med compliance-she forgets her medications because of the nighttime dosing she may be takes it 3 days out of a week Last Lipids: Lab Results  Component Value Date   CHOL 220 (H) 05/12/2018   HDL 56 05/12/2018   LDLCALC 146 (H) 05/12/2018   TRIG 80 05/12/2018   CHOLHDL 3.9 05/12/2018    - Denies: Chest pain, shortness of breath, myalgias, claudication  Obesity: Wt Readings from Last 5 Encounters:  04/10/20 189 lb 4.8 oz (85.9 kg)  04/04/20 187 lb 3.2 oz (84.9 kg)  05/23/19 185 lb (83.9 kg)  05/07/19 185 lb (83.9 kg)  03/23/19 191 lb 14.4 oz (87 kg)   BMI Readings from Last 5 Encounters:  04/10/20 35.77 kg/m  04/04/20 35.37 kg/m  05/23/19 34.96 kg/m  05/07/19 34.96 kg/m  03/23/19 36.26 kg/m      Current Outpatient Medications:  .  acetaminophen (TYLENOL) 325 MG tablet, Take 650 mg by mouth every 6 (six) hours as needed for mild pain or headache. , Disp: , Rfl:  .  albuterol (VENTOLIN HFA) 108 (90 Base) MCG/ACT inhaler, Inhale 2-4 puffs by mouth every 4 hours as needed for wheezing, cough, and/or shortness of breath, Disp: 8 g, Rfl: 1 .  anastrozole (ARIMIDEX) 1 MG tablet, TAKE ONE TABLET BY MOUTH EVERY DAY, Disp: 90 tablet, Rfl: 1 .  Ascorbic Acid (VITAMIN C) 1000 MG tablet, Take 1,000 mg by mouth daily., Disp: , Rfl:  .  atorvastatin (LIPITOR) 20 MG tablet, Take 1 tablet (20 mg total) by mouth at bedtime., Disp: 90 tablet, Rfl: 1 .  calcium carbonate (CALCIUM 600) 600 MG TABS tablet, Take 600 mg by mouth 2 (two) times daily with a meal., Disp: , Rfl:  .  Cholecalciferol (VITAMIN D3) 50 MCG (2000 UT)  TABS, Take 1 tablet by mouth daily., Disp: , Rfl:  .  Cyanocobalamin (VITAMIN B-12) 500 MCG SUBL, Place under the tongue daily. , Disp: , Rfl:  .  metoprolol succinate (TOPROL-XL) 25 MG 24 hr tablet, Take 1 tablet (25 mg total) by mouth daily., Disp: 90 tablet, Rfl: 1 .  triamterene-hydrochlorothiazide (MAXZIDE-25) 37.5-25 MG tablet, Take 1 tablet by mouth daily., Disp: 7 tablet, Rfl: 0  Patient Active Problem List   Diagnosis Date Noted  . Hyperuricemia 08/25/2019  . Anemia in chronic kidney disease 05/25/2019  . Benign hypertensive kidney disease with chronic kidney disease 05/25/2019  . Obesity (BMI 30.0-34.9) 02/24/2018  . Vitamin B12 deficiency  05/07/2017  . 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 Arthralgia 10/04/2016  . Prediabetes 06/20/2016  . Chronic kidney disease, stage III (moderate) ([place]) 11/15/2015  . Vitamin D deficiency 10/29/2015  . HLD (hyperlipidemia) 06/15/2015  . Obstructive apnea 06/15/2015  . Chronic left shoulder pain 11/08/2014  . Numbness and tingling in left hand 11/07/2014  . Benign essential HTN 12/07/2006    Past Surgical History:  Procedure Laterality Date  . ABDOMINAL HYSTERECTOMY    . BREAST BIOPSY Right 01/15/2016    cylinder shape calcs UOQ, FIBROADENOMATOUS  . BREAST BIOPSY Right 12/30/2016   stereo bx for calcifications: DCIS UOQ, ribbon shaped  . BREAST BIOPSY Right 01/08/2017   LOQ x shaped, FIBROADENOMATOUS CHANGE WITH  . BREAST BIOPSY Left 03/09/2019   calcs bx, x marker path pending  . BREAST LUMPECTOMY Right 01/30/2018   DCIS clear margins  . BREAST LUMPECTOMY WITH NEEDLE LOCALIZATION Right 01/30/2017   Procedure: BREAST LUMPECTOMY WITH NEEDLE LOCALIZATION;  Surgeon: Vickie Epley, MD;  Location: [facility];  Service: General;  Laterality: Right;  . COLONOSCOPY WITH PROPOFOL N/A 11/19/2016   Procedure: COLONOSCOPY WITH PROPOFOL;  Surgeon: Jonathon Bellows, MD;  Location: [place];  Service: Endoscopy;  Laterality: N/A;  . ESOPHAGOGASTRODUODENOSCOPY (EGD) WITH PROPOFOL N/A 11/19/2016   Procedure: ESOPHAGOGASTRODUODENOSCOPY (EGD) WITH PROPOFOL;  Surgeon: Jonathon Bellows, MD;  Location: [place];  Service: Endoscopy;  Laterality: N/A;  . GIVENS CAPSULE STUDY N/A 12/12/2016   Procedure: GIVENS CAPSULE STUDY;  Surgeon: Jonathon Bellows, MD;  Location: [facility] END[facility];  Service: Gastroenterology;  Laterality: N/A;  . TOTAL MASTECTOMY Right 01/30/2017   Error    Family History  Problem Relation Age of Onset  . Cancer Mother 19       liver cancer  . Diabetes Mother   . Hypertension Mother   . Glomerulonephritis Father   . Diabetes Sister   . Glomerulonephritis  Sister   . Cancer Sister 85       throat cancer  . Heart disease Sister   . Diabetes Sister   . Diabetes Sister   . COPD Sister   . Diabetes Sister   . Breast cancer Cousin        Currently undergoing systemic chemotherapy (01/2017)  . Varicose Veins Neg Hx     Social History   Tobacco Use  . Smoking status: Never Smoker  . Smokeless tobacco: Never Used  Vaping Use  . Vaping Use: Never used  Substance Use Topics  . Alcohol use: No    Alcohol/week: 0.0 standard drinks  . Drug use: No     Allergies  Allergen Reactions  . Penicillins Anaphylaxis  . Shellfish Allergy Swelling    angioedema    Health Maintenance  Topic Date Due  . MAMMOGRAM  03/15/2021  . TETANUS/TDAP  08/03/2023  . COLONOSCOPY  11/20/2026  . INFLUENZA VACCINE  Completed  . DEXA SCAN  Completed  . COVID-19 Vaccine  Completed  . Hepatitis C Screening  Completed  . PNA vac Low Risk Adult  Completed    Chart Review Today: I personally reviewed active problem list, medication list, allergies, family history, social history, health maintenance, notes from last encounter, lab results, imaging with the patient/caregiver today.   Review of Systems  10 Systems reviewed and are negative for acute change except as noted in the HPI.  Objective:   Vitals:   04/10/20 1418  BP: 128/84  Pulse: 83  Resp: 16  Temp: 98.2 F (36.8 C)  TempSrc: Oral  SpO2: 97%  Weight: 189 lb 4.8 oz (85.9 kg)  Height: 5\' 1"  (1.549 m)    Body mass index is 35.77 kg/m.  Physical Exam Vitals and nursing note reviewed.  Constitutional:      General: She is not in acute distress.    Appearance: Normal appearance. She is well-developed. She is obese. She is not ill-appearing, toxic-appearing or diaphoretic.     Interventions: Face mask in place.  HENT:     Head: Normocephalic and atraumatic.     Right Ear: External ear normal.     Left Ear: External ear normal.  Eyes:     General: Lids are normal. No scleral icterus.        Right eye: No discharge.        Left eye: No discharge.     Conjunctiva/sclera: Conjunctivae normal.  Neck:     Trachea: Phonation normal. No tracheal deviation.  Cardiovascular:     Rate and Rhythm: Normal rate and regular rhythm.     Pulses: Normal pulses.          Radial pulses are 2+ on the right side and 2+ on the left side.       Posterior tibial pulses are 2+ on the right side and 2+ on the left side.     Heart sounds: Normal heart sounds. No murmur heard.  No friction rub. No gallop.   Pulmonary:     Effort: Pulmonary effort is normal. No respiratory distress.     Breath sounds: Normal breath sounds. No stridor. No wheezing, rhonchi or rales.  Chest:     Chest wall: No tenderness.  Abdominal:     General: Bowel sounds are normal. There is no distension.     Palpations: Abdomen is soft.  Musculoskeletal:     Right lower leg: No edema.     Left lower leg: No edema.  Skin:    General: Skin is warm and dry.     Coloration: Skin is not jaundiced or pale.     Findings: No rash.  Neurological:     Mental Status: She is alert.     Motor: No abnormal muscle tone.     Gait: Gait normal.  Psychiatric:        Mood and Affect: Mood normal.        Speech: Speech normal.        Behavior: Behavior normal.         Assessment & Plan:     ICD-10-CM   1. Benign essential HTN  I10 triamterene-hydrochlorothiazide (MAXZIDE-25) 37.5-25 MG tablet    metoprolol succinate (TOPROL-XL) 25 MG 24 hr tablet    COMPLETE METABOLIC PANEL WITH GFR    Comprehensive metabolic panel   well controlled and BP at goal today, back on  full dose maxide and metoprolol w/o Sx or SE, recheck labs, meds refilled  2. Mixed hyperlipidemia  E78.2 atorvastatin (LIPITOR) 20 MG tablet    metoprolol succinate (TOPROL-XL) 25 MG 24 hr tablet    COMPLETE METABOLIC PANEL WITH GFR    Lipid panel    Comprehensive metabolic panel    CANCELED: Lipid panel   seems that she would have been out of meds a long time  ago, she reports she is not, though she is taking only ~3 doses per week, recheck labs  3. Vitamin B12 deficiency  E53.8 CBC with Differential/Platelet    CANCELED: CBC with Differential/Platelet   Patient supplements over-the-counter per recommendations of the past PCP  4. Class 2 severe obesity with serious comorbidity and body mass index (BMI) of 35.0 to 35.9 in adult, unspecified obesity type (HCC)  J82.50 COMPLETE METABOLIC PANEL WITH GFR   Z68.35 Lipid panel    Hemoglobin A1c  5. Hyperuricemia  E79.0 Comprehensive metabolic panel   History of hyperuricemia no current or recent symptoms concerning for recurrent gout  6. Anemia in stage 3 chronic kidney disease, unspecified whether stage 3a or 3b CKD (HCC)  N39.76 COMPLETE METABOLIC PANEL WITH GFR   D63.1 CBC with Differential/Platelet    Comprehensive metabolic panel   Managed by nephrology, reviewed most recent office visit and labs  7. Prediabetes  B34.19 COMPLETE METABOLIC PANEL WITH GFR    Hemoglobin A1c    Comprehensive metabolic panel    CANCELED: Hemoglobin A1c   Labs from nearly 2 years ago show prediabetes, recheck labs today  8. Vitamin D deficiency  F79.0 COMPLETE METABOLIC PANEL WITH GFR   Patient on vitamin D supplement  9. Encounter for medication monitoring  W40.97 COMPLETE METABOLIC PANEL WITH GFR    Lipid panel    Hemoglobin A1c    CBC with Differential/Platelet    Comprehensive metabolic panel    CANCELED: CBC with Differential/Platelet    CANCELED: Lipid panel    CANCELED: Hemoglobin A1c     Return in about 6 months (around 10/09/2020) for Routine follow-up.   Delsa Grana, PA-C 04/10/20 2:26 PM

## 2020-04-11 LAB — CBC WITH DIFFERENTIAL/PLATELET
Absolute Monocytes: 588 cells/uL (ref 200–950)
Basophils Absolute: 57 cells/uL (ref 0–200)
Basophils Relative: 0.5 %
Eosinophils Absolute: 34 cells/uL (ref 15–500)
Eosinophils Relative: 0.3 %
HCT: 34.1 % — ABNORMAL LOW (ref 35.0–45.0)
Hemoglobin: 11.4 g/dL — ABNORMAL LOW (ref 11.7–15.5)
Lymphs Abs: 1435 cells/uL (ref 850–3900)
MCH: 31.8 pg (ref 27.0–33.0)
MCHC: 33.4 g/dL (ref 32.0–36.0)
MCV: 95 fL (ref 80.0–100.0)
MPV: 11.8 fL (ref 7.5–12.5)
Monocytes Relative: 5.2 %
Neutro Abs: 9187 cells/uL — ABNORMAL HIGH (ref 1500–7800)
Neutrophils Relative %: 81.3 %
Platelets: 241 10*3/uL (ref 140–400)
RBC: 3.59 10*6/uL — ABNORMAL LOW (ref 3.80–5.10)
RDW: 12.6 % (ref 11.0–15.0)
Total Lymphocyte: 12.7 %
WBC: 11.3 10*3/uL — ABNORMAL HIGH (ref 3.8–10.8)

## 2020-04-11 LAB — HEMOGLOBIN A1C
Hgb A1c MFr Bld: 6 % of total Hgb — ABNORMAL HIGH (ref <5.7)
Mean Plasma Glucose: 126 mg/dL
eAG (mmol/L): 7 mmol/L

## 2020-04-11 LAB — COMPLETE METABOLIC PANEL WITH GFR
AG Ratio: 1.5 (calc) (ref 1.0–2.5)
ALT: 13 U/L (ref 6–29)
AST: 14 U/L (ref 10–35)
Albumin: 4 g/dL (ref 3.6–5.1)
Alkaline phosphatase (APISO): 76 U/L (ref 37–153)
BUN/Creatinine Ratio: 18 (calc) (ref 6–22)
BUN: 22 mg/dL (ref 7–25)
CO2: 28 mmol/L (ref 20–32)
Calcium: 10 mg/dL (ref 8.6–10.4)
Chloride: 107 mmol/L (ref 98–110)
Creat: 1.22 mg/dL — ABNORMAL HIGH (ref 0.60–0.93)
GFR, Est African American: 52 mL/min/{1.73_m2} — ABNORMAL LOW (ref >=60)
GFR, Est Non African American: 45 mL/min/{1.73_m2} — ABNORMAL LOW (ref >=60)
Globulin: 2.7 g/dL (calc) (ref 1.9–3.7)
Glucose, Bld: 94 mg/dL (ref 65–99)
Potassium: 5.1 mmol/L (ref 3.5–5.3)
Sodium: 144 mmol/L (ref 135–146)
Total Bilirubin: 0.2 mg/dL (ref 0.2–1.2)
Total Protein: 6.7 g/dL (ref 6.1–8.1)

## 2020-04-11 LAB — LIPID PANEL
Cholesterol: 201 mg/dL — ABNORMAL HIGH (ref <200)
HDL: 64 mg/dL (ref >=50)
LDL Cholesterol (Calc): 111 mg/dL (calc) — ABNORMAL HIGH
Non-HDL Cholesterol (Calc): 137 mg/dL (calc) — ABNORMAL HIGH (ref <130)
Total CHOL/HDL Ratio: 3.1 (calc) (ref <5.0)
Triglycerides: 143 mg/dL (ref <150)

## 2020-05-02 DIAGNOSIS — M7061 Trochanteric bursitis, right hip: Secondary | ICD-10-CM | POA: Diagnosis not present

## 2020-05-02 DIAGNOSIS — M1611 Unilateral primary osteoarthritis, right hip: Secondary | ICD-10-CM | POA: Diagnosis not present

## 2020-05-02 DIAGNOSIS — M76891 Other specified enthesopathies of right lower limb, excluding foot: Secondary | ICD-10-CM | POA: Diagnosis not present

## 2020-05-02 DIAGNOSIS — M25551 Pain in right hip: Secondary | ICD-10-CM | POA: Diagnosis not present

## 2020-05-02 DIAGNOSIS — W010XXD Fall on same level from slipping, tripping and stumbling without subsequent striking against object, subsequent encounter: Secondary | ICD-10-CM | POA: Diagnosis not present

## 2020-05-07 ENCOUNTER — Other Ambulatory Visit: Payer: Self-pay | Admitting: Sports Medicine

## 2020-05-07 DIAGNOSIS — M25559 Pain in unspecified hip: Secondary | ICD-10-CM

## 2020-05-14 ENCOUNTER — Ambulatory Visit
Admission: RE | Admit: 2020-05-14 | Discharge: 2020-05-14 | Disposition: A | Discharge status: Home / Self Care | Payer: BC Managed Care – PPO | Source: Ambulatory Visit | Attending: Sports Medicine | Admitting: Sports Medicine

## 2020-05-14 ENCOUNTER — Other Ambulatory Visit: Payer: Self-pay

## 2020-05-14 DIAGNOSIS — M25559 Pain in unspecified hip: Secondary | ICD-10-CM | POA: Diagnosis not present

## 2020-05-14 DIAGNOSIS — R600 Localized edema: Secondary | ICD-10-CM | POA: Diagnosis not present

## 2020-05-14 DIAGNOSIS — Z90710 Acquired absence of both cervix and uterus: Secondary | ICD-10-CM | POA: Diagnosis not present

## 2020-05-14 DIAGNOSIS — M1611 Unilateral primary osteoarthritis, right hip: Secondary | ICD-10-CM | POA: Diagnosis not present

## 2020-05-29 DIAGNOSIS — M1611 Unilateral primary osteoarthritis, right hip: Secondary | ICD-10-CM | POA: Diagnosis not present

## 2020-05-29 DIAGNOSIS — W010XXD Fall on same level from slipping, tripping and stumbling without subsequent striking against object, subsequent encounter: Secondary | ICD-10-CM | POA: Diagnosis not present

## 2020-05-29 DIAGNOSIS — M25551 Pain in right hip: Secondary | ICD-10-CM | POA: Diagnosis not present

## 2020-05-29 DIAGNOSIS — M76891 Other specified enthesopathies of right lower limb, excluding foot: Secondary | ICD-10-CM | POA: Diagnosis not present

## 2020-05-29 DIAGNOSIS — M7061 Trochanteric bursitis, right hip: Secondary | ICD-10-CM | POA: Diagnosis not present

## 2020-05-29 DIAGNOSIS — N1831 Chronic kidney disease, stage 3a: Secondary | ICD-10-CM | POA: Diagnosis not present

## 2020-07-12 DIAGNOSIS — M7061 Trochanteric bursitis, right hip: Secondary | ICD-10-CM | POA: Diagnosis not present

## 2020-07-12 DIAGNOSIS — M76891 Other specified enthesopathies of right lower limb, excluding foot: Secondary | ICD-10-CM | POA: Diagnosis not present

## 2020-07-12 DIAGNOSIS — M1611 Unilateral primary osteoarthritis, right hip: Secondary | ICD-10-CM | POA: Diagnosis not present

## 2020-07-12 DIAGNOSIS — M25551 Pain in right hip: Secondary | ICD-10-CM | POA: Diagnosis not present

## 2020-07-12 DIAGNOSIS — W010XXD Fall on same level from slipping, tripping and stumbling without subsequent striking against object, subsequent encounter: Secondary | ICD-10-CM | POA: Diagnosis not present

## 2020-08-15 DIAGNOSIS — M1610 Unilateral primary osteoarthritis, unspecified hip: Secondary | ICD-10-CM | POA: Diagnosis not present

## 2020-08-22 ENCOUNTER — Other Ambulatory Visit: Payer: Self-pay | Admitting: *Deleted

## 2020-08-22 ENCOUNTER — Other Ambulatory Visit: Payer: Self-pay | Admitting: Oncology

## 2020-08-22 DIAGNOSIS — D0511 Intraductal carcinoma in situ of right breast: Secondary | ICD-10-CM

## 2020-08-22 DIAGNOSIS — Z5181 Encounter for therapeutic drug level monitoring: Secondary | ICD-10-CM

## 2020-08-22 NOTE — Telephone Encounter (Signed)
Patient has not been seen since 04/2019, Her follow up appointment never got made Follow-up and Disposition History  04/22/2019 1139 - Sindy Guadeloupe, MD  Check-out note:  Labs CBC with differential, CMP and see Dr. Janese Banks in 6 months. Needs a bone density scan prior  --acr   She does not have any follow up appts with Dr Janese Banks either

## 2020-08-31 ENCOUNTER — Other Ambulatory Visit: Payer: Self-pay

## 2020-08-31 ENCOUNTER — Inpatient Hospital Stay (HOSPITAL_BASED_OUTPATIENT_CLINIC_OR_DEPARTMENT_OTHER): Payer: BC Managed Care – PPO | Admitting: Oncology

## 2020-08-31 ENCOUNTER — Inpatient Hospital Stay: Payer: BC Managed Care – PPO | Attending: Oncology

## 2020-08-31 ENCOUNTER — Encounter: Payer: Self-pay | Admitting: Oncology

## 2020-08-31 VITALS — BP 135/80 | HR 83 | Temp 97.0°F | Resp 18 | Wt 181.2 lb

## 2020-08-31 DIAGNOSIS — D0511 Intraductal carcinoma in situ of right breast: Secondary | ICD-10-CM | POA: Diagnosis not present

## 2020-08-31 DIAGNOSIS — Z801 Family history of malignant neoplasm of trachea, bronchus and lung: Secondary | ICD-10-CM | POA: Insufficient documentation

## 2020-08-31 DIAGNOSIS — Z79811 Long term (current) use of aromatase inhibitors: Secondary | ICD-10-CM

## 2020-08-31 DIAGNOSIS — Z5181 Encounter for therapeutic drug level monitoring: Secondary | ICD-10-CM | POA: Diagnosis not present

## 2020-08-31 DIAGNOSIS — Z803 Family history of malignant neoplasm of breast: Secondary | ICD-10-CM | POA: Diagnosis not present

## 2020-08-31 DIAGNOSIS — Z800 Family history of malignant neoplasm of digestive organs: Secondary | ICD-10-CM | POA: Insufficient documentation

## 2020-08-31 LAB — COMPREHENSIVE METABOLIC PANEL
ALT: 25 U/L (ref 0–44)
AST: 24 U/L (ref 15–41)
Albumin: 4.1 g/dL (ref 3.5–5.0)
Alkaline Phosphatase: 89 U/L (ref 38–126)
Anion gap: 10 (ref 5–15)
BUN: 26 mg/dL — ABNORMAL HIGH (ref 8–23)
CO2: 25 mmol/L (ref 22–32)
Calcium: 9.7 mg/dL (ref 8.9–10.3)
Chloride: 102 mmol/L (ref 98–111)
Creatinine, Ser: 1.41 mg/dL — ABNORMAL HIGH (ref 0.44–1.00)
GFR, Estimated: 40 mL/min — ABNORMAL LOW (ref >60)
Glucose, Bld: 96 mg/dL (ref 70–99)
Potassium: 4.2 mmol/L (ref 3.5–5.1)
Sodium: 137 mmol/L (ref 135–145)
Total Bilirubin: 0.5 mg/dL (ref 0.3–1.2)
Total Protein: 7.6 g/dL (ref 6.5–8.1)

## 2020-08-31 LAB — CBC WITH DIFFERENTIAL/PLATELET
Abs Immature Granulocytes: 0.01 10*3/uL (ref 0.00–0.07)
Basophils Absolute: 0 10*3/uL (ref 0.0–0.1)
Basophils Relative: 1 %
Eosinophils Absolute: 0.1 10*3/uL (ref 0.0–0.5)
Eosinophils Relative: 2 %
HCT: 34.7 % — ABNORMAL LOW (ref 36.0–46.0)
Hemoglobin: 11.9 g/dL — ABNORMAL LOW (ref 12.0–15.0)
Immature Granulocytes: 0 %
Lymphocytes Relative: 33 %
Lymphs Abs: 1.8 10*3/uL (ref 0.7–4.0)
MCH: 32.1 pg (ref 26.0–34.0)
MCHC: 34.3 g/dL (ref 30.0–36.0)
MCV: 93.5 fL (ref 80.0–100.0)
Monocytes Absolute: 0.6 10*3/uL (ref 0.1–1.0)
Monocytes Relative: 11 %
Neutro Abs: 3 10*3/uL (ref 1.7–7.7)
Neutrophils Relative %: 53 %
Platelets: 244 10*3/uL (ref 150–400)
RBC: 3.71 MIL/uL — ABNORMAL LOW (ref 3.87–5.11)
RDW: 13.1 % (ref 11.5–15.5)
WBC: 5.5 10*3/uL (ref 4.0–10.5)
nRBC: 0 % (ref 0.0–0.2)

## 2020-08-31 MED ORDER — ANASTROZOLE 1 MG PO TABS: 1.0000 mg | ORAL_TABLET | Freq: Every day | ORAL | 3 refills | Status: DC

## 2020-09-02 NOTE — Progress Notes (Signed)
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 ER greater than 90% positive and PR was 11-50% positive. Biopsy of the right lower quadrant of the right breast showed fibroadenomatous changes with calcifications.Patient underwent lumpectomy on 01/30/2017 which showed a 15 mm DCIS, grade 2 with comedonecrosis. Margins were -3 mm from the closest medial margin lymph nodes were not submitted. Pathology stagepTisNx.ER greater than 90% positive PR 11-50% positive. Patient completed adjuvant RT and started arimidex in jan 2019   Interval history-patient currently reports doing well and denies any specific complaints at this time.  She is tolerating Arimidex well without any significant side effects.  Denies any new aches and pains anywhere.  Appetite and weight have remained stable  ECOG PS- 1 Pain scale- 0 Opioid associated constipation- no  Review of systems- Review of Systems  Constitutional: Negative for chills, fever, malaise/fatigue and weight loss.  HENT: Negative for congestion, ear discharge and nosebleeds.   Eyes: Negative for blurred vision.  Respiratory: Negative for cough, hemoptysis, sputum production, shortness of breath and wheezing.   Cardiovascular: Negative for chest pain, palpitations, orthopnea and claudication.  Gastrointestinal: Negative for abdominal pain, blood in  stool, constipation, diarrhea, heartburn, melena, nausea and vomiting.  Genitourinary: Negative for dysuria, flank pain, frequency, hematuria and urgency.  Musculoskeletal: Negative for back pain, joint pain and myalgias.  Skin: Negative for rash.  Neurological: Negative for dizziness, tingling, focal weakness, seizures, weakness and headaches.  Endo/Heme/Allergies: Does not bruise/bleed easily.  Psychiatric/Behavioral: Negative for depression and suicidal ideas. The patient does not have insomnia.       Allergies  Allergen Reactions  . Penicillins Anaphylaxis  . Shellfish Allergy Swelling    angioedema     Past Medical History:  Diagnosis Date  . Abnormal mammogram of right breast 01/17/2016   Recommendation: Six month follow-up diagnostic mammogram of the right breast for an additional group of probably benign Calcifications. Sept 2017 --> due March 2018  . Arthralgia 10/04/2016  . Arthritis    hands  . Benign essential HTN 12/07/2006  . Breast cancer ([place]) 2018   right breast DCIS with rad tx  . Breast cancer screening 11/15/2015  . Chronic kidney disease, stage II (mild) 11/15/2015  . Chronic left shoulder pain 11/08/2014  . 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 Family history of diabetes mellitus 06/12/2016   Mother and several other relatives  . HLD (hyperlipidemia) 06/15/2015  . Hx of right breast biopsy 01/17/2016  . Hyperlipidemia   . Hypertension   . Medication monitoring encounter 11/15/2015  . Motion sickness    ocean ships  . Numbness and tingling in left hand 11/07/2014  . Obesity 10/25/2015  . Obesity (BMI 30.0-34.9) 10/25/2015  . Personal history of radiation therapy 2018   right breast ca  . Positive ANA (antinuclear antibody) 10/04/2016  . Prediabetes   . Preventative health care 11/15/2015  . Vitamin D deficiency 10/29/2015  Past Surgical History:  Procedure Laterality Date  . ABDOMINAL HYSTERECTOMY    . BREAST BIOPSY  Right 01/15/2016    cylinder shape calcs UOQ, FIBROADENOMATOUS  . BREAST BIOPSY Right 12/30/2016   stereo bx for calcifications: DCIS UOQ, ribbon shaped  . BREAST BIOPSY Right 01/08/2017   LOQ x shaped, FIBROADENOMATOUS CHANGE WITH  . BREAST BIOPSY Left 03/09/2019   calcs bx, x marker path pending  . BREAST LUMPECTOMY Right 01/30/2018   DCIS clear margins  . BREAST LUMPECTOMY WITH NEEDLE LOCALIZATION Right 01/30/2017   Procedure: BREAST LUMPECTOMY WITH NEEDLE LOCALIZATION;  Surgeon: Vickie Epley, MD;  Location: [facility];  Service: General;  Laterality: Right;  . COLONOSCOPY WITH PROPOFOL N/A 11/19/2016   Procedure: COLONOSCOPY WITH PROPOFOL;  Surgeon: Jonathon Bellows, MD;  Location: [place];  Service: Endoscopy;  Laterality: N/A;  . ESOPHAGOGASTRODUODENOSCOPY (EGD) WITH PROPOFOL N/A 11/19/2016   Procedure: ESOPHAGOGASTRODUODENOSCOPY (EGD) WITH PROPOFOL;  Surgeon: Jonathon Bellows, MD;  Location: [place];  Service: Endoscopy;  Laterality: N/A;  . GIVENS CAPSULE STUDY N/A 12/12/2016   Procedure: GIVENS CAPSULE STUDY;  Surgeon: Jonathon Bellows, MD;  [facility];  Service: Gastroenterology;  Laterality: N/A;  . TOTAL MASTECTOMY Right 01/30/2017   Error    Social History   Socioeconomic History  . Marital status: Married    Spouse name: Not on file  . Number of children: 2  . Years of education: Not on file  . Highest education level: Not on file  Occupational History  . Occupation: Works in [employer] [place], [place])    Employer: [employer]  Tobacco Use  . Smoking status: Never Smoker  . Smokeless tobacco: Never Used  Vaping Use  . Vaping Use: Never used  Substance and Sexual Activity  . Alcohol use: No    Alcohol/week: 0.0 standard drinks  . Drug use: No  . Sexual activity: Not Currently  Other Topics Concern  . Not on file  Social History Narrative  . Not on file   Social Determinants of Health   Financial Resource Strain:  Low Risk   . Difficulty of Paying Living Expenses: Not hard at all  Food Insecurity: No Food Insecurity  . Worried About Charity fundraiser in the Last Year: Never true  . Ran Out of Food in the Last Year: Never true  Transportation Needs: No Transportation Needs  . Lack of Transportation (Medical): No  . Lack of Transportation (Non-Medical): No  Physical Activity: Insufficiently Active  . Days of Exercise per Week: 2 days  . Minutes of Exercise per Session: 30 min  Stress: No Stress Concern Present  . Feeling of Stress : Only a little  Social Connections: Moderately Integrated  . Frequency of Communication with Friends and Family: More than three times a week  . Frequency of Social Gatherings with Friends and Family: More than three times a week  . Attends Religious Services: More than 4 times per year  . Active Member of Clubs or Organizations: No  . Attends Archivist Meetings: Never  . Marital Status: Married  Human resources officer Violence: Not At Risk  . Fear of Current or Ex-Partner: No  . Emotionally Abused: No  . Physically Abused: No  . Sexually Abused: No    Family History  Problem Relation Age of Onset  . Cancer Mother 75       liver cancer  . Diabetes Mother   . Hypertension Mother   . Glomerulonephritis Father   .  Diabetes Sister   . Glomerulonephritis Sister   . Cancer Sister 43       throat cancer  . Heart disease Sister   . Diabetes Sister   . Diabetes Sister   . COPD Sister   . Diabetes Sister   . Breast cancer Cousin        Currently undergoing systemic chemotherapy (01/2017)  . Varicose Veins Neg Hx      Current Outpatient Medications:  .  acetaminophen (TYLENOL) 325 MG tablet, Take 650 mg by mouth every 6 (six) hours as needed for mild pain or headache. , Disp: , Rfl:  .  albuterol (VENTOLIN HFA) 108 (90 Base) MCG/ACT inhaler, Inhale 2-4 puffs by mouth every 4 hours as needed for wheezing, cough, and/or shortness of breath, Disp: 8 g, Rfl:  1 .  Ascorbic Acid (VITAMIN C) 1000 MG tablet, Take 1,000 mg by mouth daily., Disp: , Rfl:  .  calcium carbonate (OS-CAL) 600 MG TABS tablet, Take 600 mg by mouth 2 (two) times daily with a meal., Disp: , Rfl:  .  Cholecalciferol (VITAMIN D3) 50 MCG (2000 UT) TABS, Take 1 tablet by mouth daily., Disp: , Rfl:  .  Cyanocobalamin (VITAMIN B-12) 500 MCG SUBL, Place under the tongue daily. , Disp: , Rfl:  .  metoprolol succinate (TOPROL-XL) 25 MG 24 hr tablet, Take 1 tablet (25 mg total) by mouth daily., Disp: 90 tablet, Rfl: 3 .  triamterene-hydrochlorothiazide (MAXZIDE-25) 37.5-25 MG tablet, Take 1 tablet by mouth daily., Disp: 90 tablet, Rfl: 3 .  anastrozole (ARIMIDEX) 1 MG tablet, Take 1 tablet (1 mg total) by mouth daily., Disp: 90 tablet, Rfl: 3 .  atorvastatin (LIPITOR) 20 MG tablet, Take 1 tablet (20 mg total) by mouth at bedtime. (Patient not taking: Reported on 08/31/2020), Disp: 90 tablet, Rfl: 3  Physical exam:  Vitals:   08/31/20 1405  BP: 135/80  Pulse: 83  Resp: 18  Temp: (!) 97 F (36.1 C)  SpO2: 100%  Weight: 181 lb 3.2 oz (82.2 kg)   Physical Exam Constitutional:      General: She is not in acute distress. Cardiovascular:     Rate and Rhythm: Normal rate and regular rhythm.     Heart sounds: Normal heart sounds.  Pulmonary:     Effort: Pulmonary effort is normal.     Breath sounds: Normal breath sounds.  Abdominal:     General: Bowel sounds are normal.     Palpations: Abdomen is soft.  Skin:    General: Skin is warm and dry.  Neurological:     Mental Status: She is alert and oriented to person, place, and time.    Breast exam was performed in seated and lying down position. Patient is status post right lumpectomy with a well-healed surgical scar. No evidence of any palpable masses. No evidence of axillary adenopathy. No evidence of any palpable masses or lumps in the left breast. No evidence of leftt axillary adenopathy   CMP Latest Ref Rng & Units 08/31/2020   Glucose 70 - 99 mg/dL 96  BUN 8 - 23 mg/dL 26(H)  Creatinine 0.44 - 1.00 mg/dL 1.41(H)  Sodium 135 - 145 mmol/L 137  Potassium 3.5 - 5.1 mmol/L 4.2  Chloride 98 - 111 mmol/L 102  CO2 22 - 32 mmol/L 25  Calcium 8.9 - 10.3 mg/dL 9.7  Total Protein 6.5 - 8.1 g/dL 7.6  Total Bilirubin 0.3 - 1.2 mg/dL 0.5  Alkaline Phos 38 - 126 U/L 89  AST 15 - 41 U/L 24  ALT 0 - 44 U/L 25   CBC Latest Ref Rng & Units 08/31/2020  WBC 4.0 - 10.5 K/uL 5.5  Hemoglobin 12.0 - 15.0 g/dL 11.9(L)  Hematocrit 36.0 - 46.0 % 34.7(L)  Platelets 150 - 400 K/uL 244    Assessment and plan- Patient is a 70 y.o. female with right breast DCIS ER positive on Arimidex here for routine follow-up   Clinically patient is doing well with no concerning signs and symptoms of recurrence based on today's exam.  She is tolerating Arimidex well without any significant side effects.  She would be due for repeat bone density scan this year which we will schedule.  I will see her back in 6 months no labs    Visit Diagnosis 1. Ductal carcinoma in situ (DCIS) of right breast   2. Visit for monitoring Arimidex therapy      Dr. Randa Evens, MD, MPH [facility] at [facility] [id number] 09/02/2020 2:55 PM

## 2020-09-21 ENCOUNTER — Other Ambulatory Visit: Payer: Self-pay | Admitting: Orthopedic Surgery

## 2020-09-28 ENCOUNTER — Other Ambulatory Visit: Payer: Self-pay

## 2020-09-28 ENCOUNTER — Encounter
Admission: RE | Admit: 2020-09-28 | Discharge: 2020-09-28 | Disposition: A | Discharge status: Home / Self Care | Payer: BC Managed Care – PPO | Source: Ambulatory Visit | Attending: Orthopedic Surgery | Admitting: Orthopedic Surgery

## 2020-09-28 DIAGNOSIS — Z01818 Encounter for other preprocedural examination: Secondary | ICD-10-CM | POA: Insufficient documentation

## 2020-09-28 HISTORY — DX: Nausea with vomiting, unspecified: R11.2

## 2020-09-28 HISTORY — DX: Other specified postprocedural states: Z98.890

## 2020-09-28 LAB — URINALYSIS, ROUTINE W REFLEX MICROSCOPIC
Bilirubin Urine: NEGATIVE
Glucose, UA: NEGATIVE mg/dL
Hgb urine dipstick: NEGATIVE
Ketones, ur: NEGATIVE mg/dL
Leukocytes,Ua: NEGATIVE
Nitrite: NEGATIVE
Protein, ur: NEGATIVE mg/dL
Specific Gravity, Urine: 1.004 — ABNORMAL LOW (ref 1.005–1.030)
pH: 7 (ref 5.0–8.0)

## 2020-09-28 LAB — COMPREHENSIVE METABOLIC PANEL
ALT: 14 U/L (ref 0–44)
AST: 18 U/L (ref 15–41)
Albumin: 4 g/dL (ref 3.5–5.0)
Alkaline Phosphatase: 85 U/L (ref 38–126)
Anion gap: 10 (ref 5–15)
BUN: 27 mg/dL — ABNORMAL HIGH (ref 8–23)
CO2: 25 mmol/L (ref 22–32)
Calcium: 9.7 mg/dL (ref 8.9–10.3)
Chloride: 101 mmol/L (ref 98–111)
Creatinine, Ser: 1.48 mg/dL — ABNORMAL HIGH (ref 0.44–1.00)
GFR, Estimated: 38 mL/min — ABNORMAL LOW (ref >60)
Glucose, Bld: 92 mg/dL (ref 70–99)
Potassium: 3.5 mmol/L (ref 3.5–5.1)
Sodium: 136 mmol/L (ref 135–145)
Total Bilirubin: 0.8 mg/dL (ref 0.3–1.2)
Total Protein: 7.8 g/dL (ref 6.5–8.1)

## 2020-09-28 LAB — SURGICAL PCR SCREEN
MRSA, PCR: NEGATIVE
Staphylococcus aureus: NEGATIVE

## 2020-09-28 LAB — CBC WITH DIFFERENTIAL/PLATELET
Abs Immature Granulocytes: 0.01 10*3/uL (ref 0.00–0.07)
Basophils Absolute: 0.1 10*3/uL (ref 0.0–0.1)
Basophils Relative: 1 %
Eosinophils Absolute: 0.1 10*3/uL (ref 0.0–0.5)
Eosinophils Relative: 2 %
HCT: 34.3 % — ABNORMAL LOW (ref 36.0–46.0)
Hemoglobin: 11.6 g/dL — ABNORMAL LOW (ref 12.0–15.0)
Immature Granulocytes: 0 %
Lymphocytes Relative: 36 %
Lymphs Abs: 1.9 10*3/uL (ref 0.7–4.0)
MCH: 31.2 pg (ref 26.0–34.0)
MCHC: 33.8 g/dL (ref 30.0–36.0)
MCV: 92.2 fL (ref 80.0–100.0)
Monocytes Absolute: 0.5 10*3/uL (ref 0.1–1.0)
Monocytes Relative: 9 %
Neutro Abs: 2.7 10*3/uL (ref 1.7–7.7)
Neutrophils Relative %: 52 %
Platelets: 260 10*3/uL (ref 150–400)
RBC: 3.72 MIL/uL — ABNORMAL LOW (ref 3.87–5.11)
RDW: 13.2 % (ref 11.5–15.5)
WBC: 5.2 10*3/uL (ref 4.0–10.5)
nRBC: 0 % (ref 0.0–0.2)

## 2020-09-28 LAB — TYPE AND SCREEN
ABO/RH(D): A POS
Antibody Screen: NEGATIVE

## 2020-09-28 NOTE — Patient Instructions (Addendum)
Your procedure is scheduled on: 10/11/2020 Report to the Registration Desk on the 1st floor of the [facility]. To find out your arrival time, please call [phone] between 1PM - 3PM on: 10/10/2020  REMEMBER: Instructions that are not followed completely may result in serious medical risk, up to and including death; or upon the discretion of your surgeon and anesthesiologist your surgery may need to be rescheduled.  Do not eat food after midnight the night before surgery.  No gum chewing, lozengers or hard candies.  You may however, drink CLEAR liquids up to 2 hours before you are scheduled to arrive for your surgery. Do not drink anything within 2 hours of your scheduled arrival time.  Clear liquids include: - water  - apple juice without pulp - gatorade - black coffee or tea (Do NOT add milk or creamers to the coffee or tea) Do NOT drink anything that is not on this list.  In addition, your doctor has ordered for you to drink the provided  Ensure Pre-Surgery Clear Carbohydrate Drink  Drinking this carbohydrate drink up to two hours before surgery helps to reduce insulin resistance and improve patient outcomes. Please complete drinking 2 hours prior to scheduled arrival time.  TAKE THESE MEDICATIONS THE MORNING OF SURGERY WITH A SIP OF WATER: - Anastrozole   Use inhalers on the day of surgery and bring to the hospital.  One week prior to surgery: Stop Anti-inflammatories (NSAIDS) such as Advil, Aleve, Ibuprofen, Motrin, Naproxen, Naprosyn and Aspirin based products such as Excedrin, Goodys Powder, BC Powder. You may however, continue to take Tylenol if needed for pain up until the day of surgery. Stop ANY OVER THE COUNTER vitamins and supplements until after surgery. Last dose 10/03/2020 No Alcohol for 24 hours before or after surgery.  No Smoking including e-cigarettes for 24 hours prior to surgery.  No chewable tobacco products for at least 6 hours prior to surgery.  No  nicotine patches on the day of surgery.  Do not use any "recreational" drugs for at least a week prior to your surgery.  Please be advised that the combination of cocaine and anesthesia may have negative outcomes, up to and including death. If you test positive for cocaine, your surgery will be cancelled.  On the morning of surgery brush your teeth with toothpaste and water, you may rinse your mouth with mouthwash if you wish. Do not swallow any toothpaste or mouthwash.  Do not wear jewelry, make-up, hairpins, clips or nail polish.  Do not wear lotions, powders, or perfumes.   Do not shave body from the neck down 48 hours prior to surgery just in case you cut yourself which could leave a site for infection.  Also, freshly shaved skin may become irritated if using the CHG soap.  Contact lenses, hearing aids and dentures may not be worn into surgery.  Do not bring valuables to the hospital. [facility] is not responsible for any missing/lost belongings or valuables.   Use CHG Soap or wipes as directed on instruction sheet.  Notify your doctor if there is any change in your medical condition (cold, fever, infection).  Wear comfortable clothing (specific to your surgery type) to the hospital.  Plan for stool softeners for home use; pain medications have a tendency to cause constipation. You can also help prevent constipation by eating foods high in fiber such as fruits and vegetables and drinking plenty of fluids as your diet allows.  After surgery, you can help prevent lung  complications by doing breathing exercises.  Take deep breaths and cough every 1-2 hours. Your doctor may order a device called an Incentive Spirometer to help you take deep breaths. When coughing or sneezing, hold a pillow firmly against your incision with both hands. This is called "splinting." Doing this helps protect your incision. It also decreases belly discomfort.  If you are being admitted to the hospital  overnight, leave your suitcase in the car. After surgery it may be brought to your room.  Please call the [facility] at [phone] if you have any questions about these instructions.  Surgery Visitation Policy:  Patients undergoing a surgery or procedure may have one family member or support person with them as long as that person is not COVID-19 positive or experiencing its symptoms.  That person may remain in the waiting area during the procedure.  Inpatient Visitation:    Visiting hours are 7 a.m. to 8 p.m. Inpatients will be allowed two visitors daily. The visitors may change each day during the patient's stay. No visitors under the age of 25. Any visitor under the age of 30 must be accompanied by an adult. The visitor must pass COVID-19 screenings, use hand sanitizer when entering and exiting the patient's room and wear a mask at all times, including in the patient's room. Patients must also wear a mask when staff or their visitor are in the room. Masking is required regardless of vaccination status.  Total Hip/Knee Replacement Preoperative Educational Video  To better prepare for surgery, please view our videos that explain the physical activity and discharge planning required to have the best surgical recovery at [facility].  [email or web address]      Questions? Call [phone] or email [email or web address]

## 2020-10-08 ENCOUNTER — Ambulatory Visit (INDEPENDENT_AMBULATORY_CARE_PROVIDER_SITE_OTHER): Payer: BC Managed Care – PPO | Admitting: Unknown Physician Specialty

## 2020-10-08 ENCOUNTER — Other Ambulatory Visit: Payer: Self-pay

## 2020-10-08 ENCOUNTER — Encounter: Payer: Self-pay | Admitting: Unknown Physician Specialty

## 2020-10-08 DIAGNOSIS — H524 Presbyopia: Secondary | ICD-10-CM | POA: Diagnosis not present

## 2020-10-08 DIAGNOSIS — E782 Mixed hyperlipidemia: Secondary | ICD-10-CM | POA: Diagnosis not present

## 2020-10-08 DIAGNOSIS — E785 Hyperlipidemia, unspecified: Secondary | ICD-10-CM

## 2020-10-08 DIAGNOSIS — I10 Essential (primary) hypertension: Secondary | ICD-10-CM | POA: Diagnosis not present

## 2020-10-08 MED ORDER — TRIAMTERENE-HCTZ 37.5-25 MG PO TABS: 1.0000 | ORAL_TABLET | Freq: Every day | ORAL | 3 refills | Status: DC

## 2020-10-08 MED ORDER — ATORVASTATIN CALCIUM 20 MG PO TABS: 20.0000 mg | ORAL_TABLET | Freq: Every day | ORAL | 3 refills | Status: DC

## 2020-10-08 MED ORDER — METOPROLOL SUCCINATE ER 25 MG PO TB24
25.0000 mg | ORAL_TABLET | Freq: Every day | ORAL | 3 refills | Status: DC
Start: 2020-10-08 — End: 2021-10-11

## 2020-10-08 NOTE — Assessment & Plan Note (Signed)
Stable, continue present medications. Recent chemistries done prior to planned hip surgery on Thursday.  Reviewed and are stable

## 2020-10-08 NOTE — Assessment & Plan Note (Signed)
Lipids 6 months ago.  Not quite to goal but pt not wanting Lipids checked today.  On statin

## 2020-10-08 NOTE — Progress Notes (Signed)
BP 128/82   Pulse 75   Temp 98.1 F (36.7 C)   Resp 14   Ht 5\' 3"  (1.6 m)   Wt 177 lb 4.8 oz (80.4 kg)   SpO2 95%   BMI 31.41 kg/m    Subjective:    Patient ID: Marissa Nelson, female    DOB: 1950-10-27, 70 y.o.   MRN: [id number]  HPI: Marissa Nelson is a 70 y.o. female  Chief Complaint  Patient presents with  . Follow-up  . Hypertension  . Hyperlipidemia   Hypertension Using medications without difficulty Average home BPs 122/79 this morning which is typical   No problems or lightheadedness No chest pain with exertion or shortness of breath No Edema   Hyperlipidemia Using medications without problems No Muscle aches  Diet compliance: Not eating a lot.   Exercise: No exercising  Awaiting surgery for a hip replacement on Thursday   Relevant past medical, surgical, family and social history reviewed and updated as indicated. Interim medical history since our last visit reviewed. Allergies and medications reviewed and updated.  Review of Systems  Per HPI unless specifically indicated above     Objective:    BP 128/82   Pulse 75   Temp 98.1 F (36.7 C)   Resp 14   Ht 5\' 3"  (1.6 m)   Wt 177 lb 4.8 oz (80.4 kg)   SpO2 95%   BMI 31.41 kg/m   Wt Readings from Last 3 Encounters:  10/08/20 177 lb 4.8 oz (80.4 kg)  09/28/20 178 lb 12.7 oz (81.1 kg)  08/31/20 181 lb 3.2 oz (82.2 kg)    Physical Exam Constitutional:      General: She is not in acute distress.    Appearance: Normal appearance. She is well-developed.  HENT:     Head: Normocephalic and atraumatic.  Eyes:     General: Lids are normal. No scleral icterus.       Right eye: No discharge.        Left eye: No discharge.     Conjunctiva/sclera: Conjunctivae normal.  Neck:     Vascular: No carotid bruit or JVD.  Cardiovascular:     Rate and Rhythm: Normal rate and regular rhythm.     Heart sounds: Normal heart sounds.  Pulmonary:     Effort: Pulmonary effort is normal.      Breath sounds: Normal breath sounds.  Abdominal:     Palpations: There is no hepatomegaly or splenomegaly.  Musculoskeletal:        General: Normal range of motion.     Cervical back: Normal range of motion and neck supple.  Skin:    General: Skin is warm and dry.     Coloration: Skin is not pale.     Findings: No rash.  Neurological:     Mental Status: She is alert and oriented to person, place, and time.  Psychiatric:        Behavior: Behavior normal.        Thought Content: Thought content normal.        Judgment: Judgment normal.       Assessment & Plan:   Problem List Items Addressed This Visit      Unprioritized   Benign essential HTN    Stable, continue present medications. Recent chemistries done prior to planned hip surgery on Thursday.  Reviewed and are stable       Relevant Medications   metoprolol succinate (TOPROL-XL) 25 MG 24 hr tablet  atorvastatin (LIPITOR) 20 MG tablet   triamterene-hydrochlorothiazide (MAXZIDE-25) 37.5-25 MG tablet   HLD (hyperlipidemia)    Lipids 6 months ago.  Not quite to goal but pt not wanting Lipids checked today.  On statin      Relevant Medications   metoprolol succinate (TOPROL-XL) 25 MG 24 hr tablet   atorvastatin (LIPITOR) 20 MG tablet   triamterene-hydrochlorothiazide (MAXZIDE-25) 37.5-25 MG tablet       Follow up plan: Return in about 6 months (around 04/09/2021).

## 2020-10-09 ENCOUNTER — Other Ambulatory Visit
Admission: RE | Admit: 2020-10-09 | Discharge: 2020-10-09 | Disposition: A | Discharge status: Home / Self Care | Payer: BC Managed Care – PPO | Source: Ambulatory Visit | Attending: Orthopedic Surgery | Admitting: Orthopedic Surgery

## 2020-10-09 ENCOUNTER — Ambulatory Visit: Payer: BC Managed Care – PPO | Admitting: Family Medicine

## 2020-10-09 DIAGNOSIS — Z471 Aftercare following joint replacement surgery: Secondary | ICD-10-CM | POA: Diagnosis not present

## 2020-10-09 DIAGNOSIS — R7303 Prediabetes: Secondary | ICD-10-CM | POA: Diagnosis present

## 2020-10-09 DIAGNOSIS — G8929 Other chronic pain: Secondary | ICD-10-CM | POA: Diagnosis present

## 2020-10-09 DIAGNOSIS — M1611 Unilateral primary osteoarthritis, right hip: Secondary | ICD-10-CM | POA: Diagnosis present

## 2020-10-09 DIAGNOSIS — Z90710 Acquired absence of both cervix and uterus: Secondary | ICD-10-CM | POA: Diagnosis not present

## 2020-10-09 DIAGNOSIS — Z880 Allergy status to penicillin: Secondary | ICD-10-CM | POA: Diagnosis not present

## 2020-10-09 DIAGNOSIS — E790 Hyperuricemia without signs of inflammatory arthritis and tophaceous disease: Secondary | ICD-10-CM | POA: Diagnosis present

## 2020-10-09 DIAGNOSIS — Z853 Personal history of malignant neoplasm of breast: Secondary | ICD-10-CM | POA: Diagnosis not present

## 2020-10-09 DIAGNOSIS — N183 Chronic kidney disease, stage 3 unspecified: Secondary | ICD-10-CM | POA: Diagnosis present

## 2020-10-09 DIAGNOSIS — Z923 Personal history of irradiation: Secondary | ICD-10-CM | POA: Diagnosis not present

## 2020-10-09 DIAGNOSIS — Z01812 Encounter for preprocedural laboratory examination: Secondary | ICD-10-CM | POA: Insufficient documentation

## 2020-10-09 DIAGNOSIS — E785 Hyperlipidemia, unspecified: Secondary | ICD-10-CM | POA: Diagnosis present

## 2020-10-09 DIAGNOSIS — Z841 Family history of disorders of kidney and ureter: Secondary | ICD-10-CM | POA: Diagnosis not present

## 2020-10-09 DIAGNOSIS — E669 Obesity, unspecified: Secondary | ICD-10-CM | POA: Diagnosis present

## 2020-10-09 DIAGNOSIS — Z91013 Allergy to seafood: Secondary | ICD-10-CM | POA: Diagnosis not present

## 2020-10-09 DIAGNOSIS — I129 Hypertensive chronic kidney disease with stage 1 through stage 4 chronic kidney disease, or unspecified chronic kidney disease: Secondary | ICD-10-CM | POA: Diagnosis present

## 2020-10-09 DIAGNOSIS — Z79899 Other long term (current) drug therapy: Secondary | ICD-10-CM | POA: Diagnosis not present

## 2020-10-09 DIAGNOSIS — Z20822 Contact with and (suspected) exposure to covid-19: Secondary | ICD-10-CM | POA: Diagnosis present

## 2020-10-09 DIAGNOSIS — Z6832 Body mass index (BMI) 32.0-32.9, adult: Secondary | ICD-10-CM | POA: Diagnosis not present

## 2020-10-09 DIAGNOSIS — Z96641 Presence of right artificial hip joint: Secondary | ICD-10-CM | POA: Diagnosis not present

## 2020-10-09 DIAGNOSIS — Z833 Family history of diabetes mellitus: Secondary | ICD-10-CM | POA: Diagnosis not present

## 2020-10-09 DIAGNOSIS — D631 Anemia in chronic kidney disease: Secondary | ICD-10-CM | POA: Diagnosis present

## 2020-10-09 LAB — SARS CORONAVIRUS 2 (TAT 6-24 HRS): SARS Coronavirus 2: NEGATIVE

## 2020-10-11 ENCOUNTER — Inpatient Hospital Stay: Payer: BC Managed Care – PPO | Admitting: Urgent Care

## 2020-10-11 ENCOUNTER — Other Ambulatory Visit: Payer: Self-pay

## 2020-10-11 ENCOUNTER — Encounter
Admission: RE | Disposition: A | Discharge status: Home Health | Payer: Self-pay | Source: Home / Self Care | Attending: Orthopedic Surgery

## 2020-10-11 ENCOUNTER — Inpatient Hospital Stay: Payer: BC Managed Care – PPO

## 2020-10-11 ENCOUNTER — Encounter: Payer: Self-pay | Admitting: Orthopedic Surgery

## 2020-10-11 ENCOUNTER — Inpatient Hospital Stay: Payer: BC Managed Care – PPO | Admitting: Anesthesiology

## 2020-10-11 ENCOUNTER — Inpatient Hospital Stay
Admission: RE | Admit: 2020-10-11 | Discharge: 2020-10-13 | DRG: 470 | Disposition: A | Discharge status: Home Health | Payer: BC Managed Care – PPO | Attending: Orthopedic Surgery | Admitting: Orthopedic Surgery

## 2020-10-11 DIAGNOSIS — Z6832 Body mass index (BMI) 32.0-32.9, adult: Secondary | ICD-10-CM

## 2020-10-11 DIAGNOSIS — E669 Obesity, unspecified: Secondary | ICD-10-CM | POA: Diagnosis present

## 2020-10-11 DIAGNOSIS — N183 Chronic kidney disease, stage 3 unspecified: Secondary | ICD-10-CM | POA: Diagnosis present

## 2020-10-11 DIAGNOSIS — M1611 Unilateral primary osteoarthritis, right hip: Principal | ICD-10-CM | POA: Diagnosis present

## 2020-10-11 DIAGNOSIS — G8929 Other chronic pain: Secondary | ICD-10-CM | POA: Diagnosis present

## 2020-10-11 DIAGNOSIS — Z880 Allergy status to penicillin: Secondary | ICD-10-CM | POA: Diagnosis not present

## 2020-10-11 DIAGNOSIS — Z90710 Acquired absence of both cervix and uterus: Secondary | ICD-10-CM

## 2020-10-11 DIAGNOSIS — R7303 Prediabetes: Secondary | ICD-10-CM | POA: Diagnosis present

## 2020-10-11 DIAGNOSIS — Z833 Family history of diabetes mellitus: Secondary | ICD-10-CM

## 2020-10-11 DIAGNOSIS — Z471 Aftercare following joint replacement surgery: Secondary | ICD-10-CM | POA: Diagnosis not present

## 2020-10-11 DIAGNOSIS — I129 Hypertensive chronic kidney disease with stage 1 through stage 4 chronic kidney disease, or unspecified chronic kidney disease: Secondary | ICD-10-CM | POA: Diagnosis present

## 2020-10-11 DIAGNOSIS — G8918 Other acute postprocedural pain: Secondary | ICD-10-CM

## 2020-10-11 DIAGNOSIS — Z841 Family history of disorders of kidney and ureter: Secondary | ICD-10-CM | POA: Diagnosis not present

## 2020-10-11 DIAGNOSIS — Z20822 Contact with and (suspected) exposure to covid-19: Secondary | ICD-10-CM | POA: Diagnosis present

## 2020-10-11 DIAGNOSIS — Z853 Personal history of malignant neoplasm of breast: Secondary | ICD-10-CM

## 2020-10-11 DIAGNOSIS — Z96649 Presence of unspecified artificial hip joint: Secondary | ICD-10-CM

## 2020-10-11 DIAGNOSIS — D631 Anemia in chronic kidney disease: Secondary | ICD-10-CM | POA: Diagnosis present

## 2020-10-11 DIAGNOSIS — Z91013 Allergy to seafood: Secondary | ICD-10-CM | POA: Diagnosis not present

## 2020-10-11 DIAGNOSIS — E790 Hyperuricemia without signs of inflammatory arthritis and tophaceous disease: Secondary | ICD-10-CM | POA: Diagnosis present

## 2020-10-11 DIAGNOSIS — Z79899 Other long term (current) drug therapy: Secondary | ICD-10-CM | POA: Diagnosis not present

## 2020-10-11 DIAGNOSIS — Z923 Personal history of irradiation: Secondary | ICD-10-CM

## 2020-10-11 DIAGNOSIS — Z419 Encounter for procedure for purposes other than remedying health state, unspecified: Secondary | ICD-10-CM

## 2020-10-11 DIAGNOSIS — E785 Hyperlipidemia, unspecified: Secondary | ICD-10-CM | POA: Diagnosis present

## 2020-10-11 DIAGNOSIS — Z96641 Presence of right artificial hip joint: Secondary | ICD-10-CM | POA: Diagnosis not present

## 2020-10-11 HISTORY — PX: TOTAL HIP ARTHROPLASTY: SHX124

## 2020-10-11 LAB — CBC
HCT: 32.2 % — ABNORMAL LOW (ref 36.0–46.0)
Hemoglobin: 11 g/dL — ABNORMAL LOW (ref 12.0–15.0)
MCH: 31.6 pg (ref 26.0–34.0)
MCHC: 34.2 g/dL (ref 30.0–36.0)
MCV: 92.5 fL (ref 80.0–100.0)
Platelets: 208 10*3/uL (ref 150–400)
RBC: 3.48 MIL/uL — ABNORMAL LOW (ref 3.87–5.11)
RDW: 13.2 % (ref 11.5–15.5)
WBC: 10.1 10*3/uL (ref 4.0–10.5)
nRBC: 0 % (ref 0.0–0.2)

## 2020-10-11 LAB — ABO/RH: ABO/RH(D): A POS

## 2020-10-11 LAB — CREATININE, SERUM
Creatinine, Ser: 1.19 mg/dL — ABNORMAL HIGH (ref 0.44–1.00)
GFR, Estimated: 49 mL/min — ABNORMAL LOW (ref >60)

## 2020-10-11 SURGERY — ARTHROPLASTY, HIP, TOTAL, ANTERIOR APPROACH
Anesthesia: Spinal | Site: Hip | Laterality: Right

## 2020-10-11 MED ORDER — EPHEDRINE SULFATE 50 MG/ML IJ SOLN
INTRAMUSCULAR | Status: DC | PRN
  Administered 2020-10-11 (×2): 10 mg via INTRAVENOUS

## 2020-10-11 MED ORDER — BUPIVACAINE-EPINEPHRINE 0.25% -1:200000 IJ SOLN
INTRAMUSCULAR | Status: DC | PRN
  Administered 2020-10-11: 30 mL

## 2020-10-11 MED ORDER — SODIUM CHLORIDE 0.9 % IV SOLN
INTRAVENOUS | Status: DC | PRN
  Administered 2020-10-11: 60 mL

## 2020-10-11 MED ORDER — DIPHENHYDRAMINE HCL 12.5 MG/5ML PO ELIX: 12.5000 mg | ORAL_SOLUTION | ORAL | Status: DC | PRN

## 2020-10-11 MED ORDER — CHLORHEXIDINE GLUCONATE 0.12 % MT SOLN
OROMUCOSAL | Status: AC
  Administered 2020-10-11: 15 mL via OROMUCOSAL
  Filled 2020-10-11: qty 15

## 2020-10-11 MED ORDER — CLINDAMYCIN PHOSPHATE 600 MG/50ML IV SOLN
600.0000 mg | Freq: Four times a day (QID) | INTRAVENOUS | Status: AC
  Administered 2020-10-11 (×2): 600 mg via INTRAVENOUS
  Filled 2020-10-11 (×2): qty 50

## 2020-10-11 MED ORDER — ACETAMINOPHEN 10 MG/ML IV SOLN
INTRAVENOUS | Status: AC
  Filled 2020-10-11: qty 100

## 2020-10-11 MED ORDER — ORAL CARE MOUTH RINSE: 15.0000 mL | Freq: Once | OROMUCOSAL | Status: AC

## 2020-10-11 MED ORDER — MAGNESIUM CITRATE PO SOLN
1.0000 | Freq: Once | ORAL | Status: DC | PRN
  Filled 2020-10-11: qty 296

## 2020-10-11 MED ORDER — DEXAMETHASONE SODIUM PHOSPHATE 4 MG/ML IJ SOLN
INTRAMUSCULAR | Status: DC | PRN
  Administered 2020-10-11: 10 mg via INTRAVENOUS

## 2020-10-11 MED ORDER — PROPOFOL 500 MG/50ML IV EMUL
INTRAVENOUS | Status: DC | PRN
  Administered 2020-10-11: 50 ug/kg/min via INTRAVENOUS

## 2020-10-11 MED ORDER — MIDAZOLAM HCL 5 MG/5ML IJ SOLN
INTRAMUSCULAR | Status: DC | PRN
  Administered 2020-10-11 (×2): 1 mg via INTRAVENOUS

## 2020-10-11 MED ORDER — PHENYLEPHRINE HCL (PRESSORS) 10 MG/ML IV SOLN
INTRAVENOUS | Status: DC | PRN
  Administered 2020-10-11 (×2): 100 ug via INTRAVENOUS

## 2020-10-11 MED ORDER — DEXAMETHASONE SODIUM PHOSPHATE 10 MG/ML IJ SOLN
INTRAMUSCULAR | Status: AC
  Filled 2020-10-11: qty 1

## 2020-10-11 MED ORDER — EPHEDRINE 5 MG/ML INJ
INTRAVENOUS | Status: AC
  Filled 2020-10-11: qty 10

## 2020-10-11 MED ORDER — MIDAZOLAM HCL 2 MG/2ML IJ SOLN
INTRAMUSCULAR | Status: AC
  Filled 2020-10-11: qty 2

## 2020-10-11 MED ORDER — FAMOTIDINE 20 MG PO TABS: 20.0000 mg | ORAL_TABLET | Freq: Once | ORAL | Status: AC

## 2020-10-11 MED ORDER — BUPIVACAINE HCL (PF) 0.5 % IJ SOLN
INTRAMUSCULAR | Status: DC | PRN
  Administered 2020-10-11: 2.5 mL via INTRATHECAL

## 2020-10-11 MED ORDER — ONDANSETRON HCL 4 MG PO TABS: 4.0000 mg | ORAL_TABLET | Freq: Four times a day (QID) | ORAL | Status: DC | PRN

## 2020-10-11 MED ORDER — HYDROCODONE-ACETAMINOPHEN 7.5-325 MG PO TABS
1.0000 | ORAL_TABLET | ORAL | Status: DC | PRN
  Administered 2020-10-11 – 2020-10-12 (×2): 1 via ORAL
  Filled 2020-10-11 (×2): qty 1

## 2020-10-11 MED ORDER — ASCORBIC ACID 500 MG PO TABS
1000.0000 mg | ORAL_TABLET | Freq: Every morning | ORAL | Status: DC
  Administered 2020-10-12 – 2020-10-13 (×2): 1000 mg via ORAL
  Filled 2020-10-11: qty 2

## 2020-10-11 MED ORDER — CHLORHEXIDINE GLUCONATE 0.12 % MT SOLN: 15.0000 mL | Freq: Once | OROMUCOSAL | Status: AC

## 2020-10-11 MED ORDER — BUPIVACAINE-EPINEPHRINE (PF) 0.25% -1:200000 IJ SOLN
INTRAMUSCULAR | Status: AC
  Filled 2020-10-11: qty 30

## 2020-10-11 MED ORDER — DOCUSATE SODIUM 100 MG PO CAPS
100.0000 mg | ORAL_CAPSULE | Freq: Two times a day (BID) | ORAL | Status: DC
  Administered 2020-10-11 – 2020-10-13 (×5): 100 mg via ORAL
  Filled 2020-10-11 (×5): qty 1

## 2020-10-11 MED ORDER — POLYETHYLENE GLYCOL 3350 17 G PO PACK
17.0000 g | PACK | Freq: Every day | ORAL | Status: DC | PRN
  Administered 2020-10-13: 17 g via ORAL
  Filled 2020-10-11: qty 1

## 2020-10-11 MED ORDER — ENOXAPARIN SODIUM 40 MG/0.4ML IJ SOSY
40.0000 mg | PREFILLED_SYRINGE | INTRAMUSCULAR | Status: DC
  Administered 2020-10-12 – 2020-10-13 (×2): 40 mg via SUBCUTANEOUS
  Filled 2020-10-11 (×2): qty 0.4

## 2020-10-11 MED ORDER — GLYCOPYRROLATE 0.2 MG/ML IJ SOLN
INTRAMUSCULAR | Status: AC
  Filled 2020-10-11: qty 1

## 2020-10-11 MED ORDER — CYANOCOBALAMIN 500 MCG PO TABS
500.0000 ug | ORAL_TABLET | Freq: Every day | ORAL | Status: DC
  Administered 2020-10-12 – 2020-10-13 (×2): 500 ug via ORAL
  Filled 2020-10-11 (×2): qty 1

## 2020-10-11 MED ORDER — SODIUM CHLORIDE FLUSH 0.9 % IV SOLN
INTRAVENOUS | Status: AC
  Filled 2020-10-11: qty 40

## 2020-10-11 MED ORDER — ZOLPIDEM TARTRATE 5 MG PO TABS
5.0000 mg | ORAL_TABLET | Freq: Every evening | ORAL | Status: DC | PRN
Start: 2020-10-11 — End: 2020-10-13

## 2020-10-11 MED ORDER — LACTATED RINGERS IV SOLN: INTRAVENOUS | Status: DC

## 2020-10-11 MED ORDER — ALBUTEROL SULFATE HFA 108 (90 BASE) MCG/ACT IN AERS
2.0000 | INHALATION_SPRAY | RESPIRATORY_TRACT | Status: DC | PRN
  Filled 2020-10-11: qty 6.7

## 2020-10-11 MED ORDER — NEOMYCIN-POLYMYXIN B GU 40-200000 IR SOLN
Status: AC
  Filled 2020-10-11: qty 4

## 2020-10-11 MED ORDER — CLINDAMYCIN PHOSPHATE 900 MG/50ML IV SOLN
INTRAVENOUS | Status: AC
  Filled 2020-10-11: qty 50

## 2020-10-11 MED ORDER — MORPHINE SULFATE (PF) 2 MG/ML IV SOLN: 0.5000 mg | INTRAVENOUS | Status: DC | PRN

## 2020-10-11 MED ORDER — OXYCODONE HCL 5 MG PO TABS: 5.0000 mg | ORAL_TABLET | Freq: Once | ORAL | Status: DC | PRN

## 2020-10-11 MED ORDER — TRAMADOL HCL 50 MG PO TABS
50.0000 mg | ORAL_TABLET | Freq: Four times a day (QID) | ORAL | Status: DC
  Administered 2020-10-11 – 2020-10-13 (×8): 50 mg via ORAL
  Filled 2020-10-11 (×8): qty 1

## 2020-10-11 MED ORDER — OXYCODONE HCL 5 MG/5ML PO SOLN: 5.0000 mg | Freq: Once | ORAL | Status: DC | PRN

## 2020-10-11 MED ORDER — ANASTROZOLE 1 MG PO TABS
1.0000 mg | ORAL_TABLET | Freq: Every morning | ORAL | Status: DC
  Administered 2020-10-12 – 2020-10-13 (×2): 1 mg via ORAL
  Filled 2020-10-11 (×2): qty 1

## 2020-10-11 MED ORDER — ATORVASTATIN CALCIUM 20 MG PO TABS
20.0000 mg | ORAL_TABLET | Freq: Every day | ORAL | Status: DC
  Administered 2020-10-11 – 2020-10-12 (×2): 20 mg via ORAL
  Filled 2020-10-11 (×2): qty 1

## 2020-10-11 MED ORDER — ONDANSETRON HCL 4 MG/2ML IJ SOLN: 4.0000 mg | Freq: Four times a day (QID) | INTRAMUSCULAR | Status: DC | PRN

## 2020-10-11 MED ORDER — ACETAMINOPHEN 325 MG PO TABS
325.0000 mg | ORAL_TABLET | Freq: Four times a day (QID) | ORAL | Status: DC | PRN
  Administered 2020-10-13: 650 mg via ORAL
  Filled 2020-10-11: qty 2

## 2020-10-11 MED ORDER — LATANOPROST 0.005 % OP SOLN
1.0000 [drp] | Freq: Every day | OPHTHALMIC | Status: DC
  Administered 2020-10-11 – 2020-10-12 (×2): 1 [drp] via OPHTHALMIC
  Filled 2020-10-11: qty 2.5

## 2020-10-11 MED ORDER — ACETAMINOPHEN 10 MG/ML IV SOLN
INTRAVENOUS | Status: DC | PRN
  Administered 2020-10-11: 1000 mg via INTRAVENOUS

## 2020-10-11 MED ORDER — FENTANYL CITRATE (PF) 100 MCG/2ML IJ SOLN: 25.0000 ug | INTRAMUSCULAR | Status: DC | PRN

## 2020-10-11 MED ORDER — HYDROCODONE-ACETAMINOPHEN 5-325 MG PO TABS: 1.0000 | ORAL_TABLET | ORAL | Status: DC | PRN

## 2020-10-11 MED ORDER — FAMOTIDINE 20 MG PO TABS
ORAL_TABLET | ORAL | Status: AC
  Administered 2020-10-11: 20 mg via ORAL
  Filled 2020-10-11: qty 1

## 2020-10-11 MED ORDER — PHENOL 1.4 % MT LIQD
1.0000 | OROMUCOSAL | Status: DC | PRN
Start: 2020-10-11 — End: 2020-10-13
  Filled 2020-10-11: qty 177

## 2020-10-11 MED ORDER — ONDANSETRON HCL 4 MG/2ML IJ SOLN
INTRAMUSCULAR | Status: AC
  Filled 2020-10-11: qty 2

## 2020-10-11 MED ORDER — SODIUM CHLORIDE 0.9 % IV SOLN: INTRAVENOUS | Status: DC

## 2020-10-11 MED ORDER — METHOCARBAMOL 1000 MG/10ML IJ SOLN
500.0000 mg | Freq: Four times a day (QID) | INTRAVENOUS | Status: DC | PRN
  Filled 2020-10-11: qty 5

## 2020-10-11 MED ORDER — TRIAMTERENE-HCTZ 37.5-25 MG PO TABS
1.0000 | ORAL_TABLET | Freq: Every day | ORAL | Status: DC
  Administered 2020-10-12 – 2020-10-13 (×2): 1 via ORAL
  Filled 2020-10-11 (×2): qty 1

## 2020-10-11 MED ORDER — TORSEMIDE 10 MG PO TABS
10.0000 mg | ORAL_TABLET | Freq: Every day | ORAL | Status: DC | PRN
  Filled 2020-10-11: qty 1

## 2020-10-11 MED ORDER — PANTOPRAZOLE SODIUM 40 MG PO TBEC
40.0000 mg | DELAYED_RELEASE_TABLET | Freq: Every day | ORAL | Status: DC
  Administered 2020-10-11 – 2020-10-13 (×3): 40 mg via ORAL
  Filled 2020-10-11 (×3): qty 1

## 2020-10-11 MED ORDER — MENTHOL 3 MG MT LOZG
1.0000 | LOZENGE | OROMUCOSAL | Status: DC | PRN
  Filled 2020-10-11: qty 9

## 2020-10-11 MED ORDER — CALCIUM CARBONATE 1250 (500 CA) MG PO TABS
1250.0000 mg | ORAL_TABLET | Freq: Every day | ORAL | Status: DC
  Administered 2020-10-12 – 2020-10-13 (×2): 1250 mg via ORAL
  Filled 2020-10-11 (×2): qty 1

## 2020-10-11 MED ORDER — GLYCOPYRROLATE 0.2 MG/ML IJ SOLN
INTRAMUSCULAR | Status: DC | PRN
  Administered 2020-10-11: .2 mg via INTRAVENOUS

## 2020-10-11 MED ORDER — PROPOFOL 10 MG/ML IV BOLUS
INTRAVENOUS | Status: DC | PRN
  Administered 2020-10-11: 50 mg via INTRAVENOUS

## 2020-10-11 MED ORDER — BUPIVACAINE LIPOSOME 1.3 % IJ SUSP
INTRAMUSCULAR | Status: AC
  Filled 2020-10-11: qty 20

## 2020-10-11 MED ORDER — VITAMIN D 25 MCG (1000 UNIT) PO TABS
2000.0000 [IU] | ORAL_TABLET | Freq: Every day | ORAL | Status: DC
  Administered 2020-10-12 – 2020-10-13 (×2): 2000 [IU] via ORAL
  Filled 2020-10-11 (×2): qty 2

## 2020-10-11 MED ORDER — METHOCARBAMOL 500 MG PO TABS: 500.0000 mg | ORAL_TABLET | Freq: Four times a day (QID) | ORAL | Status: DC | PRN

## 2020-10-11 MED ORDER — CLINDAMYCIN PHOSPHATE 900 MG/50ML IV SOLN
900.0000 mg | INTRAVENOUS | Status: AC
  Administered 2020-10-11: 900 mg via INTRAVENOUS

## 2020-10-11 MED ORDER — BISACODYL 10 MG RE SUPP: 10.0000 mg | Freq: Every day | RECTAL | Status: DC | PRN

## 2020-10-11 MED ORDER — ALUM & MAG HYDROXIDE-SIMETH 200-200-20 MG/5ML PO SUSP: 30.0000 mL | ORAL | Status: DC | PRN

## 2020-10-11 MED ORDER — ONDANSETRON HCL 4 MG/2ML IJ SOLN
INTRAMUSCULAR | Status: DC | PRN
  Administered 2020-10-11: 4 mg via INTRAVENOUS

## 2020-10-11 SURGICAL SUPPLY — 62 items
BLADE SAGITTAL AGGR TOOTH XLG (BLADE) ×3 IMPLANT
BNDG COHESIVE 6X5 TAN STRL LF (GAUZE/BANDAGES/DRESSINGS) ×9 IMPLANT
CANISTER SUCT 1200ML W/VALVE (MISCELLANEOUS) ×3 IMPLANT
CANISTER WOUND CARE 500ML ATS (WOUND CARE) ×3 IMPLANT
CHLORAPREP W/TINT 26 (MISCELLANEOUS) ×3 IMPLANT
COVER BACK TABLE REUSABLE LG (DRAPES) ×3 IMPLANT
COVER WAND RF STERILE (DRAPES) ×3 IMPLANT
DRAPE 3/4 80X56 (DRAPES) ×9 IMPLANT
DRAPE C-ARM XRAY 36X54 (DRAPES) ×3 IMPLANT
DRAPE INCISE IOBAN 66X60 STRL (DRAPES) IMPLANT
DRAPE POUCH INSTRU U-SHP 10X18 (DRAPES) ×3 IMPLANT
DRESSING SURGICEL FIBRLLR 1X2 (HEMOSTASIS) ×2 IMPLANT
DRSG MEPILEX SACRM 8.7X9.8 (GAUZE/BANDAGES/DRESSINGS) ×3 IMPLANT
DRSG OPSITE POSTOP 4X8 (GAUZE/BANDAGES/DRESSINGS) ×6 IMPLANT
DRSG SURGICEL FIBRILLAR 1X2 (HEMOSTASIS) ×6
ELECT BLADE 6.5 EXT (BLADE) ×3 IMPLANT
ELECT REM PT RETURN 9FT ADLT (ELECTROSURGICAL) ×3
ELECTRODE REM PT RTRN 9FT ADLT (ELECTROSURGICAL) ×1 IMPLANT
GLOVE SURG SYN 9.0  PF PI (GLOVE) ×4
GLOVE SURG SYN 9.0 PF PI (GLOVE) ×2 IMPLANT
GLOVE SURG UNDER POLY LF SZ9 (GLOVE) ×3 IMPLANT
GOWN SRG 2XL LVL 4 RGLN SLV (GOWNS) ×1 IMPLANT
GOWN STRL NON-REIN 2XL LVL4 (GOWNS) ×2
GOWN STRL REUS W/ TWL LRG LVL3 (GOWN DISPOSABLE) ×1 IMPLANT
GOWN STRL REUS W/TWL LRG LVL3 (GOWN DISPOSABLE) ×2
HEMOVAC 400CC 10FR (MISCELLANEOUS) IMPLANT
HIP FEM HD S 28 (Head) ×3 IMPLANT
HOLDER FOLEY CATH W/STRAP (MISCELLANEOUS) ×3 IMPLANT
HOOD PEEL AWAY FLYTE STAYCOOL (MISCELLANEOUS) ×3 IMPLANT
IRRIGATION SURGIPHOR STRL (IV SOLUTION) IMPLANT
KIT PREVENA INCISION MGT 13 (CANNISTER) ×3 IMPLANT
LINER DM 28MM (Liner) ×3 IMPLANT
MANIFOLD NEPTUNE II (INSTRUMENTS) ×3 IMPLANT
MAT ABSORB  FLUID 56X50 GRAY (MISCELLANEOUS) ×2
MAT ABSORB FLUID 56X50 GRAY (MISCELLANEOUS) ×1 IMPLANT
NDL SAFETY ECLIPSE 18X1.5 (NEEDLE) ×1 IMPLANT
NEEDLE HYPO 18GX1.5 SHARP (NEEDLE) ×2
NEEDLE SPNL 20GX3.5 QUINCKE YW (NEEDLE) ×6 IMPLANT
NS IRRIG 1000ML POUR BTL (IV SOLUTION) ×3 IMPLANT
PACK HIP COMPR (MISCELLANEOUS) ×3 IMPLANT
SCALPEL PROTECTED #10 DISP (BLADE) ×6 IMPLANT
SEALER BIPOLAR AQUA 6.0 (INSTRUMENTS) ×3 IMPLANT
SHELL ACETABULAR DM  48MM (Shell) ×3 IMPLANT
SOL PREP PVP 2OZ (MISCELLANEOUS) ×3
SOLUTION PREP PVP 2OZ (MISCELLANEOUS) ×1 IMPLANT
SPONGE DRAIN TRACH 4X4 STRL 2S (GAUZE/BANDAGES/DRESSINGS) ×3 IMPLANT
STAPLER SKIN PROX 35W (STAPLE) ×3 IMPLANT
STEM FEMORAL 1 STD COLLARED (Stem) ×3 IMPLANT
STRAP SAFETY 5IN WIDE (MISCELLANEOUS) ×3 IMPLANT
SUT DVC 2 QUILL PDO  T11 36X36 (SUTURE) ×2
SUT DVC 2 QUILL PDO T11 36X36 (SUTURE) ×1 IMPLANT
SUT SILK 0 (SUTURE) ×2
SUT SILK 0 30XBRD TIE 6 (SUTURE) ×1 IMPLANT
SUT V-LOC 90 ABS DVC 3-0 CL (SUTURE) ×3 IMPLANT
SUT VIC AB 1 CT1 36 (SUTURE) ×3 IMPLANT
SYR 20ML LL LF (SYRINGE) ×3 IMPLANT
SYR 30ML LL (SYRINGE) ×3 IMPLANT
SYR 50ML LL SCALE MARK (SYRINGE) ×6 IMPLANT
SYR BULB IRRIG 60ML STRL (SYRINGE) ×3 IMPLANT
TAPE MICROFOAM 4IN (TAPE) ×3 IMPLANT
TOWEL OR 17X26 4PK STRL BLUE (TOWEL DISPOSABLE) ×3 IMPLANT
TRAY FOLEY MTR SLVR 16FR STAT (SET/KITS/TRAYS/PACK) ×3 IMPLANT

## 2020-10-11 NOTE — Anesthesia Procedure Notes (Signed)
Spinal  Patient location during procedure: OR Reason for block: surgical anesthesia Staffing Performed: anesthesiologist  Anesthesiologist: Piscitello, Precious Haws, MD Resident/CRNA: Aline Brochure, CRNA Preanesthetic Checklist Completed: patient identified, IV checked, site marked, risks and benefits discussed, surgical consent, monitors and equipment checked, pre-op evaluation and timeout performed Spinal Block Patient position: sitting Prep: ChloraPrep Patient monitoring: heart rate, continuous pulse ox, blood pressure and cardiac monitor Approach: midline Location: L4-5 Injection technique: single-shot Needle Needle type: Introducer and Pencan  Needle gauge: 24 G Needle length: 9 cm Assessment Sensory level: T10 Events: CSF return Additional Notes Negative paresthesia. Negative blood return. Positive free-flowing CSF. Expiration date of kit checked and confirmed. Patient tolerated procedure well, without complications.

## 2020-10-11 NOTE — Op Note (Signed)
10/11/2020  11:03 AM  PATIENT:  Marissa Nelson  70 y.o. 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  PROCEDURE:  Procedure(s): TOTAL HIP ARTHROPLASTY ANTERIOR APPROACH (Right)  SURGEON: Laurene Footman, MD  ASSISTANTS: none  ANESTHESIA:   spinal  EBL:  Total I/O In: 1150 [I.V.:1000; IV Piggyback:150] Out: 200 [Blood:200]  BLOOD ADMINISTERED:none  DRAINS:  incisional wound VAC    LOCAL MEDICATIONS USED:  MARCAINE   , XYLOCAINE , and OTHER Exparel  SPECIMEN:  Source of Specimen:    right femoral head  DISPOSITION OF SPECIMEN:  PATHOLOGY  COUNTS:  YES  TOURNIQUET:  * No tourniquets in log *  IMPLANTS: Medacta AMIS 1 standard stem, 48 mm Mpact DM cup and liner with metal S 28 mm head  DICTATION: .Dragon Dictation   The patient was brought to the operating room and after spinal anesthesia was obtained patient was placed on the operative table with the ipsilateral foot into the Medacta attachment, contralateral leg on a well-padded table. C-arm was brought in and preop template x-ray taken. After prepping and draping in usual sterile fashion appropriate patient identification and timeout procedures were completed. Anterior approach to the hip was obtained and centered over the greater trochanter and TFL muscle.  The patient felt a portion of this incision so 20 cc of 1% Xylocaine was overtreated in the area of the incision skin incision and allowed to set.  Following this the spinal was working well.  The subcutaneous tissue was incised hemostasis being achieved by electrocautery. TFL fascia was incised and the muscle retracted laterally deep retractor placed. The lateral femoral circumflex vessels were identified and ligated. The anterior capsule was exposed and a capsulotomy performed. The neck was identified and a femoral neck cut carried out with a saw. The head was removed without difficulty and showed sclerotic  femoral head and acetabulum. Reaming was carried out to 48 mm and a 48 mm cup trial gave appropriate tightness to the acetabular component a 48 DM cup was impacted into position. The leg was then externally rotated and ischiofemoral and pubofemoral releases carried out. The femur was sequentially broached to a size 1, size 1 standard with S head trials were placed and the final components chosen. The 1 standard stem was inserted along with a metal S 28 mm head and 48 mm liner. The hip was reduced and was stable the wound was thoroughly irrigated with fibrillar placed along the posterior capsule and medial neck. The deep fascia ws closed using a heavy Quill after infiltration of 30 cc of quarter percent Sensorcaine with epinephrine diluted with Exparel throughout the case .3-0 V-loc to close the skin with skin staples.  Incisional wound VAC applied patient was sent to recovery in stable condition.   PLAN OF CARE: Admit to inpatient

## 2020-10-11 NOTE — Evaluation (Addendum)
Physical Therapy Evaluation Patient Details Name: Marissa Nelson MRN: [id number] DOB: 1950-08-05 Today's Date: 10/11/2020   History of Present Illness  Patient is a 70 year old female with arthritis of right hip who elected to have THA. Past medical history of anemia, HTN, arthritis, renal disease  Clinical Impression  Patient agreeable to PT. Patient reports she is previously independent with mobility with occasional use of cane. Patient lives at home with her spouse.  Patient currently required minimal assistance for bed mobility and sit to stand transfer. Mild right knee buckling noted in standing with pre-gait standing activity. Mild pain reported in right hip with activity. Recommend PT to maximize independence and increase safety awareness in preparation for discharge home with family support.     Follow Up Recommendations Home health PT    Equipment Recommendations  Rolling walker with 5" wheels    Recommendations for Other Services       Precautions / Restrictions Precautions Precautions: Fall;Anterior Hip Precaution Booklet Issued: Yes (comment) Restrictions Weight Bearing Restrictions: Yes RLE Weight Bearing: Weight bearing as tolerated      Mobility  Bed Mobility Overal bed mobility: Needs Assistance Bed Mobility: Supine to Sit;Sit to Supine     Supine to sit: Min guard Sit to supine: Min assist   General bed mobility comments: assistance for RLE to return to bed. verbal cues for sequencing and technique    Transfers Overall transfer level: Needs assistance Equipment used: Rolling walker (2 wheeled) Transfers: Sit to/from Stand Sit to Stand: Min assist         General transfer comment: lifting assistance required for ambulation  Ambulation/Gait             General Gait Details: not asessed this session. patient does have mild right knee buckling with weight bearing x 2 bouts  Stairs            Wheelchair Mobility    Modified Rankin  (Stroke Patients Only)       Balance Overall balance assessment: Needs assistance Sitting-balance support: Feet supported Sitting balance-Leahy Scale: Good     Standing balance support: Bilateral upper extremity supported;During functional activity Standing balance-Leahy Scale: Fair Standing balance comment: patient relying on rolling walker for suppor in standing                             Pertinent Vitals/Pain Pain Assessment: Faces Faces Pain Scale: Hurts a little bit Pain Location: right hip Pain Descriptors / Indicators: Discomfort Pain Intervention(s): Limited activity within patient's tolerance    Home Living Family/patient expects to be discharged to:: Private residence Living Arrangements: Spouse/significant other Available Help at Discharge: Family Type of Home: House Home Access:  (one step to enter)     Home Layout: One level Home Equipment: Cane - single point      Prior Function Level of Independence: Independent         Comments: intermittent use of cane for ambulation, independent otherwise and still works     Journalist, newspaper        Extremity/Trunk Assessment   Upper Extremity Assessment Upper Extremity Assessment: Overall WFL for tasks assessed    Lower Extremity Assessment Lower Extremity Assessment: RLE deficits/detail (LLE WNL) RLE Deficits / Details: patient able to perform partial SLR, LAQ. not formally tested due to acuity of procedure RLE Sensation: WNL       Communication   Communication: No difficulties  Cognition Arousal/Alertness: Awake/alert Behavior  During Therapy: WFL for tasks assessed/performed Overall Cognitive Status: Within Functional Limits for tasks assessed                                        General Comments      Exercises Total Joint Exercises Ankle Circles/Pumps: AROM;Strengthening;Right;10 reps;Supine Quad Sets: AROM;Strengthening;Right;10 reps;Supine Hip  ABduction/ADduction: AAROM;Strengthening;Right;10 reps;Supine Long Arc Quad: Strengthening;Right;5 reps;Seated;AROM Other Exercises Other Exercises: verbal cues for exercise technique   Assessment/Plan    PT Assessment Patient needs continued PT services  PT Problem List Decreased strength;Decreased activity tolerance;Decreased balance;Decreased mobility;Decreased safety awareness       PT Treatment Interventions DME instruction;Gait training;Stair training;Functional mobility training;Therapeutic activities;Therapeutic exercise;Balance training;Neuromuscular re-education;Patient/family education    PT Goals (Current goals can be found in the Care Plan section)  Acute Rehab PT Goals Patient Stated Goal: to go home PT Goal Formulation: With patient Time For Goal Achievement: 10/25/20 Potential to Achieve Goals: Good    Frequency BID   Barriers to discharge        Co-evaluation               AM-PAC PT "6 Clicks" Mobility  Outcome Measure Help needed turning from your back to your side while in a flat bed without using bedrails?: A Little Help needed moving from lying on your back to sitting on the side of a flat bed without using bedrails?: A Little Help needed moving to and from a bed to a chair (including a wheelchair)?: A Little Help needed standing up from a chair using your arms (e.g., wheelchair or bedside chair)?: A Little Help needed to walk in hospital room?: A Little Help needed climbing 3-5 steps with a railing? : A Little 6 Click Score: 18    End of Session   Activity Tolerance: Patient tolerated treatment well Patient left: in bed;with call bell/phone within reach;with bed alarm set;with family/visitor present;with SCD's reapplied;with nursing/sitter in room (ice pack reapplied right hip) Nurse Communication: Mobility status PT Visit Diagnosis: Unsteadiness on feet (R26.81);Muscle weakness (generalized) (M62.81);Difficulty in walking, not elsewhere  classified (R26.2)    Time: 9767-3419 PT Time Calculation (min) (ACUTE ONLY): 22 min   Charges:   PT Evaluation $PT Eval Moderate Complexity: 1 Mod PT Treatments $Therapeutic Exercise: 8-22 mins        Minna Merritts, PT, MPT   Percell Locus 10/11/2020, 4:09 PM

## 2020-10-11 NOTE — Progress Notes (Signed)
PT Cancellation Note  Patient Details Name: Marissa Nelson MRN: [id number] DOB: [birth year]   Cancelled Treatment:    Reason Eval/Treat Not Completed: Other (comment) Patient eating lunch and requesting PT return at a later time. PT will continue with attempts.   Minna Merritts, PT, MPT  Percell Locus 10/11/2020, 3:33 PM

## 2020-10-11 NOTE — Progress Notes (Signed)
Pt has had varying temps in pacu with different thermometers.  Pt has denied feeling cold the entire time in pacu.  Skin was assessed every 30 in with regional and head to toe assessment and has never felt cool to touch.  Pt was c/o feeling hot with bair hugger on.  BP/HR/sats all WNL.  Dr piscitello aware and ok with pt going to floor.

## 2020-10-11 NOTE — H&P (Signed)
Chief Complaint  Patient presents with   Pre-op Exam  Right THA schedule 10/11/20 by Dr. Rudene Christians    History of the Present Illness: Marissa Nelson is a 70 y.o. female here today for history and physical for right total hip arthroplasty with Dr. Hessie Knows on 10/11/2020. Patient has advanced right hip osteoarthritis. She is seen ultrasound-guided specialist who underwent intra-articular cortisone injection with little relief. Pain is located in her groin, lateral hip and radiates to the knee. She has a hard time with activities involving hip flexion. Patient takes over-the-counter medications with little relief.  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   I have reviewed past medical, surgical, social and family history, and allergies as documented in the EMR.  Past Medical History: Past Medical History:  Diagnosis Date   Anemia in chronic kidney disease 05/25/2019   Arthralgia 10/04/2016  Last Assessment & Plan: Formatting of this note might be different from the original. With positive ANA; going to see rheum this week   Benign essential HTN 12/07/2006  Last Assessment & Plan: Well-controlled; so proud of patient's efforts at eating better and losing weight   Cancer (CMS-HCC)   Chicken pox   Chronic kidney disease, stage III (moderate) (CMS-HCC) 11/15/2015  Last Assessment & Plan: See kidney doctor soon; check urine today; avoid NSAIDs; stay hydrated; kidney disease does run in the family   Chronic left shoulder pain 11/08/2014   History of breast cancer   HLD (hyperlipidemia) 06/15/2015  Last Assessment & Plan: Formatting of this note might be different from the original. Refilled statin; she is working on [employer], avoid cow and pig food   Hyperlipidemia   Hypertension   Hyperuricemia 08/25/2019   Numbness and tingling in left hand 11/07/2014   Obesity (BMI 30.0-34.9), unspecified 02/24/2018  Last  Assessment & Plan: Formatting of this note might be different from the original. She is really working on weight loss; see AVS   Obstructive apnea 06/15/2015  Last Assessment & Plan: Sleep study ordered by GI last year, order still valid, patient given number to call and set up appointment   Prediabetes 06/20/2016  Last Assessment & Plan: Formatting of this note might be different from the original. Check A1c on or after January 4th; so proud of her weight loss and healthier lifetstyle efforts; explained that significant weight loss is the single most important thing she can do to help prevent type 2 diabetes   Vitamin B12 deficiency 05/07/2017  Last Assessment & Plan: Formatting of this note might be different from the original. Check level in Jan   Vitamin D deficiency 10/29/2015  Last Assessment & Plan: Formatting of this note might be different from the original. Start some extra vitmain D during the winter to build that up; it is coming up over the last 4 draws   Past Surgical History: Past Surgical History:  Procedure Laterality Date   breast surgey Right   HYSTERECTOMY   KNEE ARTHROSCOPY Right   Past Family History: Family History  Problem Relation Age of Onset   Diabetes Mother   Kidney disease Father   Kidney disease Sister   No Known Problems Brother   Medications: Current Outpatient Medications Ordered in Epic  Medication Sig Dispense Refill   acetaminophen (TYLENOL) 325 MG tablet Take 325 mg by mouth every 4 (four) hours as needed   albuterol 90 mcg/actuation inhaler Inhale  2-4 puffs by mouth every 4 hours as needed for wheezing, cough, and/or shortness of breath   amLODIPine (NORVASC) 10 MG tablet Take 10 mg by mouth once daily   anastrozole (ARIMIDEX) 1 mg tablet Take 1 mg by mouth once daily   anastrozole (ARIMIDEX) 1 mg tablet TAKE ONE TABLET BY MOUTH EVERY DAY   atorvastatin (LIPITOR) 20 MG tablet Take by mouth   calcium carbonate 600 mg calcium (1,500 mg) Tab tablet  Take by mouth   calcium carbonate-vitamin D3 (OS-CAL 500+D) 500 mg-5 mcg (200 unit) tablet Take 1 tablet by mouth once daily   cyanocobalamin, vitamin B-12, 500 mcg Subl Place 500 mcg under the tongue once daily   metoprolol succinate (TOPROL-XL) 25 MG XL tablet Take 25 mg by mouth once daily   predniSONE (DELTASONE) 10 MG tablet Take 6 tabs (60mg ) by mouth followed by 5 tabs (50mg ), then 4 tabs (40mg ), then 3 tabs (30mg ), then 2 tabs (20mg ), then 1 tab (10mg ) 21 tablet 0   triamterene-hydrochlorothiazide (MAXZIDE-25) 37.5-25 mg tablet triamterene 37.5 mg-hydrochlorothiazide 25 mg tablet   No current Epic-ordered facility-administered medications on file.   Allergies: Allergies  Allergen Reactions   Penicillin Rash   Shellfish Containing Products Swelling  angioedema    Body mass index is 33.07 kg/m.  Review of Systems: A comprehensive 14 point ROS was performed, reviewed, and the pertinent orthopaedic findings are documented in the HPI.  Vitals:  10/03/20 1510  BP: 118/80    General Physical Examination:   General:  Well developed, well nourished, no apparent distress, normal affect, antalgic gait.  HEENT: Head normocephalic, atraumatic, PERRL.   Abdomen: Soft, non tender, non distended, Bowel sounds present.  Heart: Examination of the heart reveals regular, rate, and rhythm. There is no murmur noted on ascultation. There is a normal apical pulse.  Lungs: Lungs are clear to auscultation. There is no wheeze, rhonchi, or crackles. There is normal expansion of bilateral chest walls.   Musculoskeletal Examination:  Patient ambulates with antalgic gait. Right hip internal rotation is 15 degrees, external to 20 degrees with pain. Right hip flexion contracture of 10 degrees. No swelling or edema in the right lower extremity.  Radiographs: X-rays of the right hip reviewed by me today show severe central joint space narrowing with severe sclerotic changes in superior  acetabulum. No evidence of acute bony abnormality.  Assessment: ICD-10-CM  1. Primary osteoarthritis of right hip M16.11   Plan: 62. 70 year old female with severe right hip osteoarthritis. Pain is interfering with quality of life and activities daily living. Risks, benefits, complications of a right total hip arthroplasty been discussed with the patient. Patient has agreed and consent procedure with Dr. Hessie Knows on 10/11/2020.   Electronically signed by Feliberto Gottron, [place] at 10/03/2020 3:43 PM EDT  Reviewed  H+P. No changes noted.

## 2020-10-11 NOTE — Anesthesia Preprocedure Evaluation (Signed)
Anesthesia Evaluation  Patient identified by MRN, date of birth, ID band Patient awake    Reviewed: Allergy & Precautions, NPO status , Patient's Chart, lab work & pertinent test results  History of Anesthesia Complications (+) PONV and history of anesthetic complications  Airway Mallampati: III  TM Distance: <3 FB Neck ROM: limited    Dental  (+) Chipped   Pulmonary neg pulmonary ROS, neg shortness of breath,    Pulmonary exam normal        Cardiovascular Exercise Tolerance: Good hypertension, (-) angina(-) Past MI and (-) DOE Normal cardiovascular exam     Neuro/Psych negative neurological ROS  negative psych ROS   GI/Hepatic negative GI ROS, Neg liver ROS,   Endo/Other  negative endocrine ROS  Renal/GU Renal disease     Musculoskeletal  (+) Arthritis ,   Abdominal   Peds  Hematology negative hematology ROS (+)   Anesthesia Other Findings Past Medical History: 01/17/2016: Abnormal mammogram of right breast     Comment:  Recommendation: Six month follow-up diagnostic mammogram              of the right breast for an additional group of probably               benign Calcifications. Sept 2017 --> due March 2018 10/04/2016: Arthralgia No date: Arthritis     Comment:  left hand 2018: Breast cancer ([facility])     Comment:  right breast DCIS with rad tx 11/15/2015: Breast cancer screening 11/15/2015: Chronic kidney disease (CKD), stage III (moderate) ([facility]) 11/08/2014: Chronic left shoulder pain 12/31/2016: DCIS (ductal carcinoma in situ) of breast     Comment:  RIGHT, August 2018 06/12/2016: Family history of diabetes mellitus     Comment:  Mother and several other relatives 06/15/2015: HLD (hyperlipidemia) 01/17/2016: Hx of right breast biopsy No date: Hyperlipidemia No date: Hypertension 11/15/2015: Medication monitoring encounter No date: Motion sickness     Comment:  ocean ships 11/07/2014: Numbness and tingling in left  hand 10/25/2015: Obesity 10/25/2015: Obesity (BMI 30.0-34.9) 2018: Personal history of radiation therapy     Comment:  right breast ca No date: PONV (postoperative nausea and vomiting)     Comment:  patient stated she vomits a lot after anesthsia 10/04/2016: Positive ANA (antinuclear antibody) No date: Prediabetes 11/15/2015: Preventative health care 10/29/2015: Vitamin D deficiency  Past Surgical History: No date: ABDOMINAL HYSTERECTOMY 01/15/2016: BREAST BIOPSY; Right     Comment:   cylinder shape calcs UOQ, FIBROADENOMATOUS 12/30/2016: BREAST BIOPSY; Right     Comment:  stereo bx for calcifications: DCIS UOQ, ribbon shaped 01/08/2017: BREAST BIOPSY; Right     Comment:  LOQ x shaped, FIBROADENOMATOUS CHANGE WITH 03/09/2019: BREAST BIOPSY; Left     Comment:  calcs bx, x marker path pending 01/30/2018: BREAST LUMPECTOMY; Right     Comment:  DCIS clear margins 01/30/2017: BREAST LUMPECTOMY WITH NEEDLE LOCALIZATION; Right     Comment:  Procedure: BREAST LUMPECTOMY WITH NEEDLE LOCALIZATION;                Surgeon: Vickie Epley, MD;  Location: [facility];                Service: General;  Laterality: Right; 11/19/2016: COLONOSCOPY WITH PROPOFOL; N/A     Comment:  Procedure: COLONOSCOPY WITH PROPOFOL;  Surgeon: Jonathon Bellows, MD;  Location: [facility];  Service:  Endoscopy;  Laterality: N/A; 11/19/2016: ESOPHAGOGASTRODUODENOSCOPY (EGD) WITH PROPOFOL; N/A     Comment:  Procedure: ESOPHAGOGASTRODUODENOSCOPY (EGD) WITH               PROPOFOL;  Surgeon: Jonathon Bellows, MD;  Location: [facility];  Service: Endoscopy;  Laterality: N/A; 12/12/2016: GIVENS CAPSULE STUDY; N/A     Comment:  Procedure: GIVENS CAPSULE STUDY;  Surgeon: Jonathon Bellows,               MD;  Location[facility];  Service:               Gastroenterology;  Laterality: N/A; 01/30/2017: TOTAL MASTECTOMY; Right     Comment:  Error  BMI    Body Mass Index: 32.37 kg/m       Reproductive/Obstetrics negative OB ROS                             Anesthesia Physical Anesthesia Plan  ASA: 3  Anesthesia Plan: Spinal   Post-op Pain Management:    Induction:   PONV Risk Score and Plan:   Airway Management Planned: Natural Airway and Nasal Cannula  Additional Equipment:   Intra-op Plan:   Post-operative Plan:   Informed Consent: I have reviewed the patients History and Physical, chart, labs and discussed the procedure including the risks, benefits and alternatives for the proposed anesthesia with the patient or authorized representative who has indicated his/her understanding and acceptance.     Dental Advisory Given  Plan Discussed with: Anesthesiologist, CRNA and Surgeon  Anesthesia Plan Comments: (Patient reports no bleeding problems and no anticoagulant use.  Plan for spinal with backup GA  Patient consented for risks of anesthesia including but not limited to:  - adverse reactions to medications - damage to eyes, teeth, lips or other oral mucosa - nerve damage due to positioning  - risk of bleeding, infection and or nerve damage from spinal that could lead to paralysis - risk of headache or failed spinal - damage to teeth, lips or other oral mucosa - sore throat or hoarseness - damage to heart, brain, nerves, lungs, other parts of body or loss of life  Patient voiced understanding.)        Anesthesia Quick Evaluation

## 2020-10-11 NOTE — Progress Notes (Signed)
Entered manually 

## 2020-10-11 NOTE — Transfer of Care (Signed)
Immediate Anesthesia Transfer of Care Note  Patient: Marissa Nelson  Procedure(s) Performed: TOTAL HIP ARTHROPLASTY ANTERIOR APPROACH (Right: Hip)  Patient Location: PACU  Anesthesia Type:Spinal  Level of Consciousness: awake  Airway & Oxygen Therapy: Patient Spontanous Breathing and Patient connected to face mask oxygen  Post-op Assessment: Post -op Vital signs reviewed and stable  Post vital signs: stable  Last Vitals:  Vitals Value Taken Time  BP 95/57 10/11/20 1103  Temp 36.8 C 10/11/20 1103  Pulse 84 10/11/20 1106  Resp 15 10/11/20 1106  SpO2 100 % 10/11/20 1106  Vitals shown include unvalidated device data.  Last Pain:  Vitals:   10/11/20 0820  TempSrc: Tympanic  PainSc: 0-No pain         Complications: No notable events documented.

## 2020-10-12 LAB — BASIC METABOLIC PANEL
Anion gap: 6 (ref 5–15)
BUN: 22 mg/dL (ref 8–23)
CO2: 23 mmol/L (ref 22–32)
Calcium: 9.1 mg/dL (ref 8.9–10.3)
Chloride: 109 mmol/L (ref 98–111)
Creatinine, Ser: 1.4 mg/dL — ABNORMAL HIGH (ref 0.44–1.00)
GFR, Estimated: 40 mL/min — ABNORMAL LOW (ref >60)
Glucose, Bld: 135 mg/dL — ABNORMAL HIGH (ref 70–99)
Potassium: 4.3 mmol/L (ref 3.5–5.1)
Sodium: 138 mmol/L (ref 135–145)

## 2020-10-12 LAB — CBC
HCT: 30.7 % — ABNORMAL LOW (ref 36.0–46.0)
Hemoglobin: 10.8 g/dL — ABNORMAL LOW (ref 12.0–15.0)
MCH: 31.8 pg (ref 26.0–34.0)
MCHC: 35.2 g/dL (ref 30.0–36.0)
MCV: 90.3 fL (ref 80.0–100.0)
Platelets: 201 10*3/uL (ref 150–400)
RBC: 3.4 MIL/uL — ABNORMAL LOW (ref 3.87–5.11)
RDW: 13 % (ref 11.5–15.5)
WBC: 8.9 10*3/uL (ref 4.0–10.5)
nRBC: 0 % (ref 0.0–0.2)

## 2020-10-12 NOTE — Progress Notes (Signed)
Physical Therapy Treatment Patient Details Name: Marissa Nelson MRN: [id number] DOB: 19-Oct-1950 Today's Date: 10/12/2020    History of Present Illness Patient is a 70 year old female with arthritis of right hip who elected to have THA. Past medical history of anemia, HTN, arthritis, renal disease    PT Comments    Pt ready for session.  Participated in exercises as described below.  Pt to EOB with min a x 1 mostly for LE support.  Steady in sitting.  Stood for weight shifting and standing ex.  After seated rest she is able to walk 20' with RW and min guard.  Steady gait with no buckling noted today.  Remained in recliner after session.   Follow Up Recommendations  Home health PT     Equipment Recommendations  Rolling walker with 5" wheels;3in1 (PT)    Recommendations for Other Services       Precautions / Restrictions Precautions Precautions: Fall;Anterior Hip Precaution Booklet Issued: Yes (comment) Restrictions Weight Bearing Restrictions: Yes RLE Weight Bearing: Weight bearing as tolerated    Mobility  Bed Mobility Overal bed mobility: Needs Assistance Bed Mobility: Supine to Sit     Supine to sit: Min guard;Min assist          Transfers Overall transfer level: Needs assistance Equipment used: Rolling walker (2 wheeled) Transfers: Sit to/from Stand Sit to Stand: Min guard;Min assist            Ambulation/Gait Ambulation/Gait assistance: Min guard Gait Distance (Feet): 20 Feet Assistive device: Rolling walker (2 wheeled) Gait Pattern/deviations: Step-to pattern;Decreased step length - right;Decreased step length - left Gait velocity: decreased   General Gait Details: no buckling today   Stairs             Wheelchair Mobility    Modified Rankin (Stroke Patients Only)       Balance Overall balance assessment: Needs assistance Sitting-balance support: Feet supported Sitting balance-Leahy Scale: Good     Standing balance support:  Bilateral upper extremity supported;During functional activity Standing balance-Leahy Scale: Fair Standing balance comment: patient relying on rolling walker for suppor in standing                            Cognition Arousal/Alertness: Awake/alert Behavior During Therapy: WFL for tasks assessed/performed Overall Cognitive Status: Within Functional Limits for tasks assessed                                        Exercises Other Exercises Other Exercises: supine  and standing AROM RLE x 10    General Comments        Pertinent Vitals/Pain Pain Assessment: Faces Faces Pain Scale: Hurts even more Pain Descriptors / Indicators: Burning    Home Living                      Prior Function            PT Goals (current goals can now be found in the care plan section) Progress towards PT goals: Progressing toward goals    Frequency    BID      PT Plan Current plan remains appropriate    Co-evaluation              AM-PAC PT "6 Clicks" Mobility   Outcome Measure  Help needed turning from your  back to your side while in a flat bed without using bedrails?: A Little Help needed moving from lying on your back to sitting on the side of a flat bed without using bedrails?: A Little Help needed moving to and from a bed to a chair (including a wheelchair)?: A Little Help needed standing up from a chair using your arms (e.g., wheelchair or bedside chair)?: A Little Help needed to walk in hospital room?: A Little Help needed climbing 3-5 steps with a railing? : A Little 6 Click Score: 18    End of Session Equipment Utilized During Treatment: Gait belt Activity Tolerance: Patient tolerated treatment well Patient left: in chair;with call bell/phone within reach;with chair alarm set;with family/visitor present Nurse Communication: Mobility status PT Visit Diagnosis: Unsteadiness on feet (R26.81);Muscle weakness (generalized)  (M62.81);Difficulty in walking, not elsewhere classified (R26.2)     Time: 0630-1601 PT Time Calculation (min) (ACUTE ONLY): 25 min  Charges:  $Gait Training: 8-22 mins $Therapeutic Exercise: 8-22 mins                    Chesley Noon, PTA 10/12/20, 9:33 AM

## 2020-10-12 NOTE — Evaluation (Signed)
Occupational Therapy Evaluation Patient Details Name: Marissa Nelson MRN: [id number] DOB: Aug 21, 1950 Today's Date: 10/12/2020    History of Present Illness Patient is a 70 year old female with arthritis of right hip who elected to have THA. Past medical history of anemia, HTN, arthritis, renal disease   Clinical Impression   Patient presenting with decreased I in self care, balance, functional mobility/transfers, endurance, and safety awareness. Patient reports living at home with husband and occasional use of SPC PTA. Patient currently functioning supervision for mobility and standing self care tasks, min guard for sit <>stand, and min A for LB self care. Pt very pleasant and cooperative throughout session and needing min cuing for safety and technique.  Patient will benefit from acute OT to increase overall independence in the areas of ADLs, functional mobility, and safety awareness in order to safely discharge home with family.     Follow Up Recommendations  No OT follow up;Supervision - Intermittent    Equipment Recommendations  3 in 1 bedside commode       Precautions / Restrictions Precautions Precautions: Fall Precaution Booklet Issued: Yes (comment) Precaution Comments: direct anterior hip Restrictions Weight Bearing Restrictions: Yes RLE Weight Bearing: Weight bearing as tolerated      Mobility Bed Mobility Overal bed mobility: Needs Assistance Bed Mobility: Supine to Sit     Supine to sit: Min guard;Min assist     General bed mobility comments: seated in recliner chair at beginning of session    Transfers Overall transfer level: Needs assistance Equipment used: Rolling walker (2 wheeled) Transfers: Sit to/from Stand Sit to Stand: Min guard;Supervision         General transfer comment: to stand from recliner chair    Balance Overall balance assessment: Needs assistance Sitting-balance support: Feet supported Sitting balance-Leahy Scale: Good      Standing balance support: Bilateral upper extremity supported;During functional activity Standing balance-Leahy Scale: Fair Standing balance comment: patient relying on rolling walker for suppor in standing                           ADL either performed or assessed with clinical judgement   ADL Overall ADL's : Needs assistance/impaired     Grooming: Wash/dry hands;Wash/dry face;Oral care;Standing;Supervision/safety                   Toilet Transfer: Consulting civil engineer Details (indicate cue type and reason): simulated transfer         Functional mobility during ADLs: Supervision/safety;Rolling walker;Cueing for safety       Vision Patient Visual Report: No change from baseline              Pertinent Vitals/Pain Pain Assessment: 0-10 Pain Score: 3  Faces Pain Scale: Hurts even more Pain Location: right hip Pain Descriptors / Indicators: Aching;Discomfort Pain Intervention(s): Limited activity within patient's tolerance;Monitored during session;Premedicated before session;Repositioned     Hand Dominance Right   Extremity/Trunk Assessment Upper Extremity Assessment Upper Extremity Assessment: Overall WFL for tasks assessed   Lower Extremity Assessment Lower Extremity Assessment: Defer to PT evaluation       Communication Communication Communication: No difficulties   Cognition Arousal/Alertness: Awake/alert Behavior During Therapy: WFL for tasks assessed/performed Overall Cognitive Status: Within Functional Limits for tasks assessed  Exercises Other Exercises Other Exercises: supine  and standing AROM RLE x 10        Home Living Family/patient expects to be discharged to:: Private residence Living Arrangements: Spouse/significant other Available Help at Discharge: Family;Available PRN/intermittently Type of Home: House Home Access: Stairs to enter [employer] of Steps: 1 Entrance Stairs-Rails: None Home Layout: One level     Bathroom Shower/Tub: Tub/shower unit         Home Equipment: Cane - single point          Prior Functioning/Environment Level of Independence: Independent        Comments: intermittent use of cane for ambulation, independent otherwise and still works        OT Problem List: Decreased strength;Decreased knowledge of use of DME or AE;Decreased activity tolerance;Cardiopulmonary status limiting activity;Impaired balance (sitting and/or standing);Decreased safety awareness;Pain      OT Treatment/Interventions: Self-care/ADL training;Manual therapy;Therapeutic exercise;Patient/family education;Modalities;Balance training;Energy conservation;Therapeutic activities;DME and/or AE instruction    OT Goals(Current goals can be found in the care plan section) Acute Rehab OT Goals Patient Stated Goal: to go home OT Goal Formulation: With patient Time For Goal Achievement: 10/26/20 Potential to Achieve Goals: Good ADL Goals Pt Will Perform Grooming: with modified independence;standing Pt Will Perform Lower Body Dressing: with modified independence;sit to/from stand Pt Will Transfer to Toilet: with modified independence;ambulating Pt Will Perform Toileting - Clothing Manipulation and hygiene: with modified independence;sit to/from stand  OT Frequency: Min 2X/week   Barriers to D/C:    none known at this time          AM-PAC OT "6 Clicks" Daily Activity     Outcome Measure Help from another person eating meals?: None Help from another person taking care of personal grooming?: None Help from another person toileting, which includes using toliet, bedpan, or urinal?: A Little Help from another person bathing (including washing, rinsing, drying)?: A Little Help from another person to put on and taking off regular upper body clothing?: None Help from another person to put on and taking off regular lower  body clothing?: A Little 6 Click Score: 21   End of Session Equipment Utilized During Treatment: Rolling walker Nurse Communication: Mobility status  Activity Tolerance: Patient tolerated treatment well Patient left: in chair;with chair alarm set;with family/visitor present;with call bell/phone within reach  OT Visit Diagnosis: Unsteadiness on feet (R26.81);Muscle weakness (generalized) (M62.81)                Time: 4650-3546 OT Time Calculation (min): 34 min Charges:  OT General Charges $OT Visit: 1 Visit OT Evaluation $OT Eval Low Complexity: 1 Low OT Treatments $Self Care/Home Management : 23-37 mins  Darleen Crocker, MS, OTR/L , CBIS ascom [phone]  10/12/20, 1:07 PM

## 2020-10-12 NOTE — Progress Notes (Signed)
Physical Therapy Treatment Patient Details Name: Marissa Nelson MRN: [id number] DOB: [birth year] Today's Date: 10/12/2020    History of Present Illness Patient is a 70 year old female with arthritis of right hip who elected to have THA. Past medical history of anemia, HTN, arthritis, renal disease    PT Comments    Pt ready to walk.  Stood and walked to bathroom in attempt to void then continued to walk an additional 100' in hallway with slow but steady gait.    Pt has not voided since catheter removed this am.  Pt encouraged to drink and let RN know if she remains unable to void.  Abdomen soft and not distended during session.   Follow Up Recommendations  Home health PT     Equipment Recommendations  Rolling walker with 5" wheels;3in1 (PT)    Recommendations for Other Services       Precautions / Restrictions Precautions Precautions: Fall Precaution Booklet Issued: Yes (comment) Precaution Comments: direct anterior hip Restrictions Weight Bearing Restrictions: Yes RLE Weight Bearing: Weight bearing as tolerated    Mobility  Bed Mobility Overal bed mobility: Needs Assistance Bed Mobility: Supine to Sit     Supine to sit: Min guard;Min assist     General bed mobility comments: seated in recliner chair at beginning of session    Transfers Overall transfer level: Needs assistance Equipment used: Rolling walker (2 wheeled) Transfers: Sit to/from Stand Sit to Stand: Min guard;Supervision         General transfer comment: cues for hand placement  Ambulation/Gait Ambulation/Gait assistance: Min guard Gait Distance (Feet): 80 Feet Assistive device: Rolling walker (2 wheeled) Gait Pattern/deviations: Step-to pattern;Decreased step length - right;Decreased step length - left Gait velocity: decreased   General Gait Details: no buckling today   Stairs             Wheelchair Mobility    Modified Rankin (Stroke Patients Only)       Balance  Overall balance assessment: Needs assistance Sitting-balance support: Feet supported Sitting balance-Leahy Scale: Good     Standing balance support: Bilateral upper extremity supported;During functional activity Standing balance-Leahy Scale: Fair Standing balance comment: patient relying on rolling walker for suppor in standing                            Cognition Arousal/Alertness: Awake/alert Behavior During Therapy: WFL for tasks assessed/performed Overall Cognitive Status: Within Functional Limits for tasks assessed                                        Exercises Other Exercises Other Exercises: to bathroom in attempts to void    General Comments        Pertinent Vitals/Pain Pain Assessment: Faces Pain Score: 3  Faces Pain Scale: Hurts a little bit Pain Location: right hip Pain Descriptors / Indicators: Burning Pain Intervention(s): Limited activity within patient's tolerance;Monitored during session;Premedicated before session;Repositioned    Home Living Family/patient expects to be discharged to:: Private residence Living Arrangements: Spouse/significant other Available Help at Discharge: Family;Available PRN/intermittently Type of Home: House Home Access: Stairs to enter Entrance Stairs-Rails: None Home Layout: One level Home Equipment: Cane - single point      Prior Function Level of Independence: Independent      Comments: intermittent use of cane for ambulation, independent otherwise and still works   PT  Goals (current goals can now be found in the care plan section) Acute Rehab PT Goals Patient Stated Goal: to go home Progress towards PT goals: Progressing toward goals    Frequency    BID      PT Plan Current plan remains appropriate    Co-evaluation              AM-PAC PT "6 Clicks" Mobility   Outcome Measure  Help needed turning from your back to your side while in a flat bed without using bedrails?: A  Little Help needed moving from lying on your back to sitting on the side of a flat bed without using bedrails?: A Little Help needed moving to and from a bed to a chair (including a wheelchair)?: A Little Help needed standing up from a chair using your arms (e.g., wheelchair or bedside chair)?: A Little Help needed to walk in hospital room?: A Little Help needed climbing 3-5 steps with a railing? : A Little 6 Click Score: 18    End of Session Equipment Utilized During Treatment: Gait belt Activity Tolerance: Patient tolerated treatment well Patient left: in chair;with call bell/phone within reach;with chair alarm set;with family/visitor present Nurse Communication: Mobility status PT Visit Diagnosis: Unsteadiness on feet (R26.81);Muscle weakness (generalized) (M62.81);Difficulty in walking, not elsewhere classified (R26.2)     Time: 4917-9150 PT Time Calculation (min) (ACUTE ONLY): 25 min  Charges:  $Gait Training: 8-22 mins $Therapeutic Exercise: 8-22 mins                     {Tashya Alberty, PTA 10/12/20, 11:40 AM , 11:37 AM

## 2020-10-12 NOTE — Anesthesia Postprocedure Evaluation (Signed)
Anesthesia Post Note  Patient: Marissa Nelson  Procedure(s) Performed: TOTAL HIP ARTHROPLASTY ANTERIOR APPROACH (Right: Hip)  Patient location during evaluation: Nursing Unit Anesthesia Type: Spinal Level of consciousness: oriented and awake and alert Pain management: pain level controlled Vital Signs Assessment: post-procedure vital signs reviewed and stable Respiratory status: spontaneous breathing Cardiovascular status: blood pressure returned to baseline and stable Postop Assessment: no headache, no backache, no apparent nausea or vomiting and patient able to bend at knees Anesthetic complications: no   No notable events documented.   Last Vitals:  Vitals:   10/12/20 0445 10/12/20 0800  BP: 134/69 (!) 153/69  Pulse: 75 65  Resp: 18 16  Temp: 36.8 C 36.6 C  SpO2: 100% 100%    Last Pain:  Vitals:   10/12/20 0905  TempSrc:   PainSc: 4                  Precious Haws Keghan Mcfarren

## 2020-10-12 NOTE — Plan of Care (Signed)
  Problem: Activity: Goal: Ability to avoid complications of mobility impairment will improve Outcome: Progressing Goal: Ability to tolerate increased activity will improve Outcome: Progressing   Problem: Clinical Measurements: Goal: Postoperative complications will be avoided or minimized Outcome: Progressing   Problem: Skin Integrity: Goal: Will show signs of wound healing Outcome: Progressing   Problem: Clinical Measurements: Goal: Will remain free from infection Outcome: Progressing   Problem: Clinical Measurements: Goal: Will remain free from infection Outcome: Progressing   Problem: Activity: Goal: Risk for activity intolerance will decrease Outcome: Progressing

## 2020-10-12 NOTE — Progress Notes (Signed)
Subjective: 1 Day Post-Op Procedure(s) (LRB): TOTAL HIP ARTHROPLASTY ANTERIOR APPROACH (Right) Patient reports pain as mild.   Patient is well, and has had no acute complaints or problems Denies any CP, SOB, ABD pain. We will continue therapy today.  Plan is to go Home after hospital stay.  Objective: Vital signs in last 24 hours: Temp:  [93.7 F (34.3 C)-98.5 F (36.9 C)] 98.2 F (36.8 C) (06/10 0445) Pulse Rate:  [60-88] 75 (06/10 0445) Resp:  [13-29] 18 (06/10 0445) BP: (95-134)/(55-82) 134/69 (06/10 0445) SpO2:  [93 %-100 %] 100 % (06/10 0445) Weight:  [80.3 kg] 80.3 kg (06/09 0820)  Intake/Output from previous day: 06/09 0701 - 06/10 0700 In: 2282.2 [P.O.:120; I.V.:2012.2; IV Piggyback:150] Out: 1500 [Urine:1300; Blood:200] Intake/Output this shift: No intake/output data recorded.  Recent Labs    10/11/20 1509 10/12/20 0427  HGB 11.0* 10.8*   Recent Labs    10/11/20 1509 10/12/20 0427  WBC 10.1 8.9  RBC 3.48* 3.40*  HCT 32.2* 30.7*  PLT 208 201   Recent Labs    10/11/20 1509 10/12/20 0427  NA  --  138  K  --  4.3  CL  --  109  CO2  --  23  BUN  --  22  CREATININE 1.19* 1.40*  GLUCOSE  --  135*  CALCIUM  --  9.1   No results for input(s): LABPT, INR in the last 72 hours.  EXAM General - Patient is Alert, Appropriate, and Oriented Extremity - ABD soft Neurovascular intact Sensation intact distally Intact pulses distally Dorsiflexion/Plantar flexion intact No cellulitis present Compartment soft Dressing - dressing C/D/I and no drainage. Provena intact with out drainage Motor Function - intact, moving foot and toes well on exam.    Past Medical History:  Diagnosis Date   Abnormal mammogram of right breast 01/17/2016   Recommendation: Six month follow-up diagnostic mammogram of the right breast for an additional group of probably benign Calcifications. 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   RIGHT, August 2018   Family history of diabetes mellitus 06/12/2016   Mother and several other relatives   HLD (hyperlipidemia) 06/15/2015   Hx of right breast biopsy 01/17/2016   Hyperlipidemia    Hypertension    Medication monitoring encounter 11/15/2015   Motion sickness    ocean ships   Numbness and tingling in left hand 11/07/2014   Obesity 10/25/2015   Obesity (BMI 30.0-34.9) 10/25/2015   Personal history of radiation therapy 2018   right breast ca   PONV (postoperative nausea and vomiting)    patient stated she vomits a lot after anesthsia   Positive ANA (antinuclear antibody) 10/04/2016   Prediabetes    Preventative health care 11/15/2015   Vitamin D deficiency 10/29/2015    Assessment/Plan:   1 Day Post-Op Procedure(s) (LRB): TOTAL HIP ARTHROPLASTY ANTERIOR APPROACH (Right) Active Problems:   S/P hip replacement  Estimated body mass index is 32.37 kg/m as calculated from the following:   Height as of this encounter: 5\' 2"  (1.575 m).   Weight as of this encounter: 80.3 kg. Advance diet Up with therapy Work on BM Pain well  controlled Labs and VSS CM to assist with discharge to home with HHPT  DVT Prophylaxis - Lovenox, TED hose, and SCD Weight-Bearing as tolerated to right leg   T. Rachelle Hora, PA-C [place] 10/12/2020, 7:57 AM

## 2020-10-12 NOTE — TOC Initial Note (Addendum)
Transition of Care [facility]) - Initial/Assessment Note    Patient Details  Name: Marissa Nelson MRN: [id number] Date of Birth: 1951/04/14  [facility]) CM/SW Contact:    Candie Chroman, LCSW Phone Number: 10/12/2020, 12:22 PM  Clinical Narrative:   CSW met with patient. Husband at bedside. CSW introduced role and explained that PT recommendations would be discussed. Patient was set up with [facility] for home health prior to admission and is agreeable to this. PT recommending RW and 3-in-1. Patient is agreeable. Ordered through [employer]. Per patient, plan to discharge tomorrow or Sunday.               Expected Discharge Plan: [place] Barriers to Discharge: Continued Medical Work up   Patient Goals and CMS Choice     Choice offered to / list presented to : Patient, Spouse  Expected Discharge Plan and Services Expected Discharge Plan: [place] Acute Care Choice: Durable Medical Equipment, Home Health Living arrangements for the past 2 months: Single Family Home                                      Prior Living Arrangements/Services Living arrangements for the past 2 months: Single Family Home Lives with:: Spouse Patient language and need for interpreter reviewed:: Yes Do you feel safe going back to the place where you live?: Yes      Need for Family Participation in Patient Care: Yes (Comment) Care giver support system in place?: Yes (comment)   Criminal Activity/Legal Involvement Pertinent to Current Situation/Hospitalization: No - Comment as needed  Activities of Daily Living Home Assistive Devices/Equipment: Cane (specify quad or straight) ADL Screening (condition at time of admission) Patient's cognitive ability adequate to safely complete daily activities?: Yes Is the patient deaf or have difficulty hearing?: No Does the patient have difficulty seeing, even when wearing glasses/contacts?:  No Does the patient have difficulty concentrating, remembering, or making decisions?: No Patient able to express need for assistance with ADLs?: Yes Does the patient have difficulty dressing or bathing?: No Independently performs ADLs?: Yes (appropriate for developmental age) Does the patient have difficulty walking or climbing stairs?: No Weakness of Legs: None Weakness of Arms/Hands: None  Permission Sought/Granted Permission sought to share information with : Facility Sport and exercise psychologist, Family Supports Permission granted to share information with : Yes, Verbal Permission Granted  Share Information with NAME: Marissa Nelson  Permission granted to share info w AGENCY: [place] granted to share info w Relationship: Husband  Permission granted to share info w Contact Information: [phone]  Emotional Assessment Appearance:: Appears stated age Attitude/Demeanor/Rapport: Engaged, Gracious Affect (typically observed): Accepting, Appropriate, Calm, Pleasant Orientation: : Oriented to Self, Oriented to Place, Oriented to  Time, Oriented to Situation Alcohol / Substance Use: Not Applicable Psych Involvement: No (comment)  Admission diagnosis:  S/P hip replacement [Z96.649] Patient Active Problem List   Diagnosis Date Noted   S/P hip replacement 10/11/2020   Hyperuricemia 08/25/2019   Anemia in chronic kidney disease 05/25/2019   Benign hypertensive kidney disease with chronic kidney disease 05/25/2019   Vitamin B12 deficiency 05/07/2017   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   Arthralgia 10/04/2016   Prediabetes 06/20/2016   Chronic kidney disease, stage III (moderate) (HCC) 11/15/2015   Vitamin D deficiency 10/29/2015   Class  2 severe obesity with serious comorbidity and body mass index (BMI) of 35.0 to 35.9 in adult ([place]) 10/25/2015   HLD (hyperlipidemia) 06/15/2015   Chronic left shoulder pain 11/08/2014   Numbness and tingling in  left hand 11/07/2014   Benign essential HTN 12/07/2006   PCP:  Delsa Grana, PA-C Pharmacy:   [facility] [address], [place] - [place] [address] [place] [address] Phone: [phone] Fax: [place], [place] [address] Phone: [phone] Fax: [phone]     Social Determinants of Health (SDOH) Interventions    Readmission Risk Interventions No flowsheet data found.

## 2020-10-13 ENCOUNTER — Encounter: Payer: Self-pay | Admitting: Orthopedic Surgery

## 2020-10-13 LAB — CBC
HCT: 29 % — ABNORMAL LOW (ref 36.0–46.0)
Hemoglobin: 10.2 g/dL — ABNORMAL LOW (ref 12.0–15.0)
MCH: 32 pg (ref 26.0–34.0)
MCHC: 35.2 g/dL (ref 30.0–36.0)
MCV: 90.9 fL (ref 80.0–100.0)
Platelets: 174 10*3/uL (ref 150–400)
RBC: 3.19 MIL/uL — ABNORMAL LOW (ref 3.87–5.11)
RDW: 13.5 % (ref 11.5–15.5)
WBC: 7.8 10*3/uL (ref 4.0–10.5)
nRBC: 0 % (ref 0.0–0.2)

## 2020-10-13 MED ORDER — BISACODYL 10 MG RE SUPP: 10.0000 mg | Freq: Once | RECTAL | Status: DC

## 2020-10-13 MED ORDER — ANASTROZOLE 1 MG PO TABS: 1.0000 mg | ORAL_TABLET | Freq: Every morning | ORAL | Status: DC

## 2020-10-13 MED ORDER — DOCUSATE SODIUM 100 MG PO CAPS: 100.0000 mg | ORAL_CAPSULE | Freq: Two times a day (BID) | ORAL | 0 refills | Status: DC

## 2020-10-13 MED ORDER — METHOCARBAMOL 500 MG PO TABS: 500.0000 mg | ORAL_TABLET | Freq: Four times a day (QID) | ORAL | 0 refills | Status: DC | PRN

## 2020-10-13 MED ORDER — MAGNESIUM HYDROXIDE 400 MG/5ML PO SUSP
30.0000 mL | Freq: Once | ORAL | Status: AC
  Administered 2020-10-13: 30 mL via ORAL
  Filled 2020-10-13: qty 30

## 2020-10-13 MED ORDER — TRAMADOL HCL 50 MG PO TABS: 50.0000 mg | ORAL_TABLET | Freq: Four times a day (QID) | ORAL | 0 refills | Status: DC | PRN

## 2020-10-13 MED ORDER — ENOXAPARIN SODIUM 40 MG/0.4ML IJ SOSY: 40.0000 mg | PREFILLED_SYRINGE | INTRAMUSCULAR | 0 refills | Status: DC

## 2020-10-13 MED ORDER — HYDROCODONE-ACETAMINOPHEN 5-325 MG PO TABS: 1.0000 | ORAL_TABLET | ORAL | 0 refills | Status: DC | PRN

## 2020-10-13 NOTE — Progress Notes (Signed)
Physical Therapy Treatment Patient Details Name: Marissa Nelson MRN: [id number] DOB: October 05, 1950 Today's Date: 10/13/2020    History of Present Illness Patient is a 70 year old female with arthritis of right hip who elected to have THA. Past medical history of anemia, HTN, arthritis, renal disease    PT Comments    Pt did well with PT session.  She showed some initial pain hesitancy and stiffness, but was able to increase tolerance with increased activity. She was slow and somewhat hesitant with mobility but ultimately did not need direct assist with any aspect of this.  She was able to ambulate >100 ft, negotiated up/down steps and though she had some stiffness/complaints with exercises she ultimately showed good effort and did well with them.       Follow Up Recommendations  Home health PT     Equipment Recommendations  Rolling walker with 5" wheels;3in1 (PT)    Recommendations for Other Services       Precautions / Restrictions Precautions Precautions: Fall Precaution Booklet Issued: Yes (comment) Precaution Comments: direct anterior hip Restrictions RLE Weight Bearing: Weight bearing as tolerated    Mobility  Bed Mobility Overal bed mobility: Needs Assistance Bed Mobility: Supine to Sit     Supine to sit: Min guard     General bed mobility comments: Pt with slow, labored effort to get to EOB, but ultimately was able to attain sitting w/o direct assist    Transfers Overall transfer level: Needs assistance Equipment used: Rolling walker (2 wheeled) Transfers: Sit to/from Stand Sit to Stand: Supervision         General transfer comment: cues for hand placement and sequencing, no direct assist from bed or recliner  Ambulation/Gait Ambulation/Gait assistance: Min guard Gait Distance (Feet): 125 Feet Assistive device: Rolling walker (2 wheeled)       General Gait Details: Pt with some initial expected hesitancy, but with increased distance showed  increased confidence.  Able to attain/maintain consistent cadence for final half of the effort.   Stairs Stairs: Yes Stairs assistance: Min guard Stair Management: No rails Number of Stairs: 4 General stair comments: Pt was able to negotiate single step with walker to simulate entering home.  Also trial of up/down 4 steps with b/l hand rails.  Pt able to effectively perform both with cuing but no direct assist, familiy present and educated on technique as well.   Wheelchair Mobility    Modified Rankin (Stroke Patients Only)       Balance Overall balance assessment: Modified Independent   Sitting balance-Leahy Scale: Good       Standing balance-Leahy Scale: Good Standing balance comment: reliant on walker, but no LOBs, stagger stepping, etc                            Cognition Arousal/Alertness: Awake/alert Behavior During Therapy: WFL for tasks assessed/performed Overall Cognitive Status: Within Functional Limits for tasks assessed                                        Exercises Total Joint Exercises Ankle Circles/Pumps: AROM;10 reps Short Arc Quad: AROM;10 reps Heel Slides: Strengthening;10 reps;AROM (resisted leg ext, AROM w/o resist for heel slides) Hip ABduction/ADduction: AROM;10 reps Straight Leg Raises: AAROM;5 reps    General Comments        Pertinent Vitals/Pain Pain Assessment: 0-10 Pain  Score: 5  Pain Location: initially slow to loosen up R hip, pain/stiffness decreases with inceased movement    Home Living                      Prior Function            PT Goals (current goals can now be found in the care plan section) Progress towards PT goals: Progressing toward goals    Frequency    BID      PT Plan Current plan remains appropriate    Co-evaluation              AM-PAC PT "6 Clicks" Mobility   Outcome Measure  Help needed turning from your back to your side while in a flat bed without  using bedrails?: A Little Help needed moving from lying on your back to sitting on the side of a flat bed without using bedrails?: A Little Help needed moving to and from a bed to a chair (including a wheelchair)?: A Little Help needed standing up from a chair using your arms (e.g., wheelchair or bedside chair)?: A Little Help needed to walk in hospital room?: A Little Help needed climbing 3-5 steps with a railing? : A Little 6 Click Score: 18    End of Session Equipment Utilized During Treatment: Gait belt Activity Tolerance: Patient tolerated treatment well Patient left: in chair;with call bell/phone within reach;with chair alarm set;with family/visitor present Nurse Communication: Mobility status PT Visit Diagnosis: Unsteadiness on feet (R26.81);Muscle weakness (generalized) (M62.81);Difficulty in walking, not elsewhere classified (R26.2)     Time: 8315-1761 PT Time Calculation (min) (ACUTE ONLY): 32 min  Charges:  $Gait Training: 8-22 mins $Therapeutic Exercise: 8-22 mins                     Kreg Shropshire, DPT 10/13/2020, 10:33 AM

## 2020-10-13 NOTE — TOC Transition Note (Signed)
Transition of Care [facility]) - CM/SW Discharge Note   Patient Details  Name: Marissa Nelson MRN: [id number] Date of Birth: 07-29-1950  [facility]) CM/SW Contact:  Candie Chroman, LCSW Phone Number: 10/13/2020, 10:35 AM   Clinical Narrative:   Patient has orders to discharge home today. Left message for [facility] representative to notify. DME delivered to the room yesterday. No further concerns. CSW signing off.  Final next level of care: Home w Home Health Services Barriers to Discharge: Barriers Resolved   Patient Goals and CMS Choice     Choice offered to / list presented to : Patient, Spouse  Discharge Placement                    Patient and family notified of of transfer: 10/13/20  Discharge Plan and Services     Post Acute Care Choice: Durable Medical Equipment, Home Health          DME Arranged: 3-N-1, Walker rolling DME Agency: [employer] Date DME Agency Contacted: 10/12/20   Representative spoke with at DME Agency: Suanne Marker HH Arranged: PT, OT, Nurse's Aide [place] Agency: [place] Date [place]: 10/13/20   Representative spoke with at [place]: [place] [employer]  Social Determinants of Health ([place]) Interventions     Readmission Risk Interventions No flowsheet data found.

## 2020-10-13 NOTE — Discharge Summary (Addendum)
Physician Discharge Summary  Patient ID: Marissa Nelson MRN: [id number] DOB/AGE: [birth year] 70 y.o.  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   Arthralgia 10/04/2016   Prediabetes 06/20/2016   Chronic kidney disease, stage III (moderate) ([place]) 11/15/2015   Vitamin D deficiency 10/29/2015   Class 2 severe obesity with serious comorbidity and body mass index (BMI) of 35.0 to 35.9 in adult ([place]) 10/25/2015   HLD (hyperlipidemia) 06/15/2015   Chronic left shoulder pain 11/08/2014   Numbness and tingling in left hand 11/07/2014   Benign essential HTN 12/07/2006    Past Medical History:  Diagnosis Date   Abnormal mammogram of right breast 01/17/2016   Recommendation: Six month follow-up diagnostic mammogram of the right breast for an additional group of probably benign Calcifications. 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   RIGHT, August 2018   Family history of diabetes mellitus 06/12/2016   Mother and several other relatives   HLD (hyperlipidemia) 06/15/2015   Hx of right breast biopsy 01/17/2016   Hyperlipidemia    Hypertension    Medication monitoring encounter 11/15/2015   Motion sickness    ocean ships   Numbness and tingling in left hand 11/07/2014   Obesity 10/25/2015    Obesity (BMI 30.0-34.9) 10/25/2015   Personal history of radiation therapy 2018   right breast ca   PONV (postoperative nausea and vomiting)    patient stated she vomits a lot after anesthsia   Positive ANA (antinuclear antibody) 10/04/2016   Prediabetes    Preventative health care 11/15/2015   Vitamin D deficiency 10/29/2015     Transfusion: none   Consultants (if any):   Discharged Condition: Improved  Hospital Course: Marissa Nelson is an 70 y.o. female who was admitted 10/11/2020 with a diagnosis of right hip osteoarthritis and went to the operating room on 10/11/2020 and underwent the above named procedures.    Surgeries: Procedure(s): TOTAL HIP ARTHROPLASTY ANTERIOR APPROACH on 10/11/2020 Patient tolerated the surgery well. Taken to PACU where she was stabilized and then transferred to the orthopedic floor.  Started on Lovenox 40 mg q 24 hrs. Foot pumps applied bilaterally at 80 mm. Heels elevated on bed with rolled towels. No evidence of DVT. Negative [place]. Physical therapy started on day #1 for gait training and transfer. OT started day #1 for ADL and assisted devices.  Patient's foley was d/c on day #1. Patient's IV was d/c on day #2.  On post op day #2 patient was stable and ready for discharge to home with HHPT.   She was given perioperative antibiotics:  Anti-infectives (From admission, onward)    Start     Dose/Rate Route Frequency Ordered Stop   10/11/20 1600  clindamycin (CLEOCIN) IVPB 600 mg  600 mg 100 mL/hr over 30 Minutes Intravenous Every 6 hours 10/11/20 1428 10/11/20 2217   10/11/20 0905  clindamycin (CLEOCIN) 900 MG/50ML IVPB       Note to Pharmacy: Sylvester Harder   : cabinet override      10/11/20 0905 10/11/20 0958   10/11/20 0600  clindamycin (CLEOCIN) IVPB 900 mg        900 mg 100 mL/hr over 30 Minutes Intravenous On call to O.R. 10/11/20 0251 10/11/20 1007     .  She was given sequential compression devices, early ambulation, and Lovenox TEDs  for DVT prophylaxis.  She benefited maximally from the hospital stay and there were no complications.    Recent vital signs:  Vitals:   10/13/20 0506 10/13/20 0733  BP: (!) 144/72 127/60  Pulse: 84 82  Resp: 18 18  Temp: 100.2 F (37.9 C) 98.9 F (37.2 C)  SpO2: 100% 98%    Recent laboratory studies:  Lab Results  Component Value Date   HGB 10.2 (L) 10/13/2020   HGB 10.8 (L) 10/12/2020   HGB 11.0 (L) 10/11/2020   Lab Results  Component Value Date   WBC 7.8 10/13/2020   PLT 174 10/13/2020   No results found for: INR Lab Results  Component Value Date   NA 138 10/12/2020   K 4.3 10/12/2020   CL 109 10/12/2020   CO2 23 10/12/2020   BUN 22 10/12/2020   CREATININE 1.40 (H) 10/12/2020   GLUCOSE 135 (H) 10/12/2020    Discharge Medications:   Allergies as of 10/13/2020       Reactions   Penicillins Anaphylaxis   Shellfish Allergy Swelling   angioedema        Medication List     TAKE these medications    acetaminophen 325 MG tablet Commonly known as: TYLENOL Take 650 mg by mouth every 6 (six) hours as needed for mild pain or headache.   albuterol 108 (90 Base) MCG/ACT inhaler Commonly known as: VENTOLIN HFA Inhale 2-4 puffs by mouth every 4 hours as needed for wheezing, cough, and/or shortness of breath   anastrozole 1 MG tablet Commonly known as: ARIMIDEX Take 1 tablet (1 mg total) by mouth in the morning.   atorvastatin 20 MG tablet Commonly known as: LIPITOR Take 1 tablet (20 mg total) by mouth at bedtime.   calcium carbonate 600 MG Tabs tablet Commonly known as: OS-CAL Take 600 mg by mouth daily with breakfast.   docusate sodium 100 MG capsule Commonly known as: COLACE Take 1 capsule (100 mg total) by mouth 2 (two) times daily.   enoxaparin 40 MG/0.4ML injection Commonly known as: LOVENOX Inject 0.4 mLs (40 mg total) into the skin daily for 14 days. Start taking on: October 14, 2020   HYDROcodone-acetaminophen 5-325 MG tablet Commonly known  as: NORCO/VICODIN Take 1-2 tablets by mouth every 4 (four) hours as needed for moderate pain (pain score 4-6).   latanoprost 0.005 % ophthalmic solution Commonly known as: XALATAN Place 1 drop into both eyes at bedtime.   methocarbamol 500 MG tablet Commonly known as: ROBAXIN Take 1 tablet (500 mg total) by mouth every 6 (six) hours as needed for muscle spasms.   torsemide 10 MG tablet Commonly known as: DEMADEX Take 10 mg by mouth daily as needed (fluid).   traMADol 50 MG tablet Commonly known as: ULTRAM Take 1 tablet (50 mg total) by mouth every 6 (six) hours as needed.   triamterene-hydrochlorothiazide 37.5-25 MG tablet Commonly known as: [employer]  Take 1 tablet by mouth daily.   Vitamin B-12 500 MCG Subl Place 500 mcg under the tongue daily.   vitamin C 1000 MG tablet Take 1,000 mg by mouth in the morning.   Vitamin D3 50 MCG (2000 UT) Tabs Take 2,000 Units by mouth daily.       ASK your doctor about these medications    metoprolol succinate 25 MG 24 hr tablet Commonly known as: TOPROL-XL Take 1 tablet (25 mg total) by mouth daily.               Durable Medical Equipment  (From admission, onward)           Start     Ordered   10/11/20 1429  DME Walker rolling  Once       Question Answer Comment  Walker: With 5 Inch Wheels   Patient needs a walker to treat with the following condition S/P hip replacement      10/11/20 1428   10/11/20 1429  DME 3 n 1  Once        10/11/20 1428   10/11/20 1429  DME Bedside commode  Once       Question:  Patient needs a bedside commode to treat with the following condition  Answer:  S/P hip replacement   10/11/20 1428            Diagnostic Studies: DG HIP OPERATIVE UNILAT W OR W/O PELVIS RIGHT  Result Date: 10/11/2020 CLINICAL DATA:  Total hip replacement EXAM: OPERATIVE RIGHT HIP   1 VIEW TECHNIQUE: Fluoroscopic spot image(s) were submitted for interpretation post-operatively. COMPARISON:  MR pelvis and  right hip May 14, 2020. FLUOROSCOPY TIME:  0 minutes 12 seconds; 4.2 mGy; 2 acquired images FINDINGS: Frontal view obtained. Total hip replacement right with prosthetic components well-seated on frontal view. No fracture or dislocation. IMPRESSION: Total hip replacement right with prosthetic components well-seated on frontal view. No fracture or dislocation. Electronically Signed   By: Lowella Grip III M.D.   On: 10/11/2020 13:51   DG HIP UNILAT W OR W/O PELVIS 2-3 VIEWS RIGHT  Result Date: 10/11/2020 CLINICAL DATA:  Right total hip replacement. EXAM: DG HIP (WITH OR WITHOUT PELVIS) 2-3V RIGHT COMPARISON:  Intraoperative films performed same day. FINDINGS: AP and cross-table lateral views of the right hip show the patient to be status post total hip replacement. Lateral view is limited, but no evidence for immediate hardware complication. Gas in the overlying soft tissues is compatible with the immediate postoperative state. Catheter tubing overlying the soft tissues is compatible with a drain. IMPRESSION: Status post right total hip replacement. No evidence for immediate hardware complication. Electronically Signed   By: Misty Stanley M.D.   On: 10/11/2020 12:07    Disposition:      Follow-up Information     Duanne Guess, PA-C Follow up in 2 week(s).   Specialties: Orthopedic Surgery, Emergency Medicine Contact information: [place] [address] [phone]                  Signed: Feliberto Gottron 10/13/2020, 8:08 AM

## 2020-10-13 NOTE — Discharge Instructions (Signed)

## 2020-10-13 NOTE — Plan of Care (Signed)
  Problem: Activity: Goal: Ability to avoid complications of mobility impairment will improve Outcome: Progressing Goal: Ability to tolerate increased activity will improve Outcome: Progressing

## 2020-10-13 NOTE — Progress Notes (Signed)
Patient A & O and able to make needs known. AVS printed and reviewed with patient who verbalized understanding via teach back re medications, follow up appointments, signs and symptoms to notify MD as well as limitations and restrictions. Printed prescription given to patient for docusate sodium, enoxaparin, hydrocodone-acetaminophen, methocarbamol and tramadol. Patient educated on how to give enoxaparin and was able to return appropriate demonstration. Patient spouse will transport home via private vehicle.

## 2020-10-13 NOTE — Progress Notes (Signed)
Subjective: 2 Days Post-Op Procedure(s) (LRB): TOTAL HIP ARTHROPLASTY ANTERIOR APPROACH (Right) Patient reports pain as mild.   Patient is well, and has had no acute complaints or problems Denies any CP, SOB, ABD pain. We will continue therapy today.  Plan is to go Home after hospital stay.  Objective: Vital signs in last 24 hours: Temp:  [98.3 F (36.8 C)-100.2 F (37.9 C)] 98.9 F (37.2 C) (06/11 0733) Pulse Rate:  [59-84] 82 (06/11 0733) Resp:  [18-20] 18 (06/11 0733) BP: (127-151)/(60-78) 127/60 (06/11 0733) SpO2:  [98 %-100 %] 98 % (06/11 0733)  Intake/Output from previous day: 06/10 0701 - 06/11 0700 In: 50 [P.O.:50] Out: 400 [Urine:400] Intake/Output this shift: No intake/output data recorded.  Recent Labs    10/11/20 1509 10/12/20 0427 10/13/20 0459  HGB 11.0* 10.8* 10.2*   Recent Labs    10/12/20 0427 10/13/20 0459  WBC 8.9 7.8  RBC 3.40* 3.19*  HCT 30.7* 29.0*  PLT 201 174   Recent Labs    10/11/20 1509 10/12/20 0427  NA  --  138  K  --  4.3  CL  --  109  CO2  --  23  BUN  --  22  CREATININE 1.19* 1.40*  GLUCOSE  --  135*  CALCIUM  --  9.1   No results for input(s): LABPT, INR in the last 72 hours.  EXAM General - Patient is Alert, Appropriate, and Oriented Extremity - ABD soft Neurovascular intact Sensation intact distally Intact pulses distally Dorsiflexion/Plantar flexion intact No cellulitis present Compartment soft Dressing - dressing C/D/I and no drainage. Provena intact with out drainage Motor Function - intact, moving foot and toes well on exam.    Past Medical History:  Diagnosis Date   Abnormal mammogram of right breast 01/17/2016   Recommendation: Six month follow-up diagnostic mammogram of the right breast for an additional group of probably benign Calcifications. 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   RIGHT, August 2018   Family history of diabetes mellitus 06/12/2016   Mother and several other relatives   HLD (hyperlipidemia) 06/15/2015   Hx of right breast biopsy 01/17/2016   Hyperlipidemia    Hypertension    Medication monitoring encounter 11/15/2015   Motion sickness    [place] ships   Numbness and tingling in left hand 11/07/2014   Obesity 10/25/2015   Obesity (BMI 30.0-34.9) 10/25/2015   Personal history of radiation therapy 2018   right breast ca   PONV (postoperative nausea and vomiting)    patient stated she vomits a lot after anesthsia   Positive ANA (antinuclear antibody) 10/04/2016   Prediabetes    Preventative health care 11/15/2015   Vitamin D deficiency 10/29/2015    Assessment/Plan:   2 Days Post-Op Procedure(s) (LRB): TOTAL HIP ARTHROPLASTY ANTERIOR APPROACH (Right) Active Problems:   S/P hip replacement  Estimated body mass index is 32.37 kg/m as calculated from the following:   Height as of this encounter: 5\' 2"  (1.575 m).   Weight as of this encounter: 80.3 kg. Advance diet Up with therapy Work on BM Pain well controlled Labs and VSS CM to assist with discharge  to home with HHPT Sunday  DVT Prophylaxis - Lovenox, TED hose, and SCD Weight-Bearing as tolerated to right leg   T. Rachelle Hora, PA-C [place] 10/13/2020, 8:02 AM

## 2020-10-14 DIAGNOSIS — M19042 Primary osteoarthritis, left hand: Secondary | ICD-10-CM | POA: Diagnosis not present

## 2020-10-14 DIAGNOSIS — D631 Anemia in chronic kidney disease: Secondary | ICD-10-CM | POA: Diagnosis not present

## 2020-10-14 DIAGNOSIS — E538 Deficiency of other specified B group vitamins: Secondary | ICD-10-CM | POA: Diagnosis not present

## 2020-10-14 DIAGNOSIS — I129 Hypertensive chronic kidney disease with stage 1 through stage 4 chronic kidney disease, or unspecified chronic kidney disease: Secondary | ICD-10-CM | POA: Diagnosis not present

## 2020-10-14 DIAGNOSIS — E785 Hyperlipidemia, unspecified: Secondary | ICD-10-CM | POA: Diagnosis not present

## 2020-10-14 DIAGNOSIS — G4733 Obstructive sleep apnea (adult) (pediatric): Secondary | ICD-10-CM | POA: Diagnosis not present

## 2020-10-14 DIAGNOSIS — Z471 Aftercare following joint replacement surgery: Secondary | ICD-10-CM | POA: Diagnosis not present

## 2020-10-14 DIAGNOSIS — N183 Chronic kidney disease, stage 3 unspecified: Secondary | ICD-10-CM | POA: Diagnosis not present

## 2020-10-14 DIAGNOSIS — R7303 Prediabetes: Secondary | ICD-10-CM | POA: Diagnosis not present

## 2020-10-15 LAB — SURGICAL PATHOLOGY

## 2020-10-18 DIAGNOSIS — G4733 Obstructive sleep apnea (adult) (pediatric): Secondary | ICD-10-CM | POA: Diagnosis not present

## 2020-10-18 DIAGNOSIS — D631 Anemia in chronic kidney disease: Secondary | ICD-10-CM | POA: Diagnosis not present

## 2020-10-18 DIAGNOSIS — N183 Chronic kidney disease, stage 3 unspecified: Secondary | ICD-10-CM | POA: Diagnosis not present

## 2020-10-18 DIAGNOSIS — I129 Hypertensive chronic kidney disease with stage 1 through stage 4 chronic kidney disease, or unspecified chronic kidney disease: Secondary | ICD-10-CM | POA: Diagnosis not present

## 2020-10-18 DIAGNOSIS — E785 Hyperlipidemia, unspecified: Secondary | ICD-10-CM | POA: Diagnosis not present

## 2020-10-18 DIAGNOSIS — R7303 Prediabetes: Secondary | ICD-10-CM | POA: Diagnosis not present

## 2020-10-18 DIAGNOSIS — E538 Deficiency of other specified B group vitamins: Secondary | ICD-10-CM | POA: Diagnosis not present

## 2020-10-18 DIAGNOSIS — Z471 Aftercare following joint replacement surgery: Secondary | ICD-10-CM | POA: Diagnosis not present

## 2020-10-18 DIAGNOSIS — M19042 Primary osteoarthritis, left hand: Secondary | ICD-10-CM | POA: Diagnosis not present

## 2020-10-19 DIAGNOSIS — N183 Chronic kidney disease, stage 3 unspecified: Secondary | ICD-10-CM | POA: Diagnosis not present

## 2020-10-19 DIAGNOSIS — I129 Hypertensive chronic kidney disease with stage 1 through stage 4 chronic kidney disease, or unspecified chronic kidney disease: Secondary | ICD-10-CM | POA: Diagnosis not present

## 2020-10-19 DIAGNOSIS — Z471 Aftercare following joint replacement surgery: Secondary | ICD-10-CM | POA: Diagnosis not present

## 2020-10-19 DIAGNOSIS — G4733 Obstructive sleep apnea (adult) (pediatric): Secondary | ICD-10-CM | POA: Diagnosis not present

## 2020-10-19 DIAGNOSIS — R7303 Prediabetes: Secondary | ICD-10-CM | POA: Diagnosis not present

## 2020-10-19 DIAGNOSIS — E785 Hyperlipidemia, unspecified: Secondary | ICD-10-CM | POA: Diagnosis not present

## 2020-10-19 DIAGNOSIS — M19042 Primary osteoarthritis, left hand: Secondary | ICD-10-CM | POA: Diagnosis not present

## 2020-10-19 DIAGNOSIS — D631 Anemia in chronic kidney disease: Secondary | ICD-10-CM | POA: Diagnosis not present

## 2020-10-19 DIAGNOSIS — E538 Deficiency of other specified B group vitamins: Secondary | ICD-10-CM | POA: Diagnosis not present

## 2020-10-22 DIAGNOSIS — E785 Hyperlipidemia, unspecified: Secondary | ICD-10-CM | POA: Diagnosis not present

## 2020-10-22 DIAGNOSIS — I129 Hypertensive chronic kidney disease with stage 1 through stage 4 chronic kidney disease, or unspecified chronic kidney disease: Secondary | ICD-10-CM | POA: Diagnosis not present

## 2020-10-22 DIAGNOSIS — R7303 Prediabetes: Secondary | ICD-10-CM | POA: Diagnosis not present

## 2020-10-22 DIAGNOSIS — Z471 Aftercare following joint replacement surgery: Secondary | ICD-10-CM | POA: Diagnosis not present

## 2020-10-22 DIAGNOSIS — E538 Deficiency of other specified B group vitamins: Secondary | ICD-10-CM | POA: Diagnosis not present

## 2020-10-22 DIAGNOSIS — M19042 Primary osteoarthritis, left hand: Secondary | ICD-10-CM | POA: Diagnosis not present

## 2020-10-22 DIAGNOSIS — G4733 Obstructive sleep apnea (adult) (pediatric): Secondary | ICD-10-CM | POA: Diagnosis not present

## 2020-10-22 DIAGNOSIS — D631 Anemia in chronic kidney disease: Secondary | ICD-10-CM | POA: Diagnosis not present

## 2020-10-22 DIAGNOSIS — N183 Chronic kidney disease, stage 3 unspecified: Secondary | ICD-10-CM | POA: Diagnosis not present

## 2020-10-24 DIAGNOSIS — D631 Anemia in chronic kidney disease: Secondary | ICD-10-CM | POA: Diagnosis not present

## 2020-10-24 DIAGNOSIS — N183 Chronic kidney disease, stage 3 unspecified: Secondary | ICD-10-CM | POA: Diagnosis not present

## 2020-10-24 DIAGNOSIS — E785 Hyperlipidemia, unspecified: Secondary | ICD-10-CM | POA: Diagnosis not present

## 2020-10-24 DIAGNOSIS — G4733 Obstructive sleep apnea (adult) (pediatric): Secondary | ICD-10-CM | POA: Diagnosis not present

## 2020-10-24 DIAGNOSIS — R7303 Prediabetes: Secondary | ICD-10-CM | POA: Diagnosis not present

## 2020-10-24 DIAGNOSIS — M19042 Primary osteoarthritis, left hand: Secondary | ICD-10-CM | POA: Diagnosis not present

## 2020-10-24 DIAGNOSIS — E538 Deficiency of other specified B group vitamins: Secondary | ICD-10-CM | POA: Diagnosis not present

## 2020-10-24 DIAGNOSIS — Z471 Aftercare following joint replacement surgery: Secondary | ICD-10-CM | POA: Diagnosis not present

## 2020-10-24 DIAGNOSIS — I129 Hypertensive chronic kidney disease with stage 1 through stage 4 chronic kidney disease, or unspecified chronic kidney disease: Secondary | ICD-10-CM | POA: Diagnosis not present

## 2020-10-26 DIAGNOSIS — E785 Hyperlipidemia, unspecified: Secondary | ICD-10-CM | POA: Diagnosis not present

## 2020-10-26 DIAGNOSIS — G4733 Obstructive sleep apnea (adult) (pediatric): Secondary | ICD-10-CM | POA: Diagnosis not present

## 2020-10-26 DIAGNOSIS — M19042 Primary osteoarthritis, left hand: Secondary | ICD-10-CM | POA: Diagnosis not present

## 2020-10-26 DIAGNOSIS — R7303 Prediabetes: Secondary | ICD-10-CM | POA: Diagnosis not present

## 2020-10-26 DIAGNOSIS — D631 Anemia in chronic kidney disease: Secondary | ICD-10-CM | POA: Diagnosis not present

## 2020-10-26 DIAGNOSIS — N183 Chronic kidney disease, stage 3 unspecified: Secondary | ICD-10-CM | POA: Diagnosis not present

## 2020-10-26 DIAGNOSIS — I129 Hypertensive chronic kidney disease with stage 1 through stage 4 chronic kidney disease, or unspecified chronic kidney disease: Secondary | ICD-10-CM | POA: Diagnosis not present

## 2020-10-26 DIAGNOSIS — E538 Deficiency of other specified B group vitamins: Secondary | ICD-10-CM | POA: Diagnosis not present

## 2020-10-26 DIAGNOSIS — Z471 Aftercare following joint replacement surgery: Secondary | ICD-10-CM | POA: Diagnosis not present

## 2020-10-30 DIAGNOSIS — E538 Deficiency of other specified B group vitamins: Secondary | ICD-10-CM | POA: Diagnosis not present

## 2020-10-30 DIAGNOSIS — D631 Anemia in chronic kidney disease: Secondary | ICD-10-CM | POA: Diagnosis not present

## 2020-10-30 DIAGNOSIS — G4733 Obstructive sleep apnea (adult) (pediatric): Secondary | ICD-10-CM | POA: Diagnosis not present

## 2020-10-30 DIAGNOSIS — E785 Hyperlipidemia, unspecified: Secondary | ICD-10-CM | POA: Diagnosis not present

## 2020-10-30 DIAGNOSIS — N183 Chronic kidney disease, stage 3 unspecified: Secondary | ICD-10-CM | POA: Diagnosis not present

## 2020-10-30 DIAGNOSIS — R7303 Prediabetes: Secondary | ICD-10-CM | POA: Diagnosis not present

## 2020-10-30 DIAGNOSIS — Z471 Aftercare following joint replacement surgery: Secondary | ICD-10-CM | POA: Diagnosis not present

## 2020-10-30 DIAGNOSIS — M19042 Primary osteoarthritis, left hand: Secondary | ICD-10-CM | POA: Diagnosis not present

## 2020-10-30 DIAGNOSIS — I129 Hypertensive chronic kidney disease with stage 1 through stage 4 chronic kidney disease, or unspecified chronic kidney disease: Secondary | ICD-10-CM | POA: Diagnosis not present

## 2020-11-01 DIAGNOSIS — E538 Deficiency of other specified B group vitamins: Secondary | ICD-10-CM | POA: Diagnosis not present

## 2020-11-01 DIAGNOSIS — G4733 Obstructive sleep apnea (adult) (pediatric): Secondary | ICD-10-CM | POA: Diagnosis not present

## 2020-11-01 DIAGNOSIS — M19042 Primary osteoarthritis, left hand: Secondary | ICD-10-CM | POA: Diagnosis not present

## 2020-11-01 DIAGNOSIS — R7303 Prediabetes: Secondary | ICD-10-CM | POA: Diagnosis not present

## 2020-11-01 DIAGNOSIS — I129 Hypertensive chronic kidney disease with stage 1 through stage 4 chronic kidney disease, or unspecified chronic kidney disease: Secondary | ICD-10-CM | POA: Diagnosis not present

## 2020-11-01 DIAGNOSIS — Z471 Aftercare following joint replacement surgery: Secondary | ICD-10-CM | POA: Diagnosis not present

## 2020-11-01 DIAGNOSIS — E785 Hyperlipidemia, unspecified: Secondary | ICD-10-CM | POA: Diagnosis not present

## 2020-11-01 DIAGNOSIS — D631 Anemia in chronic kidney disease: Secondary | ICD-10-CM | POA: Diagnosis not present

## 2020-11-01 DIAGNOSIS — N183 Chronic kidney disease, stage 3 unspecified: Secondary | ICD-10-CM | POA: Diagnosis not present

## 2020-11-05 DIAGNOSIS — N183 Chronic kidney disease, stage 3 unspecified: Secondary | ICD-10-CM | POA: Diagnosis not present

## 2020-11-05 DIAGNOSIS — R7303 Prediabetes: Secondary | ICD-10-CM | POA: Diagnosis not present

## 2020-11-05 DIAGNOSIS — M19042 Primary osteoarthritis, left hand: Secondary | ICD-10-CM | POA: Diagnosis not present

## 2020-11-05 DIAGNOSIS — D631 Anemia in chronic kidney disease: Secondary | ICD-10-CM | POA: Diagnosis not present

## 2020-11-05 DIAGNOSIS — E538 Deficiency of other specified B group vitamins: Secondary | ICD-10-CM | POA: Diagnosis not present

## 2020-11-05 DIAGNOSIS — I129 Hypertensive chronic kidney disease with stage 1 through stage 4 chronic kidney disease, or unspecified chronic kidney disease: Secondary | ICD-10-CM | POA: Diagnosis not present

## 2020-11-05 DIAGNOSIS — Z471 Aftercare following joint replacement surgery: Secondary | ICD-10-CM | POA: Diagnosis not present

## 2020-11-05 DIAGNOSIS — G4733 Obstructive sleep apnea (adult) (pediatric): Secondary | ICD-10-CM | POA: Diagnosis not present

## 2020-11-05 DIAGNOSIS — E785 Hyperlipidemia, unspecified: Secondary | ICD-10-CM | POA: Diagnosis not present

## 2020-11-13 DIAGNOSIS — Z471 Aftercare following joint replacement surgery: Secondary | ICD-10-CM | POA: Diagnosis not present

## 2020-11-13 DIAGNOSIS — I129 Hypertensive chronic kidney disease with stage 1 through stage 4 chronic kidney disease, or unspecified chronic kidney disease: Secondary | ICD-10-CM | POA: Diagnosis not present

## 2020-11-13 DIAGNOSIS — N183 Chronic kidney disease, stage 3 unspecified: Secondary | ICD-10-CM | POA: Diagnosis not present

## 2020-11-13 DIAGNOSIS — E785 Hyperlipidemia, unspecified: Secondary | ICD-10-CM | POA: Diagnosis not present

## 2020-11-13 DIAGNOSIS — E538 Deficiency of other specified B group vitamins: Secondary | ICD-10-CM | POA: Diagnosis not present

## 2020-11-13 DIAGNOSIS — R7303 Prediabetes: Secondary | ICD-10-CM | POA: Diagnosis not present

## 2020-11-13 DIAGNOSIS — M19042 Primary osteoarthritis, left hand: Secondary | ICD-10-CM | POA: Diagnosis not present

## 2020-11-13 DIAGNOSIS — G4733 Obstructive sleep apnea (adult) (pediatric): Secondary | ICD-10-CM | POA: Diagnosis not present

## 2020-11-13 DIAGNOSIS — D631 Anemia in chronic kidney disease: Secondary | ICD-10-CM | POA: Diagnosis not present

## 2020-11-19 DIAGNOSIS — M19042 Primary osteoarthritis, left hand: Secondary | ICD-10-CM | POA: Diagnosis not present

## 2020-11-19 DIAGNOSIS — R7303 Prediabetes: Secondary | ICD-10-CM | POA: Diagnosis not present

## 2020-11-19 DIAGNOSIS — E785 Hyperlipidemia, unspecified: Secondary | ICD-10-CM | POA: Diagnosis not present

## 2020-11-19 DIAGNOSIS — I129 Hypertensive chronic kidney disease with stage 1 through stage 4 chronic kidney disease, or unspecified chronic kidney disease: Secondary | ICD-10-CM | POA: Diagnosis not present

## 2020-11-19 DIAGNOSIS — N183 Chronic kidney disease, stage 3 unspecified: Secondary | ICD-10-CM | POA: Diagnosis not present

## 2020-11-19 DIAGNOSIS — D631 Anemia in chronic kidney disease: Secondary | ICD-10-CM | POA: Diagnosis not present

## 2020-11-19 DIAGNOSIS — Z471 Aftercare following joint replacement surgery: Secondary | ICD-10-CM | POA: Diagnosis not present

## 2020-11-19 DIAGNOSIS — G4733 Obstructive sleep apnea (adult) (pediatric): Secondary | ICD-10-CM | POA: Diagnosis not present

## 2020-11-19 DIAGNOSIS — E538 Deficiency of other specified B group vitamins: Secondary | ICD-10-CM | POA: Diagnosis not present

## 2020-11-23 DIAGNOSIS — M1611 Unilateral primary osteoarthritis, right hip: Secondary | ICD-10-CM | POA: Diagnosis not present

## 2020-11-23 DIAGNOSIS — Z96641 Presence of right artificial hip joint: Secondary | ICD-10-CM | POA: Diagnosis not present

## 2020-11-26 DIAGNOSIS — S63601A Unspecified sprain of right thumb, initial encounter: Secondary | ICD-10-CM | POA: Diagnosis not present

## 2020-11-28 DIAGNOSIS — E790 Hyperuricemia without signs of inflammatory arthritis and tophaceous disease: Secondary | ICD-10-CM | POA: Diagnosis not present

## 2020-11-28 DIAGNOSIS — I129 Hypertensive chronic kidney disease with stage 1 through stage 4 chronic kidney disease, or unspecified chronic kidney disease: Secondary | ICD-10-CM | POA: Diagnosis not present

## 2020-11-28 DIAGNOSIS — R809 Proteinuria, unspecified: Secondary | ICD-10-CM | POA: Diagnosis not present

## 2020-11-28 DIAGNOSIS — N1832 Chronic kidney disease, stage 3b: Secondary | ICD-10-CM | POA: Diagnosis not present

## 2020-11-28 DIAGNOSIS — D631 Anemia in chronic kidney disease: Secondary | ICD-10-CM | POA: Diagnosis not present

## 2020-12-06 ENCOUNTER — Ambulatory Visit (INDEPENDENT_AMBULATORY_CARE_PROVIDER_SITE_OTHER): Payer: BC Managed Care – PPO

## 2020-12-06 ENCOUNTER — Other Ambulatory Visit: Payer: Self-pay

## 2020-12-06 VITALS — BP 122/80 | HR 92 | Temp 98.1°F | Resp 16 | Ht 63.0 in | Wt 171.9 lb

## 2020-12-06 DIAGNOSIS — Z0000 Encounter for general adult medical examination without abnormal findings: Secondary | ICD-10-CM | POA: Diagnosis not present

## 2020-12-06 NOTE — Progress Notes (Signed)
Subjective:   Marissa Nelson is a 70 y.o. female who presents for Medicare Annual (Subsequent) preventive examination.  Review of Systems     Cardiac Risk Factors include: advanced age (>50men, >71 women);dyslipidemia;hypertension;obesity (BMI >30kg/m2)     Objective:    Today's Vitals   12/06/20 0925 12/06/20 0927  BP: 122/80   Pulse: 92   Resp: 16   Temp: 98.1 F (36.7 C)   TempSrc: Oral   SpO2: 99%   Weight: 171 lb 14.4 oz (78 kg)   Height: 5\' 3"  (1.6 m)   PainSc:  2    Body mass index is 30.45 kg/m.  Advanced Directives 12/06/2020 10/11/2020 09/28/2020 08/31/2020 03/20/2020 11/17/2019 05/07/2019  Does Patient Have a Medical Advance Directive? Yes No No No Yes Yes No;Yes  Type of Paramedic of [place];Living will - - - [place];Living will [place];Living will [place]  Does patient want to make changes to medical advance directive? - - - - No - Patient declined - -  Copy of [place] in Chart? No - copy requested - - - No - copy requested No - copy requested No - copy requested  Would patient like information on creating a medical advance directive? - No - Patient declined Yes (MAU/Ambulatory/Procedural Areas - Information given) No - Patient declined - - No - Patient declined    Current Medications (verified) Outpatient Encounter Medications as of 12/06/2020  Medication Sig   acetaminophen (TYLENOL) 325 MG tablet Take 650 mg by mouth every 6 (six) hours as needed for mild pain or headache.    albuterol (VENTOLIN HFA) 108 (90 Base) MCG/ACT inhaler Inhale 2-4 puffs by mouth every 4 hours as needed for wheezing, cough, and/or shortness of breath   anastrozole (ARIMIDEX) 1 MG tablet Take 1 tablet (1 mg total) by mouth in the morning.   Ascorbic Acid (VITAMIN C) 1000 MG tablet Take 1,000 mg by mouth in the morning.   atorvastatin (LIPITOR) 20 MG tablet Take 1 tablet (20 mg  total) by mouth at bedtime.   calcium carbonate (OS-CAL) 600 MG TABS tablet Take 600 mg by mouth daily with breakfast.   Cholecalciferol (VITAMIN D3) 50 MCG (2000 UT) TABS Take 2,000 Units by mouth daily.   Cyanocobalamin (VITAMIN B-12) 500 MCG SUBL Place 500 mcg under the tongue daily.   latanoprost (XALATAN) 0.005 % ophthalmic solution Place 1 drop into both eyes at bedtime.   metoprolol succinate (TOPROL-XL) 25 MG 24 hr tablet Take 1 tablet (25 mg total) by mouth daily.   torsemide (DEMADEX) 10 MG tablet Take 10 mg by mouth daily as needed (fluid).   triamterene-hydrochlorothiazide (MAXZIDE-25) 37.5-25 MG tablet Take 1 tablet by mouth daily.   [DISCONTINUED] docusate sodium (COLACE) 100 MG capsule Take 1 capsule (100 mg total) by mouth 2 (two) times daily.   [DISCONTINUED] enoxaparin (LOVENOX) 40 MG/0.4ML injection Inject 0.4 mLs (40 mg total) into the skin daily for 14 days.   [DISCONTINUED] HYDROcodone-acetaminophen (NORCO/VICODIN) 5-325 MG tablet Take 1-2 tablets by mouth every 4 (four) hours as needed for moderate pain (pain score 4-6).   [DISCONTINUED] methocarbamol (ROBAXIN) 500 MG tablet Take 1 tablet (500 mg total) by mouth every 6 (six) hours as needed for muscle spasms.   [DISCONTINUED] traMADol (ULTRAM) 50 MG tablet Take 1 tablet (50 mg total) by mouth every 6 (six) hours as needed.   No facility-administered encounter medications on file as of 12/06/2020.  Allergies (verified) Penicillins and Shellfish allergy   History: Past Medical History:  Diagnosis Date   Abnormal mammogram of right breast 01/17/2016   Recommendation: Six month follow-up diagnostic mammogram of the right breast for an additional group of probably benign Calcifications. 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   RIGHT, August 2018   Family history of diabetes mellitus 06/12/2016   Mother and several other relatives   HLD (hyperlipidemia) 06/15/2015   Hx of right breast biopsy 01/17/2016   Hyperlipidemia    Hypertension    Medication monitoring encounter 11/15/2015   Motion sickness    ocean ships   Numbness and tingling in left hand 11/07/2014   Obesity 10/25/2015   Obesity (BMI 30.0-34.9) 10/25/2015   Personal history of radiation therapy 2018   right breast ca   PONV (postoperative nausea and vomiting)    patient stated she vomits a lot after anesthsia   Positive ANA (antinuclear antibody) 10/04/2016   Prediabetes    Preventative health care 11/15/2015   Vitamin D deficiency 10/29/2015   Past Surgical History:  Procedure Laterality Date   ABDOMINAL HYSTERECTOMY     BREAST BIOPSY Right 01/15/2016    cylinder shape calcs UOQ, FIBROADENOMATOUS   BREAST BIOPSY Right 12/30/2016   stereo bx for calcifications: DCIS UOQ, ribbon shaped   BREAST BIOPSY Right 01/08/2017   LOQ x shaped, FIBROADENOMATOUS CHANGE WITH   BREAST BIOPSY Left 03/09/2019   calcs bx, x marker path pending   BREAST LUMPECTOMY Right 01/30/2018   DCIS clear margins   BREAST LUMPECTOMY WITH NEEDLE LOCALIZATION Right 01/30/2017   Procedure: BREAST LUMPECTOMY WITH NEEDLE LOCALIZATION;  Surgeon: Vickie Epley, MD;  Location: [facility];  Service: General;  Laterality: Right;   COLONOSCOPY WITH PROPOFOL N/A 11/19/2016   Procedure: COLONOSCOPY WITH PROPOFOL;  Surgeon: Jonathon Bellows, MD;  Location: [facility];  Service: Endoscopy;  Laterality: N/A;   ESOPHAGOGASTRODUODENOSCOPY (EGD) WITH PROPOFOL N/A 11/19/2016   Procedure: ESOPHAGOGASTRODUODENOSCOPY (EGD) WITH PROPOFOL;  Surgeon: Jonathon Bellows, MD;  Location: [facility];  Service: Endoscopy;  Laterality: N/A;   GIVENS CAPSULE STUDY N/A 12/12/2016   Procedure: GIVENS  CAPSULE STUDY;  Surgeon: Jonathon Bellows, MD;  Location: [facility];  Service: Gastroenterology;  Laterality: N/A;   TOTAL HIP ARTHROPLASTY Right 10/11/2020   Procedure: TOTAL HIP ARTHROPLASTY ANTERIOR APPROACH;  Surgeon: Hessie Knows, MD;  Location: [facility];  Service: Orthopedics;  Laterality: Right;   TOTAL MASTECTOMY Right 01/30/2017   Error   Family History  Problem Relation Age of Onset   Cancer Mother 32       liver cancer   Diabetes Mother    Hypertension Mother    Glomerulonephritis Father    Diabetes Sister    Glomerulonephritis Sister    Cancer Sister 15       throat cancer   Heart disease Sister    Diabetes Sister    Diabetes Sister    COPD Sister    Diabetes Sister    Breast cancer Cousin        Currently undergoing systemic chemotherapy (01/2017)   Varicose Veins Neg Hx    Social History  Socioeconomic History   Marital status: Married    Spouse name: Not on file   Number of children: 2   Years of education: Not on file   Highest education level: Not on file  Occupational History   Occupation: Works in [employer] [place], [place])    Employer: [employer]  Tobacco Use   Smoking status: Never   Smokeless tobacco: Never  Vaping Use   Vaping Use: Never used  Substance and Sexual Activity   Alcohol use: No    Alcohol/week: 0.0 standard drinks   Drug use: No   Sexual activity: Not Currently  Other Topics Concern   Not on file  Social History Narrative   Not on file   Social Determinants of Health   Financial Resource Strain: Low Risk    Difficulty of Paying Living Expenses: Not hard at all  Food Insecurity: No Food Insecurity   Worried About Charity fundraiser in the Last Year: Never true   [place] in the Last Year: Never true  Transportation Needs: No Transportation Needs   Lack of Transportation (Medical): No   Lack of Transportation (Non-Medical): No  Physical Activity: Insufficiently Active   Days of Exercise per  Week: 7 days   Minutes of Exercise per Session: 20 min  Stress: No Stress Concern Present   Feeling of Stress : Not at all  Social Connections: Moderately Integrated   Frequency of Communication with Friends and Family: More than three times a week   Frequency of Social Gatherings with Friends and Family: More than three times a week   Attends Religious Services: More than 4 times per year   Active Member of [employer] or Organizations: No   Attends Music therapist: Never   Marital Status: Married    Tobacco Counseling Counseling given: Not Answered   Clinical Intake:  Pre-visit preparation completed: Yes  Pain : 0-10 Pain Score: 2  Pain Type: Chronic pain Pain Location: Hip Pain Orientation: Right Pain Descriptors / Indicators: Aching, Nagging Pain Onset: More than a month ago Pain Frequency: Intermittent     BMI - recorded: 30.45 Nutritional Status: BMI > 30  Obese Nutritional Risks: None Diabetes: No  How often do you need to have someone help you when you read instructions, pamphlets, or other written materials from your doctor or pharmacy?: 1 - Never    Interpreter Needed?: No  Information entered by :: Clemetine Marker LPN   Activities of Daily Living In your present state of health, do you have any difficulty performing the following activities: 12/06/2020 10/11/2020  Hearing? N N  Vision? N N  Difficulty concentrating or making decisions? N N  Walking or climbing stairs? N N  Dressing or bathing? N N  Doing errands, shopping? N N  Preparing Food and eating ? N -  Using the Toilet? N -  In the past six months, have you accidently leaked urine? N -  Do you have problems with loss of bowel control? N -  Managing your Medications? N -  Managing your Finances? N -  Housekeeping or managing your Housekeeping? N -  Some recent data might be hidden    Patient Care Team: Delsa Grana, PA-C as PCP - General (Family Medicine)  Indicate any recent  Medical Services you may have received from other than [facility] providers in the past year (date may be approximate).     Assessment:   This is a routine wellness examination  for [employer].  Hearing/Vision screen Hearing Screening - Comments:: Pt denies hearing difficulty  Vision Screening - Comments:: Annual vision screenings done at [facility] issues and exercise activities discussed: Current Exercise Habits: Home exercise routine, Type of exercise: walking, Time (Minutes): 20, Frequency (Times/Week): 7, Weekly Exercise (Minutes/Week): 140, Intensity: Mild, Exercise limited by: orthopedic condition(s)   Goals Addressed   None    Depression Screen PHQ 2/9 Scores 12/06/2020 10/08/2020 04/10/2020 11/17/2019 05/23/2019 11/24/2018 05/14/2018  PHQ - 2 Score 0 0 0 2 1 0 0  PHQ- 9 Score - 0 - 8 1 0 0    Fall Risk Fall Risk  12/06/2020 10/08/2020 04/10/2020 11/17/2019 05/23/2019  Falls in the past year? 0 0 0 0 0  Number falls in past yr: 0 0 0 0 0  Injury with Fall? 0 0 0 0 0  Risk for fall due to : No Fall Risks - - No Fall Risks -  Follow up Falls prevention discussed - Falls evaluation completed Falls prevention discussed -    FALL RISK PREVENTION PERTAINING TO THE HOME:  Any stairs in or around the home? Yes  If so, are there any without handrails? No  Home free of loose throw rugs in walkways, pet beds, electrical cords, etc? Yes  Adequate lighting in your home to reduce risk of falls? Yes   ASSISTIVE DEVICES UTILIZED TO PREVENT FALLS:  Life alert? No  Use of a cane, walker or w/c? Yes  - cane when needed Grab bars in the bathroom? Yes  Shower chair or bench in shower? No  Elevated toilet seat or a handicapped toilet? Yes   TIMED UP AND GO:  Was the test performed? Yes .  Length of time to ambulate 10 feet: 5 sec.   Gait steady and fast without use of assistive device  Cognitive Function: Normal cognitive status assessed by direct observation by this Nurse Health  Advisor. No abnormalities found.          Immunizations Immunization History  Administered Date(s) Administered   Fluad Quad(high Dose 65+) 01/19/2019, 02/03/2020   Influenza, High Dose Seasonal PF 03/18/2016, 02/24/2018   Influenza,inj,Quad PF,6+ Mos 02/10/2017   Moderna Sars-Covid-2 Vaccination 06/18/2019, 07/16/2019   Pneumococcal Conjugate-13 11/15/2015   Pneumococcal Polysaccharide-23 06/08/2017   Tdap 08/02/2013    TDAP status: Up to date  Flu Vaccine status: Up to date  Pneumococcal vaccine status: Up to date  Covid-19 vaccine status: Completed vaccines  Qualifies for Shingles Vaccine? Yes   Zostavax completed No   Shingrix Completed?: No.    Education has been provided regarding the importance of this vaccine. Patient has been advised to call insurance company to determine out of pocket expense if they have not yet received this vaccine. Advised may also receive vaccine at local pharmacy or Health Dept. Verbalized acceptance and understanding.  Screening Tests Health Maintenance  Topic Date Due   COVID-19 Vaccine (3 - Moderna risk series) 08/13/2019   INFLUENZA VACCINE  12/03/2020   Zoster Vaccines- Shingrix (1 of 2) 01/08/2021 (Originally 10/31/1969)   MAMMOGRAM  03/15/2021   TETANUS/TDAP  08/03/2023   COLONOSCOPY (Pts 45-34yrs Insurance coverage will need to be confirmed)  11/20/2026   DEXA SCAN  Completed   Hepatitis C Screening  Completed   PNA vac Low Risk Adult  Completed   HPV VACCINES  Aged Out    Health Maintenance  Health Maintenance Due  Topic Date Due   COVID-19 Vaccine (3 - Moderna risk  series) 08/13/2019   INFLUENZA VACCINE  12/03/2020    Colorectal cancer screening: Type of screening: Colonoscopy. Completed 11/19/16. Repeat every 10 years  Mammogram status: Completed 03/15/20. Repeat every year  Bone Density status: Completed 10/28/17. Results reflect: Bone density results: NORMAL. Repeat every 2 years.  Lung Cancer Screening: (Low Dose  CT Chest recommended if Age 44-80 years, 30 pack-year currently smoking OR have quit w/in 15years.) does not qualify.   Additional Screening:  Hepatitis C Screening: does qualify; Completed 11/15/15  Vision Screening: Recommended annual ophthalmology exams for early detection of glaucoma and other disorders of the eye. Is the patient up to date with their annual eye exam?  Yes  Who is the provider or what is the name of the office in which the patient attends annual eye exams? Dr. Gwynn Burly.   Dental Screening: Recommended annual dental exams for proper oral hygiene  Community Resource Referral / Chronic Care Management: CRR required this visit?  No   CCM required this visit?  No      Plan:     I have personally reviewed and noted the following in the patient's chart:   Medical and social history Use of alcohol, tobacco or illicit drugs  Current medications and supplements including opioid prescriptions.  Functional ability and status Nutritional status Physical activity Advanced directives List of other physicians Hospitalizations, surgeries, and ER visits in previous 12 months Vitals Screenings to include cognitive, depression, and falls Referrals and appointments  In addition, I have reviewed and discussed with patient certain preventive protocols, quality metrics, and best practice recommendations. A written personalized care plan for preventive services as well as general preventive health recommendations were provided to patient.     Clemetine Marker, LPN   07/04/9189   Nurse Notes: none

## 2020-12-06 NOTE — Patient Instructions (Signed)
[name] , Thank you for taking time to come for your Medicare Wellness Visit. I appreciate your ongoing commitment to your health goals. Please review the following plan we discussed and let me know if I can assist you in the future.   Screening recommendations/referrals: Colonoscopy: done 11/19/16. Due 11/2026 Mammogram: done 03/15/20 Bone Density: done 10/28/17; scheduled for 03/04/21 Recommended yearly ophthalmology/optometry visit for glaucoma screening and checkup Recommended yearly dental visit for hygiene and checkup  Vaccinations: Influenza vaccine: done 02/03/20 Pneumococcal vaccine: done 06/08/17 Tdap vaccine: done 08/02/13 Shingles vaccine: Shingrix discussed. Please contact your pharmacy for coverage information.  Covid-19: done 06/18/19 & 07/16/19  Advanced directives: Please bring a copy of your health care power of attorney and living will to the office at your convenience.   Conditions/risks identified: Keep up the great work!   Next appointment: Follow up in one year for your annual wellness visit    Preventive Care 65 Years and Older, Female Preventive care refers to lifestyle choices and visits with your health care provider that can promote health and wellness. What does preventive care include? A yearly physical exam. This is also called an annual well check. Dental exams once or twice a year. Routine eye exams. Ask your health care provider how often you should have your eyes checked. Personal lifestyle choices, including: Daily care of your teeth and gums. Regular physical activity. Eating a healthy diet. Avoiding tobacco and drug use. Limiting alcohol use. Practicing safe sex. Taking low-dose aspirin every day. Taking vitamin and mineral supplements as recommended by your health care provider. What happens during an annual well check? The services and screenings done by your health care provider during your annual well check will depend on your age,  overall health, lifestyle risk factors, and family history of disease. Counseling  Your health care provider may ask you questions about your: Alcohol use. Tobacco use. Drug use. Emotional well-being. Home and relationship well-being. Sexual activity. Eating habits. History of falls. Memory and ability to understand (cognition). Work and work Statistician. Reproductive health. Screening  You may have the following tests or measurements: Height, weight, and BMI. Blood pressure. Lipid and cholesterol levels. These may be checked every 5 years, or more frequently if you are over 59 years old. Skin check. Lung cancer screening. You may have this screening every year starting at age 46 if you have a 30-pack-year history of smoking and currently smoke or have quit within the past 15 years. Fecal occult blood test (FOBT) of the stool. You may have this test every year starting at age 75. Flexible sigmoidoscopy or colonoscopy. You may have a sigmoidoscopy every 5 years or a colonoscopy every 10 years starting at age 39. Hepatitis C blood test. Hepatitis B blood test. Sexually transmitted disease (STD) testing. Diabetes screening. This is done by checking your blood sugar (glucose) after you have not eaten for a while (fasting). You may have this done every 1-3 years. Bone density scan. This is done to screen for osteoporosis. You may have this done starting at age 57. Mammogram. This may be done every 1-2 years. Talk to your health care provider about how often you should have regular mammograms. Talk with your health care provider about your test results, treatment options, and if necessary, the need for more tests. Vaccines  Your health care provider may recommend certain vaccines, such as: Influenza vaccine. This is recommended every year. Tetanus, diphtheria, and acellular pertussis (Tdap, Td) vaccine. You may need a Td booster  every 10 years. Zoster vaccine. You may need this after age  66. Pneumococcal 13-valent conjugate (PCV13) vaccine. One dose is recommended after age 32. Pneumococcal polysaccharide (PPSV23) vaccine. One dose is recommended after age 54. Talk to your health care provider about which screenings and vaccines you need and how often you need them. This information is not intended to replace advice given to you by your health care provider. Make sure you discuss any questions you have with your health care provider. Document Released: 05/18/2015 Document Revised: 01/09/2016 Document Reviewed: 02/20/2015 Elsevier Interactive Patient Education  2017 [place] Prevention in the Home Falls can cause injuries. They can happen to people of all ages. There are many things you can do to make your home safe and to help prevent falls. What can I do on the outside of my home? Regularly fix the edges of walkways and driveways and fix any cracks. Remove anything that might make you trip as you walk through a door, such as a raised step or threshold. Trim any bushes or trees on the path to your home. Use bright outdoor lighting. Clear any walking paths of anything that might make someone trip, such as rocks or tools. Regularly check to see if handrails are loose or broken. Make sure that both sides of any steps have handrails. Any raised decks and porches should have guardrails on the edges. Have any leaves, snow, or ice cleared regularly. Use sand or salt on walking paths during winter. Clean up any spills in your garage right away. This includes oil or grease spills. What can I do in the bathroom? Use night lights. Install grab bars by the toilet and in the tub and shower. Do not use towel bars as grab bars. Use non-skid mats or decals in the tub or shower. If you need to sit down in the shower, use a plastic, non-slip stool. Keep the floor dry. Clean up any water that spills on the floor as soon as it happens. Remove soap buildup in the tub or shower  regularly. Attach bath mats securely with double-sided non-slip rug tape. Do not have throw rugs and other things on the floor that can make you trip. What can I do in the bedroom? Use night lights. Make sure that you have a light by your bed that is easy to reach. Do not use any sheets or blankets that are too big for your bed. They should not hang down onto the floor. Have a firm chair that has side arms. You can use this for support while you get dressed. Do not have throw rugs and other things on the floor that can make you trip. What can I do in the kitchen? Clean up any spills right away. Avoid walking on wet floors. Keep items that you use a lot in easy-to-reach places. If you need to reach something above you, use a strong step stool that has a grab bar. Keep electrical cords out of the way. Do not use floor polish or wax that makes floors slippery. If you must use wax, use non-skid floor wax. Do not have throw rugs and other things on the floor that can make you trip. What can I do with my stairs? Do not leave any items on the stairs. Make sure that there are handrails on both sides of the stairs and use them. Fix handrails that are broken or loose. Make sure that handrails are as long as the stairways. Check any carpeting to  make sure that it is firmly attached to the stairs. Fix any carpet that is loose or worn. Avoid having throw rugs at the top or bottom of the stairs. If you do have throw rugs, attach them to the floor with carpet tape. Make sure that you have a light switch at the top of the stairs and the bottom of the stairs. If you do not have them, ask someone to add them for you. What else can I do to help prevent falls? Wear shoes that: Do not have high heels. Have rubber bottoms. Are comfortable and fit you well. Are closed at the toe. Do not wear sandals. If you use a stepladder: Make sure that it is fully opened. Do not climb a closed stepladder. Make sure that  both sides of the stepladder are locked into place. Ask someone to hold it for you, if possible. Clearly mark and make sure that you can see: Any grab bars or handrails. First and last steps. Where the edge of each step is. Use tools that help you move around (mobility aids) if they are needed. These include: Canes. Walkers. Scooters. Crutches. Turn on the lights when you go into a dark area. Replace any light bulbs as soon as they burn out. Set up your furniture so you have a clear path. Avoid moving your furniture around. If any of your floors are uneven, fix them. If there are any pets around you, be aware of where they are. Review your medicines with your doctor. Some medicines can make you feel dizzy. This can increase your chance of falling. Ask your doctor what other things that you can do to help prevent falls. This information is not intended to replace advice given to you by your health care provider. Make sure you discuss any questions you have with your health care provider. Document Released: 02/15/2009 Document Revised: 09/27/2015 Document Reviewed: 05/26/2014 Elsevier Interactive Patient Education  2017 [employer].

## 2021-01-18 ENCOUNTER — Ambulatory Visit (INDEPENDENT_AMBULATORY_CARE_PROVIDER_SITE_OTHER): Payer: BC Managed Care – PPO | Admitting: Emergency Medicine

## 2021-01-18 ENCOUNTER — Other Ambulatory Visit: Payer: Self-pay

## 2021-01-18 DIAGNOSIS — Z23 Encounter for immunization: Secondary | ICD-10-CM

## 2021-01-29 DIAGNOSIS — L03011 Cellulitis of right finger: Secondary | ICD-10-CM | POA: Diagnosis not present

## 2021-01-30 ENCOUNTER — Other Ambulatory Visit: Payer: Self-pay

## 2021-01-30 DIAGNOSIS — Z1231 Encounter for screening mammogram for malignant neoplasm of breast: Secondary | ICD-10-CM

## 2021-02-08 DIAGNOSIS — M25541 Pain in joints of right hand: Secondary | ICD-10-CM | POA: Diagnosis not present

## 2021-02-08 DIAGNOSIS — M654 Radial styloid tenosynovitis [de Quervain]: Secondary | ICD-10-CM | POA: Diagnosis not present

## 2021-02-08 DIAGNOSIS — M19041 Primary osteoarthritis, right hand: Secondary | ICD-10-CM | POA: Diagnosis not present

## 2021-03-01 ENCOUNTER — Inpatient Hospital Stay: Payer: BC Managed Care – PPO | Attending: Oncology | Admitting: Oncology

## 2021-03-01 ENCOUNTER — Other Ambulatory Visit: Payer: Self-pay

## 2021-03-01 ENCOUNTER — Encounter: Payer: Self-pay | Admitting: Oncology

## 2021-03-01 VITALS — BP 112/73 | HR 81 | Temp 97.2°F | Wt 169.0 lb

## 2021-03-01 DIAGNOSIS — D0511 Intraductal carcinoma in situ of right breast: Secondary | ICD-10-CM | POA: Diagnosis present

## 2021-03-01 DIAGNOSIS — Z923 Personal history of irradiation: Secondary | ICD-10-CM | POA: Insufficient documentation

## 2021-03-01 DIAGNOSIS — Z5181 Encounter for therapeutic drug level monitoring: Secondary | ICD-10-CM

## 2021-03-01 DIAGNOSIS — Z08 Encounter for follow-up examination after completed treatment for malignant neoplasm: Secondary | ICD-10-CM | POA: Diagnosis not present

## 2021-03-01 DIAGNOSIS — Z86000 Personal history of in-situ neoplasm of breast: Secondary | ICD-10-CM | POA: Diagnosis not present

## 2021-03-01 DIAGNOSIS — Z79811 Long term (current) use of aromatase inhibitors: Secondary | ICD-10-CM | POA: Diagnosis not present

## 2021-03-01 NOTE — Progress Notes (Signed)
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 ER greater than 90% positive and PR was 11-50% positive. Biopsy of the right lower quadrant of the right breast showed fibroadenomatous changes with calcifications.Patient underwent lumpectomy on 01/30/2017 which showed a 15 mm DCIS, grade 2 with comedonecrosis. Margins were -3 mm from the closest medial margin lymph nodes were not submitted. Pathology stage pTisNx. ER greater than 90% positive PR 11-50% positive. Patient completed adjuvant RT and started arimidex in jan 2019    Interval history-patient had her right hip surgery in May 2022 and states that her appetite has changed since then and has not returned back to baseline.She is trying to eat as well as she can but has lost about 8 pounds in the last 4 months.  Denies any breast concerns at this time.  Otherwise tolerating Arimidex well without any significant side effects.  ECOG PS- 1 Pain scale- 1   Review of systems- Review of Systems  Constitutional:  Positive for weight loss. Negative for chills, fever and malaise/fatigue.  HENT:  Negative for congestion, ear discharge and nosebleeds.   Eyes:  Negative for blurred vision.  Respiratory:  Negative for cough, hemoptysis, sputum production, shortness of breath and wheezing.   Cardiovascular:  Negative  for chest pain, palpitations, orthopnea and claudication.  Gastrointestinal:  Negative for abdominal pain, blood in stool, constipation, diarrhea, heartburn, melena, nausea and vomiting.  Genitourinary:  Negative for dysuria, flank pain, frequency, hematuria and urgency.  Musculoskeletal:  Negative for back pain, joint pain and myalgias.  Skin:  Negative for rash.  Neurological:  Negative for dizziness, tingling, focal weakness, seizures, weakness and headaches.  Endo/Heme/Allergies:  Does not bruise/bleed easily.  Psychiatric/Behavioral:  Negative for depression and suicidal ideas. The patient does not have insomnia.      Allergies  Allergen Reactions   Penicillins Anaphylaxis   Shellfish Allergy Swelling    angioedema     Past Medical History:  Diagnosis Date   Abnormal mammogram of right breast 01/17/2016   Recommendation: Six month follow-up diagnostic mammogram of the right breast for an additional group of probably benign Calcifications. Sept 2017 --> due March 2018   Arthralgia 10/04/2016   Arthritis    left hand   Breast cancer ([place]) 2018   right breast DCIS with rad tx   Breast cancer screening 11/15/2015   Chronic kidney disease (CKD), stage III (moderate) (HCC) 11/15/2015   Chronic left shoulder pain 11/08/2014   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   RIGHT, August 2018   Family history of diabetes mellitus 06/12/2016   Mother and several other relatives   HLD (hyperlipidemia) 06/15/2015   Hx of right breast biopsy 01/17/2016   Hyperlipidemia    Hypertension    Medication monitoring encounter 11/15/2015   Motion sickness    ocean ships   Numbness and tingling in left hand 11/07/2014   Obesity 10/25/2015   Obesity (BMI  30.0-34.9) 10/25/2015   Personal history of radiation therapy 2018   right breast ca   PONV (postoperative nausea and vomiting)    patient stated she vomits a lot after anesthsia   Positive ANA (antinuclear antibody) 10/04/2016   Prediabetes     Preventative health care 11/15/2015   Vitamin D deficiency 10/29/2015     Past Surgical History:  Procedure Laterality Date   ABDOMINAL HYSTERECTOMY     BREAST BIOPSY Right 01/15/2016    cylinder shape calcs UOQ, FIBROADENOMATOUS   BREAST BIOPSY Right 12/30/2016   stereo bx for calcifications: DCIS UOQ, ribbon shaped   BREAST BIOPSY Right 01/08/2017   LOQ x shaped, FIBROADENOMATOUS CHANGE WITH   BREAST BIOPSY Left 03/09/2019   calcs bx, x marker path pending   BREAST LUMPECTOMY Right 01/30/2018   DCIS clear margins   BREAST LUMPECTOMY WITH NEEDLE LOCALIZATION Right 01/30/2017   Procedure: BREAST LUMPECTOMY WITH NEEDLE LOCALIZATION;  Surgeon: Vickie Epley, MD;  Location: [facility];  Service: General;  Laterality: Right;   COLONOSCOPY WITH PROPOFOL N/A 11/19/2016   Procedure: COLONOSCOPY WITH PROPOFOL;  Surgeon: Jonathon Bellows, MD;  Location: [place];  Service: Endoscopy;  Laterality: N/A;   ESOPHAGOGASTRODUODENOSCOPY (EGD) WITH PROPOFOL N/A 11/19/2016   Procedure: ESOPHAGOGASTRODUODENOSCOPY (EGD) WITH PROPOFOL;  Surgeon: Jonathon Bellows, MD;  Location: [place];  Service: Endoscopy;  Laterality: N/A;   GIVENS CAPSULE STUDY N/A 12/12/2016   Procedure: GIVENS CAPSULE STUDY;  Surgeon: Jonathon Bellows, MD;  Location[facility];  Service: Gastroenterology;  Laterality: N/A;   TOTAL HIP ARTHROPLASTY Right 10/11/2020   Procedure: TOTAL HIP ARTHROPLASTY ANTERIOR APPROACH;  Surgeon: Hessie Knows, MD;  Location: [facility];  Service: Orthopedics;  Laterality: Right;   TOTAL MASTECTOMY Right 01/30/2017   Error    Social History   Socioeconomic History   Marital status: Married    Spouse name: Not on file   Number of children: 2   Years of education: Not on file   Highest education level: Not on file  Occupational History   Occupation: Works in [employer] ([place], [place])    Employer: [employer]  Tobacco Use   Smoking status: Never   Smokeless tobacco:  Never  Vaping Use   Vaping Use: Never used  Substance and Sexual Activity   Alcohol use: No    Alcohol/week: 0.0 standard drinks   Drug use: No   Sexual activity: Not Currently  Other Topics Concern   Not on file  Social History Narrative   Not on file   Social Determinants of Health   Financial Resource Strain: Low Risk    Difficulty of Paying Living Expenses: Not hard at all  Food Insecurity: No Food Insecurity   Worried About Charity fundraiser in the Last Year: Never true   [place] in the Last Year: Never true  Transportation Needs: No Transportation Needs   Lack of Transportation (Medical): No   Lack of Transportation (Non-Medical): No  Physical Activity: Insufficiently Active   Days of Exercise per Week: 7 days   Minutes of Exercise per Session: 20 min  Stress: No Stress Concern Present   Feeling of Stress : Not at all  Social Connections: Moderately Integrated   Frequency of Communication with Friends and Family: More than three times a week   Frequency of Social Gatherings with Friends and Family: More than three times a week   Attends Religious Services: More than 4 times per year  Active Member of Clubs or Organizations: No   Attends Archivist Meetings: Never   Marital Status: Married  Human resources officer Violence: Not At Risk   Fear of Current or Ex-Partner: No   Emotionally Abused: No   Physically Abused: No   Sexually Abused: No    Family History  Problem Relation Age of Onset   Cancer Mother 1       liver cancer   Diabetes Mother    Hypertension Mother    Glomerulonephritis Father    Diabetes Sister    Glomerulonephritis Sister    Cancer Sister 48       throat cancer   Heart disease Sister    Diabetes Sister    Diabetes Sister    COPD Sister    Diabetes Sister    Breast cancer Cousin        Currently undergoing systemic chemotherapy (01/2017)   Varicose Veins Neg Hx      Current Outpatient Medications:    acetaminophen  (TYLENOL) 325 MG tablet, Take 650 mg by mouth every 6 (six) hours as needed for mild pain or headache. , Disp: , Rfl:    albuterol (VENTOLIN HFA) 108 (90 Base) MCG/ACT inhaler, Inhale 2-4 puffs by mouth every 4 hours as needed for wheezing, cough, and/or shortness of breath, Disp: 8 g, Rfl: 1   anastrozole (ARIMIDEX) 1 MG tablet, Take 1 tablet (1 mg total) by mouth in the morning., Disp: , Rfl:    Ascorbic Acid (VITAMIN C) 1000 MG tablet, Take 1,000 mg by mouth in the morning., Disp: , Rfl:    atorvastatin (LIPITOR) 20 MG tablet, Take 1 tablet (20 mg total) by mouth at bedtime., Disp: 90 tablet, Rfl: 3   calcium carbonate (OS-CAL) 600 MG TABS tablet, Take 600 mg by mouth daily with breakfast., Disp: , Rfl:    Cholecalciferol (VITAMIN D3) 50 MCG (2000 UT) TABS, Take 2,000 Units by mouth daily., Disp: , Rfl:    Cyanocobalamin (VITAMIN B-12) 500 MCG SUBL, Place 500 mcg under the tongue daily., Disp: , Rfl:    latanoprost (XALATAN) 0.005 % ophthalmic solution, Place 1 drop into both eyes at bedtime., Disp: , Rfl:    metoprolol succinate (TOPROL-XL) 25 MG 24 hr tablet, Take 1 tablet (25 mg total) by mouth daily., Disp: 90 tablet, Rfl: 3   torsemide (DEMADEX) 10 MG tablet, Take 10 mg by mouth daily as needed (fluid)., Disp: , Rfl:    triamterene-hydrochlorothiazide (MAXZIDE-25) 37.5-25 MG tablet, Take 1 tablet by mouth daily., Disp: 90 tablet, Rfl: 3  Physical exam:  Vitals:   03/01/21 1429  BP: 112/73  Pulse: 81  Temp: (!) 97.2 F (36.2 C)  TempSrc: Tympanic  SpO2: 100%  Weight: 169 lb (76.7 kg)   Physical Exam Constitutional:      General: She is not in acute distress. Cardiovascular:     Rate and Rhythm: Normal rate and regular rhythm.     Heart sounds: Normal heart sounds.  Pulmonary:     Effort: Pulmonary effort is normal.     Breath sounds: Normal breath sounds.  Abdominal:     General: Bowel sounds are normal.     Palpations: Abdomen is soft.  Skin:    General: Skin is warm and  dry.  Neurological:     Mental Status: She is alert and oriented to person, place, and time.    Breast exam was performed in seated and lying down position. Patient is status post right lumpectomy with  a well-healed surgical scar. No evidence of any palpable masses. No evidence of axillary adenopathy. No evidence of any palpable masses or lumps in the left breast. No evidence of leftt axillary adenopathy    CMP Latest Ref Rng & Units 10/12/2020  Glucose 70 - 99 mg/dL 135(H)  BUN 8 - 23 mg/dL 22  Creatinine 0.44 - 1.00 mg/dL 1.40(H)  Sodium 135 - 145 mmol/L 138  Potassium 3.5 - 5.1 mmol/L 4.3  Chloride 98 - 111 mmol/L 109  CO2 22 - 32 mmol/L 23  Calcium 8.9 - 10.3 mg/dL 9.1  Total Protein 6.5 - 8.1 g/dL -  Total Bilirubin 0.3 - 1.2 mg/dL -  Alkaline Phos 38 - 126 U/L -  AST 15 - 41 U/L -  ALT 0 - 44 U/L -   CBC Latest Ref Rng & Units 10/13/2020  WBC 4.0 - 10.5 K/uL 7.8  Hemoglobin 12.0 - 15.0 g/dL 10.2(L)  Hematocrit 36.0 - 46.0 % 29.0(L)  Platelets 150 - 400 K/uL 174     Assessment and plan- Patient is a 70 y.o. 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  She is tolerating Arimidex well without any significant side effects.She has lost about 7 pounds in the last 6 months.  We will continue to monitor and if there is any continued decline in her weight and if it does not stabilize we could consider getting additional scans.  She has an upcoming mammogram and bone density scan next week.  Plan is to continue Arimidex until January 2024  I will see her back in 6 months no labs   Visit Diagnosis 1. Encounter for follow-up surveillance of ductal carcinoma in situ (DCIS) of breast   2. Visit for monitoring Arimidex therapy      Dr. Randa Evens, MD, MPH [facility] [phone] 03/01/2021 3:26 PM

## 2021-03-03 ENCOUNTER — Other Ambulatory Visit: Payer: Self-pay | Admitting: Oncology

## 2021-03-04 ENCOUNTER — Inpatient Hospital Stay: Admission: RE | Admit: 2021-03-04 | Payer: BC Managed Care – PPO | Source: Ambulatory Visit

## 2021-03-18 ENCOUNTER — Ambulatory Visit
Admission: RE | Admit: 2021-03-18 | Discharge: 2021-03-18 | Disposition: A | Discharge status: Home / Self Care | Payer: BC Managed Care – PPO | Source: Ambulatory Visit | Attending: Surgery | Admitting: Surgery

## 2021-03-18 ENCOUNTER — Other Ambulatory Visit: Payer: Self-pay

## 2021-03-18 DIAGNOSIS — Z1231 Encounter for screening mammogram for malignant neoplasm of breast: Secondary | ICD-10-CM | POA: Insufficient documentation

## 2021-03-21 ENCOUNTER — Other Ambulatory Visit: Payer: Self-pay

## 2021-03-21 ENCOUNTER — Ambulatory Visit
Admission: RE | Admit: 2021-03-21 | Discharge: 2021-03-21 | Disposition: A | Discharge status: Home / Self Care | Payer: BC Managed Care – PPO | Source: Ambulatory Visit | Attending: Radiation Oncology | Admitting: Radiation Oncology

## 2021-03-21 ENCOUNTER — Ambulatory Visit: Payer: BC Managed Care – PPO | Admitting: Radiation Oncology

## 2021-03-21 ENCOUNTER — Encounter: Payer: Self-pay | Admitting: Radiation Oncology

## 2021-03-21 VITALS — BP 111/81 | HR 83 | Temp 96.0°F | Resp 16 | Wt 166.8 lb

## 2021-03-21 DIAGNOSIS — Z79811 Long term (current) use of aromatase inhibitors: Secondary | ICD-10-CM | POA: Insufficient documentation

## 2021-03-21 DIAGNOSIS — Z170 Estrogen receptor positive status [ER+]: Secondary | ICD-10-CM | POA: Diagnosis not present

## 2021-03-21 DIAGNOSIS — D0511 Intraductal carcinoma in situ of right breast: Secondary | ICD-10-CM | POA: Insufficient documentation

## 2021-03-21 DIAGNOSIS — Z923 Personal history of irradiation: Secondary | ICD-10-CM | POA: Diagnosis not present

## 2021-03-21 NOTE — Progress Notes (Signed)
Radiation Oncology Follow up Note  Name: Marissa Nelson   Date:   03/21/2021 MRN:  [id number] DOB: [birth year]    This 70 y.o. female presents to the clinic today for over 4-year follow-up status post whole breast radiation to her right breast for ER/PR positive ductal carcinoma in situ.  REFERRING PROVIDER: Delsa Grana, PA-C  HPI: Patient is a 70 year old female now out over 4 years having pleated whole breast radiation to her right breast for ER/PR positive ductal carcinoma in situ.  Seen today in routine follow-up she is doing well.  She specifically denies breast tenderness cough or bone pain.  She is currently on Arimidex tolerant at well without side effect..  She recently had mammograms which I have reviewed were BI-RADS 1 negative  COMPLICATIONS OF TREATMENT: none  FOLLOW UP COMPLIANCE: keeps appointments   PHYSICAL EXAM:  BP 111/81 (BP Location: Left Arm, Patient Position: Sitting)   Pulse 83   Temp (!) 96 F (35.6 C) (Tympanic)   Resp 16   Wt 166 lb 12.8 oz (75.7 kg)   BMI 29.55 kg/m  Lungs are clear to A&P cardiac examination essentially unremarkable with regular rate and rhythm. No dominant mass or nodularity is noted in either breast in 2 positions examined. Incision is well-healed. No axillary or supraclavicular adenopathy is appreciated. Cosmetic result is excellent.  Well-developed well-nourished patient in NAD. HEENT reveals PERLA, EOMI, discs not visualized.  Oral cavity is clear. No oral mucosal lesions are identified. Neck is clear without evidence of cervical or supraclavicular adenopathy. Lungs are clear to A&P. Cardiac examination is essentially unremarkable with regular rate and rhythm without murmur rub or thrill. Abdomen is benign with no organomegaly or masses noted. Motor sensory and DTR levels are equal and symmetric in the upper and lower extremities. Cranial nerves II through XII are grossly intact. Proprioception is intact. No peripheral adenopathy or  edema is identified. No motor or sensory levels are noted. Crude visual fields are within normal range.  RADIOLOGY RESULTS: Mammograms reviewed compatible with above-stated findings  PLAN: Present time patient continues to do well with no evidence of disease now out over 4 years.  I am pleased with her overall progress.  I am going to discontinue follow-up care.  I be happy to reevaluate the patient anytime should further treatment be indicated.  Patient knows to call with any concerns.  I would like to take this opportunity to thank you for allowing me to participate in the care of your patient.Noreene Filbert, MD

## 2021-03-26 ENCOUNTER — Other Ambulatory Visit: Payer: BC Managed Care – PPO

## 2021-03-27 ENCOUNTER — Ambulatory Visit: Payer: BC Managed Care – PPO | Admitting: Surgery

## 2021-04-09 ENCOUNTER — Encounter: Payer: Self-pay | Admitting: Internal Medicine

## 2021-04-09 ENCOUNTER — Ambulatory Visit (INDEPENDENT_AMBULATORY_CARE_PROVIDER_SITE_OTHER): Payer: BC Managed Care – PPO | Admitting: Internal Medicine

## 2021-04-09 VITALS — BP 122/82 | HR 92 | Temp 97.8°F | Resp 16 | Ht 63.0 in | Wt 167.5 lb

## 2021-04-09 DIAGNOSIS — N183 Chronic kidney disease, stage 3 unspecified: Secondary | ICD-10-CM

## 2021-04-09 DIAGNOSIS — R7303 Prediabetes: Secondary | ICD-10-CM | POA: Diagnosis not present

## 2021-04-09 DIAGNOSIS — E782 Mixed hyperlipidemia: Secondary | ICD-10-CM | POA: Diagnosis not present

## 2021-04-09 DIAGNOSIS — Z6835 Body mass index (BMI) 35.0-35.9, adult: Secondary | ICD-10-CM | POA: Diagnosis not present

## 2021-04-09 DIAGNOSIS — I10 Essential (primary) hypertension: Secondary | ICD-10-CM | POA: Diagnosis not present

## 2021-04-09 DIAGNOSIS — D631 Anemia in chronic kidney disease: Secondary | ICD-10-CM | POA: Diagnosis not present

## 2021-04-09 DIAGNOSIS — Z5181 Encounter for therapeutic drug level monitoring: Secondary | ICD-10-CM | POA: Diagnosis not present

## 2021-04-09 MED ORDER — TRIAMTERENE-HCTZ 37.5-25 MG PO TABS
1.0000 | ORAL_TABLET | Freq: Every day | ORAL | 3 refills | Status: DC
Start: 2021-04-09 — End: 2022-10-31

## 2021-04-09 NOTE — Progress Notes (Signed)
Established Patient Office Visit  Subjective:  Patient ID: Marissa Nelson, female    DOB: 14-Jun-1950  Age: 70 y.o. MRN: [id number]  CC:  Chief Complaint  Patient presents with   Follow-up   Hyperlipidemia   Hypertension    HPI Marissa Nelson presents for follow up on chronic medical conditions.   Hypertension: -Medications: Maxzide 37.5 - 25, Metoprolol 25, Torsemide 10 (takes as needed for swelling, about once a month) -Patient is compliant with above medications and reports no side effects. -Checking BP at home (average): 120-125/79-80 -Denies any SOB, CP, vision changes, LE edema or symptoms of hypotension  HLD: -Medications: Lipitor 20 mg -Patient is compliant with above medications and reports no side effects.  -Last lipid panel: 12/21 TC 201, HDL 64, triglercides 1423, LDL 111  Pre-Diabetes: -A1c last year 6.0% -Not currently on any medication  CKD3/Anemia Secondary to CKD: -Follows with Nephrology, seeing every 3 months, going back in January  -Last BNP 6/22 showing creatinine 1.40  Right Breast ductal carcinoma in situ: -Following with Rad/Onc, seeing every year  -Currently on Arimidex  -Most recent mammogram 11/22 - Birads-1  Health Maintenance: -Blood work due  -Colon cancer screening: colonoscopy 2018, recommendations repeating in 10 years   Past Medical History:  Diagnosis Date   Abnormal mammogram of right breast 01/17/2016   Recommendation: Six month follow-up diagnostic mammogram of the right breast for an additional group of probably benign Calcifications. Sept 2017 --> due March 2018   Arthralgia 10/04/2016   Arthritis    left hand   Breast cancer ([place]) 2018   right breast DCIS with rad tx   Breast cancer screening 11/15/2015   Chronic kidney disease (CKD), stage III (moderate) ([place]) 11/15/2015   Chronic left shoulder pain 11/08/2014   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   RIGHT, August 2018   Family history of diabetes  mellitus 06/12/2016   Mother and several other relatives   HLD (hyperlipidemia) 06/15/2015   Hx of right breast biopsy 01/17/2016   Hyperlipidemia    Hypertension    Medication monitoring encounter 11/15/2015   Motion sickness    ocean ships   Numbness and tingling in left hand 11/07/2014   Obesity 10/25/2015   Obesity (BMI 30.0-34.9) 10/25/2015   Personal history of radiation therapy 2018   right breast ca   PONV (postoperative nausea and vomiting)    patient stated she vomits a lot after anesthsia   Positive ANA (antinuclear antibody) 10/04/2016   Prediabetes    Preventative health care 11/15/2015   Vitamin D deficiency 10/29/2015    Past Surgical History:  Procedure Laterality Date   ABDOMINAL HYSTERECTOMY     BREAST BIOPSY Right 01/15/2016    cylinder shape calcs UOQ, FIBROADENOMATOUS   BREAST BIOPSY Right 12/30/2016   stereo bx for calcifications: DCIS UOQ, ribbon shaped   BREAST BIOPSY Right 01/08/2017   LOQ x shaped, FIBROADENOMATOUS CHANGE WITH   BREAST BIOPSY Left 03/09/2019   calcs bx, x marker neg   BREAST LUMPECTOMY Right 01/30/2018   DCIS clear margins   BREAST LUMPECTOMY WITH NEEDLE LOCALIZATION Right 01/30/2017   Procedure: BREAST LUMPECTOMY WITH NEEDLE LOCALIZATION;  Surgeon: Vickie Epley, MD;  Location: [facility];  Service: General;  Laterality: Right;   COLONOSCOPY WITH PROPOFOL N/A 11/19/2016   Procedure: COLONOSCOPY WITH PROPOFOL;  Surgeon: Jonathon Bellows, MD;  Location: [facility];  Service: Endoscopy;  Laterality: N/A;   ESOPHAGOGASTRODUODENOSCOPY (EGD) WITH PROPOFOL N/A 11/19/2016  Procedure: ESOPHAGOGASTRODUODENOSCOPY (EGD) WITH PROPOFOL;  Surgeon: Jonathon Bellows, MD;  Location: [facility];  Service: Endoscopy;  Laterality: N/A;   GIVENS CAPSULE STUDY N/A 12/12/2016   Procedure: GIVENS CAPSULE STUDY;  Surgeon: Jonathon Bellows, MD;  Location[facility];  Service: Gastroenterology;  Laterality: N/A;   TOTAL HIP ARTHROPLASTY Right 10/11/2020    Procedure: TOTAL HIP ARTHROPLASTY ANTERIOR APPROACH;  Surgeon: Hessie Knows, MD;  Location: [facility];  Service: Orthopedics;  Laterality: Right;   TOTAL MASTECTOMY Right 01/30/2017   Error    Family History  Problem Relation Age of Onset   Cancer Mother 108       liver cancer   Diabetes Mother    Hypertension Mother    Glomerulonephritis Father    Diabetes Sister    Glomerulonephritis Sister    Cancer Sister 86       throat cancer   Heart disease Sister    Diabetes Sister    Diabetes Sister    COPD Sister    Diabetes Sister    Breast cancer Cousin        Currently undergoing systemic chemotherapy (01/2017)   Varicose Veins Neg Hx     Social History   Socioeconomic History   Marital status: Married    Spouse name: Not on file   Number of children: 2   Years of education: Not on file   Highest education level: Not on file  Occupational History   Occupation: Works in [employer] ([place], [place])    Employer: [employer]  Tobacco Use   Smoking status: Never   Smokeless tobacco: Never  Vaping Use   Vaping Use: Never used  Substance and Sexual Activity   Alcohol use: No    Alcohol/week: 0.0 standard drinks   Drug use: No   Sexual activity: Not Currently  Other Topics Concern   Not on file  Social History Narrative   Not on file   Social Determinants of Health   Financial Resource Strain: Low Risk    Difficulty of Paying Living Expenses: Not hard at all  Food Insecurity: No Food Insecurity   Worried About Charity fundraiser in the Last Year: Never true   [place] in the Last Year: Never true  Transportation Needs: No Transportation Needs   Lack of Transportation (Medical): No   Lack of Transportation (Non-Medical): No  Physical Activity: Insufficiently Active   Days of Exercise per Week: 7 days   Minutes of Exercise per Session: 20 min  Stress: No Stress Concern Present   Feeling of Stress : Not at all  Social Connections:  Moderately Integrated   Frequency of Communication with Friends and Family: More than three times a week   Frequency of Social Gatherings with Friends and Family: More than three times a week   Attends Religious Services: More than 4 times per year   Active Member of [employer] or Organizations: No   Attends Archivist Meetings: Never   Marital Status: Married  Human resources officer Violence: Not At Risk   Fear of Current or Ex-Partner: No   Emotionally Abused: No   Physically Abused: No   Sexually Abused: No    Outpatient Medications Prior to Visit  Medication Sig Dispense Refill   acetaminophen (TYLENOL) 325 MG tablet Take 650 mg by mouth every 6 (six) hours as needed for mild pain or headache.      albuterol (VENTOLIN HFA) 108 (90 Base) MCG/ACT inhaler Inhale 2-4 puffs  by mouth every 4 hours as needed for wheezing, cough, and/or shortness of breath 8 g 1   anastrozole (ARIMIDEX) 1 MG tablet TAKE 1 TABLET BY MOUTH EVERY DAY 90 tablet 1   Ascorbic Acid (VITAMIN C) 1000 MG tablet Take 1,000 mg by mouth in the morning.     atorvastatin (LIPITOR) 20 MG tablet Take 1 tablet (20 mg total) by mouth at bedtime. 90 tablet 3   calcium carbonate (OS-CAL) 600 MG TABS tablet Take 600 mg by mouth daily with breakfast.     Cholecalciferol (VITAMIN D3) 50 MCG (2000 UT) TABS Take 2,000 Units by mouth daily.     Cyanocobalamin (VITAMIN B-12) 500 MCG SUBL Place 500 mcg under the tongue daily.     latanoprost (XALATAN) 0.005 % ophthalmic solution Place 1 drop into both eyes at bedtime.     metoprolol succinate (TOPROL-XL) 25 MG 24 hr tablet Take 1 tablet (25 mg total) by mouth daily. 90 tablet 3   torsemide (DEMADEX) 10 MG tablet Take 10 mg by mouth daily as needed (fluid).     triamterene-hydrochlorothiazide (MAXZIDE-25) 37.5-25 MG tablet Take 1 tablet by mouth daily. 90 tablet 3   No facility-administered medications prior to visit.    Allergies  Allergen Reactions   Penicillins Anaphylaxis    Shellfish Allergy Swelling    angioedema    ROS Review of Systems  Constitutional:  Positive for appetite change. Negative for chills, fever and unexpected weight change.  Respiratory:  Negative for cough and shortness of breath.   Cardiovascular:  Negative for chest pain.  Gastrointestinal:  Negative for abdominal pain, constipation and diarrhea.  Neurological:  Negative for dizziness and headaches.     Objective:    Physical Exam Constitutional:      Appearance: Normal appearance.  HENT:     Head: Normocephalic and atraumatic.  Eyes:     Conjunctiva/sclera: Conjunctivae normal.  Cardiovascular:     Rate and Rhythm: Normal rate and regular rhythm.  Pulmonary:     Effort: Pulmonary effort is normal.     Breath sounds: Normal breath sounds.  Abdominal:     General: There is no distension.     Palpations: Abdomen is soft.     Tenderness: There is no abdominal tenderness.  Musculoskeletal:     Right lower leg: No edema.     Left lower leg: No edema.  Skin:    General: Skin is warm and dry.  Neurological:     General: No focal deficit present.     Mental Status: She is alert. Mental status is at baseline.  Psychiatric:        Mood and Affect: Mood normal.        Behavior: Behavior normal.    BP 122/82   Pulse 92   Temp 97.8 F (36.6 C)   Resp 16   Ht 5\' 3"  (1.6 m)   Wt 167 lb 8 oz (76 kg)   SpO2 98%   BMI 29.67 kg/m  Wt Readings from Last 3 Encounters:  04/09/21 167 lb 8 oz (76 kg)  03/21/21 166 lb 12.8 oz (75.7 kg)  03/01/21 169 lb (76.7 kg)     Health Maintenance Due  Topic Date Due   Zoster Vaccines- Shingrix (1 of 2) Never done   COVID-19 Vaccine (3 - Moderna risk series) 08/13/2019    There are no preventive care reminders to display for this patient.  Lab Results  Component Value Date   TSH 2.000 02/21/2015  Lab Results  Component Value Date   WBC 7.8 10/13/2020   HGB 10.2 (L) 10/13/2020   HCT 29.0 (L) 10/13/2020   MCV 90.9 10/13/2020    PLT 174 10/13/2020   Lab Results  Component Value Date   NA 138 10/12/2020   K 4.3 10/12/2020   CO2 23 10/12/2020   GLUCOSE 135 (H) 10/12/2020   BUN 22 10/12/2020   CREATININE 1.40 (H) 10/12/2020   BILITOT 0.8 09/28/2020   ALKPHOS 85 09/28/2020   AST 18 09/28/2020   ALT 14 09/28/2020   PROT 7.8 09/28/2020   ALBUMIN 4.0 09/28/2020   CALCIUM 9.1 10/12/2020   ANIONGAP 6 10/12/2020   Lab Results  Component Value Date   CHOL 201 (H) 04/10/2020   Lab Results  Component Value Date   HDL 64 04/10/2020   Lab Results  Component Value Date   LDLCALC 111 (H) 04/10/2020   Lab Results  Component Value Date   TRIG 143 04/10/2020   Lab Results  Component Value Date   CHOLHDL 3.1 04/10/2020   Lab Results  Component Value Date   HGBA1C 6.0 (H) 04/10/2020      Assessment & Plan:   1. Benign essential HTN: Stable, medication refilled today. Check CBC, CMP. Follow up in 6 months or sooner as needed.   - COMPLETE METABOLIC PANEL WITH GFR - triamterene-hydrochlorothiazide (MAXZIDE-25) 37.5-25 MG tablet; Take 1 tablet by mouth daily.  Dispense: 90 tablet; Refill: 3  2. Mixed hyperlipidemia/Class 2 severe obesity with serious comorbidity and body mass index (BMI) of 35.0 to 35.9 in adult, unspecified obesity type (HCC)/Prediabetes: Stable, continue statin, recheck lipid panel and A1c today.  - Lipid panel - Hemoglobin A1C  3. Anemia in stage 3 chronic kidney disease, unspecified whether stage 3a or 3b CKD ([place]): Stable, following with Nephrology, recheck CMP, CBC today.  - CBC w/Diff/Platelet  Follow-up: Return in about 6 months (around 10/08/2021).    Teodora Medici, DO

## 2021-04-09 NOTE — Patient Instructions (Addendum)
It was great seeing you today!  Plan discussed at today's visit: -Blood work ordered today, results will be uploaded to [facility].  -Blood pressure medication refilled, continue to check at home  Follow up in: 6 months   Take care and let us know if you have any questions or concerns prior to your next visit.  Dr. Rosana Berger

## 2021-04-10 LAB — COMPLETE METABOLIC PANEL WITH GFR
AG Ratio: 1.4 (calc) (ref 1.0–2.5)
ALT: 13 U/L (ref 6–29)
AST: 17 U/L (ref 10–35)
Albumin: 4.1 g/dL (ref 3.6–5.1)
Alkaline phosphatase (APISO): 94 U/L (ref 37–153)
BUN/Creatinine Ratio: 23 (calc) — ABNORMAL HIGH (ref 6–22)
BUN: 38 mg/dL — ABNORMAL HIGH (ref 7–25)
CO2: 29 mmol/L (ref 20–32)
Calcium: 10 mg/dL (ref 8.6–10.4)
Chloride: 97 mmol/L — ABNORMAL LOW (ref 98–110)
Creat: 1.66 mg/dL — ABNORMAL HIGH (ref 0.60–1.00)
Globulin: 2.9 g/dL (calc) (ref 1.9–3.7)
Glucose, Bld: 79 mg/dL (ref 65–99)
Potassium: 4 mmol/L (ref 3.5–5.3)
Sodium: 137 mmol/L (ref 135–146)
Total Bilirubin: 0.3 mg/dL (ref 0.2–1.2)
Total Protein: 7 g/dL (ref 6.1–8.1)
eGFR: 33 mL/min/{1.73_m2} — ABNORMAL LOW (ref >=60)

## 2021-04-10 LAB — CBC WITH DIFFERENTIAL/PLATELET
Absolute Monocytes: 440 cells/uL (ref 200–950)
Basophils Absolute: 50 cells/uL (ref 0–200)
Basophils Relative: 0.8 %
Eosinophils Absolute: 50 cells/uL (ref 15–500)
Eosinophils Relative: 0.8 %
HCT: 34.8 % — ABNORMAL LOW (ref 35.0–45.0)
Hemoglobin: 11.7 g/dL (ref 11.7–15.5)
Lymphs Abs: 2102 cells/uL (ref 850–3900)
MCH: 31.3 pg (ref 27.0–33.0)
MCHC: 33.6 g/dL (ref 32.0–36.0)
MCV: 93 fL (ref 80.0–100.0)
MPV: 11.3 fL (ref 7.5–12.5)
Monocytes Relative: 7.1 %
Neutro Abs: 3559 cells/uL (ref 1500–7800)
Neutrophils Relative %: 57.4 %
Platelets: 275 10*3/uL (ref 140–400)
RBC: 3.74 10*6/uL — ABNORMAL LOW (ref 3.80–5.10)
RDW: 12.4 % (ref 11.0–15.0)
Total Lymphocyte: 33.9 %
WBC: 6.2 10*3/uL (ref 3.8–10.8)

## 2021-04-10 LAB — LIPID PANEL
Cholesterol: 223 mg/dL — ABNORMAL HIGH (ref <200)
HDL: 61 mg/dL (ref >=50)
LDL Cholesterol (Calc): 140 mg/dL (calc) — ABNORMAL HIGH
Non-HDL Cholesterol (Calc): 162 mg/dL (calc) — ABNORMAL HIGH (ref <130)
Total CHOL/HDL Ratio: 3.7 (calc) (ref <5.0)
Triglycerides: 104 mg/dL (ref <150)

## 2021-04-10 LAB — HEMOGLOBIN A1C
Hgb A1c MFr Bld: 6.2 % of total Hgb — ABNORMAL HIGH (ref <5.7)
Mean Plasma Glucose: 131 mg/dL
eAG (mmol/L): 7.3 mmol/L

## 2021-04-10 MED ORDER — ATORVASTATIN CALCIUM 40 MG PO TABS: 40.0000 mg | ORAL_TABLET | Freq: Every day | ORAL | 3 refills | Status: DC

## 2021-04-10 NOTE — Addendum Note (Signed)
Addended by: Teodora Medici on: 04/10/2021 08:03 AM   Modules accepted: Orders

## 2021-05-14 ENCOUNTER — Ambulatory Visit (INDEPENDENT_AMBULATORY_CARE_PROVIDER_SITE_OTHER): Payer: BC Managed Care – PPO | Admitting: Unknown Physician Specialty

## 2021-05-14 ENCOUNTER — Encounter: Payer: Self-pay | Admitting: Unknown Physician Specialty

## 2021-05-14 DIAGNOSIS — R7303 Prediabetes: Secondary | ICD-10-CM | POA: Diagnosis not present

## 2021-05-14 NOTE — Assessment & Plan Note (Signed)
Pt is here to discuss concerns as she heard she has diabets.  Hgb AiC discussed and she has pre-diabets.  She is familiar with diet and exercise and in fact, lost 25 pounds.  Discussed continued decrease in sugar and simple carbohydrates.

## 2021-05-14 NOTE — Progress Notes (Signed)
BP 130/82    Pulse 89    Temp 97.8 F (36.6 C) (Oral)    Resp 16    Ht '5\' 3"'  (1.6 m)    Wt 168 lb (76.2 kg)    SpO2 98%    BMI 29.76 kg/m    Subjective:    Patient ID: Marissa Nelson, female    DOB: [birth year], 71 y.o.   MRN: [id number]  HPI: Marissa Nelson is a 71 y.o. female  Chief Complaint  Patient presents with   Consult    Pt is concerned about DM, Pt states she was told she has DM due to her recent blood work. Pt would like to know what can she do to avoid it if possible.   Pt has a family history of diabetes.  She has lost a lot of weight with diet and exercise and is concerned.     Relevant past medical, surgical, family and social history reviewed and updated as indicated. Interim medical history since our last visit reviewed. Allergies and medications reviewed and updated.  Review of Systems  Constitutional: Negative.   HENT: Negative.    Eyes: Negative.   Respiratory: Negative.    Cardiovascular: Negative.   Gastrointestinal: Negative.   Endocrine: Negative.   Genitourinary: Negative.   Musculoskeletal: Negative.   Skin: Negative.   Allergic/Immunologic: Negative.   Neurological: Negative.   Hematological: Negative.   Psychiatric/Behavioral: Negative.     Per HPI unless specifically indicated above     Objective:    BP 130/82    Pulse 89    Temp 97.8 F (36.6 C) (Oral)    Resp 16    Ht '5\' 3"'  (1.6 m)    Wt 168 lb (76.2 kg)    SpO2 98%    BMI 29.76 kg/m   Wt Readings from Last 3 Encounters:  05/14/21 168 lb (76.2 kg)  04/09/21 167 lb 8 oz (76 kg)  03/21/21 166 lb 12.8 oz (75.7 kg)    Physical Exam Constitutional:      General: She is not in acute distress.    Appearance: Normal appearance. She is well-developed.  HENT:     Head: Normocephalic and atraumatic.  Eyes:     General: Lids are normal. No scleral icterus.       Right eye: No discharge.        Left eye: No discharge.     Conjunctiva/sclera: Conjunctivae normal.   Cardiovascular:     Rate and Rhythm: Normal rate.  Pulmonary:     Effort: Pulmonary effort is normal.  Abdominal:     Palpations: There is no hepatomegaly or splenomegaly.  Musculoskeletal:        General: Normal range of motion.  Skin:    Coloration: Skin is not pale.     Findings: No rash.  Neurological:     Mental Status: She is alert and oriented to person, place, and time.  Psychiatric:        Behavior: Behavior normal.        Thought Content: Thought content normal.        Judgment: Judgment normal.    Results for orders placed or performed in visit on 04/09/21  CBC w/Diff/Platelet  Result Value Ref Range   WBC 6.2 3.8 - 10.8 Thousand/uL   RBC 3.74 (L) 3.80 - 5.10 Million/uL   Hemoglobin 11.7 11.7 - 15.5 g/dL   HCT 34.8 (L) 35.0 - 45.0 %   MCV 93.0 80.0 - 100.0  fL   MCH 31.3 27.0 - 33.0 pg   MCHC 33.6 32.0 - 36.0 g/dL   RDW 12.4 11.0 - 15.0 %   Platelets 275 140 - 400 Thousand/uL   MPV 11.3 7.5 - 12.5 fL   Neutro Abs 3,559 1,500 - 7,800 cells/uL   Lymphs Abs 2,102 850 - 3,900 cells/uL   Absolute Monocytes 440 200 - 950 cells/uL   Eosinophils Absolute 50 15 - 500 cells/uL   Basophils Absolute 50 0 - 200 cells/uL   Neutrophils Relative % 57.4 %   Total Lymphocyte 33.9 %   Monocytes Relative 7.1 %   Eosinophils Relative 0.8 %   Basophils Relative 0.8 %  COMPLETE METABOLIC PANEL WITH GFR  Result Value Ref Range   Glucose, Bld 79 65 - 99 mg/dL   BUN 38 (H) 7 - 25 mg/dL   Creat 1.66 (H) 0.60 - 1.00 mg/dL   eGFR 33 (L) > OR = 60 mL/min/1.84m   BUN/Creatinine Ratio 23 (H) 6 - 22 (calc)   Sodium 137 135 - 146 mmol/L   Potassium 4.0 3.5 - 5.3 mmol/L   Chloride 97 (L) 98 - 110 mmol/L   CO2 29 20 - 32 mmol/L   Calcium 10.0 8.6 - 10.4 mg/dL   Total Protein 7.0 6.1 - 8.1 g/dL   Albumin 4.1 3.6 - 5.1 g/dL   Globulin 2.9 1.9 - 3.7 g/dL (calc)   AG Ratio 1.4 1.0 - 2.5 (calc)   Total Bilirubin 0.3 0.2 - 1.2 mg/dL   Alkaline phosphatase (APISO) 94 37 - 153 U/L   AST  17 10 - 35 U/L   ALT 13 6 - 29 U/L  Lipid panel  Result Value Ref Range   Cholesterol 223 (H) <200 mg/dL   HDL 61 > OR = 50 mg/dL   Triglycerides 104 <150 mg/dL   LDL Cholesterol (Calc) 140 (H) mg/dL (calc)   Total CHOL/HDL Ratio 3.7 <5.0 (calc)   Non-HDL Cholesterol (Calc) 162 (H) <130 mg/dL (calc)  Hemoglobin A1C  Result Value Ref Range   Hgb A1c MFr Bld 6.2 (H) <5.7 % of total Hgb   Mean Plasma Glucose 131 mg/dL   eAG (mmol/L) 7.3 mmol/L      Assessment & Plan:   Problem List Items Addressed This Visit       Unprioritized   Pre-diabetes    Pt is here to discuss concerns as she heard she has diabets.  Hgb AiC discussed and she has pre-diabets.  She is familiar with diet and exercise and in fact, lost 25 pounds.  Discussed continued decrease in sugar and simple carbohydrates.          Follow up plan: Return if symptoms worsen or fail to improve.

## 2021-06-03 DIAGNOSIS — R809 Proteinuria, unspecified: Secondary | ICD-10-CM | POA: Diagnosis not present

## 2021-06-03 DIAGNOSIS — I129 Hypertensive chronic kidney disease with stage 1 through stage 4 chronic kidney disease, or unspecified chronic kidney disease: Secondary | ICD-10-CM | POA: Diagnosis not present

## 2021-06-03 DIAGNOSIS — N1832 Chronic kidney disease, stage 3b: Secondary | ICD-10-CM | POA: Diagnosis not present

## 2021-06-03 DIAGNOSIS — D631 Anemia in chronic kidney disease: Secondary | ICD-10-CM | POA: Diagnosis not present

## 2021-06-11 ENCOUNTER — Ambulatory Visit
Admission: RE | Admit: 2021-06-11 | Discharge: 2021-06-11 | Disposition: A | Discharge status: Home / Self Care | Payer: BC Managed Care – PPO | Source: Ambulatory Visit | Attending: Oncology | Admitting: Oncology

## 2021-06-11 ENCOUNTER — Other Ambulatory Visit: Payer: Self-pay

## 2021-06-11 DIAGNOSIS — D0511 Intraductal carcinoma in situ of right breast: Secondary | ICD-10-CM | POA: Diagnosis not present

## 2021-06-11 DIAGNOSIS — Z13820 Encounter for screening for osteoporosis: Secondary | ICD-10-CM | POA: Insufficient documentation

## 2021-06-11 DIAGNOSIS — Z923 Personal history of irradiation: Secondary | ICD-10-CM | POA: Insufficient documentation

## 2021-06-11 DIAGNOSIS — Z780 Asymptomatic menopausal state: Secondary | ICD-10-CM | POA: Diagnosis not present

## 2021-06-26 DIAGNOSIS — M1611 Unilateral primary osteoarthritis, right hip: Secondary | ICD-10-CM | POA: Diagnosis not present

## 2021-06-26 DIAGNOSIS — Z96641 Presence of right artificial hip joint: Secondary | ICD-10-CM | POA: Diagnosis not present

## 2021-06-26 DIAGNOSIS — Z6835 Body mass index (BMI) 35.0-35.9, adult: Secondary | ICD-10-CM | POA: Diagnosis not present

## 2021-08-15 ENCOUNTER — Ambulatory Visit (INDEPENDENT_AMBULATORY_CARE_PROVIDER_SITE_OTHER): Payer: BC Managed Care – PPO | Admitting: Family Medicine

## 2021-08-15 ENCOUNTER — Ambulatory Visit: Payer: Self-pay | Admitting: *Deleted

## 2021-08-15 ENCOUNTER — Encounter: Payer: Self-pay | Admitting: Family Medicine

## 2021-08-15 VITALS — BP 102/60 | HR 84 | Temp 97.7°F | Resp 16 | Ht 61.0 in | Wt 170.9 lb

## 2021-08-15 DIAGNOSIS — R110 Nausea: Secondary | ICD-10-CM

## 2021-08-15 DIAGNOSIS — R1030 Lower abdominal pain, unspecified: Secondary | ICD-10-CM

## 2021-08-15 DIAGNOSIS — K625 Hemorrhage of anus and rectum: Secondary | ICD-10-CM

## 2021-08-15 DIAGNOSIS — R1013 Epigastric pain: Secondary | ICD-10-CM

## 2021-08-15 LAB — HEMOCCULT GUIAC POC 1CARD (OFFICE): Fecal Occult Blood, POC: NEGATIVE

## 2021-08-15 NOTE — Telephone Encounter (Signed)
Reason for Disposition ? MODERATE rectal bleeding (small blood clots, passing blood without stool, or toilet water turns red) ? ?Answer Assessment - Initial Assessment Questions ?1. APPEARANCE of BLOOD: "What color is it?" "Is it passed separately, on the surface of the stool, or mixed in with the stool?"  ?    Bright red blood- M/T, dark-W- unable to tell ?2. AMOUNT: "How much blood was passed?"  ?    Water was bloody ?3. FREQUENCY: "How many times has blood been passed with the stools?"  ?    Every time- one/day ?4. ONSET: "When was the blood first seen in the stools?" (Days or weeks)  ?    Started Monday ?5. DIARRHEA: "Is there also some diarrhea?" If Yes, ask: "How many diarrhea stools in the past 24 hours?"  ?    no ?6. CONSTIPATION: "Do you have constipation?" If Yes, ask: "How bad is it?" ?    no ?7. RECURRENT SYMPTOMS: "Have you had blood in your stools before?" If Yes, ask: "When was the last time?" and "What happened that time?"  ?    Years ago- hx ulcers ?8. BLOOD THINNERS: "Do you take any blood thinners?" (e.g., Coumadin/warfarin, Pradaxa/dabigatran, aspirin) ?    no ?9. OTHER SYMPTOMS: "Do you have any other symptoms?"  (e.g., abdomen pain, vomiting, dizziness, fever) ?    Abdominal pain-off/on- more this week- lower abdomen  ?10. PREGNANCY: "Is there any chance you are pregnant?" "When was your last menstrual period?" ? ?Protocols used: Rectal Bleeding-A-AH ? ?

## 2021-08-15 NOTE — Progress Notes (Signed)
? ? ?Patient ID: Marissa Nelson, female    DOB: Nov 26, 1950, 71 y.o.   MRN: [id number] ? ?PCP: Delsa Grana, PA-C ? ?Chief Complaint  ?Patient presents with  ? Abdominal Pain  ?  Lower bilateral abdominal pain  ? Rectal Bleeding  ?  Sx started on Monday, rectal bleeding was red and as days went by it became darker. Denies constipation  ? ? ?Subjective:  ? ?Marissa Nelson is a 71 y.o. female, presents to clinic with CC of the following: ? ?HPI ?Abd pain and BRBPR  ?Abd pain onset about 10 days ago, pain is located across her lower abdomen described as irritating and cramping.  While having the abdominal pain last week she had normal bowel movements and she did not have any fever sweats chills nausea or vomiting. ?3 days ago she had a bloody bowel movement she states that felt like a normal stool for her which she has once every morning Bristol category 4 stool, she did not have any pain constipation or straining, she states that the commode was full of blood and she also had bright red blood with wiping, she had about the same experience Tuesday, 2 days ago, yesterday her morning bowel movement the blood was slightly darker but still obscured the toilet, and this morning there was less blood and blood still remains slightly dark.  ?She denies any recent hemorrhoids, she has had some mild intermittent nausea which is not unusual for her with her oral chemo pill ?She has had slight epigastric pain and she does have a history of ulcers she is not experiencing any acid reflux indigestion or early fullness ?She has been able to work for the past 2 weeks and fulfill her duties she normally feels pretty tired when she gets home and she has not had any fatigue, exertional dyspnea, tachycardia, cold intolerance, near syncope, weakness.  ?She has had no recent sick contacts ? ?She last had a colonoscopy and upper endoscopy with Dr. Vicente Males about 5 years ago ?Reviewed her colonoscopy she had no history of polyps, had 2  nonbleeding internal hemorrhoids and she has a history of iron deficiency anemia however her last labs show normal hemoglobin ?Hemoglobin  ?Date Value Ref Range Status  ?04/09/2021 11.7 11.7 - 15.5 g/dL Final  ?10/13/2020 10.2 (L) 12.0 - 15.0 g/dL Final  ?10/12/2020 10.8 (L) 12.0 - 15.0 g/dL Final  ?10/11/2020 11.0 (L) 12.0 - 15.0 g/dL Final  ? ?She has not been taking any aspirin or NSAIDs ? ?Patient Active Problem List  ? Diagnosis Date Noted  ? S/P hip replacement 10/11/2020  ? Hyperuricemia 08/25/2019  ? Anemia in chronic kidney disease 05/25/2019  ? Benign hypertensive kidney disease with chronic kidney disease 05/25/2019  ? Vitamin B12 deficiency 05/07/2017  ? 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 Arthralgia 10/04/2016  ? Pre-diabetes 06/20/2016  ? Chronic kidney disease, stage III (moderate) ([place]) 11/15/2015  ? Vitamin D deficiency 10/29/2015  ? HLD (hyperlipidemia) 06/15/2015  ? Obesity (BMI 30.0-34.9) 06/15/2015  ? Chronic left shoulder pain 11/08/2014  ? Numbness and tingling in left hand 11/07/2014  ? Benign essential HTN 12/07/2006  ? ? ? ? ?Current Outpatient Medications:  ?  acetaminophen (TYLENOL) 325 MG tablet, Take 650 mg by mouth every 6 (six) hours as needed for mild pain or headache. , Disp: , Rfl:  ?  anastrozole (ARIMIDEX) 1 MG tablet, TAKE 1 TABLET BY MOUTH EVERY DAY, Disp: 90 tablet, Rfl: 1 ?  Ascorbic Acid (VITAMIN C) 1000 MG tablet, Take 1,000 mg by mouth in the morning., Disp: , Rfl:  ?  atorvastatin (LIPITOR) 20 MG tablet, Take 20 mg by mouth at bedtime., Disp: , Rfl:  ?  atorvastatin (LIPITOR) 40 MG tablet, Take 1 tablet (40 mg total) by mouth daily., Disp: 90 tablet, Rfl: 3 ?  calcium carbonate (OS-CAL) 600 MG TABS tablet, Take 600 mg by mouth daily with breakfast., Disp: , Rfl:  ?  Cholecalciferol (VITAMIN D3) 50 MCG (2000 UT) TABS, Take 2,000 Units by mouth daily., Disp: , Rfl:  ?  Cyanocobalamin (VITAMIN B-12) 500 MCG SUBL, Place 500 mcg under the tongue daily.,  Disp: , Rfl:  ?  latanoprost (XALATAN) 0.005 % ophthalmic solution, Place 1 drop into both eyes at bedtime., Disp: , Rfl:  ?  losartan (COZAAR) 25 MG tablet, Take 25 mg by mouth daily., Disp: , Rfl:  ?  metoprolol succinate (TOPROL-XL) 25 MG 24 hr tablet, Take 1 tablet (25 mg total) by mouth daily., Disp: 90 tablet, Rfl: 3 ?  torsemide (DEMADEX) 10 MG tablet, Take 10 mg by mouth daily as needed (fluid)., Disp: , Rfl:  ?  triamterene-hydrochlorothiazide (MAXZIDE-25) 37.5-25 MG tablet, Take 1 tablet by mouth daily., Disp: 90 tablet, Rfl: 3 ?  albuterol (VENTOLIN HFA) 108 (90 Base) MCG/ACT inhaler, Inhale 2-4 puffs by mouth every 4 hours as needed for wheezing, cough, and/or shortness of breath (Patient not taking: Reported on 05/14/2021), Disp: 8 g, Rfl: 1 ? ? ?Allergies  ?Allergen Reactions  ? Penicillins Anaphylaxis  ? Shellfish Allergy Swelling  ?  angioedema  ? ? ? ?Social History  ? ?Tobacco Use  ? Smoking status: Never  ? Smokeless tobacco: Never  ?Vaping Use  ? Vaping Use: Never used  ?Substance Use Topics  ? Alcohol use: No  ?  Alcohol/week: 0.0 standard drinks  ? Drug use: No  ?  ? ? ?Chart Review Today: ?I personally reviewed active problem list, medication list, allergies, family history, social history, health maintenance, notes from last encounter, lab results, imaging with the patient/caregiver today. ? ? ?Review of Systems  ?Constitutional: Negative.  Negative for activity change, appetite change, chills, diaphoresis, fatigue, fever and unexpected weight change.  ?Respiratory:  Negative for chest tightness and shortness of breath.   ?Cardiovascular:  Negative for chest pain, palpitations and leg swelling.  ?Gastrointestinal:  Positive for abdominal pain, blood in stool and nausea. Negative for abdominal distention, constipation, diarrhea, rectal pain and vomiting.  ?Musculoskeletal: Negative.   ?Skin: Negative.   ?All other systems reviewed and are negative. ? ?   ?Objective:  ? ?Vitals:  ? 08/15/21 1102   ?BP: 102/60  ?Pulse: 84  ?Resp: 16  ?Temp: 97.7 ?F (36.5 ?C)  ?TempSrc: Oral  ?SpO2: 100%  ?Weight: 170 lb 14.4 oz (77.5 kg)  ?Height: '5\' 1"'$  (1.549 m)  ?  ?Body mass index is 32.29 kg/m?. ? ?Physical Exam ?Vitals and nursing note reviewed.  ?Constitutional:   ?   General: She is not in acute distress. ?   Appearance: Normal appearance. She is well-developed and well-groomed. She is obese. She is not ill-appearing, toxic-appearing or diaphoretic.  ?HENT:  ?   Head: Normocephalic and atraumatic.  ?   Right Ear: External ear normal.  ?   Left Ear: External ear normal.  ?   Nose: Nose normal.  ?   Mouth/Throat:  ?   Pharynx: No oropharyngeal exudate.  ?Eyes:  ?   General: No scleral  icterus.    ?   Right eye: No discharge.     ?   Left eye: No discharge.  ?   Conjunctiva/sclera: Conjunctivae normal.  ?   Pupils: Pupils are equal, round, and reactive to light.  ?Neck:  ?   Trachea: No tracheal deviation.  ?Cardiovascular:  ?   Rate and Rhythm: Normal rate and regular rhythm.  ?   Heart sounds: Normal heart sounds. No murmur heard. ?  No friction rub. No gallop.  ?Pulmonary:  ?   Effort: Pulmonary effort is normal. No respiratory distress.  ?   Breath sounds: Normal breath sounds. No stridor. No wheezing or rales.  ?Abdominal:  ?   General: Bowel sounds are normal. There is no distension or abdominal bruit.  ?   Palpations: Abdomen is soft. There is no fluid wave, hepatomegaly, splenomegaly, mass or pulsatile mass.  ?   Tenderness: There is abdominal tenderness in the right lower quadrant, epigastric area, suprapubic area and left lower quadrant. There is no right CVA tenderness, left CVA tenderness, guarding or rebound.  ?   Hernia: No hernia is present.  ?   Comments: Soft obese abd ?Mild epigastric ttp ?And generalized low abd ttp w/o peritoneal signs  ?Genitourinary: ?   Rectum: Guaiac result negative. No mass, tenderness or external hemorrhoid. Normal anal tone.  ?   Comments: Anal skin tag - large, nontender, no  edema, no erythema ?No palpable internal masses or hemorrhoids ?Soft brown stool tested and hemoccult neg at bedtime ?Musculoskeletal:     ?   General: Normal range of motion.  ?   Cervical back: Normal rang

## 2021-08-15 NOTE — Telephone Encounter (Signed)
?  Chief Complaint: rectal bleeding ?Symptoms: rectal bleeding, abdominal pain ?Frequency: 3 days ?Pertinent Negatives: Patient denies vomiting, dizziness, fever ?Disposition: '[]'$ ED /'[]'$ Urgent Care (no appt availability in office) / '[x]'$ Appointment(In office/virtual)/ '[]'$  [facility]/ '[]'$ Home Care/ '[]'$ Refused Recommended Disposition /'[]'$ [facility] [facility]/ '[]'$  Follow-up with PCP ?Additional Notes:    ?

## 2021-08-15 NOTE — Addendum Note (Signed)
Addended by: Salomon Fick on: 08/15/2021 01:04 PM ? ? Modules accepted: Orders ? ?

## 2021-08-16 LAB — COMPLETE METABOLIC PANEL WITH GFR
AG Ratio: 1.4 (calc) (ref 1.0–2.5)
ALT: 18 U/L (ref 6–29)
AST: 18 U/L (ref 10–35)
Albumin: 4 g/dL (ref 3.6–5.1)
Alkaline phosphatase (APISO): 96 U/L (ref 37–153)
BUN/Creatinine Ratio: 15 (calc) (ref 6–22)
BUN: 22 mg/dL (ref 7–25)
CO2: 32 mmol/L (ref 20–32)
Calcium: 9.8 mg/dL (ref 8.6–10.4)
Chloride: 105 mmol/L (ref 98–110)
Creat: 1.45 mg/dL — ABNORMAL HIGH (ref 0.60–1.00)
Globulin: 2.9 g/dL (calc) (ref 1.9–3.7)
Glucose, Bld: 72 mg/dL (ref 65–99)
Potassium: 4.1 mmol/L (ref 3.5–5.3)
Sodium: 143 mmol/L (ref 135–146)
Total Bilirubin: 0.3 mg/dL (ref 0.2–1.2)
Total Protein: 6.9 g/dL (ref 6.1–8.1)
eGFR: 38 mL/min/{1.73_m2} — ABNORMAL LOW (ref >=60)

## 2021-08-16 LAB — CBC WITH DIFFERENTIAL/PLATELET
Absolute Monocytes: 479 cells/uL (ref 200–950)
Basophils Absolute: 51 cells/uL (ref 0–200)
Basophils Relative: 1 %
Eosinophils Absolute: 92 cells/uL (ref 15–500)
Eosinophils Relative: 1.8 %
HCT: 35.6 % (ref 35.0–45.0)
Hemoglobin: 11.8 g/dL (ref 11.7–15.5)
Lymphs Abs: 1637 cells/uL (ref 850–3900)
MCH: 31.5 pg (ref 27.0–33.0)
MCHC: 33.1 g/dL (ref 32.0–36.0)
MCV: 94.9 fL (ref 80.0–100.0)
MPV: 11 fL (ref 7.5–12.5)
Monocytes Relative: 9.4 %
Neutro Abs: 2841 cells/uL (ref 1500–7800)
Neutrophils Relative %: 55.7 %
Platelets: 235 10*3/uL (ref 140–400)
RBC: 3.75 10*6/uL — ABNORMAL LOW (ref 3.80–5.10)
RDW: 12.8 % (ref 11.0–15.0)
Total Lymphocyte: 32.1 %
WBC: 5.1 10*3/uL (ref 3.8–10.8)

## 2021-08-21 ENCOUNTER — Encounter: Payer: Self-pay | Admitting: Gastroenterology

## 2021-08-21 ENCOUNTER — Other Ambulatory Visit: Payer: Self-pay

## 2021-08-21 ENCOUNTER — Ambulatory Visit (INDEPENDENT_AMBULATORY_CARE_PROVIDER_SITE_OTHER): Payer: BC Managed Care – PPO | Admitting: Gastroenterology

## 2021-08-21 VITALS — BP 112/74 | HR 80 | Temp 98.1°F | Ht 61.0 in | Wt 168.0 lb

## 2021-08-21 DIAGNOSIS — K625 Hemorrhage of anus and rectum: Secondary | ICD-10-CM

## 2021-08-21 DIAGNOSIS — R6881 Early satiety: Secondary | ICD-10-CM

## 2021-08-21 MED ORDER — NA SULFATE-K SULFATE-MG SULF 17.5-3.13-1.6 GM/177ML PO SOLN: 354.0000 mL | Freq: Once | ORAL | 0 refills | Status: AC

## 2021-08-21 MED ORDER — OMEPRAZOLE 40 MG PO CPDR: 40.0000 mg | DELAYED_RELEASE_CAPSULE | Freq: Every day | ORAL | 0 refills | Status: DC

## 2021-08-21 NOTE — Addendum Note (Signed)
Addended by: Wayna Chalet on: 08/21/2021 02:52 PM ? ? Modules accepted: Orders ? ?

## 2021-08-21 NOTE — Progress Notes (Signed)
?  ?Jonathon Bellows MD, MRCP(U.K) ?[address]  ?[address]  ?[place], [place] [address]  ?Main: [phone]  ?Fax: [phone] ? ? ?Gastroenterology Consultation ? ?Referring Provider:     Delsa Grana, PA-C ?Primary Care Physician:  Delsa Grana, PA-C ?Primary Gastroenterologist:  Dr. Jonathon Bellows  ?Reason for Consultation:     Rectal bleeding  ?      ? HPI:   ?Marissa Nelson is a 71 y.o. y/o female referred for consultation & management  by Delsa Grana, PA-C.   ? ?Summary of history : ?I have previously seen her back in July 2018 at that time she was referred to me for anemia she had a normocytic anemia found to have low B12 levels I performed a colonoscopy in 2018 she had nonbleeding internal hemorrhoids.  I also performed an upper endoscopy at the same time which was normal.A capsule study of small bowel was also performed noted to have mild gastritis no active bleeding noted.  Previously iron studies ordered but not obtained. ? ?Presently she has been referred for rectal bleeding abdominal pain ? ?She states that a week back she started having painless rectal bleeding which occurred on a few episodes.  Denies any constipation.  Denies any perianal itching.  No change in bowel habits.  In addition she has had occasional lower abdominal cramping.  Last year she had rt hip replacement subsequently lost some weight but has not gained it back but also has not lost any further weight.  She has noticed recently she gets nauseous after eating a few bites.  Denies any heartburn.  Not on any PPI.  Feels full easily. ? ? ?08/15/2021 hemoglobin 11.8 g MCV of 94 ? ? ?Past Medical History:  ?Diagnosis Date  ? Abnormal mammogram of right breast 01/17/2016  ? Recommendation: Six month follow-up diagnostic mammogram of the right breast for an additional group of probably benign Calcifications. Sept 2017 --> due March 2018  ? Arthralgia 10/04/2016  ? Arthritis   ? left hand  ? Breast cancer ([place]) 2018  ? right breast DCIS  with rad tx  ? Breast cancer screening 11/15/2015  ? Chronic kidney disease (CKD), stage III (moderate) ([place]) 11/15/2015  ? Chronic left shoulder pain 11/08/2014  ? 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 RIGHT, August 2018  ? Family history of diabetes mellitus 06/12/2016  ? Mother and several other relatives  ? HLD (hyperlipidemia) 06/15/2015  ? Hx of right breast biopsy 01/17/2016  ? Hyperlipidemia   ? Hypertension   ? Medication monitoring encounter 11/15/2015  ? 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 Numbness and tingling in left hand 11/07/2014  ? Obesity 10/25/2015  ? Obesity (BMI 30.0-34.9) 10/25/2015  ? Personal history of radiation therapy 2018  ? right breast ca  ? PONV (postoperative nausea and vomiting)   ? patient stated she vomits a lot after anesthsia  ? Positive ANA (antinuclear antibody) 10/04/2016  ? Prediabetes   ? Preventative health care 11/15/2015  ? Vitamin D deficiency 10/29/2015  ? ? ?Past Surgical History:  ?Procedure Laterality Date  ? ABDOMINAL HYSTERECTOMY    ? BREAST BIOPSY Right 01/15/2016  ?  cylinder shape calcs UOQ, FIBROADENOMATOUS  ? BREAST BIOPSY Right 12/30/2016  ? stereo bx for calcifications: DCIS UOQ, ribbon shaped  ? BREAST BIOPSY Right 01/08/2017  ? LOQ x shaped, FIBROADENOMATOUS CHANGE WITH  ? BREAST BIOPSY Left 03/09/2019  ? calcs bx, x marker neg  ?  BREAST LUMPECTOMY Right 01/30/2018  ? DCIS clear margins  ? BREAST LUMPECTOMY WITH NEEDLE LOCALIZATION Right 01/30/2017  ? Procedure: BREAST LUMPECTOMY WITH NEEDLE LOCALIZATION;  Surgeon: Vickie Epley, MD;  Location: [facility];  Service: General;  Laterality: Right;  ? COLONOSCOPY WITH PROPOFOL N/A 11/19/2016  ? Procedure: COLONOSCOPY WITH PROPOFOL;  Surgeon: Jonathon Bellows, MD;  Location: [facility];  Service: Endoscopy;  Laterality: N/A;  ? ESOPHAGOGASTRODUODENOSCOPY (EGD) WITH PROPOFOL N/A 11/19/2016  ? Procedure: ESOPHAGOGASTRODUODENOSCOPY (EGD) WITH PROPOFOL;  Surgeon: Jonathon Bellows, MD;  Location: [facility];  Service: Endoscopy;  Laterality: N/A;  ? GIVENS CAPSULE STUDY N/A 12/12/2016  ? Procedure: GIVENS CAPSULE STUDY;  Surgeon: Jonathon Bellows, MD;  Location: [facility] END[facility];  Service: Gastroenterology;  Laterality: N/A;  ? TOTAL HIP ARTHROPLASTY Right 10/11/2020  ? Procedure: TOTAL HIP ARTHROPLASTY ANTERIOR APPROACH;  Surgeon: Hessie Knows, MD;  Location: [facility];  Service: Orthopedics;  Laterality: Right;  ? TOTAL MASTECTOMY Right 01/30/2017  ? Error  ? ? ?Prior to Admission medications   ?Medication Sig Start Date End Date Taking? Authorizing Provider  ?acetaminophen (TYLENOL) 325 MG tablet Take 650 mg by mouth every 6 (six) hours as needed for mild pain or headache.    Yes [provider]  ?albuterol (VENTOLIN HFA) 108 (90 Base) MCG/ACT inhaler Inhale 2-4 puffs by mouth every 4 hours as needed for wheezing, cough, and/or shortness of breath 05/07/19  Yes Hinda Kehr, MD  ?anastrozole (ARIMIDEX) 1 MG tablet TAKE 1 TABLET BY MOUTH EVERY DAY 03/03/21  Yes Sindy Guadeloupe, MD  ?Ascorbic Acid (VITAMIN C) 1000 MG tablet Take 1,000 mg by mouth in the morning.   Yes [provider]  ?atorvastatin (LIPITOR) 20 MG tablet Take 20 mg by mouth at bedtime. 04/12/21  Yes [provider]  ?atorvastatin (LIPITOR) 40 MG tablet Take 1 tablet (40 mg total) by mouth daily. 04/10/21  Yes Teodora Medici, DO  ?calcium carbonate (OS-CAL) 600 MG TABS tablet Take 600 mg by mouth daily with breakfast.   Yes [provider]  ?Cholecalciferol (VITAMIN D3) 50 MCG (2000 UT) TABS Take 2,000 Units by mouth daily.   Yes [provider]  ?Cyanocobalamin (VITAMIN B-12) 500 MCG SUBL Place 500 mcg under the tongue daily. 05/07/17  Yes Lada, Satira Anis, MD  ?latanoprost (XALATAN) 0.005 % ophthalmic solution Place 1 drop into both eyes at bedtime. 04/23/20  Yes [provider]  ?losartan (COZAAR) 25 MG tablet Take 25 mg by mouth daily. 06/03/21  Yes [provider]  ?metoprolol succinate  (TOPROL-XL) 25 MG 24 hr tablet Take 1 tablet (25 mg total) by mouth daily. 10/08/20  Yes Kathrine Haddock, NP  ?torsemide (DEMADEX) 10 MG tablet Take 10 mg by mouth daily as needed (fluid). 09/18/20  Yes [provider]  ?triamterene-hydrochlorothiazide (MAXZIDE-25) 37.5-25 MG tablet Take 1 tablet by mouth daily. 04/09/21  Yes Teodora Medici, DO  ? ? ?Family History  ?Problem Relation Age of Onset  ? Cancer Mother 87  ?     liver cancer  ? Diabetes Mother   ? Hypertension Mother   ? Glomerulonephritis Father   ? Diabetes Sister   ? Glomerulonephritis Sister   ? Cancer Sister 35  ?     throat cancer  ? Heart disease Sister   ? Diabetes Sister   ? Diabetes Sister   ? COPD Sister   ? Diabetes Sister   ? Breast cancer Cousin   ?     Currently undergoing  systemic chemotherapy (01/2017)  ? Varicose Veins Neg Hx   ?  ? ?Social History  ? ?Tobacco Use  ? Smoking status: Never  ? Smokeless tobacco: Never  ?Vaping Use  ? Vaping Use: Never used  ?Substance Use Topics  ? Alcohol use: No  ?  Alcohol/week: 0.0 standard drinks  ? Drug use: No  ? ? ?Allergies as of 08/21/2021 - Review Complete 08/21/2021  ?Allergen Reaction Noted  ? Penicillins Anaphylaxis 05/07/2019  ? Shellfish allergy Swelling 11/13/2016  ? ? ?Review of Systems:    ?All systems reviewed and negative except where noted in HPI. ? ? Physical Exam:  ?BP 112/74   Pulse 80   Temp 98.1 ?F (36.7 ?C) (Oral)   Ht '5\' 1"'$  (1.549 m)   Wt 168 lb (76.2 kg)   BMI 31.74 kg/m?  ?No LMP recorded. Patient has had a hysterectomy. ?Psych:  Alert and cooperative. Normal mood and affect. ?General:   Alert,  Well-developed, well-nourished, pleasant and cooperative in NAD ?Head:  Normocephalic and atraumatic. ?Eyes:  Sclera clear, no icterus.   Conjunctiva pink. ?Ears:  Normal auditory acuity. ?Neurologic:  Alert and oriented x3;  grossly normal neurologically. ?Psych:  Alert and cooperative. Normal mood and affect. ? ?Imaging Studies: ?No results found. ? ?Assessment and Plan:   ? ?Marissa Nelson is a 71 y.o. y/o female has been referred for rectal bleeding that has been painless.  Likely hemorrhoidal.  Last colonoscopy was 5 years back.  We will repeat a colonoscopy.  She also has be

## 2021-08-23 LAB — H. PYLORI BREATH TEST: H pylori Breath Test: NEGATIVE

## 2021-08-27 ENCOUNTER — Other Ambulatory Visit: Payer: Self-pay

## 2021-08-27 MED ORDER — NA SULFATE-K SULFATE-MG SULF 17.5-3.13-1.6 GM/177ML PO SOLN: 354.0000 mL | Freq: Once | ORAL | 0 refills | Status: AC

## 2021-08-28 ENCOUNTER — Ambulatory Visit: Payer: BC Managed Care – PPO | Admitting: Certified Registered Nurse Anesthetist

## 2021-08-28 ENCOUNTER — Encounter
Admission: RE | Disposition: A | Discharge status: Home / Self Care | Payer: Self-pay | Source: Home / Self Care | Attending: Gastroenterology

## 2021-08-28 ENCOUNTER — Ambulatory Visit
Admission: RE | Admit: 2021-08-28 | Discharge: 2021-08-28 | Disposition: A | Discharge status: Home / Self Care | Payer: BC Managed Care – PPO | Attending: Gastroenterology | Admitting: Gastroenterology

## 2021-08-28 ENCOUNTER — Encounter: Payer: Self-pay | Admitting: Gastroenterology

## 2021-08-28 DIAGNOSIS — K319 Disease of stomach and duodenum, unspecified: Secondary | ICD-10-CM | POA: Insufficient documentation

## 2021-08-28 DIAGNOSIS — N183 Chronic kidney disease, stage 3 unspecified: Secondary | ICD-10-CM | POA: Insufficient documentation

## 2021-08-28 DIAGNOSIS — I129 Hypertensive chronic kidney disease with stage 1 through stage 4 chronic kidney disease, or unspecified chronic kidney disease: Secondary | ICD-10-CM | POA: Insufficient documentation

## 2021-08-28 DIAGNOSIS — K649 Unspecified hemorrhoids: Secondary | ICD-10-CM | POA: Diagnosis not present

## 2021-08-28 DIAGNOSIS — Z79899 Other long term (current) drug therapy: Secondary | ICD-10-CM | POA: Insufficient documentation

## 2021-08-28 DIAGNOSIS — K640 First degree hemorrhoids: Secondary | ICD-10-CM | POA: Insufficient documentation

## 2021-08-28 DIAGNOSIS — Z6832 Body mass index (BMI) 32.0-32.9, adult: Secondary | ICD-10-CM | POA: Insufficient documentation

## 2021-08-28 DIAGNOSIS — R110 Nausea: Secondary | ICD-10-CM

## 2021-08-28 DIAGNOSIS — K625 Hemorrhage of anus and rectum: Secondary | ICD-10-CM | POA: Diagnosis not present

## 2021-08-28 DIAGNOSIS — E669 Obesity, unspecified: Secondary | ICD-10-CM | POA: Insufficient documentation

## 2021-08-28 DIAGNOSIS — E785 Hyperlipidemia, unspecified: Secondary | ICD-10-CM | POA: Diagnosis not present

## 2021-08-28 HISTORY — PX: ESOPHAGOGASTRODUODENOSCOPY: SHX5428

## 2021-08-28 HISTORY — PX: COLONOSCOPY WITH PROPOFOL: SHX5780

## 2021-08-28 SURGERY — COLONOSCOPY WITH PROPOFOL
Anesthesia: General

## 2021-08-28 MED ORDER — SODIUM CHLORIDE 0.9 % IV SOLN: INTRAVENOUS | Status: DC

## 2021-08-28 MED ORDER — PROPOFOL 500 MG/50ML IV EMUL
INTRAVENOUS | Status: AC
  Filled 2021-08-28: qty 50

## 2021-08-28 MED ORDER — STERILE WATER FOR IRRIGATION IR SOLN
Status: DC | PRN
  Administered 2021-08-28: 60 mL

## 2021-08-28 MED ORDER — LIDOCAINE HCL (PF) 2 % IJ SOLN
INTRAMUSCULAR | Status: AC
  Filled 2021-08-28: qty 5

## 2021-08-28 MED ORDER — ONDANSETRON HCL 4 MG/2ML IJ SOLN
INTRAMUSCULAR | Status: AC
  Filled 2021-08-28: qty 2

## 2021-08-28 MED ORDER — PROPOFOL 500 MG/50ML IV EMUL
INTRAVENOUS | Status: DC | PRN
  Administered 2021-08-28: 150 ug/kg/min via INTRAVENOUS

## 2021-08-28 MED ORDER — PROPOFOL 10 MG/ML IV BOLUS
INTRAVENOUS | Status: DC | PRN
  Administered 2021-08-28: 80 mg via INTRAVENOUS

## 2021-08-28 MED ORDER — LIDOCAINE HCL (CARDIAC) PF 100 MG/5ML IV SOSY
PREFILLED_SYRINGE | INTRAVENOUS | Status: DC | PRN
Start: 2021-08-28 — End: 2021-08-28
  Administered 2021-08-28: 50 mg via INTRAVENOUS

## 2021-08-28 MED ORDER — ONDANSETRON HCL 4 MG/2ML IJ SOLN
INTRAMUSCULAR | Status: DC | PRN
  Administered 2021-08-28: 4 mg via INTRAVENOUS

## 2021-08-28 NOTE — Anesthesia Procedure Notes (Signed)
Date/Time: 08/28/2021 1:24 PM ?Performed by: Johnna Acosta, CRNA ?Pre-anesthesia Checklist: Patient identified, Emergency Drugs available, Suction available, Patient being monitored and Timeout performed ?Patient Re-evaluated:Patient Re-evaluated prior to induction ?Oxygen Delivery Method: Nasal cannula ?Preoxygenation: Pre-oxygenation with 100% oxygen ?Induction Type: IV induction ? ? ? ? ?

## 2021-08-28 NOTE — Anesthesia Postprocedure Evaluation (Signed)
Anesthesia Post Note ? ?Patient: Marissa Nelson ? ?Procedure(s) Performed: COLONOSCOPY WITH PROPOFOL ?ESOPHAGOGASTRODUODENOSCOPY (EGD) ? ?Patient location during evaluation: Endoscopy ?Anesthesia Type: General ?Level of consciousness: awake and alert ?Pain management: pain level controlled ?Vital Signs Assessment: post-procedure vital signs reviewed and stable ?Respiratory status: spontaneous breathing, nonlabored ventilation, respiratory function stable and patient connected to nasal cannula oxygen ?Cardiovascular status: blood pressure returned to baseline and stable ?Postop Assessment: no apparent nausea or vomiting ?Anesthetic complications: no ? ? ?No notable events documented. ? ? ?Last Vitals:  ?Vitals:  ? 08/28/21 1351 08/28/21 1352  ?BP: (!) 99/58 (!) 99/58  ?Pulse:  63  ?Resp:  12  ?Temp: 36.7 ?C   ?SpO2:  100%  ?  ?Last Pain:  ?Vitals:  ? 08/28/21 1351  ?TempSrc: Temporal  ? ? ?  ?  ?  ?  ?  ?  ? ?Arita Miss ? ? ? ? ?

## 2021-08-28 NOTE — Anesthesia Preprocedure Evaluation (Signed)
Anesthesia Evaluation  ?Patient identified by MRN, date of birth, ID band ?Patient awake ? ? ? ?Reviewed: ?Allergy & Precautions, NPO status , Patient's Chart, lab work & pertinent test results ? ?History of Anesthesia Complications ?(+) PONV and history of anesthetic complications ? ?Airway ?Mallampati: III ? ?TM Distance: <3 FB ?Neck ROM: limited ? ? ? Dental ? ?(+) Chipped ?  ?Pulmonary ?neg pulmonary ROS, neg shortness of breath,  ?  ?Pulmonary exam normal ? ? ? ? ? ? ? Cardiovascular ?Exercise Tolerance: Good ?hypertension, (-) angina(-) Past MI and (-) DOE Normal cardiovascular exam ? ? ?  ?Neuro/Psych ?negative neurological ROS ? negative psych ROS  ? GI/Hepatic ?negative GI ROS, Neg liver ROS,   ?Endo/Other  ?negative endocrine ROS ? Renal/GU ?Renal disease  ? ?  ?Musculoskeletal ? ?(+) Arthritis ,  ? Abdominal ?  ?Peds ? Hematology ?negative hematology ROS ?(+)   ?Anesthesia Other Findings ?Past Medical History: ?01/17/2016: Abnormal mammogram of right breast ?    Comment:  Recommendation: Six month follow-up diagnostic mammogram ?             of the right breast for an additional group of probably  ?             benign Calcifications. Sept 2017 --> due March 2018 ?10/04/2016: Arthralgia ?No date: Arthritis ?    Comment:  left hand ?2018: Breast cancer ([place]) ?    Comment:  right breast DCIS with rad tx ?11/15/2015: Breast cancer screening ?11/15/2015: Chronic kidney disease (CKD), stage III (moderate) ([place]) ?11/08/2014: Chronic left shoulder pain ?12/31/2016: DCIS (ductal carcinoma in situ) of breast ?    Comment:  RIGHT, August 2018 ?06/12/2016: Family history of diabetes mellitus ?    Comment:  Mother and several other relatives ?06/15/2015: HLD (hyperlipidemia) ?01/17/2016: Hx of right breast biopsy ?No date: Hyperlipidemia ?No date: Hypertension ?11/15/2015: Medication monitoring encounter ?No date: Motion sickness ?    Comment:  ocean ships ?11/07/2014: Numbness and tingling in left  hand ?10/25/2015: Obesity ?10/25/2015: Obesity (BMI 30.0-34.9) ?2018: Personal history of radiation therapy ?    Comment:  right breast ca ?No date: PONV (postoperative nausea and vomiting) ?    Comment:  patient stated she vomits a lot after anesthsia ?10/04/2016: Positive ANA (antinuclear antibody) ?No date: Prediabetes ?11/15/2015: Preventative health care ?10/29/2015: Vitamin D deficiency ? ?Past Surgical History: ?No date: ABDOMINAL HYSTERECTOMY ?01/15/2016: BREAST BIOPSY; Right ?    Comment:   cylinder shape calcs UOQ, FIBROADENOMATOUS ?12/30/2016: BREAST BIOPSY; Right ?    Comment:  stereo bx for calcifications: DCIS UOQ, ribbon shaped ?01/08/2017: BREAST BIOPSY; Right ?    Comment:  LOQ x shaped, FIBROADENOMATOUS CHANGE WITH ?03/09/2019: BREAST BIOPSY; Left ?    Comment:  calcs bx, x marker path pending ?01/30/2018: BREAST LUMPECTOMY; Right ?    Comment:  DCIS clear margins ?01/30/2017: BREAST LUMPECTOMY WITH NEEDLE LOCALIZATION; Right ?    Comment:  Procedure: BREAST LUMPECTOMY WITH NEEDLE LOCALIZATION;   ?             Surgeon: Vickie Epley, MD;  Location: [facility];   ?             Service: General;  Laterality: Right; ?11/19/2016: COLONOSCOPY WITH PROPOFOL; N/A ?    Comment:  Procedure: COLONOSCOPY WITH PROPOFOL;  Surgeon: Vicente Males,  ?             Bailey Mech, MD;  Location: [place];  Service:  ?  Endoscopy;  Laterality: N/A; ?11/19/2016: ESOPHAGOGASTRODUODENOSCOPY (EGD) WITH PROPOFOL; N/A ?    Comment:  Procedure: ESOPHAGOGASTRODUODENOSCOPY (EGD) WITH  ?             PROPOFOL;  Surgeon: Jonathon Bellows, MD;  Location[facility]  ?             [facility];  Service: Endoscopy;  Laterality: N/A; ?12/12/2016: GIVENS CAPSULE STUDY; N/A ?    Comment:  Procedure: GIVENS CAPSULE STUDY;  Surgeon: Jonathon Bellows,  ?             MD;  Location: [facility] ENDOSCOPY;  Service:  ?             Gastroenterology;  Laterality: N/A; ?01/30/2017: TOTAL MASTECTOMY; Right ?    Comment:  Error ? ?BMI   ? Body Mass Index: 32.37 kg/m?  ?   ? ? Reproductive/Obstetrics ?negative OB ROS ? ?  ? ? ? ? ? ? ? ? ? ? ? ? ? ?  ?  ? ? ? ? ? ? ? ? ?Anesthesia Physical ? ?Anesthesia Plan ? ?ASA: 3 ? ?Anesthesia Plan: General  ? ?Post-op Pain Management: Minimal or no pain anticipated  ? ?Induction: Intravenous ? ?PONV Risk Score and Plan: 4 or greater and Propofol infusion, TIVA and Ondansetron ? ?Airway Management Planned: Natural Airway and Nasal Cannula ? ?Additional Equipment: None ? ?Intra-op Plan:  ? ?Post-operative Plan:  ? ?Informed Consent: I have reviewed the patients History and Physical, chart, labs and discussed the procedure including the risks, benefits and alternatives for the proposed anesthesia with the patient or authorized representative who has indicated his/her understanding and acceptance.  ? ? ? ?Dental Advisory Given ? ?Plan Discussed with: Anesthesiologist, CRNA and Surgeon ? ?Anesthesia Plan Comments: (Discussed risks of anesthesia with patient, including possibility of difficulty with spontaneous ventilation under anesthesia necessitating airway intervention, PONV, and rare risks such as cardiac or respiratory or neurological events, and allergic reactions. Discussed the role of CRNA in patient's perioperative care. Patient understands.)  ? ? ? ? ? ? ?Anesthesia Quick Evaluation ? ?

## 2021-08-28 NOTE — H&P (Signed)
? ? ? ?Jonathon Bellows, MD ?[address] [place], [place], [place], [address] ?[address], [place], [place], [place], [address] ?Phone: [phone]  ?Fax: [phone] ? ?Primary Care Physician:  Delsa Grana, PA-C ? ? ?Pre-Procedure History & Physical: ?HPI:  Marissa Nelson is a 71 y.o. female is here for an endoscopy and colonoscopy  ?  ?Past Medical History:  ?Diagnosis Date  ? Abnormal mammogram of right breast 01/17/2016  ? Recommendation: Six month follow-up diagnostic mammogram of the right breast for an additional group of probably benign Calcifications. Sept 2017 --> due March 2018  ? Arthralgia 10/04/2016  ? Arthritis   ? left hand  ? Breast cancer ([place]) 2018  ? right breast DCIS with rad tx  ? Breast cancer screening 11/15/2015  ? Chronic kidney disease (CKD), stage III (moderate) ([place]) 11/15/2015  ? Chronic left shoulder pain 11/08/2014  ? 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 RIGHT, August 2018  ? Family history of diabetes mellitus 06/12/2016  ? Mother and several other relatives  ? HLD (hyperlipidemia) 06/15/2015  ? Hx of right breast biopsy 01/17/2016  ? Hyperlipidemia   ? Hypertension   ? Medication monitoring encounter 11/15/2015  ? 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 Numbness and tingling in left hand 11/07/2014  ? Obesity 10/25/2015  ? Obesity (BMI 30.0-34.9) 10/25/2015  ? Personal history of radiation therapy 2018  ? right breast ca  ? PONV (postoperative nausea and vomiting)   ? patient stated she vomits a lot after anesthsia  ? Positive ANA (antinuclear antibody) 10/04/2016  ? Prediabetes   ? Preventative health care 11/15/2015  ? Vitamin D deficiency 10/29/2015  ? ? ?Past Surgical History:  ?Procedure Laterality Date  ? ABDOMINAL HYSTERECTOMY    ? BREAST BIOPSY Right 01/15/2016  ?  cylinder shape calcs UOQ, FIBROADENOMATOUS  ? BREAST BIOPSY Right 12/30/2016  ? stereo bx for calcifications: DCIS UOQ, ribbon shaped  ? BREAST BIOPSY Right 01/08/2017  ? LOQ x shaped, FIBROADENOMATOUS CHANGE  WITH  ? BREAST BIOPSY Left 03/09/2019  ? calcs bx, x marker neg  ? BREAST LUMPECTOMY Right 01/30/2018  ? DCIS clear margins  ? BREAST LUMPECTOMY WITH NEEDLE LOCALIZATION Right 01/30/2017  ? Procedure: BREAST LUMPECTOMY WITH NEEDLE LOCALIZATION;  Surgeon: Vickie Epley, MD;  Location: [facility];  Service: General;  Laterality: Right;  ? COLONOSCOPY WITH PROPOFOL N/A 11/19/2016  ? Procedure: COLONOSCOPY WITH PROPOFOL;  Surgeon: Jonathon Bellows, MD;  Location: [facility];  Service: Endoscopy;  Laterality: N/A;  ? ESOPHAGOGASTRODUODENOSCOPY (EGD) WITH PROPOFOL N/A 11/19/2016  ? Procedure: ESOPHAGOGASTRODUODENOSCOPY (EGD) WITH PROPOFOL;  Surgeon: Jonathon Bellows, MD;  Location: [facility];  Service: Endoscopy;  Laterality: N/A;  ? GIVENS CAPSULE STUDY N/A 12/12/2016  ? Procedure: GIVENS CAPSULE STUDY;  Surgeon: Jonathon Bellows, MD;  Location: [facility];  Service: Gastroenterology;  Laterality: N/A;  ? TOTAL HIP ARTHROPLASTY Right 10/11/2020  ? Procedure: TOTAL HIP ARTHROPLASTY ANTERIOR APPROACH;  Surgeon: Hessie Knows, MD;  Location: [facility];  Service: Orthopedics;  Laterality: Right;  ? TOTAL MASTECTOMY Right 01/30/2017  ? Error  ? ? ?Prior to Admission medications   ?Medication Sig Start Date End Date Taking? Authorizing Provider  ?albuterol (VENTOLIN HFA) 108 (90 Base) MCG/ACT inhaler Inhale 2-4 puffs by mouth every 4 hours as needed for wheezing, cough, and/or shortness of breath 05/07/19  Yes Hinda Kehr, MD  ?anastrozole (ARIMIDEX) 1 MG tablet TAKE 1 TABLET BY MOUTH EVERY DAY 03/03/21  Yes Sindy Guadeloupe, MD  ?atorvastatin (LIPITOR) 20 MG tablet Take 20 mg by mouth at bedtime. 04/12/21  Yes [provider]  ?calcium carbonate (OS-CAL) 600 MG TABS tablet Take 600 mg by mouth daily with breakfast.   Yes [provider]  ?latanoprost (XALATAN) 0.005 % ophthalmic solution Place 1 drop into both eyes at bedtime. 04/23/20  Yes [provider]  ?losartan (COZAAR) 25 MG tablet Take  25 mg by mouth daily. 06/03/21  Yes [provider]  ?metoprolol succinate (TOPROL-XL) 25 MG 24 hr tablet Take 1 tablet (25 mg total) by mouth daily. 10/08/20  Yes Kathrine Haddock, NP  ?omeprazole (PRILOSEC) 40 MG capsule Take 1 capsule (40 mg total) by mouth daily. 08/21/21  Yes Jonathon Bellows, MD  ?torsemide (DEMADEX) 10 MG tablet Take 10 mg by mouth daily as needed (fluid). 09/18/20  Yes [provider]  ?triamterene-hydrochlorothiazide (MAXZIDE-25) 37.5-25 MG tablet Take 1 tablet by mouth daily. 04/09/21  Yes Teodora Medici, DO  ?acetaminophen (TYLENOL) 325 MG tablet Take 650 mg by mouth every 6 (six) hours as needed for mild pain or headache.     [provider]  ?Ascorbic Acid (VITAMIN C) 1000 MG tablet Take 1,000 mg by mouth in the morning.    [provider]  ?atorvastatin (LIPITOR) 40 MG tablet Take 1 tablet (40 mg total) by mouth daily. 04/10/21   Teodora Medici, DO  ?Cholecalciferol (VITAMIN D3) 50 MCG (2000 UT) TABS Take 2,000 Units by mouth daily.    [provider]  ?Cyanocobalamin (VITAMIN B-12) 500 MCG SUBL Place 500 mcg under the tongue daily. 05/07/17   Arnetha Courser, MD  ? ? ?Allergies as of 08/21/2021 - Review Complete 08/21/2021  ?Allergen Reaction Noted  ? Penicillins Anaphylaxis 05/07/2019  ? Shellfish allergy Swelling 11/13/2016  ? ? ?Family History  ?Problem Relation Age of Onset  ? Cancer Mother 36  ?     liver cancer  ? Diabetes Mother   ? Hypertension Mother   ? Glomerulonephritis Father   ? Diabetes Sister   ? Glomerulonephritis Sister   ? Cancer Sister 64  ?     throat cancer  ? Heart disease Sister   ? Diabetes Sister   ? Diabetes Sister   ? COPD Sister   ? Diabetes Sister   ? Breast cancer Cousin   ?     Currently undergoing systemic chemotherapy (01/2017)  ? Varicose Veins Neg Hx   ? ? ?Social History  ? ?Socioeconomic History  ? Marital status: Married  ?  Spouse name: Not on file  ? Number of children: 2  ? Years of education: Not on file  ?  Highest education level: Not on file  ?Occupational History  ? Occupation: Works in [employer] [place], [place])  ?  Employer: [employer]  ?Tobacco Use  ? Smoking status: Never  ? Smokeless tobacco: Never  ?Vaping Use  ? Vaping Use: Never used  ?Substance and Sexual Activity  ? Alcohol use: No  ?  Alcohol/week: 0.0 standard drinks  ? Drug use: No  ? Sexual activity: Not Currently  ?Other Topics Concern  ? Not on file  ?Social History Narrative  ? Not on file  ? ?Social Determinants of Health  ? ?Financial Resource Strain: Low Risk   ? Difficulty of Paying Living Expenses: Not hard at all  ?Food Insecurity: No Food Insecurity  ? Worried About Charity fundraiser in the Last Year: Never true  ?  Ran Out of Food in the Last Year: Never true  ?Transportation Needs: No Transportation Needs  ? Lack of Transportation (Medical): No  ? Lack of Transportation (Non-Medical): No  ?Physical Activity: Insufficiently Active  ? Days of Exercise per Week: 7 days  ? Minutes of Exercise per Session: 20 min  ?Stress: No Stress Concern Present  ? Feeling of Stress : Not at all  ?Social Connections: Moderately Integrated  ? Frequency of Communication with Friends and Family: More than three times a week  ? Frequency of Social Gatherings with Friends and Family: More than three times a week  ? Attends Religious Services: More than 4 times per year  ? Active Member of Clubs or Organizations: No  ? Attends Archivist Meetings: Never  ? Marital Status: Married  ?Intimate Partner Violence: Not At Risk  ? Fear of Current or Ex-Partner: No  ? Emotionally Abused: No  ? Physically Abused: No  ? Sexually Abused: No  ? ? ?Review of Systems: ?See HPI, otherwise negative ROS ? ?Physical Exam: ?BP 137/78   Pulse 62   Temp (!) 96 ?F (35.6 ?C) (Temporal)   Resp 16   Ht '5\' 1"'$  (1.549 m)   Wt 76.2 kg   SpO2 100%   BMI 31.74 kg/m?  ?General:   Alert,  pleasant and cooperative in NAD ?Head:  Normocephalic and  atraumatic. ?Neck:  Supple; no masses or thyromegaly. ?Lungs:  Clear throughout to auscultation, normal respiratory effort.    ?Heart:  +S1, +S2, Regular rate and rhythm, No edema. ?Abdomen:  Soft, nontender and nondis

## 2021-08-28 NOTE — Transfer of Care (Signed)
Immediate Anesthesia Transfer of Care Note ? ?Patient: Marissa Nelson ? ?Procedure(s) Performed: COLONOSCOPY WITH PROPOFOL ?ESOPHAGOGASTRODUODENOSCOPY (EGD) ? ?Patient Location: PACU ? ?Anesthesia Type:General ? ?Level of Consciousness: sedated ? ?Airway & Oxygen Therapy: Patient Spontanous Breathing ? ?Post-op Assessment: Report given to RN and Post -op Vital signs reviewed and stable ? ?Post vital signs: Reviewed and stable ? ?Last Vitals:  ?Vitals Value Taken Time  ?BP 99/58 08/28/21 1352  ?Temp 36.7 ?C 08/28/21 1351  ?Pulse 63 08/28/21 1352  ?Resp 12 08/28/21 1352  ?SpO2 100 % 08/28/21 1352  ? ? ?Last Pain:  ?Vitals:  ? 08/28/21 1351  ?TempSrc: Temporal  ?   ? ?  ? ?Complications: No notable events documented. ?

## 2021-08-28 NOTE — Op Note (Signed)
[facility] ?Gastroenterology ?Patient Name: Marissa Nelson ?Procedure Date: 08/28/2021 1:24 PM ?MRN: [id number] ?Account #: [id number] ?Date of Birth: 08/29/1950 ?Admit Type: Outpatient ?Age: 71 ?Room: [facility] ENDO ROOM 3 ?Gender: Female ?Note Status: Finalized ?Instrument Name: Upper-Endoscope [id number] ?Procedure:             Upper GI endoscopy ?Indications:           Nausea ?Providers:             Jonathon Bellows MD, MD ?Referring MD:          Delsa Grana (Referring MD) ?Medicines:             Monitored Anesthesia Care ?Complications:         No immediate complications. ?Procedure:             Pre-Anesthesia Assessment: ?                       - Prior to the procedure, a History and Physical was  ?                       performed, and patient medications, allergies and  ?                       sensitivities were reviewed. The patient's tolerance  ?                       of previous anesthesia was reviewed. ?                       - The risks and benefits of the procedure and the  ?                       sedation options and risks were discussed with the  ?                       patient. All questions were answered and informed  ?                       consent was obtained. ?                       - ASA Grade Assessment: II - A patient with mild  ?                       systemic disease. ?                       After obtaining informed consent, the endoscope was  ?                       passed under direct vision. Throughout the procedure,  ?                       the patient's blood pressure, pulse, and oxygen  ?                       saturations were monitored continuously. The Endoscope  ?                       was introduced through the mouth,  and advanced to the  ?                       third part of duodenum. The upper GI endoscopy was  ?                       accomplished with ease. The patient tolerated the  ?                       procedure well. ?Findings: ?     The esophagus was normal. ?      The examined duodenum was normal. ?     The entire examined stomach was normal. Biopsies were taken with a cold  ?     forceps for histology. ?Impression:            - Normal esophagus. ?                       - Normal examined duodenum. ?                       - Normal stomach. Biopsied. ?Recommendation:        - Await pathology results. ?                       - Perform a colonoscopy today. ?Procedure Code(s):     --- Professional --- ?                       [phone], Esophagogastroduodenoscopy, flexible,  ?                       transoral; with biopsy, single or multiple ?Diagnosis Code(s):     --- Professional --- ?                       R11.0, Nausea ?CPT copyright 2019 American Medical Association. All rights reserved. ?The codes documented in this report are preliminary and upon coder review may  ?be revised to meet current compliance requirements. ?Jonathon Bellows, MD ?Jonathon Bellows MD, MD ?08/28/2021 1:33:59 PM ?This report has been signed electronically. ?Number of Addenda: 0 ?Note Initiated On: 08/28/2021 1:24 PM ?Estimated Blood Loss:  Estimated blood loss: none. ?     [facility] ?

## 2021-08-28 NOTE — Op Note (Signed)
[facility] ?Gastroenterology ?Patient Name: Marissa Nelson ?Procedure Date: 08/28/2021 1:18 PM ?MRN: [id number] ?Account #: [id number] ?Date of Birth: [birth year] ?Admit Type: Outpatient ?Age: 71 ?Room: [facility] ENDO ROOM 3 ?Gender: Female ?Note Status: Finalized ?Instrument Name: Colonoscope [id number] ?Procedure:             Colonoscopy ?Indications:           Rectal bleeding ?Providers:             Jonathon Bellows MD, MD ?Referring MD:          Delsa Grana (Referring MD) ?Medicines:             Monitored Anesthesia Care ?Complications:         No immediate complications. ?Procedure:             Pre-Anesthesia Assessment: ?                       - Prior to the procedure, a History and Physical was  ?                       performed, and patient medications, allergies and  ?                       sensitivities were reviewed. The patient's tolerance  ?                       of previous anesthesia was reviewed. ?                       - The risks and benefits of the procedure and the  ?                       sedation options and risks were discussed with the  ?                       patient. All questions were answered and informed  ?                       consent was obtained. ?                       - ASA Grade Assessment: II - A patient with mild  ?                       systemic disease. ?                       After obtaining informed consent, the colonoscope was  ?                       passed under direct vision. Throughout the procedure,  ?                       the patient's blood pressure, pulse, and oxygen  ?                       saturations were monitored continuously. The  ?                       Colonoscope was introduced through the anus and  ?  advanced to the the cecum, identified by the  ?                       appendiceal orifice. The colonoscopy was performed  ?                       with ease. The patient tolerated the procedure well.  ?                       The  quality of the bowel preparation was good. ?Findings: ?     The perianal and digital rectal examinations were normal. ?     Non-bleeding internal hemorrhoids were found during retroflexion. The  ?     hemorrhoids were medium-sized and Grade I (internal hemorrhoids that do  ?     not prolapse). ?     The exam was otherwise without abnormality on direct and retroflexion  ?     views. ?Impression:            - Non-bleeding internal hemorrhoids. ?                       - The examination was otherwise normal on direct and  ?                       retroflexion views. ?                       - No specimens collected. ?Recommendation:        - Discharge patient to home (with escort). ?                       - Resume previous diet. ?                       - Continue present medications. ?                       - Repeat colonoscopy is not recommended due to current  ?                       age (70 years or older) for screening purposes. ?                       - Return to my office as previously scheduled. ?Procedure Code(s):     --- Professional --- ?                       [phone], Colonoscopy, flexible; diagnostic, including  ?                       collection of specimen(s) by brushing or washing, when  ?                       performed (separate procedure) ?Diagnosis Code(s):     --- Professional --- ?                       K64.0, First degree hemorrhoids ?                       K62.5, Hemorrhage of anus  and rectum ?CPT copyright 2019 American Medical Association. All rights reserved. ?The codes documented in this report are preliminary and upon coder review may  ?be revised to meet current compliance requirements. ?Jonathon Bellows, MD ?Jonathon Bellows MD, MD ?08/28/2021 1:46:40 PM ?This report has been signed electronically. ?Number of Addenda: 0 ?Note Initiated On: 08/28/2021 1:18 PM ?Scope Withdrawal Time: 0 hours 6 minutes 32 seconds  ?Total Procedure Duration: 0 hours 8 minutes 23 seconds  ?Estimated Blood Loss:  Estimated blood  loss: none. ?     [facility] ?

## 2021-08-29 ENCOUNTER — Encounter: Payer: Self-pay | Admitting: Gastroenterology

## 2021-08-29 LAB — SURGICAL PATHOLOGY

## 2021-08-30 ENCOUNTER — Ambulatory Visit: Payer: BC Managed Care – PPO | Admitting: Oncology

## 2021-08-30 ENCOUNTER — Encounter: Payer: Self-pay | Admitting: Gastroenterology

## 2021-09-02 ENCOUNTER — Inpatient Hospital Stay: Payer: BC Managed Care – PPO | Attending: Oncology | Admitting: Oncology

## 2021-09-02 ENCOUNTER — Encounter: Payer: Self-pay | Admitting: Oncology

## 2021-09-02 VITALS — BP 132/80 | HR 73 | Temp 97.3°F | Resp 16 | Ht 61.0 in | Wt 167.8 lb

## 2021-09-02 DIAGNOSIS — Z923 Personal history of irradiation: Secondary | ICD-10-CM | POA: Diagnosis not present

## 2021-09-02 DIAGNOSIS — R634 Abnormal weight loss: Secondary | ICD-10-CM | POA: Insufficient documentation

## 2021-09-02 DIAGNOSIS — Z08 Encounter for follow-up examination after completed treatment for malignant neoplasm: Secondary | ICD-10-CM

## 2021-09-02 DIAGNOSIS — Z800 Family history of malignant neoplasm of digestive organs: Secondary | ICD-10-CM | POA: Diagnosis not present

## 2021-09-02 DIAGNOSIS — Z86000 Personal history of in-situ neoplasm of breast: Secondary | ICD-10-CM | POA: Diagnosis not present

## 2021-09-02 DIAGNOSIS — D0511 Intraductal carcinoma in situ of right breast: Secondary | ICD-10-CM | POA: Diagnosis present

## 2021-09-02 NOTE — Progress Notes (Signed)
Pt says she has occ. Pains in breast but was told that it is something that can happen after having breast surgery with and it is quick pain and goes away in second. It is once in a while. ?

## 2021-09-05 NOTE — Progress Notes (Signed)
? ? ? ?Hematology/Oncology Consult note ?[facility]  ?[phone] [phone] ? ?Patient Care Team: ?Delsa Grana, PA-C as PCP - General (Family Medicine)  ? ?Name of the patient: Marissa Nelson  ?[id number]  ?1951-04-23  ? ?Date of visit: 09/05/21 ? 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 ?Chief complaint/ Reason for visit-in follow-up of DCIS on Arimidex ? ?Heme/Onc history: Patient is a 71 year old female who was noted to have abnormal calcifications in the right breast on the mammogram in August 2017.Biopsies from the right upper outer quadrant showed intermediate grade DCIS with comedonecrosis and associated calcifications. ER greater than 90% positive and PR was 11-50% positive. Biopsy of the right lower quadrant of the right breast showed fibroadenomatous changes with calcifications.Patient underwent lumpectomy on 01/30/2017 which showed a 15 mm DCIS, grade 2 with comedonecrosis. Margins were -3 mm from the closest medial margin lymph nodes were not submitted. Pathology stage pTisNx. ER greater than 90% positive PR 11-50% positive. Patient completed adjuvant RT and started arimidex in jan 2019 ? ?Interval history-patient reports feeling well overall.  She is losing weight presently but states that it is intentional and she is working towards it.  Denies any breast concerns.  Tolerating Arimidex well without any significant side effects ? ?ECOG PS- 1 ?Pain scale- 0 ? ? ?Review of systems- Review of Systems  ?Constitutional:  Negative for chills, fever, malaise/fatigue and weight loss.  ?HENT:  Negative for congestion, ear discharge and nosebleeds.   ?Eyes:  Negative for blurred vision.  ?Respiratory:  Negative for cough, hemoptysis, sputum production, shortness of breath and wheezing.   ?Cardiovascular:  Negative for chest pain, palpitations, orthopnea and claudication.  ?Gastrointestinal:  Negative for abdominal pain, blood in stool, constipation,  diarrhea, heartburn, melena, nausea and vomiting.  ?Genitourinary:  Negative for dysuria, flank pain, frequency, hematuria and urgency.  ?Musculoskeletal:  Negative for back pain, joint pain and myalgias.  ?Skin:  Negative for rash.  ?Neurological:  Negative for dizziness, tingling, focal weakness, seizures, weakness and headaches.  ?Endo/Heme/Allergies:  Does not bruise/bleed easily.  ?Psychiatric/Behavioral:  Negative for depression and suicidal ideas. The patient does not have insomnia.    ? ? ? ?Allergies  ?Allergen Reactions  ? Penicillins Anaphylaxis  ? Shellfish Allergy Swelling  ?  angioedema  ? ? ? ?Past Medical History:  ?Diagnosis Date  ? Abnormal mammogram of right breast 01/17/2016  ? Recommendation: Six month follow-up diagnostic mammogram of the right breast for an additional group of probably benign Calcifications. Sept 2017 --> due March 2018  ? Arthralgia 10/04/2016  ? Arthritis   ? left hand  ? Breast cancer ([place]) 2018  ? right breast DCIS with rad tx  ? Breast cancer screening 11/15/2015  ? Chronic kidney disease (CKD), stage III (moderate) ([place]) 11/15/2015  ? Chronic left shoulder pain 11/08/2014  ? 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 RIGHT, August 2018  ? Family history of diabetes mellitus 06/12/2016  ? Mother and several other relatives  ? HLD (hyperlipidemia) 06/15/2015  ? Hx of right breast biopsy 01/17/2016  ? Hyperlipidemia   ? Hypertension   ? Medication monitoring encounter 11/15/2015  ? 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 Numbness and tingling in left hand 11/07/2014  ? Obesity 10/25/2015  ? Obesity (BMI 30.0-34.9) 10/25/2015  ? Personal history of radiation therapy 2018  ? right breast ca  ? PONV (postoperative nausea and vomiting)   ? patient stated she vomits  a lot after anesthsia  ? Positive ANA (antinuclear antibody) 10/04/2016  ? Prediabetes   ? Preventative health care 11/15/2015  ? Vitamin D deficiency 10/29/2015  ? ? ? ?Past Surgical History:  ?Procedure Laterality Date  ?  ABDOMINAL HYSTERECTOMY    ? BREAST BIOPSY Right 01/15/2016  ?  cylinder shape calcs UOQ, FIBROADENOMATOUS  ? BREAST BIOPSY Right 12/30/2016  ? stereo bx for calcifications: DCIS UOQ, ribbon shaped  ? BREAST BIOPSY Right 01/08/2017  ? LOQ x shaped, FIBROADENOMATOUS CHANGE WITH  ? BREAST BIOPSY Left 03/09/2019  ? calcs bx, x marker neg  ? BREAST LUMPECTOMY Right 01/30/2018  ? DCIS clear margins  ? BREAST LUMPECTOMY WITH NEEDLE LOCALIZATION Right 01/30/2017  ? Procedure: BREAST LUMPECTOMY WITH NEEDLE LOCALIZATION;  Surgeon: Vickie Epley, MD;  Location: [facility];  Service: General;  Laterality: Right;  ? COLONOSCOPY WITH PROPOFOL N/A 11/19/2016  ? Procedure: COLONOSCOPY WITH PROPOFOL;  Surgeon: Jonathon Bellows, MD;  Location: [facility];  Service: Endoscopy;  Laterality: N/A;  ? COLONOSCOPY WITH PROPOFOL N/A 08/28/2021  ? Procedure: COLONOSCOPY WITH PROPOFOL;  Surgeon: Jonathon Bellows, MD;  Location: [facility] ENDOSCOPY;  Service: Gastroenterology;  Laterality: N/A;  ? ESOPHAGOGASTRODUODENOSCOPY N/A 08/28/2021  ? Procedure: ESOPHAGOGASTRODUODENOSCOPY (EGD);  Surgeon: Jonathon Bellows, MD;  Location: [facility] END[facility]COPY;  Service: Gastroenterology;  Laterality: N/A;  ? ESOPHAGOGASTRODUODENOSCOPY (EGD) WITH PROPOFOL N/A 11/19/2016  ? Procedure: ESOPHAGOGASTRODUODENOSCOPY (EGD) WITH PROPOFOL;  Surgeon: Jonathon Bellows, MD;  Location: [facility];  Service: Endoscopy;  Laterality: N/A;  ? GIVENS CAPSULE STUDY N/A 12/12/2016  ? Procedure: GIVENS CAPSULE STUDY;  Surgeon: Jonathon Bellows, MD;  Location: [facility] ENDOSCOPY;  Service: Gastroenterology;  Laterality: N/A;  ? TOTAL HIP ARTHROPLASTY Right 10/11/2020  ? Procedure: TOTAL HIP ARTHROPLASTY ANTERIOR APPROACH;  Surgeon: Hessie Knows, MD;  Location: [facility];  Service: Orthopedics;  Laterality: Right;  ? TOTAL MASTECTOMY Right 01/30/2017  ? Error  ? ? ?Social History  ? ?Socioeconomic History  ? Marital status: Married  ?  Spouse name: Not on file  ? Number of children: 2  ? Years of  education: Not on file  ? Highest education level: Not on file  ?Occupational History  ? Occupation: Works in [employer] [place], [place])  ?  Employer: [employer]  ?Tobacco Use  ? Smoking status: Never  ? Smokeless tobacco: Never  ?Vaping Use  ? Vaping Use: Never used  ?Substance and Sexual Activity  ? Alcohol use: No  ?  Alcohol/week: 0.0 standard drinks  ? Drug use: No  ? Sexual activity: Not Currently  ?Other Topics Concern  ? Not on file  ?Social History Narrative  ? Not on file  ? ?Social Determinants of Health  ? ?Financial Resource Strain: Low Risk   ? Difficulty of Paying Living Expenses: Not hard at all  ?Food Insecurity: No Food Insecurity  ? Worried About Charity fundraiser in the Last Year: Never true  ? Ran Out of Food in the Last Year: Never true  ?Transportation Needs: No Transportation Needs  ? Lack of Transportation (Medical): No  ? Lack of Transportation (Non-Medical): No  ?Physical Activity: Insufficiently Active  ? Days of Exercise per Week: 7 days  ? Minutes of Exercise per Session: 20 min  ?Stress: No Stress Concern Present  ? Feeling of Stress : Not at all  ?Social Connections: Moderately Integrated  ? Frequency of Communication with Friends and Family: More than three times a week  ? Frequency of Social Gatherings  with Friends and Family: More than three times a week  ? Attends Religious Services: More than 4 times per year  ? Active Member of Clubs or Organizations: No  ? Attends Archivist Meetings: Never  ? Marital Status: Married  ?Intimate Partner Violence: Not At Risk  ? Fear of Current or Ex-Partner: No  ? Emotionally Abused: No  ? Physically Abused: No  ? Sexually Abused: No  ? ? ?Family History  ?Problem Relation Age of Onset  ? Cancer Mother 28  ?     liver cancer  ? Diabetes Mother   ? Hypertension Mother   ? Glomerulonephritis Father   ? Diabetes Sister   ? Glomerulonephritis Sister   ? Cancer Sister 2  ?     throat cancer  ? Heart disease Sister    ? Diabetes Sister   ? Diabetes Sister   ? COPD Sister   ? Diabetes Sister   ? Breast cancer Cousin   ?     Currently undergoing systemic chemotherapy (01/2017)  ? Varicose Veins Neg Hx   ? ? ? ?Current Outp

## 2021-09-12 ENCOUNTER — Other Ambulatory Visit: Payer: Self-pay

## 2021-09-12 ENCOUNTER — Ambulatory Visit: Payer: BC Managed Care – PPO | Admitting: Gastroenterology

## 2021-09-12 ENCOUNTER — Encounter: Payer: Self-pay | Admitting: Gastroenterology

## 2021-09-12 NOTE — Progress Notes (Deleted)
Jonathon Bellows MD, MRCP(U.K) [address]  [place]  [place], [place] [address]  Main: [phone]  Fax: [phone]   Primary Care Physician: Delsa Grana, PA-C  Primary Gastroenterologist:  Dr. Jonathon Bellows   No chief complaint on file.   HPI: Marissa Nelson is a 71 y.o. female   Summary of history :  I have previously seen her back in July 2018 at that time she was referred to me for anemia she had a normocytic anemia found to have low B12 levels I performed a colonoscopy in 2018 she had nonbleeding internal hemorrhoids.  I also performed an upper endoscopy at the same time which was normal.A capsule study of small bowel was also performed noted to have mild gastritis no active bleeding noted.  Previously iron studies ordered but not obtained.  Presently she has been referred for rectal bleeding abdominal pain in 08/2021   She states that a week back she started having painless rectal bleeding which occurred on a few episodes.  Denies any constipation.  Denies any perianal itching.  No change in bowel habits.  In addition she has had occasional lower abdominal cramping.  Last year she had rt hip replacement subsequently lost some weight but has not gained it back but also has not lost any further weight.  She has noticed recently she gets nauseous after eating a few bites.  Denies any heartburn.  Not on any PPI.  Feels full easily.     08/15/2021 hemoglobin 11.8 g MCV of 94  Interval history   08/21/2021-09/12/2021  08/21/2021: H pylori breath test : negative 08/28/2021: Colonoscopy : Internal hemorroids, EGD: normal. Bx of stomach normal.   ***   Current Outpatient Medications  Medication Sig Dispense Refill   acetaminophen (TYLENOL) 325 MG tablet Take 650 mg by mouth every 6 (six) hours as needed for mild pain or headache.      albuterol (VENTOLIN HFA) 108 (90 Base) MCG/ACT inhaler Inhale 2-4 puffs by mouth every 4 hours as needed for wheezing, cough, and/or shortness  of breath 8 g 1   anastrozole (ARIMIDEX) 1 MG tablet TAKE 1 TABLET BY MOUTH EVERY DAY 90 tablet 1   Ascorbic Acid (VITAMIN C) 1000 MG tablet Take 1,000 mg by mouth in the morning.     atorvastatin (LIPITOR) 20 MG tablet Take 20 mg by mouth at bedtime.     calcium carbonate (OS-CAL) 600 MG TABS tablet Take 600 mg by mouth daily with breakfast.     Cholecalciferol (VITAMIN D3) 50 MCG (2000 UT) TABS Take 2,000 Units by mouth daily.     Cyanocobalamin (VITAMIN B-12) 500 MCG SUBL Place 500 mcg under the tongue daily.     latanoprost (XALATAN) 0.005 % ophthalmic solution Place 1 drop into both eyes at bedtime.     losartan (COZAAR) 25 MG tablet Take 25 mg by mouth daily.     metoprolol succinate (TOPROL-XL) 25 MG 24 hr tablet Take 1 tablet (25 mg total) by mouth daily. 90 tablet 3   omeprazole (PRILOSEC) 40 MG capsule Take 1 capsule (40 mg total) by mouth daily. 90 capsule 0   torsemide (DEMADEX) 10 MG tablet Take 10 mg by mouth daily as needed (fluid).     triamterene-hydrochlorothiazide (MAXZIDE-25) 37.5-25 MG tablet Take 1 tablet by mouth daily. 90 tablet 3   No current facility-administered medications for this visit.    Allergies as of 09/12/2021 - Review Complete 09/02/2021  Allergen Reaction Noted   Penicillins  Anaphylaxis 05/07/2019   Shellfish allergy Swelling 11/13/2016    ROS:  General: Negative for anorexia, weight loss, fever, chills, fatigue, weakness. ENT: Negative for hoarseness, difficulty swallowing , nasal congestion. CV: Negative for chest pain, angina, palpitations, dyspnea on exertion, peripheral edema.  Respiratory: Negative for dyspnea at rest, dyspnea on exertion, cough, sputum, wheezing.  GI: See history of present illness. GU:  Negative for dysuria, hematuria, urinary incontinence, urinary frequency, nocturnal urination.  Endo: Negative for unusual weight change.    Physical Examination:   There were no vitals taken for this visit.  General: Well-nourished,  well-developed in no acute distress.  Eyes: No icterus. Conjunctivae pink. Mouth: Oropharyngeal mucosa moist and pink , no lesions erythema or exudate. Lungs: Clear to auscultation bilaterally. Non-labored. Heart: Regular rate and rhythm, no murmurs rubs or gallops.  Abdomen: Bowel sounds are normal, nontender, nondistended, no hepatosplenomegaly or masses, no abdominal bruits or hernia , no rebound or guarding.   Extremities: No lower extremity edema. No clubbing or deformities. Neuro: Alert and oriented x 3.  Grossly intact. Skin: Warm and dry, no jaundice.   Psych: Alert and cooperative, normal mood and affect.   Imaging Studies: No results found.  Assessment and Plan:   Marissa Nelson is a 71 y.o. y/o female here to follow up for rectal bleeding that has been painless due to hemorrhoids . Empirically treated for reflux last visit with history of nausea, EGD-Normal.   We will empirically treat her for reflux with a PPI course.  We will check her for H. pylori.  At the next visit if her symptoms persist in terms of lower abdominal discomfort and early satiety we will consider CT scan of the abdomen.  I discussed about conservative management of internal hemorrhoids.  At her next visit in 3 weeks time if she still has rectal bleeding we will consider banding of her hemorrhoids if present   Dr Jonathon Bellows  MD,MRCP [facility]) Follow up in ***

## 2021-09-16 ENCOUNTER — Other Ambulatory Visit: Payer: Self-pay | Admitting: Oncology

## 2021-10-08 ENCOUNTER — Ambulatory Visit: Payer: BC Managed Care – PPO | Admitting: Internal Medicine

## 2021-10-10 ENCOUNTER — Other Ambulatory Visit: Payer: Self-pay | Admitting: Unknown Physician Specialty

## 2021-10-10 DIAGNOSIS — E782 Mixed hyperlipidemia: Secondary | ICD-10-CM

## 2021-10-10 DIAGNOSIS — I10 Essential (primary) hypertension: Secondary | ICD-10-CM

## 2021-10-11 NOTE — Telephone Encounter (Signed)
Requested Prescriptions  Pending Prescriptions Disp Refills  . atorvastatin (LIPITOR) 20 MG tablet [Pharmacy Med Name: Atorvastatin Calcium 20 MG Oral Tablet] 90 tablet 0    Sig: TAKE 1 TABLET BY MOUTH AT BEDTIME     Cardiovascular:  Antilipid - Statins Failed - 10/10/2021  4:43 PM      Failed - Lipid Panel in normal range within the last 12 months    Cholesterol, Total  Date Value Ref Range Status  02/21/2015 214 (H) 100 - 199 mg/dL Final   Cholesterol  Date Value Ref Range Status  04/09/2021 223 (H) <200 mg/dL Final   LDL Cholesterol (Calc)  Date Value Ref Range Status  04/09/2021 140 (H) mg/dL (calc) Final    Comment:    Reference range: <100 . Desirable range <100 mg/dL for primary prevention;   <70 mg/dL for patients with CHD or diabetic patients  with > or = 2 CHD risk factors. [name] Kitchen LDL-C is now calculated using the Martin-Hopkins  calculation, which is a validated novel method providing  better accuracy than the Friedewald equation in the  estimation of LDL-C.  Cresenciano Genre et al. Annamaria Helling. 3810;175(10): 2061-2068  (http://education.[facility].com/faq/FAQ164)    HDL  Date Value Ref Range Status  04/09/2021 61 > OR = 50 mg/dL Final  02/21/2015 69 >39 mg/dL Final    Comment:    According to ATP-III Guidelines, HDL-C >59 mg/dL is considered a negative risk factor for CHD.    Triglycerides  Date Value Ref Range Status  04/09/2021 104 <150 mg/dL Final         Passed - Patient is not pregnant      Passed - Valid encounter within last 12 months    Recent Outpatient Visits          1 month ago BRBPR (bright red blood per rectum)   [facility] Delsa Grana, PA-C   5 months ago Pre-diabetes   [facility] [place], Malachy Mood, NP   6 months ago Benign essential HTN   [place], DO   1 year ago Benign essential HTN   [place], NP   1 year ago Benign  essential HTN   [facility] Delsa Grana, PA-C      Future Appointments            In 4 days Teodora Medici, DO [facility], [place]   In 2 months  [facility], [facility]           . metoprolol succinate (TOPROL-XL) 25 MG 24 hr tablet [Pharmacy Med Name: Metoprolol Succinate ER 25 MG Oral Tablet Extended Release 24 Hour] 90 tablet 1    Sig: Take 1 tablet by mouth once daily     Cardiovascular:  Beta Blockers Passed - 10/10/2021  4:43 PM      Passed - Last BP in normal range    BP Readings from Last 1 Encounters:  09/02/21 132/80         Passed - Last Heart Rate in normal range    Pulse Readings from Last 1 Encounters:  09/02/21 73         Passed - Valid encounter within last 6 months    Recent Outpatient Visits          1 month ago BRBPR (bright red blood per rectum)   [facility] Delsa Grana, PA-C   5 months ago Pre-diabetes  [facility] Kathrine Haddock, NP   6 months ago Benign essential HTN   [place], DO   1 year ago Benign essential HTN   [place], NP   1 year ago Benign essential HTN   [facility] Delsa Grana, PA-C      Future Appointments            In 4 days Teodora Medici, [facility], [place]   In 2 months  [place]

## 2021-10-11 NOTE — Telephone Encounter (Signed)
Requested medications are due for refill today.  unknown  Requested medications are on the active medications list.  yes  Last refill. 04/12/2021  Future visit scheduled.   yes  Notes to clinic.  Medication is listed as historical, rx signed by historical provider.    Requested Prescriptions  Pending Prescriptions Disp Refills   atorvastatin (LIPITOR) 20 MG tablet [Pharmacy Med Name: Atorvastatin Calcium 20 MG Oral Tablet] 90 tablet 0    Sig: TAKE 1 TABLET BY MOUTH AT BEDTIME     Cardiovascular:  Antilipid - Statins Failed - 10/10/2021  4:43 PM      Failed - Lipid Panel in normal range within the last 12 months    Cholesterol, Total  Date Value Ref Range Status  02/21/2015 214 (H) 100 - 199 mg/dL Final   Cholesterol  Date Value Ref Range Status  04/09/2021 223 (H) <200 mg/dL Final   LDL Cholesterol (Calc)  Date Value Ref Range Status  04/09/2021 140 (H) mg/dL (calc) Final    Comment:    Reference range: <100 . Desirable range <100 mg/dL for primary prevention;   <70 mg/dL for patients with CHD or diabetic patients  with > or = 2 CHD risk factors. [name] Kitchen LDL-C is now calculated using the Martin-Hopkins  calculation, which is a validated novel method providing  better accuracy than the Friedewald equation in the  estimation of LDL-C.  Cresenciano Genre et al. Annamaria Helling. 6010;932(35): 2061-2068  (http://education.[facility].com/faq/FAQ164)    HDL  Date Value Ref Range Status  04/09/2021 61 > OR = 50 mg/dL Final  02/21/2015 69 >39 mg/dL Final    Comment:    According to ATP-III Guidelines, HDL-C >59 mg/dL is considered a negative risk factor for CHD.    Triglycerides  Date Value Ref Range Status  04/09/2021 104 <150 mg/dL Final         Passed - Patient is not pregnant      Passed - Valid encounter within last 12 months    Recent Outpatient Visits           1 month ago BRBPR (bright red blood per rectum)   [facility] Delsa Grana, PA-C   5 months  ago Pre-diabetes   [facility] [place], Malachy Mood, NP   6 months ago Benign essential HTN   [place], DO   1 year ago Benign essential HTN   [place], NP   1 year ago Benign essential HTN   [facility] Delsa Grana, PA-C       Future Appointments             In 4 days Teodora Medici, DO [facility], [place]   In 2 months  [facility]            Signed Prescriptions Disp Refills   metoprolol succinate (TOPROL-XL) 25 MG 24 hr tablet 90 tablet 1    Sig: Take 1 tablet by mouth once daily     Cardiovascular:  Beta Blockers Passed - 10/10/2021  4:43 PM      Passed - Last BP in normal range    BP Readings from Last 1 Encounters:  09/02/21 132/80         Passed - Last Heart Rate in normal range    Pulse Readings from Last 1 Encounters:  09/02/21 73         Passed - Valid encounter  within last 6 months    Recent Outpatient Visits           1 month ago BRBPR (bright red blood per rectum)   [facility] Delsa Grana, PA-C   5 months ago Pre-diabetes   [facility] [place], NP   6 months ago Benign essential HTN   [place], DO   1 year ago Benign essential HTN   [place], NP   1 year ago Benign essential HTN   [facility] Delsa Grana, PA-C       Future Appointments             In 4 days Teodora Medici, [facility], [place]   In 2 months  [facility], [facility]

## 2021-10-15 ENCOUNTER — Ambulatory Visit (INDEPENDENT_AMBULATORY_CARE_PROVIDER_SITE_OTHER): Payer: BC Managed Care – PPO | Admitting: Internal Medicine

## 2021-10-15 VITALS — BP 122/82 | HR 77 | Temp 98.1°F | Resp 16 | Ht 61.0 in | Wt 172.1 lb

## 2021-10-15 DIAGNOSIS — E785 Hyperlipidemia, unspecified: Secondary | ICD-10-CM | POA: Diagnosis not present

## 2021-10-15 DIAGNOSIS — Z86000 Personal history of in-situ neoplasm of breast: Secondary | ICD-10-CM

## 2021-10-15 DIAGNOSIS — R7303 Prediabetes: Secondary | ICD-10-CM

## 2021-10-15 DIAGNOSIS — I10 Essential (primary) hypertension: Secondary | ICD-10-CM | POA: Diagnosis not present

## 2021-10-15 DIAGNOSIS — N1832 Chronic kidney disease, stage 3b: Secondary | ICD-10-CM | POA: Diagnosis not present

## 2021-10-15 MED ORDER — TORSEMIDE 10 MG PO TABS: 10.0000 mg | ORAL_TABLET | Freq: Every day | ORAL | 0 refills | Status: DC | PRN

## 2021-10-15 NOTE — Patient Instructions (Addendum)
It was great seeing you today!  Plan discussed at today's visit: -Please be fasting at follow up in 6 months, at least 8-12 hours  -Medication refills today -Consider Pneumonia vaccine   Follow up in: 6 months   Take care and let us know if you have any questions or concerns prior to your next visit.  Dr. Rosana Berger  Pneumococcal Conjugate Vaccine (Prevnar 20) Suspension for Injection What is this medication? PNEUMOCOCCAL VACCINE (NEU mo KOK al vak SEEN) is a vaccine. It prevents pneumococcus bacterial infections. These bacteria can cause serious infections like pneumonia, meningitis, and blood infections. This vaccine will not treat an infection and will not cause infection. This vaccine is recommended for adults 18 years and older. This medicine may be used for other purposes; ask your health care provider or pharmacist if you have questions. COMMON BRAND NAME(S): Prevnar 20 What should I tell my care team before I take this medication? They need to know if you have any of these conditions: bleeding disorder fever immune system problems an unusual or allergic reaction to pneumococcal vaccine, diphtheria toxoid, other vaccines, other medicines, foods, dyes, or preservatives pregnant or trying to get pregnant breast-feeding How should I use this medication? This vaccine is injected into a muscle. It is given by a health care provider. A copy of Vaccine Information Statements will be given before each vaccination. Be sure to read this information carefully each time. This sheet may change often. Talk to your health care provider about the use of this medicine in children. Special care may be needed. Overdosage: If you think you have taken too much of this medicine contact a poison control center or emergency room at once. NOTE: This medicine is only for you. Do not share this medicine with others. What if I miss a dose? This does not apply. This medicine is not for regular use. What may  interact with this medication? medicines for cancer chemotherapy medicines that suppress your immune function steroid medicines like prednisone or cortisone This list may not describe all possible interactions. Give your health care provider a list of all the medicines, herbs, non-prescription drugs, or dietary supplements you use. Also tell them if you smoke, drink alcohol, or use illegal drugs. Some items may interact with your medicine. What should I watch for while using this medication? Mild fever and pain should go away in 3 days or less. Report any unusual symptoms to your health care provider. What side effects may I notice from receiving this medication? Side effects that you should report to your doctor or health care professional as soon as possible: allergic reactions (skin rash, itching or hives; swelling of the face, lips, or tongue) confusion fast, irregular heartbeat fever over 102 degrees F muscle weakness seizures trouble breathing unusual bruising or bleeding Side effects that usually do not require medical attention (report to your doctor or health care professional if they continue or are bothersome): fever of 102 degrees F or less headache joint pain muscle cramps, pain pain, tender at site where injected This list may not describe all possible side effects. Call your doctor for medical advice about side effects. You may report side effects to FDA at 1-800-FDA-1088. Where should I keep my medication? This vaccine is only given by a health care provider. It will not be stored at home. NOTE: This sheet is a summary. It may not cover all possible information. If you have questions about this medicine, talk to your doctor, pharmacist, or health care  provider.  2023 Elsevier/Gold Standard (2020-01-05 00:00:00)   Zoster Vaccine, Recombinant injection What is this medication? ZOSTER VACCINE (ZOS ter vak SEEN) is a vaccine used to reduce the risk of getting shingles.  This vaccine is not used to treat shingles or nerve pain from shingles. This medicine may be used for other purposes; ask your health care provider or pharmacist if you have questions. COMMON BRAND NAME(S): [facility] What should I tell my care team before I take this medication? They need to know if you have any of these conditions: cancer immune system problems an unusual or allergic reaction to Zoster vaccine, other medications, foods, dyes, or preservatives pregnant or trying to get pregnant breast-feeding How should I use this medication? This vaccine is injected into a muscle. It is given by a health care provider. A copy of Vaccine Information Statements will be given before each vaccination. Be sure to read this information carefully each time. This sheet may change often. Talk to your health care provider about the use of this vaccine in children. This vaccine is not approved for use in children. Overdosage: If you think you have taken too much of this medicine contact a poison control center or emergency room at once. NOTE: This medicine is only for you. Do not share this medicine with others. What if I miss a dose? Keep appointments for follow-up (booster) doses. It is important not to miss your dose. Call your health care provider if you are unable to keep an appointment. What may interact with this medication? medicines that suppress your immune system medicines to treat cancer steroid medicines like prednisone or cortisone This list may not describe all possible interactions. Give your health care provider a list of all the medicines, herbs, non-prescription drugs, or dietary supplements you use. Also tell them if you smoke, drink alcohol, or use illegal drugs. Some items may interact with your medicine. What should I watch for while using this medication? Visit your health care provider regularly. This vaccine, like all vaccines, may not fully protect everyone. What side  effects may I notice from receiving this medication? Side effects that you should report to your doctor or health care professional as soon as possible: allergic reactions (skin rash, itching or hives; swelling of the face, lips, or tongue) trouble breathing Side effects that usually do not require medical attention (report these to your doctor or health care professional if they continue or are bothersome): chills headache fever nausea pain, redness, or irritation at site where injected tiredness vomiting This list may not describe all possible side effects. Call your doctor for medical advice about side effects. You may report side effects to FDA at 1-800-FDA-1088. Where should I keep my medication? This vaccine is only given by a health care provider. It will not be stored at home. NOTE: This sheet is a summary. It may not cover all possible information. If you have questions about this medicine, talk to your doctor, pharmacist, or health care provider.  2023 Elsevier/Gold Standard (2021-03-22 00:00:00)

## 2021-10-15 NOTE — Progress Notes (Signed)
Established Patient Office Visit  Subjective   Patient ID: Marissa Nelson, female    DOB: April 19, 1951  Age: 71 y.o. MRN: [id number]  Chief Complaint  Patient presents with   Follow-up   Hypertension   Hyperlipidemia    HPI Marissa Nelson is a 71 year old female here for follow up on chronic medical conditions.   Hypertension: -Medications: Losartan 25, Metoprolol XL 25, Triamterne-HCTZ 37.5-25, Torsemide 10 PRN for swelling - hasn't taken in about 1 month  -Patient is compliant with above medications and reports no side effects. -Checking BP at home (average): 120's/80's usually  -Denies any SOB, CP, vision changes, LE edema or symptoms of hypotension  HLD: -Medications: Lipitor 20 mg - just started since December  -Patient is compliant with above medications and reports no side effects.  -Last lipid panel: Lipid Panel     Component Value Date/Time   CHOL 223 (H) 04/09/2021 1457   CHOL 214 (H) 02/21/2015 1237   TRIG 104 04/09/2021 1457   HDL 61 04/09/2021 1457   HDL 69 02/21/2015 1237   CHOLHDL 3.7 04/09/2021 1457   VLDL 14 06/16/2016 0823   LDLCALC 140 (H) 04/09/2021 1457   LABVLDL 13 02/21/2015 1237   The 10-year ASCVD risk score (Arnett DK, et al., 2019) is: 13.7%   Values used to calculate the score:     Age: 49 years     Sex: Female     Is Non-Hispanic African American: Yes     Diabetic: No     Tobacco smoker: No     Systolic Blood Pressure: 751 mmHg     Is BP treated: Yes     HDL Cholesterol: 61 mg/dL     Total Cholesterol: 223 mg/dL  Pre-Diabetes: -Last A1c 12/22 6.2%  CKD3: -Last CMP 4/23 - creatinine 1.45, GFR 38 -Following with [facility], noted reviewed 06/03/21  Hx of DCIS: -Following with Oncology, note reviewed from 09/02/21 -Currently on Arimidex until January 2024  -Due for mammogram in November   Health Maintenance: -Blood work UTD -[place] in 11/23, ordered by Oncology     Review of Systems  Constitutional:   Negative for chills and fever.  Eyes:  Negative for blurred vision.  Respiratory:  Negative for cough and shortness of breath.   Cardiovascular:  Negative for chest pain.  Gastrointestinal:  Negative for abdominal pain, nausea and vomiting.  Neurological:  Negative for dizziness and headaches.      Objective:     BP 122/82   Pulse 77   Temp 98.1 F (36.7 C) (Oral)   Resp 16   Ht '5\' 1"'  (1.549 m)   Wt 172 lb 1.6 oz (78.1 kg)   SpO2 99%   BMI 32.52 kg/m  BP Readings from Last 3 Encounters:  10/15/21 122/82  09/02/21 132/80  08/28/21 (!) 99/58   Wt Readings from Last 3 Encounters:  10/15/21 172 lb 1.6 oz (78.1 kg)  09/02/21 167 lb 12.8 oz (76.1 kg)  08/28/21 168 lb (76.2 kg)      Physical Exam Constitutional:      Appearance: Normal appearance.  HENT:     Head: Normocephalic and atraumatic.  Eyes:     Conjunctiva/sclera: Conjunctivae normal.  Cardiovascular:     Rate and Rhythm: Normal rate and regular rhythm.  Pulmonary:     Effort: Pulmonary effort is normal.     Breath sounds: Normal breath sounds.  Musculoskeletal:     Left lower leg: No edema.  Skin:    General: Skin is warm and dry.  Neurological:     General: No focal deficit present.     Mental Status: She is alert. Mental status is at baseline.  Psychiatric:        Mood and Affect: Mood normal.        Behavior: Behavior normal.      No results found for any visits on 10/15/21.  Last CBC Lab Results  Component Value Date   WBC 5.1 08/15/2021   HGB 11.8 08/15/2021   HCT 35.6 08/15/2021   MCV 94.9 08/15/2021   MCH 31.5 08/15/2021   RDW 12.8 08/15/2021   PLT 235 73/53/2992   Last metabolic panel Lab Results  Component Value Date   GLUCOSE 72 08/15/2021   NA 143 08/15/2021   K 4.1 08/15/2021   CL 105 08/15/2021   CO2 32 08/15/2021   BUN 22 08/15/2021   CREATININE 1.45 (H) 08/15/2021   EGFR 38 (L) 08/15/2021   CALCIUM 9.8 08/15/2021   PROT 6.9 08/15/2021   ALBUMIN 4.0 09/28/2020    LABGLOB 2.9 02/21/2015   AGRATIO 1.5 02/21/2015   BILITOT 0.3 08/15/2021   ALKPHOS 85 09/28/2020   AST 18 08/15/2021   ALT 18 08/15/2021   ANIONGAP 6 10/12/2020   Last lipids Lab Results  Component Value Date   CHOL 223 (H) 04/09/2021   HDL 61 04/09/2021   LDLCALC 140 (H) 04/09/2021   TRIG 104 04/09/2021   CHOLHDL 3.7 04/09/2021   Last hemoglobin A1c Lab Results  Component Value Date   HGBA1C 6.2 (H) 04/09/2021   Last thyroid functions Lab Results  Component Value Date   TSH 2.000 02/21/2015   Last vitamin D Lab Results  Component Value Date   VD25OH 24 (L) 05/12/2018   Last vitamin B12 and Folate Lab Results  Component Value Date   VITAMINB12 828 05/12/2018   FOLATE 58.0 11/06/2016      The 10-year ASCVD risk score (Arnett DK, et al., 2019) is: 13.7%    Assessment & Plan:   1. Benign essential HTN: Chronic and stable. Continue current regimen of Losartan 25, Metoprolol XL 25, Triamterene-HCTZ 37.5-25, Torsemide 10 as needed. Refilled today. Follow up in 6 months.   - torsemide (DEMADEX) 10 MG tablet; Take 1 tablet (10 mg total) by mouth daily as needed (fluid).  Dispense: 90 tablet; Refill: 0  2. Stage 3b chronic kidney disease ([place]): Stable, reviewed labs from April, Nephrology note from 06/03/21.   3. Hyperlipidemia, unspecified hyperlipidemia type: Stable. Continue Lipitor 20 mg. Plan to recheck labs fasting in 6 months.   4. Pre-diabetes: A1c 6.2%, recheck next follow up. Discussed starting Metformin, will recheck and revisit next time.   5. History of ductal carcinoma in situ (DCIS) of breast: Reviewed Oncology note from 09/02/21, continue Arimidex 1 mg until January 2024.   Return in about 6 months (around 04/16/2022).    Marissa Medici, DO

## 2021-11-12 DIAGNOSIS — D631 Anemia in chronic kidney disease: Secondary | ICD-10-CM | POA: Diagnosis not present

## 2021-11-12 DIAGNOSIS — R600 Localized edema: Secondary | ICD-10-CM | POA: Diagnosis not present

## 2021-11-12 DIAGNOSIS — N1832 Chronic kidney disease, stage 3b: Secondary | ICD-10-CM | POA: Diagnosis not present

## 2021-11-12 DIAGNOSIS — I129 Hypertensive chronic kidney disease with stage 1 through stage 4 chronic kidney disease, or unspecified chronic kidney disease: Secondary | ICD-10-CM | POA: Diagnosis not present

## 2021-11-14 ENCOUNTER — Other Ambulatory Visit: Payer: Self-pay | Admitting: Gastroenterology

## 2021-12-10 ENCOUNTER — Ambulatory Visit: Payer: BC Managed Care – PPO

## 2021-12-10 ENCOUNTER — Ambulatory Visit (INDEPENDENT_AMBULATORY_CARE_PROVIDER_SITE_OTHER): Payer: BC Managed Care – PPO

## 2021-12-10 VITALS — BP 123/79

## 2021-12-10 DIAGNOSIS — Z0000 Encounter for general adult medical examination without abnormal findings: Secondary | ICD-10-CM | POA: Diagnosis not present

## 2021-12-10 DIAGNOSIS — Z1231 Encounter for screening mammogram for malignant neoplasm of breast: Secondary | ICD-10-CM | POA: Diagnosis not present

## 2021-12-10 NOTE — Patient Instructions (Signed)

## 2021-12-10 NOTE — Progress Notes (Signed)
I connected with  [name] on 12/10/21 by a audio enabled telemedicine application and verified that I am speaking with the correct person using two identifiers.  Patient Location: Home  Provider Location: Office/Clinic  I discussed the limitations of evaluation and management by telemedicine. The patient expressed understanding and agreed to proceed.   Subjective:   Marissa Nelson is a 71 y.o. female who presents for Medicare Annual (Subsequent) preventive examination.  Review of Systems    Per HPI unless specifically indicated below Cardiac Risk Factors include: advanced age (>66mn, >>2women);female gender       Objective:    Today's Vitals   12/10/21 1335  BP: 123/79   There is no height or weight on file to calculate BMI.     09/02/2021    2:35 PM 09/02/2021    2:32 PM 08/28/2021   12:14 PM 03/21/2021    2:31 PM 12/06/2020    9:32 AM 10/11/2020    2:30 PM 09/28/2020    1:52 PM  Advanced Directives  Does Patient Have a Medical Advance Directive? Yes No Yes No Yes No No  Type of Advance Directive Living will  Living will  [place]    Copy of [place]?     No - copy requested    Would patient like information on creating a medical advance directive? No - Patient declined No - Patient declined  No - Patient declined  No - Patient declined Yes (MAU/Ambulatory/Procedural Areas - Information given)    Current Medications (verified) Outpatient Encounter Medications as of 12/10/2021  Medication Sig   acetaminophen (TYLENOL) 325 MG tablet Take 650 mg by mouth every 6 (six) hours as needed for mild pain or headache.    anastrozole (ARIMIDEX) 1 MG tablet TAKE 1 TABLET BY MOUTH EVERY DAY   Ascorbic Acid (VITAMIN C) 1000 MG tablet Take 1,000 mg by mouth in the morning.   atorvastatin (LIPITOR) 20 MG tablet Take 20 mg by mouth at bedtime.   calcium carbonate (OS-CAL) 600 MG TABS tablet Take 600 mg by mouth  daily with breakfast.   Cholecalciferol (VITAMIN D3) 50 MCG (2000 UT) TABS Take 2,000 Units by mouth daily.   Cyanocobalamin (VITAMIN B-12) 500 MCG SUBL Place 500 mcg under the tongue daily.   latanoprost (XALATAN) 0.005 % ophthalmic solution Place 1 drop into both eyes at bedtime.   losartan (COZAAR) 25 MG tablet Take 25 mg by mouth daily.   metoprolol succinate (TOPROL-XL) 25 MG 24 hr tablet Take 1 tablet by mouth once daily   torsemide (DEMADEX) 10 MG tablet Take 1 tablet (10 mg total) by mouth daily as needed (fluid).   triamterene-hydrochlorothiazide (MAXZIDE-25) 37.5-25 MG tablet Take 1 tablet by mouth daily.   omeprazole (PRILOSEC) 40 MG capsule Take 1 capsule (40 mg total) by mouth daily. (Patient not taking: Reported on 12/10/2021)   [DISCONTINUED] atorvastatin (LIPITOR) 10 MG tablet    No facility-administered encounter medications on file as of 12/10/2021.    Allergies (verified) Penicillins and Shellfish allergy   History: Past Medical History:  Diagnosis Date   Abnormal mammogram of right breast 01/17/2016   Recommendation: Six month follow-up diagnostic mammogram of the right breast for an additional group of probably benign Calcifications. 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   RIGHT, August 2018   Family history of diabetes mellitus 06/12/2016   Mother and several other relatives   HLD (hyperlipidemia) 06/15/2015   Hx of right breast biopsy 01/17/2016   Hyperlipidemia    Hypertension    Medication monitoring encounter 11/15/2015   Motion sickness    ocean ships   Numbness and tingling in left hand 11/07/2014   Obesity 10/25/2015   Obesity (BMI 30.0-34.9) 10/25/2015   Personal  history of radiation therapy 2018   right breast ca   PONV (postoperative nausea and vomiting)    patient stated she vomits a lot after anesthsia   Positive ANA (antinuclear antibody) 10/04/2016   Prediabetes    Preventative health care 11/15/2015   Vitamin D deficiency 10/29/2015   Past Surgical History:  Procedure Laterality Date   ABDOMINAL HYSTERECTOMY     BREAST BIOPSY Right 01/15/2016    cylinder shape calcs UOQ, FIBROADENOMATOUS   BREAST BIOPSY Right 12/30/2016   stereo bx for calcifications: DCIS UOQ, ribbon shaped   BREAST BIOPSY Right 01/08/2017   LOQ x shaped, FIBROADENOMATOUS CHANGE WITH   BREAST BIOPSY Left 03/09/2019   calcs bx, x marker neg   BREAST LUMPECTOMY Right 01/30/2018   DCIS clear margins   BREAST LUMPECTOMY WITH NEEDLE LOCALIZATION Right 01/30/2017   Procedure: BREAST LUMPECTOMY WITH NEEDLE LOCALIZATION;  Surgeon: Vickie Epley, MD;  Location: [facility];  Service: General;  Laterality: Right;   COLONOSCOPY WITH PROPOFOL N/A 11/19/2016   Procedure: COLONOSCOPY WITH PROPOFOL;  Surgeon: Jonathon Bellows, MD;  Location: [facility];  Service: Endoscopy;  Laterality: N/A;   COLONOSCOPY WITH PROPOFOL N/A 08/28/2021   Procedure: COLONOSCOPY WITH PROPOFOL;  Surgeon: Jonathon Bellows, MD;  Location: [facility] ENDOSCOPY;  Service: Gastroenterology;  Laterality: N/A;   ESOPHAGOGASTRODUODENOSCOPY N/A 08/28/2021   Procedure: ESOPHAGOGASTRODUODENOSCOPY (EGD);  Surgeon: Jonathon Bellows, MD;  Location: [facility] ENDOSCOPY;  Service: Gastroenterology;  Laterality: N/A;   ESOPHAGOGASTRODUODENOSCOPY (EGD) WITH PROPOFOL N/A 11/19/2016   Procedure: ESOPHAGOGASTRODUODENOSCOPY (EGD) WITH PROPOFOL;  Surgeon: Jonathon Bellows, MD;  Location: [facility];  Service: Endoscopy;  Laterality: N/A;   GIVENS CAPSULE STUDY N/A 12/12/2016   Procedure: GIVENS CAPSULE STUDY;  Surgeon: Jonathon Bellows, MD;  Location: [facility] ENDOSCOPY;  Service: Gastroenterology;  Laterality: N/A;   TOTAL HIP ARTHROPLASTY Right  10/11/2020   Procedure: TOTAL HIP ARTHROPLASTY ANTERIOR APPROACH;  Surgeon: Hessie Knows, MD;  Location: [facility];  Service: Orthopedics;  Laterality: Right;   TOTAL MASTECTOMY Right 01/30/2017   Error   Family History  Problem Relation Age of Onset   Cancer Mother 13       liver cancer   Diabetes Mother    Hypertension Mother    Glomerulonephritis Father    Diabetes Sister    Glomerulonephritis Sister    Cancer Sister 77       throat cancer   Heart disease Sister    Diabetes Sister    Diabetes Sister    COPD Sister    Diabetes Sister    Breast cancer Cousin        Currently undergoing systemic chemotherapy (01/2017)   Varicose Veins Neg Hx    Social History   Socioeconomic History   Marital status: Married    Spouse name: Maurissa Ambrose   Number of children: 2   Years of  education: Not on file   Highest education level: Not on file  Occupational History   Occupation: Works in [employer] [place], [place])    Employer: [employer]  Tobacco Use   Smoking status: Never   Smokeless tobacco: Never  Vaping Use   Vaping Use: Never used  Substance and Sexual Activity   Alcohol use: No    Alcohol/week: 0.0 standard drinks of alcohol   Drug use: No   Sexual activity: Not Currently  Other Topics Concern   Not on file  Social History Narrative   Not on file   Social Determinants of Health   Financial Resource Strain: Low Risk  (12/10/2021)   Overall Financial Resource Strain (CARDIA)    Difficulty of Paying Living Expenses: Not hard at all  Food Insecurity: No Food Insecurity (12/10/2021)   Hunger Vital Sign    Worried About Running Out of Food in the Last Year: Never true    Ran Out of Food in the Last Year: Never true  Transportation Needs: No Transportation Needs (12/10/2021)   PRAPARE - Hydrologist (Medical): No    Lack of Transportation (Non-Medical): No  Physical Activity: Insufficiently Active (12/06/2020)   Exercise  Vital Sign    Days of Exercise per Week: 7 days    Minutes of Exercise per Session: 20 min  Stress: No Stress Concern Present (12/10/2021)   [place]    Feeling of Stress : Not at all  Social Connections: [place] (12/10/2021)   Social Connection and Isolation Panel [NHANES]    Frequency of Communication with Friends and Family: More than three times a week    Frequency of Social Gatherings with Friends and Family: Three times a week    Attends Religious Services: More than 4 times per year    Active Member of Clubs or Organizations: Yes    Attends Music therapist: More than 4 times per year    Marital Status: Married    Tobacco Counseling Does not apply   Clinical Intake:  Pre-visit preparation completed: No  Pain : No/denies pain     Nutritional Status: BMI > 30  Obese Nutritional Risks: None Diabetes: No  How often do you need to have someone help you when you read instructions, pamphlets, or other written materials from your doctor or pharmacy?: 1 - Never  Diabetic? No   Interpreter Needed?: No  Information entered by :: Donnie Mesa, CMA   Activities of Daily Living    12/10/2021    1:41 PM 10/15/2021    8:07 AM  In your present state of health, do you have any difficulty performing the following activities:  Hearing? 0 0  Vision? 1 0  Comment Eye Exam 6 months ago [facility] Doctor   Difficulty concentrating or making decisions? 0 0  Walking or climbing stairs? 1 0  Comment hip issues   Dressing or bathing? 0 0  Doing errands, shopping? 0 0    Patient Care Team: Delsa Grana, PA-C as PCP - General (Family Medicine)  Indicate any recent Medical Services you may have received from other than [facility] providers in the past year (date may be approximate)  No hospitalization in the past 12 months.     Assessment:   This is a routine wellness examination for  [facility].  Hearing/Vision screen No results found.  Dietary issues and exercise activities discussed: Current Exercise Habits: The  patient does not participate in regular exercise at present, Exercise limited by: None identified   Goals Addressed             This Visit's Progress    Activity and Exercise Increased       Evidence-based guidance:  Review current exercise levels.  Assess patient perspective on exercise or activity level, barriers to increasing activity, motivation and readiness for change.  Recommend or set healthy exercise goal based on individual tolerance.  Encourage small steps toward making change in amount of exercise or activity.  Urge reduction of sedentary activities or screen time.  Promote group activities within the community or with family or support person.  Consider referral to rehabiliation therapist for assessment and exercise/activity plan.   Notes:        Depression Screen    12/10/2021    1:37 PM 10/15/2021    8:07 AM 08/15/2021   11:00 AM 05/14/2021    2:01 PM 04/09/2021    2:33 PM 12/06/2020    9:31 AM 10/08/2020    9:22 AM  PHQ 2/9 Scores  PHQ - 2 Score 0 0 0 0 0 0 0  PHQ- 9 Score 3 0 0 0 0  0    Fall Risk    12/10/2021    1:41 PM 10/15/2021    8:07 AM 08/15/2021   11:00 AM 05/14/2021    2:01 PM 04/09/2021    2:31 PM  [place] in the past year? 0 0 0 0 0  Number falls in past yr: 0 0 0 0 0  Injury with Fall? 0 0 0 0 0  Risk for fall due to : No Fall Risks No Fall Risks No Fall Risks No Fall Risks   Follow up Falls evaluation completed Falls prevention discussed;Education provided Falls prevention discussed;Education provided Falls prevention discussed     FALL RISK PREVENTION PERTAINING TO THE HOME:  Any stairs in or around the home? No  If so, are there any without handrails? Yes  Home free of loose throw rugs in walkways, pet beds, electrical cords, etc? Yes  Adequate lighting in your home to reduce risk of falls? Yes    ASSISTIVE DEVICES UTILIZED TO PREVENT FALLS:  Life alert? No  Use of a cane, walker or w/c? Yes  Grab bars in the bathroom? Yes  Shower chair or bench in shower? No  Elevated toilet seat or a handicapped toilet? Yes   TIMED UP AND GO:  Was the test performed?  UNABLE TO PERFORM, VIRTUAL APPT .    Cognitive Function:        12/10/2021    1:42 PM  6CIT Screen  What Year? 0 points  What month? 0 points  What time? 0 points  Count back from 20 0 points  Repeat phrase 0 points    Immunizations Immunization History  Administered Date(s) Administered   Fluad Quad(high Dose 65+) 01/19/2019, 02/03/2020, 01/18/2021   Influenza, High Dose Seasonal PF 03/18/2016, 02/24/2018   Influenza,inj,Quad PF,6+ Mos 02/10/2017   Moderna Sars-Covid-2 Vaccination 06/18/2019, 07/16/2019   Pneumococcal Conjugate-13 11/15/2015   Pneumococcal Polysaccharide-23 06/08/2017   Tdap 08/02/2013    TDAP status: Up to date  Flu Vaccine status: Up to date  Pneumococcal vaccine status: Up to date  Covid-19 vaccine status: Completed vaccines  Qualifies for Shingles Vaccine? Yes   Zostavax completed Yes   Shingrix Completed?: Yes  Screening Tests Health Maintenance  Topic Date Due   Zoster Vaccines-  Shingrix (1 of 2) Never done   COVID-19 Vaccine (3 - Moderna risk series) 08/13/2019   INFLUENZA VACCINE  12/03/2021   MAMMOGRAM  03/18/2022   TETANUS/TDAP  08/03/2023   COLONOSCOPY (Pts 45-23yr Insurance coverage will need to be confirmed)  08/29/2031   Pneumonia Vaccine 71 Years old  Completed   DEXA SCAN  Completed   Hepatitis C Screening  Completed   HPV VACCINES  Aged Out    Health Maintenance  Health Maintenance Due  Topic Date Due   Zoster Vaccines- Shingrix (1 of 2) Never done   COVID-19 Vaccine (3 - Moderna risk series) 08/13/2019   INFLUENZA VACCINE  12/03/2021    Colorectal cancer screening: Type of screening: Colonoscopy. Completed 08/28/2021. Repeat every 10  years  Mammogram status: Completed 03/18/2021. Repeat every year  DEXA Scan: 06/11/2021  Lung Cancer Screening: (Low Dose CT Chest recommended if Age 73-80years, 30 pack-year currently smoking OR have quit w/in 15years.) does not qualify.   Lung Cancer Screening Referral: does not qualify  Additional Screening:  Hepatitis C Screening: does qualify; Completed 11/15/2015  Vision Screening: Recommended annual ophthalmology exams for early detection of glaucoma and other disorders of the eye. Is the patient up to date with their annual eye exam?  Yes  Who is the provider or what is the [facility] of the [facility] which the patient [facility] If pt is not established with a provider, would they like to be referred to a provider to establish care? No .   Dental Screening: Recommended annual dental exams for proper oral hygiene  Community Resource Referral / Chronic Care Management: CRR required this visit?  No   CCM required this visit?  No      Plan:     I have personally reviewed and noted the following in the patient's chart:   Medical and social history Use of alcohol, tobacco or illicit drugs  Current medications and supplements including opioid prescriptions.  Functional ability and status Nutritional status Physical activity Advanced directives List of other physicians Hospitalizations, surgeries, and ER visits in previous 12 months Vitals Screenings to include cognitive, depression, and falls Referrals and appointments  Ms. Harb , Thank you for taking time to come for your Medicare Wellness Visit. I appreciate your ongoing commitment to your health goals. Please review the following plan we discussed and let me know if I can assist you in the future.   These are the goals we discussed:  Goals      Activity and Exercise Increased     Evidence-based guidance:  Review current exercise levels.  Assess patient perspective on  exercise or activity level, barriers to increasing activity, motivation and readiness for change.  Recommend or set healthy exercise goal based on individual tolerance.  Encourage small steps toward making change in amount of exercise or activity.  Urge reduction of sedentary activities or screen time.  Promote group activities within the community or with family or support person.  Consider referral to rehabiliation therapist for assessment and exercise/activity plan.   Notes:      Weight (lb) < 165 lb (74.8 kg)     Patient states she would like to lose 25 lbs with healthy eating and physical activity         This is a list of the screening recommended for you and due dates:  Health Maintenance  Topic Date Due   Zoster (Shingles) Vaccine (1 of 2) Never done  COVID-19 Vaccine (3 - Moderna risk series) 08/13/2019   Flu Shot  12/03/2021   Mammogram  03/18/2022   Tetanus Vaccine  08/03/2023   Colon Cancer Screening  08/29/2031   Pneumonia Vaccine  Completed   DEXA scan (bone density measurement)  Completed   Hepatitis C Screening: USPSTF Recommendation to screen - Ages 51-79 yo.  Completed   HPV Vaccine  Aged [address], [place]   12/10/2021   Nurse Notes: 30 minute Non-face-to-face visit

## 2022-01-10 DIAGNOSIS — M76892 Other specified enthesopathies of left lower limb, excluding foot: Secondary | ICD-10-CM | POA: Diagnosis not present

## 2022-01-10 DIAGNOSIS — G8929 Other chronic pain: Secondary | ICD-10-CM | POA: Diagnosis not present

## 2022-01-10 DIAGNOSIS — M1712 Unilateral primary osteoarthritis, left knee: Secondary | ICD-10-CM | POA: Diagnosis not present

## 2022-01-10 DIAGNOSIS — M25462 Effusion, left knee: Secondary | ICD-10-CM | POA: Diagnosis not present

## 2022-01-10 DIAGNOSIS — M25562 Pain in left knee: Secondary | ICD-10-CM | POA: Diagnosis not present

## 2022-01-19 ENCOUNTER — Emergency Department: Payer: BC Managed Care – PPO

## 2022-01-19 ENCOUNTER — Emergency Department
Admission: EM | Admit: 2022-01-19 | Discharge: 2022-01-19 | Disposition: A | Discharge status: Home / Self Care | Payer: BC Managed Care – PPO | Attending: Emergency Medicine | Admitting: Emergency Medicine

## 2022-01-19 DIAGNOSIS — N183 Chronic kidney disease, stage 3 unspecified: Secondary | ICD-10-CM | POA: Diagnosis not present

## 2022-01-19 DIAGNOSIS — I129 Hypertensive chronic kidney disease with stage 1 through stage 4 chronic kidney disease, or unspecified chronic kidney disease: Secondary | ICD-10-CM | POA: Insufficient documentation

## 2022-01-19 DIAGNOSIS — M7989 Other specified soft tissue disorders: Secondary | ICD-10-CM | POA: Diagnosis not present

## 2022-01-19 DIAGNOSIS — M79671 Pain in right foot: Secondary | ICD-10-CM | POA: Diagnosis present

## 2022-01-19 DIAGNOSIS — M109 Gout, unspecified: Secondary | ICD-10-CM | POA: Insufficient documentation

## 2022-01-19 LAB — CBC WITH DIFFERENTIAL/PLATELET
Abs Immature Granulocytes: 0.02 10*3/uL (ref 0.00–0.07)
Basophils Absolute: 0.1 10*3/uL (ref 0.0–0.1)
Basophils Relative: 1 %
Eosinophils Absolute: 0 10*3/uL (ref 0.0–0.5)
Eosinophils Relative: 0 %
HCT: 35.8 % — ABNORMAL LOW (ref 36.0–46.0)
Hemoglobin: 11.7 g/dL — ABNORMAL LOW (ref 12.0–15.0)
Immature Granulocytes: 0 %
Lymphocytes Relative: 20 %
Lymphs Abs: 1.6 10*3/uL (ref 0.7–4.0)
MCH: 30.9 pg (ref 26.0–34.0)
MCHC: 32.7 g/dL (ref 30.0–36.0)
MCV: 94.5 fL (ref 80.0–100.0)
Monocytes Absolute: 0.8 10*3/uL (ref 0.1–1.0)
Monocytes Relative: 11 %
Neutro Abs: 5.2 10*3/uL (ref 1.7–7.7)
Neutrophils Relative %: 68 %
Platelets: 244 10*3/uL (ref 150–400)
RBC: 3.79 MIL/uL — ABNORMAL LOW (ref 3.87–5.11)
RDW: 13.7 % (ref 11.5–15.5)
WBC: 7.7 10*3/uL (ref 4.0–10.5)
nRBC: 0 % (ref 0.0–0.2)

## 2022-01-19 LAB — BASIC METABOLIC PANEL
Anion gap: 8 (ref 5–15)
BUN: 25 mg/dL — ABNORMAL HIGH (ref 8–23)
CO2: 25 mmol/L (ref 22–32)
Calcium: 9.8 mg/dL (ref 8.9–10.3)
Chloride: 106 mmol/L (ref 98–111)
Creatinine, Ser: 1.47 mg/dL — ABNORMAL HIGH (ref 0.44–1.00)
GFR, Estimated: 37 mL/min — ABNORMAL LOW (ref >60)
Glucose, Bld: 105 mg/dL — ABNORMAL HIGH (ref 70–99)
Potassium: 3.8 mmol/L (ref 3.5–5.1)
Sodium: 139 mmol/L (ref 135–145)

## 2022-01-19 LAB — URIC ACID: Uric Acid, Serum: 9.5 mg/dL — ABNORMAL HIGH (ref 2.5–7.1)

## 2022-01-19 MED ORDER — OXYCODONE HCL 5 MG PO TABS
5.0000 mg | ORAL_TABLET | Freq: Once | ORAL | Status: AC
  Administered 2022-01-19: 5 mg via ORAL
  Filled 2022-01-19: qty 1

## 2022-01-19 MED ORDER — ACETAMINOPHEN 325 MG PO TABS
650.0000 mg | ORAL_TABLET | Freq: Once | ORAL | Status: AC
  Administered 2022-01-19: 650 mg via ORAL
  Filled 2022-01-19: qty 2

## 2022-01-19 MED ORDER — PREDNISONE 10 MG PO TABS: 40.0000 mg | ORAL_TABLET | Freq: Every day | ORAL | 0 refills | Status: DC

## 2022-01-19 NOTE — Discharge Instructions (Addendum)
You may take the prednisone as prescribed.  Please return for any new, worsening, or change in symptoms or other concerns especially if the redness gets worse or if you develop red streak up your leg or fevers.  Please see your PCP this week for reevaluation.  It was a pleasure caring for you today.

## 2022-01-19 NOTE — ED Triage Notes (Signed)
Patient c/o swelling to right foot yesterday morning. Patient thinks she was bitten by a bug.

## 2022-01-19 NOTE — ED Provider Notes (Signed)
[facility] Provider Note    Event Date/Time   First MD Initiated Contact with Patient 01/19/22 1746     (approximate)   History   Foot Pain   HPI  Marissa Nelson is a 71 y.o. female with a past medical history of CKD stage III, hyperlipidemia, obesity, hypertension who presents today for evaluation of right foot swelling.  Patient thinks that she was bit by a bug this morning.  However, she reports that she has had pain at the base of her right great toe.  She denies any trauma or injuries.  She has not had a fever or chills.  She reports that she has been eating a lot of meat this week.  She has not had a history of gout before, though reports that many family members of hers have gout.  No fevers or chills.  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   Arthralgia 10/04/2016   Pre-diabetes 06/20/2016   Chronic kidney disease, stage III (moderate) (HCC) 11/15/2015   Vitamin D deficiency 10/29/2015   HLD (hyperlipidemia) 06/15/2015   Obesity (BMI 30.0-34.9) 06/15/2015   Chronic left shoulder pain 11/08/2014   Numbness and tingling in left hand 11/07/2014   Benign essential HTN 12/07/2006          Physical Exam   Triage Vital Signs: ED Triage Vitals  Enc Vitals Group     BP 01/19/22 1611 106/85     Pulse Rate 01/19/22 1611 82     Resp 01/19/22 1611 16     Temp 01/19/22 1611 99.1 F (37.3 C)     Temp Source 01/19/22 1611 Oral     SpO2 01/19/22 1611 100 %     Weight 01/19/22 1611 178 lb (80.7 kg)     Height 01/19/22 1611 '5\' 1"'$  (1.549 m)     Head Circumference --      Peak Flow --      Pain  Score 01/19/22 1610 10     Pain Loc --      Pain Edu? --      Excl. in [place]? --     Most recent vital signs: Vitals:   01/19/22 1614 01/19/22 1844  BP: 106/85 105/84  Pulse: 79 60  Resp: 16 16  Temp: 99.1 F (37.3 C) 98.2 F (36.8 C)  SpO2: 96% 98%    Physical Exam Vitals and nursing note reviewed.  Constitutional:      General: Awake and alert. No acute distress.    Appearance: Normal appearance. The patient is normal weight.  HENT:     Head: Normocephalic and atraumatic.     Mouth: Mucous membranes are moist.  Eyes:     General: PERRL. Normal EOMs        Right eye: No discharge.        Left eye: No discharge.     Conjunctiva/sclera: Conjunctivae normal.  Cardiovascular:     Rate and Rhythm: Normal rate and regular rhythm.     Pulses: Normal pulses.  Pulmonary:     Effort: Pulmonary effort is normal. No respiratory distress.     Breath sounds: Normal  breath sounds.  Abdominal:     Abdomen is soft. There is no abdominal tenderness. No rebound or guarding. No distention. Musculoskeletal:        General: No swelling. Normal range of motion.     Cervical back: Normal range of motion and neck supple.  Right foot: Erythema and tenderness at the base of the right great toe.  Normal pedal pulse.  Normal capillary refill.  Able to wiggle toe.  No lymphangitis.  No crepitus.  No medial or lateral malleoli or tenderness.  Able to plantarflex and dorsiflex against resistance.  No pain or abnormalities noted to tib-fib, knee, or hip.  Sensation intact light touch and equal to opposite throughout lower extremity Skin:    General: Skin is warm and dry.     Capillary Refill: Capillary refill takes less than 2 seconds.     Findings: No rash.  Neurological:     Mental Status: The patient is awake and alert.      ED Results / Procedures / Treatments   Labs (all labs ordered are listed, but only abnormal results are displayed) Labs Reviewed  CBC WITH DIFFERENTIAL/PLATELET -  Abnormal; Notable for the following components:      Result Value   RBC 3.79 (*)    Hemoglobin 11.7 (*)    HCT 35.8 (*)    All other components within normal limits  BASIC METABOLIC PANEL - Abnormal; Notable for the following components:   Glucose, Bld 105 (*)    BUN 25 (*)    Creatinine, Ser 1.47 (*)    GFR, Estimated 37 (*)    All other components within normal limits  URIC ACID - Abnormal; Notable for the following components:   Uric Acid, Serum 9.5 (*)    All other components within normal limits     EKG     RADIOLOGY I independently reviewed and interpreted imaging and agree with radiologists findings.     PROCEDURES:  Critical Care performed:   Procedures   MEDICATIONS ORDERED IN ED: Medications  oxyCODONE (Oxy IR/ROXICODONE) immediate release tablet 5 mg (5 mg Oral Given 01/19/22 1805)  acetaminophen (TYLENOL) tablet 650 mg (650 mg Oral Given 01/19/22 1805)     IMPRESSION / MDM / ASSESSMENT AND PLAN / ED COURSE  I reviewed the triage vital signs and the nursing notes.   Differential diagnosis includes, but is not limited to, podagra/gout, infection, joint infection, bony injury.  Patient is awake and alert, hemodynamically stable and afebrile.  Labs obtained demonstrate no leukocytosis, though significantly elevated uric acid.  X-ray demonstrates no acute osseous injury Without soft tissue gas.  Patient does admit to eating a lot of meat recently, and has several family members who have gout.  This is possibly a first presentation of gout.  However I discussed with her that I cannot rule out infection and we discussed very strict return precautions.  She has significant improvement with symptomatic management here.  She will be started on prednisone given that she has CKD and cannot take NSAIDs.  We discussed that if the redness worsens or if she develops red streak up her leg, fever, worsening pain or swelling, then she needs to return the emergency department  immediately.  We discussed that prednisone increases her risk for infection.  Do not suspect septic joint as she is able to range his joint, has no warmth or erythema to the joint, no fevers or chills. No evidence of neurological deficit or vascular compromise on exam.  No deformity or obvious ligamentous laxity on exam.  No constitutional symptoms or effusion to suggest septic joint. No history of immunosuppression. Overall well appearing, vital signs stable. Also advised that she have very close outpatient follow-up in 1 to 2 days for recheck of her foot.  Patient and her husband understand and agree with plan.  She was discharged in stable condition.  Differential diagnosis includes gout, effusion, arthritis, contusion, fracture, ligamental injury.  Return precautions and care instructions discussed. Outpatient follow-up advised. Patient agrees with plan of care.    Patient's presentation is most consistent with acute complicated illness / injury requiring diagnostic workup.     FINAL CLINICAL IMPRESSION(S) / ED DIAGNOSES   Final diagnoses:  Podagra     Rx / DC Orders   ED Discharge Orders          Ordered    predniSONE (DELTASONE) 10 MG tablet  Daily        01/19/22 1840             Note:  This document was prepared using Dragon voice recognition software and may include unintentional dictation errors.   Emeline Gins 01/19/22 1953    Lucillie Garfinkel, MD 01/21/22 [phone]

## 2022-01-22 ENCOUNTER — Encounter: Payer: Self-pay | Admitting: Family Medicine

## 2022-01-22 ENCOUNTER — Ambulatory Visit (INDEPENDENT_AMBULATORY_CARE_PROVIDER_SITE_OTHER): Payer: BC Managed Care – PPO | Admitting: Family Medicine

## 2022-01-22 VITALS — BP 132/76 | HR 77 | Temp 97.8°F | Resp 16 | Ht 61.0 in | Wt 174.5 lb

## 2022-01-22 DIAGNOSIS — I10 Essential (primary) hypertension: Secondary | ICD-10-CM | POA: Diagnosis not present

## 2022-01-22 DIAGNOSIS — Z09 Encounter for follow-up examination after completed treatment for conditions other than malignant neoplasm: Secondary | ICD-10-CM

## 2022-01-22 DIAGNOSIS — E790 Hyperuricemia without signs of inflammatory arthritis and tophaceous disease: Secondary | ICD-10-CM | POA: Diagnosis not present

## 2022-01-22 DIAGNOSIS — M109 Gout, unspecified: Secondary | ICD-10-CM

## 2022-01-22 DIAGNOSIS — Z23 Encounter for immunization: Secondary | ICD-10-CM

## 2022-01-22 MED ORDER — ALLOPURINOL 100 MG PO TABS: 100.0000 mg | ORAL_TABLET | Freq: Every day | ORAL | 6 refills | Status: DC

## 2022-01-22 MED ORDER — PREDNISONE 20 MG PO TABS: 40.0000 mg | ORAL_TABLET | Freq: Every day | ORAL | 0 refills | Status: AC

## 2022-01-22 MED ORDER — TRAMADOL HCL 50 MG PO TABS: 50.0000 mg | ORAL_TABLET | Freq: Two times a day (BID) | ORAL | 0 refills | Status: AC | PRN

## 2022-01-22 NOTE — Progress Notes (Signed)
 Name: Marissa Nelson   MRN: [id number]    DOB: 04/24/1951   Date:01/22/2022       Progress Note  Chief Complaint  Patient presents with   Follow-up    Gout Saturday on right foot has got worst, hard to walk     Subjective:   Marissa Nelson is a 71 y.o. female, presents to clinic for right foot/toe pain and possible gout f/up  Recent ER visit reviewed with pt today She was given 1 pain medicine while in the ER none for at home, she has been using her walkers to keep off her foot, she was given steroids she only took 20 mg on Sunday and then Monday and Tuesday took the full 40 mg dose, she has not taken the dose today she is only today started to notice very subtle improvement in her swelling, redness and slight decrease in pain she is also able to move her toes a little bit now Uric acid was elevated Lab Results  Component Value Date   LABURIC 9.5 (H) 01/19/2022  Uric acid high, not on lowering medications She recently went to ortho for knee pain with effusion - shes seen them at [facility] several times, I do not see studies on joint fluid done but Uric acid high with appt a few days ago - ED visit with acute toe pain/redness/swelling onset in the setting of increased meat consumption Here to f/up on those labs Pain redness and swelling is just starting to get a little better today  Severe pain with weight bearing-she is only been able to use Tylenol  She last saw Dr. Andrews 3 months ago for other routine Dx and refills-no recent changes, reviewed her recent nephrology appointment  HTN on maxide, metoprolol (? Not all prescribed by us - some historical) She is continue to hold losartan and she is on Maxide, metoprolol and diuretic only as needed, blood pressure at goal today BP Readings from Last 3 Encounters:  01/22/22 132/76  01/19/22 105/84  12/10/21 123/79   CKD- per nephrology last sCr with nephrol July 2023 1.81, eGFR 29 - reviewed last OV with Dr.  Singh  Patient is a 71 y.o. 2-Black or African American female   1. Hypertensive chronic kidney disease, benign, with chronic kidney disease stage I through stage IV, or unspecified  2. Stage 3b chronic kidney disease (HCC)  3. Anemia in chronic kidney disease  4. Edema of lower extremity   Chronic kidney disease stage IIIb secondary to hypertensive nephropathy. Labs from April show GFR 38. -Repeat labs today -Continue low-dose atorvastatin -Losartan on hold due to low blood pressure  Anemia in chronic kidney disease Repeat CBC today  Lower extremity edema and hypertension Trace edema today. Continue torsemide as needed. Patient is taking triamterene/HCTZ daily.  Return in about 5 months (around 04/14/2022).  Harmeet Singh, MD [facility]   Lipids last reviewed in December, she has upcoming appointment         Current Outpatient Medications:    acetaminophen (TYLENOL) 325 MG tablet, Take 650 mg by mouth every 6 (six) hours as needed for mild pain or headache. , Disp: , Rfl:    anastrozole (ARIMIDEX) 1 MG tablet, TAKE 1 TABLET BY MOUTH EVERY DAY, Disp: 90 tablet, Rfl: 1   Ascorbic Acid (VITAMIN C) 1000 MG tablet, Take 1,000 mg by mouth in the morning., Disp: , Rfl:    atorvastatin (LIPITOR) 20 MG tablet, Take 20 mg by mouth at   bedtime., Disp: , Rfl:    calcium carbonate (OS-CAL) 600 MG TABS tablet, Take 600 mg by mouth daily with breakfast., Disp: , Rfl:    Cholecalciferol (VITAMIN D3) 50 MCG (2000 UT) TABS, Take 2,000 Units by mouth daily., Disp: , Rfl:    Cyanocobalamin (VITAMIN B-12) 500 MCG SUBL, Place 500 mcg under the tongue daily., Disp: , Rfl:    latanoprost (XALATAN) 0.005 % ophthalmic solution, Place 1 drop into both eyes at bedtime., Disp: , Rfl:    losartan (COZAAR) 25 MG tablet, Take 25 mg by mouth daily., Disp: , Rfl:    metoprolol succinate (TOPROL-XL) 25 MG 24 hr tablet, Take 1 tablet by mouth once daily, Disp: 90 tablet, Rfl: 1    omeprazole (PRILOSEC) 40 MG capsule, Take 1 capsule (40 mg total) by mouth daily., Disp: 90 capsule, Rfl: 0   torsemide (DEMADEX) 10 MG tablet, Take 1 tablet (10 mg total) by mouth daily as needed (fluid)., Disp: 90 tablet, Rfl: 0   triamterene-hydrochlorothiazide (MAXZIDE-25) 37.5-25 MG tablet, Take 1 tablet by mouth daily., Disp: 90 tablet, Rfl: 3  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   Arthralgia 10/04/2016   Pre-diabetes 06/20/2016   Chronic kidney disease, stage III (moderate) (HCC) 11/15/2015   Vitamin D deficiency 10/29/2015   HLD (hyperlipidemia) 06/15/2015   Obesity (BMI 30.0-34.9) 06/15/2015   Chronic left shoulder pain 11/08/2014   Numbness and tingling in left hand 11/07/2014   Benign essential HTN 12/07/2006    Past Surgical History:  Procedure Laterality Date   ABDOMINAL HYSTERECTOMY     BREAST BIOPSY Right 01/15/2016    cylinder shape calcs UOQ, FIBROADENOMATOUS   BREAST BIOPSY Right 12/30/2016   stereo bx for calcifications: DCIS UOQ, ribbon shaped   BREAST BIOPSY Right 01/08/2017   LOQ x shaped, FIBROADENOMATOUS CHANGE WITH   BREAST BIOPSY Left 03/09/2019   calcs bx, x marker neg   BREAST LUMPECTOMY Right 01/30/2018   DCIS clear margins   BREAST LUMPECTOMY WITH NEEDLE LOCALIZATION Right 01/30/2017   Procedure: BREAST LUMPECTOMY WITH NEEDLE LOCALIZATION;  Surgeon: Davis, Jason Evan, MD;  Location: [facility];  Service: General;  Laterality: Right;   COLONOSCOPY WITH PROPOFOL N/A 11/19/2016   Procedure: COLONOSCOPY WITH PROPOFOL;  Surgeon: Anna, Kiran, MD;  Location: [facility];  Service: Endoscopy;   Laterality: N/A;   COLONOSCOPY WITH PROPOFOL N/A 08/28/2021   Procedure: COLONOSCOPY WITH PROPOFOL;  Surgeon: Anna, Kiran, MD;  Location: [facility];  Service: Gastroenterology;  Laterality: N/A;   ESOPHAGOGASTRODUODENOSCOPY N/A 08/28/2021   Procedure: ESOPHAGOGASTRODUODENOSCOPY (EGD);  Surgeon: Anna, Kiran, MD;  Location: [facility];  Service: Gastroenterology;  Laterality: N/A;   ESOPHAGOGASTRODUODENOSCOPY (EGD) WITH PROPOFOL N/A 11/19/2016   Procedure: ESOPHAGOGASTRODUODENOSCOPY (EGD) WITH PROPOFOL;  Surgeon: Anna, Kiran, MD;  Location: [facility];  Service: Endoscopy;  Laterality: N/A;   GIVENS CAPSULE STUDY N/A 12/12/2016   Procedure: GIVENS CAPSULE STUDY;  Surgeon: Anna, Kiran, MD;  Location: [facility];  Service: Gastroenterology;  Laterality: N/A;   TOTAL HIP ARTHROPLASTY Right 10/11/2020   Procedure: TOTAL HIP ARTHROPLASTY ANTERIOR APPROACH;  Surgeon: Menz, Michael, MD;  Location: [facility];  Service: Orthopedics;  Laterality:   Right;   TOTAL MASTECTOMY Right 01/30/2017   Error    Family History  Problem Relation Age of Onset   Cancer Mother 64       liver cancer   Diabetes Mother    Hypertension Mother    Glomerulonephritis Father    Diabetes Sister    Glomerulonephritis Sister    Cancer Sister 34       throat cancer   Heart disease Sister    Diabetes Sister    Diabetes Sister    COPD Sister    Diabetes Sister    Breast cancer Cousin        Currently undergoing systemic chemotherapy (01/2017)   Varicose Veins Neg Hx     Social History   Tobacco Use   Smoking status: Never   Smokeless tobacco: Never  Vaping Use   Vaping Use: Never used  Substance Use Topics   Alcohol use: No    Alcohol/week: 0.0 standard drinks of alcohol   Drug use: No     Allergies  Allergen Reactions   Penicillins Anaphylaxis   Shellfish Allergy Swelling    angioedema    Health Maintenance  Topic Date Due   Zoster Vaccines- Shingrix (1 of 2) Never done   COVID-19  Vaccine (3 - Moderna risk series) 08/13/2019   INFLUENZA VACCINE  12/03/2021   MAMMOGRAM  03/18/2022   TETANUS/TDAP  08/03/2023   COLONOSCOPY (Pts 45-15yr Insurance coverage will need to be confirmed)  08/29/2031   Pneumonia Vaccine 71 Years old  Completed   DEXA SCAN  Completed   Hepatitis C Screening  Completed   HPV VACCINES  Aged Out    Chart Review Today: I personally reviewed active problem list, medication list, allergies, family history, social history, health maintenance, notes from last encounter, lab results, imaging with the patient/caregiver today.   Review of Systems  Constitutional: Negative.   HENT: Negative.    Eyes: Negative.   Respiratory: Negative.    Cardiovascular: Negative.   Gastrointestinal: Negative.   Endocrine: Negative.   Genitourinary: Negative.   Musculoskeletal: Negative.   Skin: Negative.   Allergic/Immunologic: Negative.   Neurological: Negative.   Hematological: Negative.   Psychiatric/Behavioral: Negative.    All other systems reviewed and are negative.    Objective:   Vitals:   01/22/22 0929  BP: 132/76  Pulse: 77  Resp: 16  Temp: 97.8 F (36.6 C)  TempSrc: Oral  SpO2: 98%  Weight: 174 lb 8 oz (79.2 kg)  Height: 5' 1" (1.549 m)    Body mass index is 32.97 kg/m.  Physical Exam Vitals and nursing note reviewed.  Constitutional:      General: She is not in acute distress.    Appearance: Normal appearance. She is well-developed, well-groomed and overweight. She is not ill-appearing, toxic-appearing or diaphoretic.  HENT:     Head: Normocephalic and atraumatic.     Nose: Nose normal.  Eyes:     General:        Right eye: No discharge.        Left eye: No discharge.     Conjunctiva/sclera: Conjunctivae normal.  Neck:     Trachea: No tracheal deviation.  Cardiovascular:     Rate and Rhythm: Normal rate and regular rhythm.  Pulmonary:     Effort: Pulmonary effort is normal. No respiratory distress.     Breath sounds:  No stridor.  Musculoskeletal:     Right foot: Decreased range of motion.  Feet:  Comments: Right foot with edema and erythema over first MTP, minimal range of motion of toes due to pain, ambulates with use of cane, antalgic gait Skin:    General: Skin is warm and dry.     Findings: No rash.  Neurological:     Mental Status: She is alert.     Motor: No abnormal muscle tone.     Coordination: Coordination normal.  Psychiatric:        Behavior: Behavior normal. Behavior is cooperative.         Assessment & Plan:   1. Hyperuricemia Lab Results  Component Value Date   LABURIC 9.5 (H) 01/19/2022  Uric acid previously elevated and on diagnosis list, recently checked uric acid was high in the ER setting Plan to start allopurinol and recheck uric acid in about 1 month, increase dose if needed with the uric acid goal of less than 6.0 Reviewed low purine diet, she was given information and handout in the ER setting so we did not repeat this today She continues to have severe pain and today would be the fourth dose of her steroids so she was given 2 more days of steroids and some tramadol to take for severe pain, she did note that the swelling and redness just started to improve go down a little bit and she has some movement in her foot -so this may be the beginning of the steroids working for her  Since this is her first gouty flare we discussed future management may include adjusting allopurinol dose, continuing to work on avoiding diet triggers are high purine foods, we may need to look into her blood pressure medications or diuretics and ensure that they are not triggering gouty flares, for now we will leave all of her other medications the same  - allopurinol (ZYLOPRIM) 100 MG tablet; Take 1 tablet (100 mg total) by mouth daily.  Dispense: 30 tablet; Refill: 6 - Uric acid - predniSONE (DELTASONE) 20 MG tablet; Take 2 tablets (40 mg total) by mouth daily with breakfast for 3 days.   Dispense: 6 tablet; Refill: 0 - traMADol (ULTRAM) 50 MG tablet; Take 1 tablet (50 mg total) by mouth every 12 (twelve) hours as needed for up to 3 days.  Dispense: 6 tablet; Refill: 0  2. Acute gout involving toe of right foot, unspecified cause See #1 Very slight improvement with prednisone, explained to her that prednisone is the only medication she can take right now for gouty flare because of her renal function Work note given for the week  - allopurinol (ZYLOPRIM) 100 MG tablet; Take 1 tablet (100 mg total) by mouth daily.  Dispense: 30 tablet; Refill: 6 - Uric acid - predniSONE (DELTASONE) 20 MG tablet; Take 2 tablets (40 mg total) by mouth daily with breakfast for 3 days.  Dispense: 6 tablet; Refill: 0 - traMADol (ULTRAM) 50 MG tablet; Take 1 tablet (50 mg total) by mouth every 12 (twelve) hours as needed for up to 3 days.  Dispense: 6 tablet; Refill: 0  3. Benign essential HTN Blood pressure at goal today, reviewed nephrology office visit and they are continuing to hold losartan and blood pressure is managed by Maxide and metoprolol with use of torsemide as needed  4. Encounter for examination following treatment at hospital Reviewed all ER records lab results and discussed with the patient today - Uric acid     Return for 1 month recheck of labs, appt if needed for sx.   Leisa Tapia, PA-C   01/22/22 9:51 AM

## 2022-01-28 ENCOUNTER — Telehealth: Payer: Self-pay | Admitting: Family Medicine

## 2022-01-28 NOTE — Telephone Encounter (Signed)
Pt called in to ask. Pt says that she was seen on 9/20 for gout.  Pt says that her brother also has gout. Pt says that he is prescribed colchicine 0.6 mg. Pt says that he told her that it helps him a lot. Pt would like to know if provider would prescribe this for her as well?    Please advise.

## 2022-01-29 NOTE — Telephone Encounter (Signed)
No answer from pt left detailed vm per DPR.

## 2022-02-01 ENCOUNTER — Emergency Department
Admission: EM | Admit: 2022-02-01 | Discharge: 2022-02-01 | Disposition: A | Discharge status: Home / Self Care | Payer: BC Managed Care – PPO | Attending: Student in an Organized Health Care Education/Training Program | Admitting: Student in an Organized Health Care Education/Training Program

## 2022-02-01 ENCOUNTER — Emergency Department: Payer: BC Managed Care – PPO

## 2022-02-01 ENCOUNTER — Other Ambulatory Visit: Payer: Self-pay

## 2022-02-01 DIAGNOSIS — W232XXA Caught, crushed, jammed or pinched between a moving and stationary object, initial encounter: Secondary | ICD-10-CM | POA: Diagnosis not present

## 2022-02-01 DIAGNOSIS — M79674 Pain in right toe(s): Secondary | ICD-10-CM | POA: Diagnosis present

## 2022-02-01 DIAGNOSIS — I129 Hypertensive chronic kidney disease with stage 1 through stage 4 chronic kidney disease, or unspecified chronic kidney disease: Secondary | ICD-10-CM | POA: Insufficient documentation

## 2022-02-01 DIAGNOSIS — Y990 Civilian activity done for income or pay: Secondary | ICD-10-CM | POA: Diagnosis not present

## 2022-02-01 DIAGNOSIS — S90111A Contusion of right great toe without damage to nail, initial encounter: Secondary | ICD-10-CM | POA: Diagnosis not present

## 2022-02-01 DIAGNOSIS — M19071 Primary osteoarthritis, right ankle and foot: Secondary | ICD-10-CM | POA: Diagnosis not present

## 2022-02-01 DIAGNOSIS — S99921A Unspecified injury of right foot, initial encounter: Secondary | ICD-10-CM | POA: Diagnosis not present

## 2022-02-01 DIAGNOSIS — N189 Chronic kidney disease, unspecified: Secondary | ICD-10-CM | POA: Insufficient documentation

## 2022-02-01 DIAGNOSIS — M7989 Other specified soft tissue disorders: Secondary | ICD-10-CM | POA: Diagnosis not present

## 2022-02-01 NOTE — ED Triage Notes (Signed)
Pt comes with c/o right big toe pain. Pt states she hit it on something at work. Pt states it now hurts and is painful. Pt unsure if she broke it.

## 2022-02-01 NOTE — ED Provider Notes (Signed)
[facility] Emergency Department Provider Note     Event Date/Time   First MD Initiated Contact with Patient 02/01/22 1720     (approximate)   History   Foot Pain   HPI  Marissa Nelson is a 71 y.o. female with a history of hypertension, HLD, CKD, and gout presents to the ED with complaints of right great toe pain.  She reports she hit it on the wheel of a large industrial stove while at work.  This was after the patient had fashion her work shoes in such a way to decrease the tension on her great toe MTP.  She presents to the ED for evaluation of ongoing pain at the dorsal lateral right first MTP.  She denies any nail avulsion or laceration.     Physical Exam   Triage Vital Signs: ED Triage Vitals  Enc Vitals Group     BP 02/01/22 1649 136/77     Pulse Rate 02/01/22 1649 80     Resp 02/01/22 1649 17     Temp 02/01/22 1649 98.8 F (37.1 C)     Temp src --      SpO2 02/01/22 1649 100 %     Weight --      Height --      Head Circumference --      Peak Flow --      Pain Score 02/01/22 1648 8     Pain Loc --      Pain Edu? --      Excl. in [place]? --     Most recent vital signs: Vitals:   02/01/22 1649 02/01/22 1845  BP: 136/77 133/84  Pulse: 80 80  Resp: 17 14  Temp: 98.8 F (37.1 C)   SpO2: 100% 100%    General Awake, no distress.  CV:  Good peripheral perfusion.  RESP:  Normal effort.  ABD:  No distention.  MSK:  Right foot without obvious deformity or dislocation.  Patient with some subtle soft tissue swelling around the first right MTP.   ED Results / Procedures / Treatments   Labs (all labs ordered are listed, but only abnormal results are displayed) Labs Reviewed - No data to display   EKG   RADIOLOGY  I personally viewed and evaluated these images as part of my medical decision making, as well as reviewing the written report by the radiologist.  ED Provider Interpretation: No acute findings  DG Toe Great  Right  Result Date: 02/01/2022 CLINICAL DATA:  Status post trauma. EXAM: RIGHT GREAT TOE COMPARISON:  None Available. FINDINGS: There is no evidence of an acute fracture or dislocation. A small area of cortical exostosis is seen along the medial base of the distal phalanx of the right great toe. Moderate severity degenerative changes are seen involving the metatarsophalangeal joint of the right great toe. An associated hallux valgus deformity is seen. Moderate severity adjacent soft tissue swelling is present. Mild soft tissue swelling is also seen along the plantar aspect of the right great toe. IMPRESSION: 1. No acute osseous abnormality. 2. Moderate severity degenerative changes involving the metatarsophalangeal joint of the right great toe. Electronically Signed   By: Virgina Norfolk M.D.   On: 02/01/2022 17:57     PROCEDURES:  Critical Care performed: No  Procedures   MEDICATIONS ORDERED IN ED: Medications - No data to display   IMPRESSION / MDM / [place] / ED COURSE  I reviewed the triage vital  signs and the nursing notes.                              Differential diagnosis includes, but is not limited to, foot contusion, foot abrasion, foot fracture, foot sprain, nail avulsion, gout  Patient's presentation is most consistent with acute complicated illness / injury requiring diagnostic workup.  Patient's diagnosis is consistent with foot contusion.  Radiologic evidence of any acute fracture or dislocation based on my review of images.  Patient does have significant degenerative changes to the right foot, and right great toe specifically.  Patient will be discharged home with a walking boot and instructions to take OTC Tylenol as needed. Patient is to follow up with a Ortho as needed or otherwise directed. Patient is given ED precautions to return to the ED for any worsening or new symptoms.     FINAL CLINICAL IMPRESSION(S) / ED DIAGNOSES   Final diagnoses:   Contusion of right great toe without damage to nail, initial encounter     Rx / DC Orders   ED Discharge Orders     None        Note:  This document was prepared using Dragon voice recognition software and may include unintentional dictation errors.    Melvenia Needles, PA-C 02/01/22 2244    Merlyn Lot, MD 02/04/22 [phone]

## 2022-02-01 NOTE — Discharge Instructions (Addendum)
Your exam and x-ray are consistent with a foot contusion.  No evidence of a fracture or dislocation.  Wear the cam boot to ambulate as needed.  Follow-up with primary provider or podiatry for ongoing symptoms.

## 2022-02-06 ENCOUNTER — Telehealth: Payer: Self-pay

## 2022-02-06 NOTE — Telephone Encounter (Signed)
        Patient  visited [place] on 9/30    Telephone encounter attempt :  1st  A HIPAA compliant voice message was left requesting a return call.  Instructed patient to call back     [facility]on[facility]le [facility] Care Guide, [place], Care Management  [phone] [address], [place], [place] [address] Phone: [phone] Email: [email or web address]       

## 2022-02-12 DIAGNOSIS — H524 Presbyopia: Secondary | ICD-10-CM | POA: Diagnosis not present

## 2022-02-24 ENCOUNTER — Ambulatory Visit (INDEPENDENT_AMBULATORY_CARE_PROVIDER_SITE_OTHER): Payer: BC Managed Care – PPO

## 2022-02-24 DIAGNOSIS — Z23 Encounter for immunization: Secondary | ICD-10-CM | POA: Diagnosis not present

## 2022-02-27 DIAGNOSIS — G8929 Other chronic pain: Secondary | ICD-10-CM | POA: Diagnosis not present

## 2022-02-27 DIAGNOSIS — M25462 Effusion, left knee: Secondary | ICD-10-CM | POA: Diagnosis not present

## 2022-02-27 DIAGNOSIS — M1712 Unilateral primary osteoarthritis, left knee: Secondary | ICD-10-CM | POA: Diagnosis not present

## 2022-03-05 ENCOUNTER — Encounter: Payer: Self-pay | Admitting: Oncology

## 2022-03-05 ENCOUNTER — Inpatient Hospital Stay: Payer: BC Managed Care – PPO | Attending: Oncology | Admitting: Oncology

## 2022-03-05 VITALS — BP 104/71 | HR 84 | Temp 97.5°F | Resp 18 | Wt 171.0 lb

## 2022-03-05 DIAGNOSIS — Z08 Encounter for follow-up examination after completed treatment for malignant neoplasm: Secondary | ICD-10-CM | POA: Diagnosis not present

## 2022-03-05 DIAGNOSIS — Z923 Personal history of irradiation: Secondary | ICD-10-CM | POA: Diagnosis not present

## 2022-03-05 DIAGNOSIS — D0511 Intraductal carcinoma in situ of right breast: Secondary | ICD-10-CM | POA: Insufficient documentation

## 2022-03-05 DIAGNOSIS — Z86000 Personal history of in-situ neoplasm of breast: Secondary | ICD-10-CM

## 2022-03-05 DIAGNOSIS — Z5181 Encounter for therapeutic drug level monitoring: Secondary | ICD-10-CM

## 2022-03-05 DIAGNOSIS — Z79811 Long term (current) use of aromatase inhibitors: Secondary | ICD-10-CM | POA: Diagnosis not present

## 2022-03-05 DIAGNOSIS — Z800 Family history of malignant neoplasm of digestive organs: Secondary | ICD-10-CM | POA: Diagnosis not present

## 2022-03-05 NOTE — Progress Notes (Signed)
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 ER greater than 90% positive and PR was 11-50% positive. Biopsy of the right lower quadrant of the right breast showed fibroadenomatous changes with calcifications.Patient underwent lumpectomy on 01/30/2017 which showed a 15 mm DCIS, grade 2 with comedonecrosis. Margins were -3 mm from the closest medial margin lymph nodes were not submitted. Pathology stage pTisNx. ER greater than 90% positive PR 11-50% positive. Patient completed adjuvant RT and started arimidex in jan 2019  Interval history-patient is tolerating Arimidex well without any significant side effects.  She is also compliant with her calcium and vitamin D.  Occasional pain at the lumpectomy site but denies other breast concerns at this time.  ECOG PS- 1 Pain scale- 0   Review of systems- Review of Systems  Constitutional:  Negative for chills, fever, malaise/fatigue and weight loss.  HENT:  Negative for congestion, ear discharge and nosebleeds.   Eyes:  Negative for blurred vision.  Respiratory:  Negative for cough, hemoptysis, sputum production, shortness of breath and wheezing.   Cardiovascular:  Negative for chest pain, palpitations, orthopnea and claudication.  Gastrointestinal:  Negative for abdominal pain, blood in stool, constipation,  diarrhea, heartburn, melena, nausea and vomiting.  Genitourinary:  Negative for dysuria, flank pain, frequency, hematuria and urgency.  Musculoskeletal:  Negative for back pain, joint pain and myalgias.  Skin:  Negative for rash.  Neurological:  Negative for dizziness, tingling, focal weakness, seizures, weakness and headaches.  Endo/Heme/Allergies:  Does not bruise/bleed easily.  Psychiatric/Behavioral:  Negative for depression and suicidal ideas. The patient does not have insomnia.       Allergies  Allergen Reactions   Penicillins Anaphylaxis   Shellfish Allergy Swelling    angioedema     Past Medical History:  Diagnosis Date   Abnormal mammogram of right breast 01/17/2016   Recommendation: Six month follow-up diagnostic mammogram of the right breast for an additional group of probably benign Calcifications. Sept 2017 --> due March 2018   Arthralgia 10/04/2016   Arthritis    left hand   Breast cancer ([place]) 2018   right breast DCIS with rad tx   Breast cancer screening 11/15/2015   Chronic kidney disease (CKD), stage III (moderate) (HCC) 11/15/2015   Chronic left shoulder pain 11/08/2014   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   RIGHT, August 2018   Family history of diabetes mellitus 06/12/2016   Mother and several other relatives   HLD (hyperlipidemia) 06/15/2015   Hx of right breast biopsy 01/17/2016   Hyperlipidemia    Hypertension    Medication monitoring encounter 11/15/2015   Motion sickness    ocean ships   Numbness and tingling in left hand 11/07/2014   Obesity 10/25/2015   Obesity (BMI 30.0-34.9) 10/25/2015   Personal history of radiation therapy 2018   right breast ca   PONV (postoperative nausea and vomiting)    patient stated she vomits a  lot after anesthsia   Positive ANA (antinuclear antibody) 10/04/2016   Prediabetes    Preventative health care 11/15/2015   Vitamin D deficiency 10/29/2015     Past Surgical History:  Procedure Laterality Date    ABDOMINAL HYSTERECTOMY     BREAST BIOPSY Right 01/15/2016    cylinder shape calcs UOQ, FIBROADENOMATOUS   BREAST BIOPSY Right 12/30/2016   stereo bx for calcifications: DCIS UOQ, ribbon shaped   BREAST BIOPSY Right 01/08/2017   LOQ x shaped, FIBROADENOMATOUS CHANGE WITH   BREAST BIOPSY Left 03/09/2019   calcs bx, x marker neg   BREAST LUMPECTOMY Right 01/30/2018   DCIS clear margins   BREAST LUMPECTOMY WITH NEEDLE LOCALIZATION Right 01/30/2017   Procedure: BREAST LUMPECTOMY WITH NEEDLE LOCALIZATION;  Surgeon: Vickie Epley, MD;  Location: [facility];  Service: General;  Laterality: Right;   COLONOSCOPY WITH PROPOFOL N/A 11/19/2016   Procedure: COLONOSCOPY WITH PROPOFOL;  Surgeon: Jonathon Bellows, MD;  Location: [place];  Service: Endoscopy;  Laterality: N/A;   COLONOSCOPY WITH PROPOFOL N/A 08/28/2021   Procedure: COLONOSCOPY WITH PROPOFOL;  Surgeon: Jonathon Bellows, MD;  Location: [facility] ENDOSCOPY;  Service: Gastroenterology;  Laterality: N/A;   ESOPHAGOGASTRODUODENOSCOPY N/A 08/28/2021   Procedure: ESOPHAGOGASTRODUODENOSCOPY (EGD);  Surgeon: Jonathon Bellows, MD;  Location: [facility] ENDOSCOPY;  Service: Gastroenterology;  Laterality: N/A;   ESOPHAGOGASTRODUODENOSCOPY (EGD) WITH PROPOFOL N/A 11/19/2016   Procedure: ESOPHAGOGASTRODUODENOSCOPY (EGD) WITH PROPOFOL;  Surgeon: Jonathon Bellows, MD;  Location: [place];  Service: Endoscopy;  Laterality: N/A;   GIVENS CAPSULE STUDY N/A 12/12/2016   Procedure: GIVENS CAPSULE STUDY;  Surgeon: Jonathon Bellows, MD;  Location: [facility] ENDOSCOPY;  Service: Gastroenterology;  Laterality: N/A;   TOTAL HIP ARTHROPLASTY Right 10/11/2020   Procedure: TOTAL HIP ARTHROPLASTY ANTERIOR APPROACH;  Surgeon: Hessie Knows, MD;  Location: [facility];  Service: Orthopedics;  Laterality: Right;   TOTAL MASTECTOMY Right 01/30/2017   Error    Social History   Socioeconomic History   Marital status: Married    Spouse name: Archie Atilano   Number of children: 2   Years of  education: Not on file   Highest education level: Not on file  Occupational History   Occupation: Works in [employer] [place], [place])    Employer: [employer]  Tobacco Use   Smoking status: Never   Smokeless tobacco: Never  Vaping Use   Vaping Use: Never used  Substance and Sexual Activity   Alcohol use: No    Alcohol/week: 0.0 standard drinks of alcohol   Drug use: No   Sexual activity: Not Currently  Other Topics Concern   Not on file  Social History Narrative   Not on file   Social Determinants of Health   Financial Resource Strain: Low Risk  (12/10/2021)   Overall Financial Resource Strain (CARDIA)    Difficulty of Paying Living Expenses: Not hard at all  Food Insecurity: No Food Insecurity (12/10/2021)   Hunger Vital Sign    Worried About Running Out of Food in the Last Year: Never true    [place] in the Last Year: Never true  Transportation Needs: No Transportation Needs (12/10/2021)   PRAPARE - Hydrologist (Medical): No    Lack of Transportation (Non-Medical): No  Physical Activity: Insufficiently Active (12/06/2020)   Exercise Vital Sign    Days of Exercise per Week: 7 days    Minutes of Exercise per Session: 20 min  Stress: No Stress Concern Present (12/10/2021)  [place] Questionnaire    Feeling of Stress : Not at all  Social Connections: Socially Integrated (12/10/2021)   Social Connection and Isolation Panel [NHANES]    Frequency of Communication with Friends and Family: More than three times a week    Frequency of Social Gatherings with Friends and Family: Three times a week    Attends Religious Services: More than 4 times per year    Active Member of Clubs or Organizations: Yes    Attends Archivist Meetings: More than 4 times per year    Marital Status: Married  Human resources officer Violence: Not At Risk (12/10/2021)   Humiliation, Afraid, Rape, and  Kick questionnaire    Fear of Current or Ex-Partner: No    Emotionally Abused: No    Physically Abused: No    Sexually Abused: No    Family History  Problem Relation Age of Onset   Cancer Mother 58       liver cancer   Diabetes Mother    Hypertension Mother    Glomerulonephritis Father    Diabetes Sister    Glomerulonephritis Sister    Cancer Sister 23       throat cancer   Heart disease Sister    Diabetes Sister    Diabetes Sister    COPD Sister    Diabetes Sister    Breast cancer Cousin        Currently undergoing systemic chemotherapy (01/2017)   Varicose Veins Neg Hx      Current Outpatient Medications:    acetaminophen (TYLENOL) 325 MG tablet, Take 650 mg by mouth every 6 (six) hours as needed for mild pain or headache. , Disp: , Rfl:    allopurinol (ZYLOPRIM) 100 MG tablet, Take 1 tablet (100 mg total) by mouth daily., Disp: 30 tablet, Rfl: 6   anastrozole (ARIMIDEX) 1 MG tablet, TAKE 1 TABLET BY MOUTH EVERY DAY, Disp: 90 tablet, Rfl: 1   Ascorbic Acid (VITAMIN C) 1000 MG tablet, Take 1,000 mg by mouth in the morning., Disp: , Rfl:    atorvastatin (LIPITOR) 20 MG tablet, Take 20 mg by mouth at bedtime., Disp: , Rfl:    calcium carbonate (OS-CAL) 600 MG TABS tablet, Take 600 mg by mouth daily with breakfast., Disp: , Rfl:    Cholecalciferol (VITAMIN D3) 50 MCG (2000 UT) TABS, Take 2,000 Units by mouth daily., Disp: , Rfl:    Cyanocobalamin (VITAMIN B-12) 500 MCG SUBL, Place 500 mcg under the tongue daily., Disp: , Rfl:    latanoprost (XALATAN) 0.005 % ophthalmic solution, Place 1 drop into both eyes at bedtime., Disp: , Rfl:    losartan (COZAAR) 25 MG tablet, Take 25 mg by mouth daily., Disp: , Rfl:    metoprolol succinate (TOPROL-XL) 25 MG 24 hr tablet, Take 1 tablet by mouth once daily, Disp: 90 tablet, Rfl: 1   omeprazole (PRILOSEC) 40 MG capsule, Take 1 capsule (40 mg total) by mouth daily., Disp: 90 capsule, Rfl: 0   torsemide (DEMADEX) 10 MG tablet, Take 1 tablet  (10 mg total) by mouth daily as needed (fluid)., Disp: 90 tablet, Rfl: 0   triamterene-hydrochlorothiazide (MAXZIDE-25) 37.5-25 MG tablet, Take 1 tablet by mouth daily., Disp: 90 tablet, Rfl: 3  Physical exam:  Vitals:   03/05/22 1418  BP: 104/71  Pulse: 84  Resp: 18  Temp: (!) 97.5 F (36.4 C)  TempSrc: Tympanic  SpO2: 100%  Weight: 171 lb (77.6 kg)   Physical  Exam Constitutional:      General: She is not in acute distress. Cardiovascular:     Rate and Rhythm: Normal rate and regular rhythm.     Heart sounds: Normal heart sounds.  Pulmonary:     Effort: Pulmonary effort is normal.     Breath sounds: Normal breath sounds.  Abdominal:     General: Bowel sounds are normal.     Palpations: Abdomen is soft.  Skin:    General: Skin is warm and dry.  Neurological:     Mental Status: She is alert and oriented to person, place, and time.    Breast exam was performed in seated and lying down position. Patient is status post right lumpectomy with a well-healed surgical scar. No evidence of any palpable masses. No evidence of axillary adenopathy. No evidence of any palpable masses or lumps in the left breast. No evidence of leftt axillary adenopathy      Latest Ref Rng & Units 01/19/2022    5:58 PM  CMP  Glucose 70 - 99 mg/dL 105   BUN 8 - 23 mg/dL 25   Creatinine 0.44 - 1.00 mg/dL 1.47   Sodium 135 - 145 mmol/L 139   Potassium 3.5 - 5.1 mmol/L 3.8   Chloride 98 - 111 mmol/L 106   CO2 22 - 32 mmol/L 25   Calcium 8.9 - 10.3 mg/dL 9.8       Latest Ref Rng & Units 01/19/2022    5:58 PM  CBC  WBC 4.0 - 10.5 K/uL 7.7   Hemoglobin 12.0 - 15.0 g/dL 11.7   Hematocrit 36.0 - 46.0 % 35.8   Platelets 150 - 400 K/uL 244     No images are attached to the encounter.  No results found.   Assessment and plan- Patient is a 71 y.o. female with history of ER positive right DCIS status postlumpectomy adjuvant radiation therapy and currently on Arimidex.  This is a routine follow-up  visit  Clinically patient is doing well with no concerning signs and symptoms of recurrence based on today's exam.  Patient will complete 5 years of Arimidex in January 2024 and following that she does not have to take it anymore.  I will see her back in 6 months no labs and following that she has the option of either following up with me on a yearly basis or continue to follow-up with her primary care doctor.Her bone density scan in February 2023 was normal.  She will be due for a mammogram in 2023 November which will be coordinated by surgery.   Visit Diagnosis 1. Encounter for follow-up surveillance of ductal carcinoma in situ (DCIS) of breast   2. Visit for monitoring Arimidex therapy      Dr. Randa Evens, MD, MPH [facility] [phone] 03/05/2022 5:44 PM

## 2022-03-05 NOTE — Progress Notes (Signed)
Patient here for oncology follow-up appointment, concerns of back pain

## 2022-03-19 ENCOUNTER — Ambulatory Visit
Admission: RE | Admit: 2022-03-19 | Discharge: 2022-03-19 | Disposition: A | Discharge status: Home / Self Care | Payer: BC Managed Care – PPO | Source: Ambulatory Visit | Attending: Family Medicine | Admitting: Family Medicine

## 2022-03-19 DIAGNOSIS — Z1231 Encounter for screening mammogram for malignant neoplasm of breast: Secondary | ICD-10-CM | POA: Insufficient documentation

## 2022-04-14 DIAGNOSIS — R600 Localized edema: Secondary | ICD-10-CM | POA: Diagnosis not present

## 2022-04-14 DIAGNOSIS — I10 Essential (primary) hypertension: Secondary | ICD-10-CM | POA: Diagnosis not present

## 2022-04-14 DIAGNOSIS — N184 Chronic kidney disease, stage 4 (severe): Secondary | ICD-10-CM | POA: Diagnosis not present

## 2022-04-14 DIAGNOSIS — D631 Anemia in chronic kidney disease: Secondary | ICD-10-CM | POA: Diagnosis not present

## 2022-04-22 ENCOUNTER — Ambulatory Visit: Payer: BC Managed Care – PPO | Admitting: Internal Medicine

## 2022-05-01 ENCOUNTER — Ambulatory Visit (INDEPENDENT_AMBULATORY_CARE_PROVIDER_SITE_OTHER): Payer: BC Managed Care – PPO | Admitting: Family Medicine

## 2022-05-01 ENCOUNTER — Encounter: Payer: Self-pay | Admitting: Family Medicine

## 2022-05-01 VITALS — BP 126/72 | HR 91 | Temp 97.6°F | Resp 18 | Ht 61.0 in | Wt 172.3 lb

## 2022-05-01 DIAGNOSIS — N1832 Chronic kidney disease, stage 3b: Secondary | ICD-10-CM | POA: Diagnosis not present

## 2022-05-01 DIAGNOSIS — R600 Localized edema: Secondary | ICD-10-CM | POA: Diagnosis not present

## 2022-05-01 DIAGNOSIS — E785 Hyperlipidemia, unspecified: Secondary | ICD-10-CM | POA: Diagnosis not present

## 2022-05-01 DIAGNOSIS — I10 Essential (primary) hypertension: Secondary | ICD-10-CM | POA: Diagnosis not present

## 2022-05-01 DIAGNOSIS — Z5181 Encounter for therapeutic drug level monitoring: Secondary | ICD-10-CM

## 2022-05-01 DIAGNOSIS — Z6832 Body mass index (BMI) 32.0-32.9, adult: Secondary | ICD-10-CM

## 2022-05-01 DIAGNOSIS — E790 Hyperuricemia without signs of inflammatory arthritis and tophaceous disease: Secondary | ICD-10-CM

## 2022-05-01 DIAGNOSIS — E782 Mixed hyperlipidemia: Secondary | ICD-10-CM

## 2022-05-01 DIAGNOSIS — E559 Vitamin D deficiency, unspecified: Secondary | ICD-10-CM

## 2022-05-01 DIAGNOSIS — N183 Chronic kidney disease, stage 3 unspecified: Secondary | ICD-10-CM

## 2022-05-01 DIAGNOSIS — R1013 Epigastric pain: Secondary | ICD-10-CM

## 2022-05-01 DIAGNOSIS — M109 Gout, unspecified: Secondary | ICD-10-CM | POA: Diagnosis not present

## 2022-05-01 DIAGNOSIS — E669 Obesity, unspecified: Secondary | ICD-10-CM

## 2022-05-01 DIAGNOSIS — D631 Anemia in chronic kidney disease: Secondary | ICD-10-CM

## 2022-05-01 DIAGNOSIS — R7303 Prediabetes: Secondary | ICD-10-CM | POA: Diagnosis not present

## 2022-05-01 MED ORDER — METOPROLOL SUCCINATE ER 25 MG PO TB24: 25.0000 mg | ORAL_TABLET | Freq: Every day | ORAL | 3 refills | Status: DC

## 2022-05-01 NOTE — Assessment & Plan Note (Signed)
Blood pressure at goal today without reported hypotension Plan for the patient to ask her nephrologist if she is able to restart losartan for renal protection she is currently only on metoprolol

## 2022-05-01 NOTE — Assessment & Plan Note (Signed)
She started allopurinol 100 mg daily a few months ago recheck uric acid, last was 9.5

## 2022-05-01 NOTE — Assessment & Plan Note (Signed)
Lab Results  Component Value Date   HGBA1C 6.2 (H) 04/09/2021  She has been eating less and losing a little bit of weight recheck A1c today

## 2022-05-01 NOTE — Assessment & Plan Note (Signed)
Today she reports fatigue and body aches with Lipitor which she has not previously reported She discussed this with the nephrologist and he suggested she cut it in half she has cut the dose down from 20-10 but does continue to have some bothersome side effects We will recheck her labs and have her hold all statins for a few weeks We reviewed other possible treatment options

## 2022-05-01 NOTE — Assessment & Plan Note (Signed)
No recurrent gout episodes since the last office visit in September

## 2022-05-01 NOTE — Assessment & Plan Note (Signed)
Anemia did just slightly worsen with most recent hemoglobin down to 10.8 she is not currently on EPO

## 2022-05-01 NOTE — Assessment & Plan Note (Signed)
Last labs were several years ago and vitamin D was low at that time she has had worse renal function since then, recheck labs today

## 2022-05-01 NOTE — Assessment & Plan Note (Signed)
Reviewed most recent labs and office visit with nephrology

## 2022-05-01 NOTE — Progress Notes (Signed)
Name: Marissa Nelson   MRN: [id number]    DOB: 10-12-1950   Date:05/01/2022       Progress Note  Chief Complaint  Patient presents with   Follow-up     Subjective:   Marissa Nelson is a 71 y.o. female, presents to clinic for f/up on routine conditions  Gout - uric acid high last OV  Lab Results  Component Value Date   LABURIC 9.5 (H) 01/19/2022   CKD stage 3b - seen by nephrology - Dr. Holley Raring, last OV 04/14/2022 - reviewed 04/14/2022 eGFR 43 Last sCr 1.47, eGFR 37 Lab Results  Component Value Date   EGFR 38 (L) 08/15/2021   EGFR 33 (L) 04/09/2021   Anemia of chronic disease H/H was 11.7/35.8 Sept 2023, nephrology labs hgb went down to 10.8/31.8  Hypertension:  Currently managed on losartan 25 mg (held per nephro?), toprol xl 25 mg, triamterene/HCTZ and demedex prn for LE edema Pt reports good med compliance and denies any SE.   Blood pressure today is well controlled. BP Readings from Last 3 Encounters:  05/01/22 126/72  03/05/22 104/71  02/01/22 133/84   Pt denies CP, SOB, exertional sx, LE edema, palpitation, Ha's, visual disturbances, lightheadedness, hypotension, syncope. Dietary efforts for BP?  Low salt  Hyperlipidemia: Currently treated with lipitor 20, pt reports good med compliance Last Lipids: Lab Results  Component Value Date   CHOL 223 (H) 04/09/2021   HDL 61 04/09/2021   LDLCALC 140 (H) 04/09/2021   TRIG 104 04/09/2021   CHOLHDL 3.7 04/09/2021   - Denies: Chest pain, shortness of breath, myalgias, claudication  GERD on prilosec  Had BRBPR earlier this year and was referred to GI - Dr. Vicente Males- had colonoscopy and upper endoscopy done  She denies any abd pain or reflux, she isn't taking prilosec daily She will have some days that she has loss of appetite - but no nausea   Hx of prediabetes Lab Results  Component Value Date   HGBA1C 6.2 (H) 04/09/2021   Last vitamin D Lab Results  Component Value Date   VD25OH 24 (L) 05/12/2018     Lab Results  Component Value Date   VITAMINB12 828 05/12/2018      Current Outpatient Medications:    acetaminophen (TYLENOL) 325 MG tablet, Take 650 mg by mouth every 6 (six) hours as needed for mild pain or headache. , Disp: , Rfl:    allopurinol (ZYLOPRIM) 100 MG tablet, Take 1 tablet (100 mg total) by mouth daily., Disp: 30 tablet, Rfl: 6   anastrozole (ARIMIDEX) 1 MG tablet, TAKE 1 TABLET BY MOUTH EVERY DAY, Disp: 90 tablet, Rfl: 1   Ascorbic Acid (VITAMIN C) 1000 MG tablet, Take 1,000 mg by mouth in the morning., Disp: , Rfl:    atorvastatin (LIPITOR) 20 MG tablet, Take 20 mg by mouth at bedtime., Disp: , Rfl:    calcium carbonate (OS-CAL) 600 MG TABS tablet, Take 600 mg by mouth daily with breakfast., Disp: , Rfl:    Cholecalciferol (VITAMIN D3) 50 MCG (2000 UT) TABS, Take 2,000 Units by mouth daily., Disp: , Rfl:    Cyanocobalamin (VITAMIN B-12) 500 MCG SUBL, Place 500 mcg under the tongue daily., Disp: , Rfl:    latanoprost (XALATAN) 0.005 % ophthalmic solution, Place 1 drop into both eyes at bedtime., Disp: , Rfl:    losartan (COZAAR) 25 MG tablet, Take 25 mg by mouth daily., Disp: , Rfl:    metoprolol succinate (TOPROL-XL) 25 MG 24 hr  tablet, Take 1 tablet by mouth once daily, Disp: 90 tablet, Rfl: 1   omeprazole (PRILOSEC) 40 MG capsule, Take 1 capsule (40 mg total) by mouth daily., Disp: 90 capsule, Rfl: 0   torsemide (DEMADEX) 10 MG tablet, Take 1 tablet (10 mg total) by mouth daily as needed (fluid)., Disp: 90 tablet, Rfl: 0   triamterene-hydrochlorothiazide (MAXZIDE-25) 37.5-25 MG tablet, Take 1 tablet by mouth daily., Disp: 90 tablet, Rfl: 3  Patient Active Problem List   Diagnosis Date Noted   Acute gout involving toe of right foot 01/22/2022   History of ductal carcinoma in situ (DCIS) of breast 10/15/2021   S/P hip replacement 10/11/2020   Hyperuricemia 08/25/2019   Anemia in chronic kidney disease 05/25/2019   Benign hypertensive kidney disease with chronic  kidney disease 05/25/2019   Vitamin B12 deficiency 05/07/2017   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   Arthralgia 10/04/2016   Pre-diabetes 06/20/2016   Chronic kidney disease, stage III (moderate) ([place]) 11/15/2015   Vitamin D deficiency 10/29/2015   HLD (hyperlipidemia) 06/15/2015   Obesity (BMI 30.0-34.9) 06/15/2015   Chronic left shoulder pain 11/08/2014   Numbness and tingling in left hand 11/07/2014   Benign essential HTN 12/07/2006    Past Surgical History:  Procedure Laterality Date   ABDOMINAL HYSTERECTOMY     BREAST BIOPSY Right 01/15/2016    cylinder shape calcs UOQ, FIBROADENOMATOUS   BREAST BIOPSY Right 12/30/2016   stereo bx for calcifications: DCIS UOQ, ribbon shaped   BREAST BIOPSY Right 01/08/2017   LOQ x shaped, FIBROADENOMATOUS CHANGE WITH   BREAST BIOPSY Left 03/09/2019   calcs bx, x marker neg   BREAST LUMPECTOMY Right 01/30/2018   DCIS clear margins   BREAST LUMPECTOMY WITH NEEDLE LOCALIZATION Right 01/30/2017   Procedure: BREAST LUMPECTOMY WITH NEEDLE LOCALIZATION;  Surgeon: Vickie Epley, MD;  Location: [facility];  Service: General;  Laterality: Right;   COLONOSCOPY WITH PROPOFOL N/A 11/19/2016   Procedure: COLONOSCOPY WITH PROPOFOL;  Surgeon: Jonathon Bellows, MD;  Location: [place];  Service: Endoscopy;  Laterality: N/A;   COLONOSCOPY WITH PROPOFOL N/A 08/28/2021   Procedure: COLONOSCOPY WITH PROPOFOL;  Surgeon: Jonathon Bellows, MD;  [facility] END[facility];  Service: Gastroenterology;  Laterality: N/A;   ESOPHAGOGASTRODUODENOSCOPY N/A 08/28/2021   Procedure: ESOPHAGOGASTRODUODENOSCOPY (EGD);  Surgeon: Jonathon Bellows, MD;  Location: [facility] ENDOSCOPY;  Service: Gastroenterology;  Laterality: N/A;   ESOPHAGOGASTRODUODENOSCOPY (EGD) WITH PROPOFOL N/A 11/19/2016   Procedure: ESOPHAGOGASTRODUODENOSCOPY (EGD) WITH PROPOFOL;  Surgeon: Jonathon Bellows, MD;  Location: [place];  Service: Endoscopy;  Laterality: N/A;   GIVENS CAPSULE STUDY  N/A 12/12/2016   Procedure: GIVENS CAPSULE STUDY;  Surgeon: Jonathon Bellows, MD;  Location: [facility] ENDOSCOPY;  Service: Gastroenterology;  Laterality: N/A;   TOTAL HIP ARTHROPLASTY Right 10/11/2020   Procedure: TOTAL HIP ARTHROPLASTY ANTERIOR APPROACH;  Surgeon: Hessie Knows, MD;  Location: [facility];  Service: Orthopedics;  Laterality: Right;   TOTAL MASTECTOMY Right 01/30/2017   Error    Family History  Problem Relation Age of Onset   Cancer Mother 44       liver cancer   Diabetes Mother    Hypertension Mother    Glomerulonephritis Father    Diabetes Sister    Glomerulonephritis Sister    Cancer Sister 108       throat cancer   Heart disease Sister    Diabetes Sister    Diabetes Sister    COPD Sister    Diabetes Sister    Breast  cancer Cousin        Currently undergoing systemic chemotherapy (01/2017)   Varicose Veins Neg Hx     Social History   Tobacco Use   Smoking status: Never   Smokeless tobacco: Never  Vaping Use   Vaping Use: Never used  Substance Use Topics   Alcohol use: No    Alcohol/week: 0.0 standard drinks of alcohol   Drug use: No     Allergies  Allergen Reactions   Penicillins Anaphylaxis   Shellfish Allergy Swelling    angioedema    Health Maintenance  Topic Date Due   COVID-19 Vaccine (3 - Moderna risk series) 05/17/2022 (Originally 08/13/2019)   Zoster Vaccines- Shingrix (1 of 2) 07/31/2022 (Originally 10/31/1967)   Medicare Annual Wellness (AWV)  12/11/2022   MAMMOGRAM  03/20/2023   DEXA SCAN  06/12/2023   DTaP/Tdap/Td (2 - Td or Tdap) 08/03/2023   COLONOSCOPY (Pts 45-42yr Insurance coverage will need to be confirmed)  08/29/2031   Pneumonia Vaccine 71 Years old  Completed   INFLUENZA VACCINE  Completed   Hepatitis C Screening  Completed   HPV VACCINES  Aged Out    Chart Review Today: I personally reviewed active problem list, medication list, allergies, family history, social history, health maintenance, notes from last encounter, lab  results, imaging with the patient/caregiver today.   Review of Systems  Constitutional: Negative.   HENT: Negative.    Eyes: Negative.   Respiratory: Negative.    Cardiovascular: Negative.   Gastrointestinal: Negative.   Endocrine: Negative.   Genitourinary: Negative.   Musculoskeletal: Negative.   Skin: Negative.   Allergic/Immunologic: Negative.   Neurological: Negative.   Hematological: Negative.   Psychiatric/Behavioral: Negative.    All other systems reviewed and are negative.    Objective:   Vitals:   05/01/22 1432  BP: 126/72  Pulse: 91  Resp: 18  Temp: 97.6 F (36.4 C)  SpO2: 99%  Weight: 172 lb 4.8 oz (78.2 kg)  Height: _0  (1.549 m)    Body mass index is 32.56 kg/m.  Physical Exam Vitals and nursing note reviewed.  Constitutional:      General: She is not in acute distress.    Appearance: Normal appearance. She is well-developed, well-groomed and overweight. She is not ill-appearing, toxic-appearing or diaphoretic.  HENT:     Head: Normocephalic and atraumatic.     Right Ear: External ear normal.     Left Ear: External ear normal.     Nose: Nose normal.  Eyes:     General:        Right eye: No discharge.        Left eye: No discharge.     Conjunctiva/sclera: Conjunctivae normal.  Neck:     Trachea: No tracheal deviation.  Cardiovascular:     Rate and Rhythm: Normal rate and regular rhythm.     Pulses: Normal pulses.     Heart sounds: Normal heart sounds. No murmur heard.    No friction rub. No gallop.  Pulmonary:     Effort: Pulmonary effort is normal. No respiratory distress.     Breath sounds: Normal breath sounds. No stridor. No wheezing, rhonchi or rales.  Abdominal:     General: Bowel sounds are normal. There is no distension.     Palpations: Abdomen is soft.  Musculoskeletal:     Right lower leg: Edema present.     Left lower leg: Edema present.  Skin:    General: Skin is warm and dry.  Findings: No rash.  Neurological:      Mental Status: She is alert. Mental status is at baseline.     Motor: No abnormal muscle tone.     Coordination: Coordination normal.  Psychiatric:        Mood and Affect: Mood normal.        Behavior: Behavior normal. Behavior is cooperative.         Assessment & Plan:   Problem List Items Addressed This Visit       Cardiovascular and Mediastinum   Benign essential HTN - Primary    Blood pressure at goal today without reported hypotension Plan for the patient to ask her nephrologist if she is able to restart losartan for renal protection she is currently only on metoprolol      Relevant Medications   metoprolol succinate (TOPROL-XL) 25 MG 24 hr tablet     Musculoskeletal and Integument   Acute gout involving toe of right foot    No recurrent gout episodes since the last office visit in September      Relevant Orders   Uric acid     Genitourinary   Chronic kidney disease, stage III (moderate) ([place])    Reviewed most recent labs and office visit with nephrology      Relevant Orders   VITAMIN D 25 Hydroxy (Vit-D Deficiency, Fractures)   Anemia in chronic kidney disease    Anemia did just slightly worsen with most recent hemoglobin down to 10.8 she is not currently on EPO        Other   HLD (hyperlipidemia)    Today she reports fatigue and body aches with Lipitor which she has not previously reported She discussed this with the nephrologist and he suggested she cut it in half she has cut the dose down from 20-10 but does continue to have some bothersome side effects We will recheck her labs and have her hold all statins for a few weeks We reviewed other possible treatment options      Relevant Medications   metoprolol succinate (TOPROL-XL) 25 MG 24 hr tablet   Other Relevant Orders   Lipid panel   Class 1 obesity with serious comorbidity and body mass index (BMI) of 32.0 to 32.9 in adult   Vitamin D deficiency    Last labs were several years ago and vitamin D  was low at that time she has had worse renal function since then, recheck labs today      Relevant Orders   VITAMIN D 25 Hydroxy (Vit-D Deficiency, Fractures)   Pre-diabetes    Lab Results  Component Value Date   HGBA1C 6.2 (H) 04/09/2021  She has been eating less and losing a little bit of weight recheck A1c today       Relevant Orders   Hemoglobin A1c   Hyperuricemia    She started allopurinol 100 mg daily a few months ago recheck uric acid, last was 9.5      Relevant Orders   Uric acid   Other Visit Diagnoses     Bilateral lower extremity edema       on multiple meds for this, triggered by soda, using compression socks, discussed vascular referral, she declines at this time   Epigastric abdominal pain       denies pain currently but some decreased appetite some days, reviewed recent GI consult, EDG and colonoscopy   Encounter for medication monitoring       Relevant Orders   Hemoglobin A1c  Lipid panel   Uric acid        Return in about 6 months (around 10/31/2022).   Delsa Grana, PA-C 05/01/22 2:46 PM

## 2022-05-02 ENCOUNTER — Other Ambulatory Visit: Payer: Self-pay | Admitting: Family Medicine

## 2022-05-02 DIAGNOSIS — E559 Vitamin D deficiency, unspecified: Secondary | ICD-10-CM

## 2022-05-02 DIAGNOSIS — E790 Hyperuricemia without signs of inflammatory arthritis and tophaceous disease: Secondary | ICD-10-CM

## 2022-05-02 DIAGNOSIS — R7303 Prediabetes: Secondary | ICD-10-CM

## 2022-05-02 DIAGNOSIS — E785 Hyperlipidemia, unspecified: Secondary | ICD-10-CM

## 2022-05-02 DIAGNOSIS — M109 Gout, unspecified: Secondary | ICD-10-CM

## 2022-05-02 LAB — LIPID PANEL
Cholesterol: 204 mg/dL — ABNORMAL HIGH (ref <200)
HDL: 55 mg/dL (ref >=50)
LDL Cholesterol (Calc): 117 mg/dL (calc) — ABNORMAL HIGH
Non-HDL Cholesterol (Calc): 149 mg/dL (calc) — ABNORMAL HIGH (ref <130)
Total CHOL/HDL Ratio: 3.7 (calc) (ref <5.0)
Triglycerides: 207 mg/dL — ABNORMAL HIGH (ref <150)

## 2022-05-02 LAB — HEMOGLOBIN A1C
Hgb A1c MFr Bld: 6.2 % of total Hgb — ABNORMAL HIGH (ref <5.7)
Mean Plasma Glucose: 131 mg/dL
eAG (mmol/L): 7.3 mmol/L

## 2022-05-02 LAB — VITAMIN D 25 HYDROXY (VIT D DEFICIENCY, FRACTURES): Vit D, 25-Hydroxy: 39 ng/mL (ref 30–100)

## 2022-05-02 LAB — URIC ACID: Uric Acid, Serum: 7.8 mg/dL — ABNORMAL HIGH (ref 2.5–7.0)

## 2022-05-02 MED ORDER — ALLOPURINOL 200 MG PO TABS: 200.0000 mg | ORAL_TABLET | Freq: Every day | ORAL | 11 refills | Status: DC

## 2022-05-02 MED ORDER — ROSUVASTATIN CALCIUM 5 MG PO TABS: 5.0000 mg | ORAL_TABLET | Freq: Every day | ORAL | 1 refills | Status: DC

## 2022-05-02 NOTE — Progress Notes (Signed)
ICD-10-CM   1. Hyperlipidemia, unspecified hyperlipidemia type  E78.5 rosuvastatin (CRESTOR) 5 MG tablet   change in statin due to SE, stop lipitor, 2-3 week statin break, start rosuvastatin 5 mg can start every other day or 1/2 dose, f/up is recurrent SE    2. Hyperuricemia  E79.0 allopurinol 200 MG TABS   uric acid high, increase allopurinol from 100 to 200    3. Acute gout involving toe of right foot, unspecified cause  M10.9 allopurinol 200 MG TABS   see above    4. Vitamin D deficiency  E55.9    normal, continue supplement    5. Pre-diabetes  R73.03    unchanged A1C, continue diet/lifestyle efforts

## 2022-05-02 NOTE — Progress Notes (Signed)
Please call pt with lab results and plan/med changes  The A1C was the same - still 6.2 and in prediabetic range - continue diet/lifestyle efforts  ric acid did decrease a little bit, but it still is high and can cause gout flares (goal to have <6.0) its 7.8 - increase allopurinol to 200 mg once daily - I will fix the prescription  Cholesterol was overall better than a year ago - I think we should stick with the same plan that we discussed during her visit:  take a 2-3 week break from statin and see if all the side effects resolve.  I will send in a different lower dose statin and please let me know if you have all the side effects again after restarting it - she can also start the new one with 1/2 tab dose or ever other day dosing.  Vit D level is now normal and good - keep doing what she is doing

## 2022-05-20 DIAGNOSIS — M25462 Effusion, left knee: Secondary | ICD-10-CM | POA: Diagnosis not present

## 2022-05-20 DIAGNOSIS — M25562 Pain in left knee: Secondary | ICD-10-CM | POA: Diagnosis not present

## 2022-05-20 DIAGNOSIS — M1712 Unilateral primary osteoarthritis, left knee: Secondary | ICD-10-CM | POA: Diagnosis not present

## 2022-05-20 DIAGNOSIS — M76892 Other specified enthesopathies of left lower limb, excluding foot: Secondary | ICD-10-CM | POA: Diagnosis not present

## 2022-05-20 DIAGNOSIS — M2342 Loose body in knee, left knee: Secondary | ICD-10-CM | POA: Diagnosis not present

## 2022-05-20 DIAGNOSIS — G8929 Other chronic pain: Secondary | ICD-10-CM | POA: Diagnosis not present

## 2022-06-04 DIAGNOSIS — M1712 Unilateral primary osteoarthritis, left knee: Secondary | ICD-10-CM | POA: Diagnosis not present

## 2022-06-04 DIAGNOSIS — M25462 Effusion, left knee: Secondary | ICD-10-CM | POA: Diagnosis not present

## 2022-06-04 DIAGNOSIS — G8929 Other chronic pain: Secondary | ICD-10-CM | POA: Diagnosis not present

## 2022-08-05 DIAGNOSIS — M25532 Pain in left wrist: Secondary | ICD-10-CM | POA: Diagnosis not present

## 2022-08-05 DIAGNOSIS — M654 Radial styloid tenosynovitis [de Quervain]: Secondary | ICD-10-CM | POA: Diagnosis not present

## 2022-08-06 ENCOUNTER — Other Ambulatory Visit: Payer: Self-pay | Admitting: Family Medicine

## 2022-08-06 DIAGNOSIS — E790 Hyperuricemia without signs of inflammatory arthritis and tophaceous disease: Secondary | ICD-10-CM

## 2022-08-06 DIAGNOSIS — I10 Essential (primary) hypertension: Secondary | ICD-10-CM

## 2022-08-06 DIAGNOSIS — M109 Gout, unspecified: Secondary | ICD-10-CM

## 2022-08-06 NOTE — Telephone Encounter (Signed)
Requested Prescriptions  Pending Prescriptions Disp Refills   Allopurinol 200 MG TABS 30 tablet 11    Sig: Take 200 mg by mouth daily.     Endocrinology:  Gout Agents - allopurinol Failed - 08/06/2022 12:19 PM      Failed - Uric Acid in normal range and within 360 days    Uric Acid, Serum  Date Value Ref Range Status  05/01/2022 7.8 (H) 2.5 - 7.0 mg/dL Final    Comment:    Therapeutic target for gout patients: <6.0 mg/dL .          Failed - Cr in normal range and within 360 days    Creat  Date Value Ref Range Status  08/15/2021 1.45 (H) 0.60 - 1.00 mg/dL Final   Creatinine, Ser  Date Value Ref Range Status  01/19/2022 1.47 (H) 0.44 - 1.00 mg/dL Final         Passed - Valid encounter within last 12 months    Recent Outpatient Visits           3 months ago Benign essential HTN   [facility] Delsa Grana, PA-C   6 months ago Hyperuricemia   [facility] Delsa Grana, PA-C   9 months ago Benign essential HTN   [facility] Teodora Medici, DO   11 months ago BRBPR (bright red blood per rectum)   [facility] Delsa Grana, PA-C   1 year ago [place], NP       Future Appointments             In 2 months Delsa Grana, PA-C [facility], [place]   In 4 months Delsa Grana, PA-C [facility], [place] within normal limits and completed in the last 12 months    WBC  Date Value Ref Range Status  01/19/2022 7.7 4.0 - 10.5 K/uL Final   RBC  Date Value Ref Range Status  01/19/2022 3.79 (L) 3.87 - 5.11 MIL/uL Final   Hemoglobin  Date Value Ref Range Status  01/19/2022 11.7 (L) 12.0 - 15.0 g/dL Final   HCT  Date Value Ref Range Status  01/19/2022 35.8 (L) 36.0 - 46.0 % Final   MCHC  Date Value Ref Range Status  01/19/2022 32.7 30.0  - 36.0 g/dL Final   [facility]  Date Value Ref Range Status  01/19/2022 30.9 26.0 - 34.0 pg Final   MCV  Date Value Ref Range Status  01/19/2022 94.5 80.0 - 100.0 fL Final   No results found for: "PLTCOUNTKUC", "LABPLAT", "POCPLA" RDW  Date Value Ref Range Status  01/19/2022 13.7 11.5 - 15.5 % Final          triamterene-hydrochlorothiazide (MAXZIDE-25) 37.5-25 MG tablet 90 tablet 3    Sig: Take 1 tablet by mouth daily.     Cardiovascular: Diuretic Combos Failed - 08/06/2022 12:19 PM      Failed - K in normal range and within 180 days    Potassium  Date Value Ref Range Status  01/19/2022 3.8 3.5 - 5.1 mmol/L Final         Failed - Na in normal range and within 180 days    Sodium  Date Value Ref Range Status  01/19/2022 139 135 - 145 mmol/L Final  02/21/2015 140 136 -  144 mmol/L Final    Comment:                  **Please note reference interval change**         Failed - Cr in normal range and within 180 days    Creat  Date Value Ref Range Status  08/15/2021 1.45 (H) 0.60 - 1.00 mg/dL Final   Creatinine, Ser  Date Value Ref Range Status  01/19/2022 1.47 (H) 0.44 - 1.00 mg/dL Final         Passed - Last BP in normal range    BP Readings from Last 1 Encounters:  05/01/22 126/72         Passed - Valid encounter within last 6 months    Recent Outpatient Visits           3 months ago Benign essential HTN   [facility] Delsa Grana, PA-C   6 months ago Hyperuricemia   [facility] Delsa Grana, PA-C   9 months ago Benign essential HTN   [facility] Teodora Medici, DO   11 months ago BRBPR (bright red blood per rectum)   [facility] Delsa Grana, PA-C   1 year ago [facility] Kathrine Haddock, NP       Future Appointments             In 2 months Delsa Grana, [facility], [place]   In  4 months Delsa Grana, [facility]

## 2022-08-06 NOTE — Telephone Encounter (Signed)
Medication Refill - Medication: allopurinol 200 MG TABS  triamterene-hydrochlorothiazide (MAXZIDE-25) 37.5-25 MG tablet  Pt as two days left of medication.  Has the patient contacted their pharmacy? Yes.   Pt states that she switched her pharmacy.   Preferred Pharmacy (with phone number or street name):  [id number] - [place], [place] - [address] Phone: [phone]  Fax: [phone]     Has the patient been seen for an appointment in the last year OR does the patient have an upcoming appointment? Yes.    Agent: Please be advised that RX refills may take up to 3 business days. We ask that you follow-up with your pharmacy.

## 2022-08-14 DIAGNOSIS — N1832 Chronic kidney disease, stage 3b: Secondary | ICD-10-CM | POA: Diagnosis not present

## 2022-08-14 DIAGNOSIS — I129 Hypertensive chronic kidney disease with stage 1 through stage 4 chronic kidney disease, or unspecified chronic kidney disease: Secondary | ICD-10-CM | POA: Diagnosis not present

## 2022-08-14 DIAGNOSIS — D631 Anemia in chronic kidney disease: Secondary | ICD-10-CM | POA: Diagnosis not present

## 2022-08-14 DIAGNOSIS — R809 Proteinuria, unspecified: Secondary | ICD-10-CM | POA: Diagnosis not present

## 2022-08-18 ENCOUNTER — Telehealth: Payer: Self-pay | Admitting: Family Medicine

## 2022-08-18 NOTE — Telephone Encounter (Signed)
Called pt back and asked what was going on. Pt states she never picked up the rx 04/22/22 of the 200mg  of allopurinol. She was doing 100mg  but BID. I advised to call [facility] to fill her new rx that was sent in back in Dec, 2023. PT VERBALIZED UNDERSTANDING

## 2022-08-18 NOTE — Telephone Encounter (Signed)
Medication Refill - Medication: Allopurinol 200 MG TABS  Has the patient contacted their pharmacy? Yes.   Patient stated she was told by the pharmacy a month ago to take 2 pills so that is why she is out early. Please contact patient for further information. She has been out of meds since last week  Preferred Pharmacy (with phone number or street name):  [facility] [address] [place], [place] - [address]  Phone: [phone] Fax: [phone]  Has the patient been seen for an appointment in the last year OR does the patient have an upcoming appointment? No.  Agent: Please be advised that RX refills may take up to 3 business days. We ask that you follow-up with your pharmacy.

## 2022-08-21 ENCOUNTER — Other Ambulatory Visit: Payer: Self-pay

## 2022-08-21 DIAGNOSIS — E790 Hyperuricemia without signs of inflammatory arthritis and tophaceous disease: Secondary | ICD-10-CM

## 2022-08-21 DIAGNOSIS — M109 Gout, unspecified: Secondary | ICD-10-CM

## 2022-09-03 ENCOUNTER — Inpatient Hospital Stay: Payer: BC Managed Care – PPO | Admitting: Oncology

## 2022-09-03 ENCOUNTER — Ambulatory Visit: Payer: BC Managed Care – PPO | Admitting: Oncology

## 2022-09-09 ENCOUNTER — Inpatient Hospital Stay: Payer: BC Managed Care – PPO | Attending: Oncology | Admitting: Oncology

## 2022-09-09 ENCOUNTER — Encounter: Payer: Self-pay | Admitting: Oncology

## 2022-09-09 VITALS — BP 118/88 | HR 87 | Temp 96.4°F | Resp 18 | Ht 61.0 in | Wt 174.0 lb

## 2022-09-09 DIAGNOSIS — Z86000 Personal history of in-situ neoplasm of breast: Secondary | ICD-10-CM | POA: Diagnosis not present

## 2022-09-09 DIAGNOSIS — Z08 Encounter for follow-up examination after completed treatment for malignant neoplasm: Secondary | ICD-10-CM

## 2022-09-09 NOTE — Progress Notes (Signed)
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 ER greater than 90% positive and PR was 11-50% positive. Biopsy of the right lower quadrant of the right breast showed fibroadenomatous changes with calcifications.Patient underwent lumpectomy on 01/30/2017 which showed a 15 mm DCIS, grade 2 with comedonecrosis. Margins were -3 mm from the closest medial margin lymph nodes were not submitted. Pathology stage pTisNx. ER greater than 90% positive PR 11-50% positive. Patient completed adjuvant RT and started arimidex in jan 2019.  She completed 5 years of endocrine therapy in January 2024  Interval history-patient is doing well and denies any specific complaints at this time.  Occasional pain at the lumpectomy site.  ECOG PS- 0 Pain scale- 0   Review of systems- Review of Systems  Constitutional:  Negative for chills, fever, malaise/fatigue and weight loss.  HENT:  Negative for congestion, ear discharge and nosebleeds.   Eyes:  Negative for blurred vision.  Respiratory:  Negative for cough, hemoptysis, sputum production, shortness of breath and wheezing.   Cardiovascular:  Negative for chest pain, palpitations, orthopnea and claudication.  Gastrointestinal:  Negative for abdominal pain, blood in stool, constipation, diarrhea, heartburn, melena, nausea and vomiting.   Genitourinary:  Negative for dysuria, flank pain, frequency, hematuria and urgency.  Musculoskeletal:  Negative for back pain, joint pain and myalgias.  Skin:  Negative for rash.  Neurological:  Negative for dizziness, tingling, focal weakness, seizures, weakness and headaches.  Endo/Heme/Allergies:  Does not bruise/bleed easily.  Psychiatric/Behavioral:  Negative for depression and suicidal ideas. The patient does not have insomnia.       Allergies  Allergen Reactions   Penicillins Anaphylaxis   Shellfish Allergy Swelling    angioedema     Past Medical History:  Diagnosis Date   Abnormal mammogram of right breast 01/17/2016   Recommendation: Six month follow-up diagnostic mammogram of the right breast for an additional group of probably benign Calcifications. 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   RIGHT, August 2018   Family history of diabetes mellitus 06/12/2016   Mother and several other relatives   HLD (hyperlipidemia) 06/15/2015   Hx of right breast biopsy 01/17/2016   Hyperlipidemia    Hypertension    Medication monitoring encounter 11/15/2015   Motion sickness    ocean ships   Numbness and tingling in left hand 11/07/2014   Obesity 10/25/2015   Obesity (BMI 30.0-34.9) 10/25/2015   Personal history of radiation therapy 2018   right breast ca   PONV (postoperative nausea and vomiting)    patient stated she vomits a lot after anesthsia   Positive ANA (  antinuclear antibody) 10/04/2016   Prediabetes    Preventative health care 11/15/2015   Vitamin D deficiency 10/29/2015     Past Surgical History:  Procedure Laterality Date   ABDOMINAL HYSTERECTOMY     BREAST BIOPSY Right  01/15/2016    cylinder shape calcs UOQ, FIBROADENOMATOUS   BREAST BIOPSY Right 12/30/2016   stereo bx for calcifications: DCIS UOQ, ribbon shaped   BREAST BIOPSY Right 01/08/2017   LOQ x shaped, FIBROADENOMATOUS CHANGE WITH   BREAST BIOPSY Left 03/09/2019   calcs bx, x marker neg   BREAST LUMPECTOMY Right 01/30/2018   DCIS clear margins   BREAST LUMPECTOMY WITH NEEDLE LOCALIZATION Right 01/30/2017   Procedure: BREAST LUMPECTOMY WITH NEEDLE LOCALIZATION;  Surgeon: Ancil Linsey, MD;  Location: [facility] ORS;  Service: General;  Laterality: Right;   COLONOSCOPY WITH PROPOFOL N/A 11/19/2016   Procedure: COLONOSCOPY WITH PROPOFOL;  Surgeon: Wyline Mood, MD;  Location: [facility];  Service: Endoscopy;  Laterality: N/A;   COLONOSCOPY WITH PROPOFOL N/A 08/28/2021   Procedure: COLONOSCOPY WITH PROPOFOL;  Surgeon: Wyline Mood, MD;  Location: [facility] ENDOSCOPY;  Service: Gastroenterology;  Laterality: N/A;   ESOPHAGOGASTRODUODENOSCOPY N/A 08/28/2021   Procedure: ESOPHAGOGASTRODUODENOSCOPY (EGD);  Surgeon: Wyline Mood, MD;  Location: [facility] ENDOSCOPY;  Service: Gastroenterology;  Laterality: N/A;   ESOPHAGOGASTRODUODENOSCOPY (EGD) WITH PROPOFOL N/A 11/19/2016   Procedure: ESOPHAGOGASTRODUODENOSCOPY (EGD) WITH PROPOFOL;  Surgeon: Wyline Mood, MD;  Location: [facility];  Service: Endoscopy;  Laterality: N/A;   GIVENS CAPSULE STUDY N/A 12/12/2016   Procedure: GIVENS CAPSULE STUDY;  Surgeon: Wyline Mood, MD;  Location: [facility] ENDOSCOPY;  Service: Gastroenterology;  Laterality: N/A;   TOTAL HIP ARTHROPLASTY Right 10/11/2020   Procedure: TOTAL HIP ARTHROPLASTY ANTERIOR APPROACH;  Surgeon: Kennedy Bucker, MD;  Location: [facility] ORS;  Service: Orthopedics;  Laterality: Right;   TOTAL MASTECTOMY Right 01/30/2017   Error    Social History   Socioeconomic History   Marital status: Married    Spouse name: Maryann Ferrigno   Number of children: 2   Years of education: Not on file   Highest education  level: Not on file  Occupational History   Occupation: Works in [place] [employer] [place], [place])    Employer: [employer]  Tobacco Use   Smoking status: Never   Smokeless tobacco: Never  Vaping Use   Vaping Use: Never used  Substance and Sexual Activity   Alcohol use: No    Alcohol/week: 0.0 standard drinks of alcohol   Drug use: No   Sexual activity: Not Currently  Other Topics Concern   Not on file  Social History Narrative   Not on file   Social Determinants of Health   Financial Resource Strain: Low Risk  (12/10/2021)   Overall Financial Resource Strain (CARDIA)    Difficulty of Paying Living Expenses: Not hard at all  Food Insecurity: No Food Insecurity (12/10/2021)   Hunger Vital Sign    Worried About Running Out of Food in the Last Year: Never true    Ran Out of Food in the Last Year: Never true  Transportation Needs: No Transportation Needs (12/10/2021)   PRAPARE - Administrator, Civil Service (Medical): No    Lack of Transportation (Non-Medical): No  Physical Activity: Insufficiently Active (12/06/2020)   Exercise Vital Sign    Days of Exercise per Week: 7 days    Minutes of Exercise per Session: 20 min  Stress: No Stress Concern Present (12/10/2021)   [employer] of Occupational Health -  Occupational Stress Questionnaire    Feeling of Stress : Not at all  Social Connections: Socially Integrated (12/10/2021)   Social Connection and Isolation Panel [NHANES]    Frequency of Communication with Friends and Family: More than three times a week    Frequency of Social Gatherings with Friends and Family: Three times a week    Attends Religious Services: More than 4 times per year    Active Member of Clubs or Organizations: Yes    Attends Banker Meetings: More than 4 times per year    Marital Status: Married  Catering manager Violence: Not At Risk (12/10/2021)   Humiliation, Afraid, Rape, and Kick questionnaire    Fear of Current or  Ex-Partner: No    Emotionally Abused: No    Physically Abused: No    Sexually Abused: No    Family History  Problem Relation Age of Onset   Cancer Mother 19       liver cancer   Diabetes Mother    Hypertension Mother    Glomerulonephritis Father    Diabetes Sister    Glomerulonephritis Sister    Cancer Sister 47       throat cancer   Heart disease Sister    Diabetes Sister    Diabetes Sister    COPD Sister    Diabetes Sister    Breast cancer Cousin        Currently undergoing systemic chemotherapy (01/2017)   Varicose Veins Neg Hx      Current Outpatient Medications:    acetaminophen (TYLENOL) 325 MG tablet, Take 650 mg by mouth every 6 (six) hours as needed for mild pain or headache. , Disp: , Rfl:    allopurinol (ZYLOPRIM) 100 MG tablet, Take 100 mg by mouth daily., Disp: , Rfl:    allopurinol 200 MG TABS, Take 200 mg by mouth daily., Disp: 30 tablet, Rfl: 11   anastrozole (ARIMIDEX) 1 MG tablet, TAKE 1 TABLET BY MOUTH EVERY DAY, Disp: 90 tablet, Rfl: 1   Ascorbic Acid (VITAMIN C) 1000 MG tablet, Take 1,000 mg by mouth in the morning., Disp: , Rfl:    calcium carbonate (OS-CAL) 600 MG TABS tablet, Take 600 mg by mouth daily with breakfast., Disp: , Rfl:    Cholecalciferol (VITAMIN D3) 50 MCG (2000 UT) TABS, Take 2,000 Units by mouth daily., Disp: , Rfl:    Cholecalciferol 50 MCG (2000 UT) TABS, Take by mouth., Disp: , Rfl:    Cyanocobalamin (VITAMIN B-12) 500 MCG SUBL, Place 500 mcg under the tongue daily., Disp: , Rfl:    latanoprost (XALATAN) 0.005 % ophthalmic solution, Place 1 drop into both eyes at bedtime., Disp: , Rfl:    losartan (COZAAR) 25 MG tablet, Take 25 mg by mouth daily., Disp: , Rfl:    metoprolol succinate (TOPROL-XL) 25 MG 24 hr tablet, Take 1 tablet (25 mg total) by mouth daily., Disp: 90 tablet, Rfl: 3   omeprazole (PRILOSEC) 40 MG capsule, Take 1 capsule (40 mg total) by mouth daily., Disp: 90 capsule, Rfl: 0   predniSONE (DELTASONE) 20 MG tablet, Take  20 mg by mouth 2 (two) times daily., Disp: , Rfl:    rosuvastatin (CRESTOR) 5 MG tablet, Take 1 tablet (5 mg total) by mouth daily., Disp: 90 tablet, Rfl: 1   torsemide (DEMADEX) 10 MG tablet, Take 1 tablet (10 mg total) by mouth daily as needed (fluid)., Disp: 90 tablet, Rfl: 0   triamterene-hydrochlorothiazide (MAXZIDE-25) 37.5-25 MG tablet, Take 1 tablet by  mouth daily., Disp: 90 tablet, Rfl: 3  Physical exam:  Vitals:   09/09/22 1314  BP: 118/88  Pulse: 87  Resp: 18  Temp: (!) 96.4 F (35.8 C)  TempSrc: Tympanic  SpO2: 100%  Weight: 174 lb (78.9 kg)  Height: 5\' 1"  (1.549 m)   Physical Exam Cardiovascular:     Rate and Rhythm: Normal rate and regular rhythm.     Heart sounds: Normal heart sounds.  Pulmonary:     Effort: Pulmonary effort is normal.     Breath sounds: Normal breath sounds.  Skin:    General: Skin is warm and dry.  Neurological:     Mental Status: She is alert and oriented to person, place, and time.    No palpable cervical, supraclavicular, axillary or inguinal adenopathy      Latest Ref Rng & Units 01/19/2022    5:58 PM  CMP  Glucose 70 - 99 mg/dL 811   BUN 8 - 23 mg/dL 25   Creatinine 9.14 - 1.00 mg/dL 7.82   Sodium 956 - 213 mmol/L 139   Potassium 3.5 - 5.1 mmol/L 3.8   Chloride 98 - 111 mmol/L 106   CO2 22 - 32 mmol/L 25   Calcium 8.9 - 10.3 mg/dL 9.8       Latest Ref Rng & Units 01/19/2022    5:58 PM  CBC  WBC 4.0 - 10.5 K/uL 7.7   Hemoglobin 12.0 - 15.0 g/dL 08.6   Hematocrit 57.8 - 46.0 % 35.8   Platelets 150 - 400 K/uL 244      Assessment and plan- Patient is a 72 y.o. female with history of ER positive right DCIS status postlumpectomy adjuvant radiation therapy and 5 years of endocrine therapy.  She is here for routine follow-up  Patient has completed 5 years of Arimidex in January 2024.  She is doing well from a DCIS standpoint.  I will schedule her surveillance mammogram in November 2024.  She wishes to see [place] on a yearly basis for  routine breast exams.  I will see her back in 1 year no labs   Visit Diagnosis 1. Encounter for follow-up surveillance of ductal carcinoma in situ (DCIS) of breast      Dr. Owens Shark, MD, MPH [facility] [phone] 09/09/2022 4:43 PM

## 2022-10-07 ENCOUNTER — Other Ambulatory Visit: Payer: Self-pay | Admitting: Family Medicine

## 2022-10-20 DIAGNOSIS — Z6835 Body mass index (BMI) 35.0-35.9, adult: Secondary | ICD-10-CM | POA: Diagnosis not present

## 2022-10-20 DIAGNOSIS — M160 Bilateral primary osteoarthritis of hip: Secondary | ICD-10-CM | POA: Diagnosis not present

## 2022-10-20 DIAGNOSIS — Z96641 Presence of right artificial hip joint: Secondary | ICD-10-CM | POA: Diagnosis not present

## 2022-10-20 DIAGNOSIS — M7061 Trochanteric bursitis, right hip: Secondary | ICD-10-CM | POA: Diagnosis not present

## 2022-10-30 ENCOUNTER — Ambulatory Visit: Payer: BC Managed Care – PPO | Admitting: Family Medicine

## 2022-10-31 ENCOUNTER — Ambulatory Visit: Payer: BC Managed Care – PPO | Admitting: Family Medicine

## 2022-10-31 ENCOUNTER — Other Ambulatory Visit: Payer: Self-pay | Admitting: Family Medicine

## 2022-10-31 DIAGNOSIS — I10 Essential (primary) hypertension: Secondary | ICD-10-CM

## 2022-10-31 NOTE — Telephone Encounter (Signed)
Medication Refill - Medication: triamterene-hydrochlorothiazide (MAXZIDE-25) 37.5-25 MG tablet  Pt is out of medication and has an appt scheduled for 11/11/22, pt is wanting to see if she can get a refill to last until her next appt.   Has the patient contacted their pharmacy? No.   Preferred Pharmacy (with phone number or street name): [id number] [place], [place] - [address]  Phone: [phone] Fax: [phone]  Has the patient been seen for an appointment in the last year OR does the patient have an upcoming appointment? Yes.    Agent: Please be advised that RX refills may take up to 3 business days. We ask that you follow-up with your pharmacy. [name] Kitchen

## 2022-11-03 MED ORDER — TRIAMTERENE-HCTZ 37.5-25 MG PO TABS: 1.0000 | ORAL_TABLET | Freq: Every day | ORAL | 0 refills | Status: DC

## 2022-11-03 NOTE — Telephone Encounter (Signed)
Courtesy 30 day given- last Rx 04/09/21 #[id number] Requested Prescriptions  Pending Prescriptions Disp Refills   triamterene-hydrochlorothiazide (MAXZIDE-25) 37.5-25 MG tablet 90 tablet 3    Sig: Take 1 tablet by mouth daily.     Cardiovascular: Diuretic Combos Failed - 10/31/2022 12:20 PM      Failed - K in normal range and within 180 days    Potassium  Date Value Ref Range Status  01/19/2022 3.8 3.5 - 5.1 mmol/L Final         Failed - Na in normal range and within 180 days    Sodium  Date Value Ref Range Status  01/19/2022 139 135 - 145 mmol/L Final  02/21/2015 140 136 - 144 mmol/L Final    Comment:                  **Please note reference interval change**         Failed - Cr in normal range and within 180 days    Creat  Date Value Ref Range Status  08/15/2021 1.45 (H) 0.60 - 1.00 mg/dL Final   Creatinine, Ser  Date Value Ref Range Status  01/19/2022 1.47 (H) 0.44 - 1.00 mg/dL Final         Failed - Valid encounter within last 6 months    Recent Outpatient Visits           6 months ago Benign essential HTN   [facility] Danelle Berry, PA-C   9 months ago Hyperuricemia   [facility] Danelle Berry, PA-C   1 year ago Benign essential HTN   [facility] Margarita Mail, DO   1 year ago BRBPR (bright red blood per rectum)   [facility] Danelle Berry, PA-C   1 year ago Pre-diabetes   [facility] Gabriel Cirri, NP       Future Appointments             In 1 week Danelle Berry, PA-C [facility]   In 1 month Danelle Berry, PA-C [facility]            Passed - Last BP in normal range    BP Readings from Last 1 Encounters:  09/09/22 118/88

## 2022-11-11 ENCOUNTER — Encounter: Payer: Self-pay | Admitting: Family Medicine

## 2022-11-11 ENCOUNTER — Ambulatory Visit (INDEPENDENT_AMBULATORY_CARE_PROVIDER_SITE_OTHER): Payer: BC Managed Care – PPO | Admitting: Family Medicine

## 2022-11-11 VITALS — BP 114/76 | HR 91 | Temp 97.4°F | Resp 16 | Ht 61.0 in | Wt 175.9 lb

## 2022-11-11 DIAGNOSIS — Z5181 Encounter for therapeutic drug level monitoring: Secondary | ICD-10-CM

## 2022-11-11 DIAGNOSIS — G8929 Other chronic pain: Secondary | ICD-10-CM

## 2022-11-11 DIAGNOSIS — E785 Hyperlipidemia, unspecified: Secondary | ICD-10-CM | POA: Diagnosis not present

## 2022-11-11 DIAGNOSIS — R1013 Epigastric pain: Secondary | ICD-10-CM | POA: Diagnosis not present

## 2022-11-11 DIAGNOSIS — R600 Localized edema: Secondary | ICD-10-CM | POA: Diagnosis not present

## 2022-11-11 DIAGNOSIS — M109 Gout, unspecified: Secondary | ICD-10-CM

## 2022-11-11 DIAGNOSIS — E790 Hyperuricemia without signs of inflammatory arthritis and tophaceous disease: Secondary | ICD-10-CM | POA: Diagnosis not present

## 2022-11-11 DIAGNOSIS — I10 Essential (primary) hypertension: Secondary | ICD-10-CM | POA: Diagnosis not present

## 2022-11-11 DIAGNOSIS — E669 Obesity, unspecified: Secondary | ICD-10-CM | POA: Diagnosis not present

## 2022-11-11 DIAGNOSIS — M791 Myalgia, unspecified site: Secondary | ICD-10-CM

## 2022-11-11 DIAGNOSIS — R7303 Prediabetes: Secondary | ICD-10-CM | POA: Diagnosis not present

## 2022-11-11 DIAGNOSIS — N1832 Chronic kidney disease, stage 3b: Secondary | ICD-10-CM | POA: Diagnosis not present

## 2022-11-11 DIAGNOSIS — M79645 Pain in left finger(s): Secondary | ICD-10-CM

## 2022-11-11 DIAGNOSIS — M79642 Pain in left hand: Secondary | ICD-10-CM

## 2022-11-11 LAB — CBC WITH DIFFERENTIAL/PLATELET
Absolute Monocytes: 572 cells/uL (ref 200–950)
Basophils Absolute: 50 cells/uL (ref 0–200)
Basophils Relative: 0.9 %
Eosinophils Relative: 1.6 %
Lymphs Abs: 1766 cells/uL (ref 850–3900)
MCHC: 33.4 g/dL (ref 32.0–36.0)
MCV: 95.7 fL (ref 80.0–100.0)
MPV: 11.2 fL (ref 7.5–12.5)
Monocytes Relative: 10.4 %
Neutrophils Relative %: 55 %
Platelets: 235 10*3/uL (ref 140–400)
RBC: 3.97 10*6/uL (ref 3.80–5.10)
RDW: 13 % (ref 11.0–15.0)
Total Lymphocyte: 32.1 %

## 2022-11-11 MED ORDER — ROSUVASTATIN CALCIUM 5 MG PO TABS
5.0000 mg | ORAL_TABLET | Freq: Every day | ORAL | 1 refills | Status: DC
Start: 2022-11-11 — End: 2023-05-18

## 2022-11-11 MED ORDER — LOSARTAN POTASSIUM 25 MG PO TABS: 25.0000 mg | ORAL_TABLET | Freq: Every day | ORAL | 1 refills | Status: DC

## 2022-11-11 MED ORDER — TRIAMTERENE-HCTZ 37.5-25 MG PO TABS: 1.0000 | ORAL_TABLET | Freq: Every day | ORAL | 1 refills | Status: DC

## 2022-11-11 NOTE — Progress Notes (Unsigned)
Name: Marissa Nelson   MRN: [id number]    DOB: 16-Oct-1950   Date:11/11/2022       Progress Note  Chief Complaint  Patient presents with   Follow-up   Hypertension   Hyperlipidemia     Subjective:   Marissa Nelson is a 72 y.o. female, presents to clinic for routine f/up on chronic conditions  HTN on losartan 25, triamterene and BB, BP at goal today, good compliance and no SE or concerns BP Readings from Last 3 Encounters:  11/11/22 114/76  09/09/22 118/88  05/01/22 126/72    HLD on crestor 5 mg- SE/myalgia with atorvastatin 20 mg, we tried holding statin and then switched to crestor 5 - looks like since dec only #90 of crestor have been filled  Lab Results  Component Value Date   CHOL 204 (H) 05/01/2022   HDL 55 05/01/2022   LDLCALC 117 (H) 05/01/2022   TRIG 207 (H) 05/01/2022   CHOLHDL 3.7 05/01/2022  Due for recheck of labs  Prediabetes not on meds Lab Results  Component Value Date   HGBA1C 6.2 (H) 05/01/2022   Not able to afford 200 mg allopurinol, so back on 100 mg daily, no recent gout flares  Lab Results  Component Value Date   LABURIC 7.8 (H) 05/01/2022    Due for nephrology f/up with labs in Oct - 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 , Disp: , Rfl:    Ascorbic Acid (VITAMIN C) 1000 MG tablet, Take 1,000 mg by mouth in the morning., Disp: , Rfl:    calcium carbonate (OS-CAL) 600 MG TABS tablet, Take 600 mg by mouth daily with breakfast.,  Disp: , Rfl:    Cholecalciferol (VITAMIN D3) 50 MCG (2000 UT) TABS, Take 2,000 Units by mouth daily., Disp: , Rfl:    Cyanocobalamin (VITAMIN B-12) 500 MCG SUBL, Place 500 mcg under the tongue daily., Disp: , Rfl:    latanoprost (XALATAN) 0.005 % ophthalmic solution, Place 1 drop into both eyes at bedtime., Disp: , Rfl:    metoprolol succinate (TOPROL-XL) 25 MG 24 hr tablet, Take 1 tablet (25 mg total) by mouth daily., Disp: 90 tablet, Rfl: 3   omeprazole (PRILOSEC) 40 MG capsule, Take 1 capsule (40 mg total) by mouth daily., Disp: 90 capsule, Rfl: 0   allopurinol (ZYLOPRIM) 100 MG tablet, Take 2 tablets (200 mg total) by mouth daily., Disp: 180 tablet, Rfl: 1   losartan (COZAAR) 25 MG tablet, Take 1 tablet (25 mg total) by mouth daily., Disp: 90 tablet, Rfl: 1   rosuvastatin (CRESTOR) 5 MG tablet, Take 1 tablet (5 mg total) by mouth daily., Disp: 90 tablet, Rfl: 1   triamterene-hydrochlorothiazide (MAXZIDE-25) 37.5-25 MG tablet, Take 1 tablet by mouth daily., Disp: 90 tablet, Rfl: 1  Patient Active Problem List   Diagnosis Date Noted   Acute gout involving toe of right foot 01/22/2022   History of ductal carcinoma in situ (DCIS)  of breast 10/15/2021   S/P hip replacement 10/11/2020   Hyperuricemia 08/25/2019   Anemia in chronic kidney disease 05/25/2019   Benign hypertensive kidney disease with chronic kidney disease 05/25/2019   Vitamin B12 deficiency 05/07/2017   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   Arthralgia 10/04/2016   Pre-diabetes 06/20/2016   Chronic kidney disease, stage III (moderate) (HCC) 11/15/2015   Vitamin D deficiency 10/29/2015   HLD (hyperlipidemia) 06/15/2015   Class 1 obesity with serious comorbidity and body mass index (BMI) of 32.0 to 32.9 in adult 06/15/2015   Chronic left shoulder pain 11/08/2014   Benign essential HTN 12/07/2006    Past Surgical History:  Procedure Laterality Date   ABDOMINAL HYSTERECTOMY     BREAST BIOPSY Right 01/15/2016     cylinder shape calcs UOQ, FIBROADENOMATOUS   BREAST BIOPSY Right 12/30/2016   stereo bx for calcifications: DCIS UOQ, ribbon shaped   BREAST BIOPSY Right 01/08/2017   LOQ x shaped, FIBROADENOMATOUS CHANGE WITH   BREAST BIOPSY Left 03/09/2019   calcs bx, x marker neg   BREAST LUMPECTOMY Right 01/30/2018   DCIS clear margins   BREAST LUMPECTOMY WITH NEEDLE LOCALIZATION Right 01/30/2017   Procedure: BREAST LUMPECTOMY WITH NEEDLE LOCALIZATION;  Surgeon: Ancil Linsey, MD;  Location: [facility];  Service: General;  Laterality: Right;   COLONOSCOPY WITH PROPOFOL N/A 11/19/2016   Procedure: COLONOSCOPY WITH PROPOFOL;  Surgeon: Wyline Mood, MD;  Location[facility];  Service: Endoscopy;  Laterality: N/A;   COLONOSCOPY WITH PROPOFOL N/A 08/28/2021   Procedure: COLONOSCOPY WITH PROPOFOL;  Surgeon: Wyline Mood, MD;  Location: [facility];  Service: Gastroenterology;  Laterality: N/A;   ESOPHAGOGASTRODUODENOSCOPY N/A 08/28/2021   Procedure: ESOPHAGOGASTRODUODENOSCOPY (EGD);  Surgeon: Wyline Mood, MD;  Location: [facility];  Service: Gastroenterology;  Laterality: N/A;   ESOPHAGOGASTRODUODENOSCOPY (EGD) WITH PROPOFOL N/A 11/19/2016   Procedure: ESOPHAGOGASTRODUODENOSCOPY (EGD) WITH PROPOFOL;  Surgeon: Wyline Mood, MD;  Location: [facility];  Service: Endoscopy;  Laterality: N/A;   GIVENS CAPSULE STUDY N/A 12/12/2016   Procedure: GIVENS CAPSULE STUDY;  Surgeon: Wyline Mood, MD;  Location: [facility];  Service: Gastroenterology;  Laterality: N/A;   TOTAL HIP ARTHROPLASTY Right 10/11/2020   Procedure: TOTAL HIP ARTHROPLASTY ANTERIOR APPROACH;  Surgeon: Kennedy Bucker, MD;  Location: [facility];  Service: Orthopedics;  Laterality: Right;   TOTAL MASTECTOMY Right 01/30/2017   Error    Family History  Problem Relation Age of Onset   Cancer Mother 86       liver cancer   Diabetes Mother    Hypertension Mother    Glomerulonephritis Father    Diabetes Sister     Glomerulonephritis Sister    Cancer Sister 1       throat cancer   Heart disease Sister    Diabetes Sister    Diabetes Sister    COPD Sister    Diabetes Sister    Breast cancer Cousin        Currently undergoing systemic chemotherapy (01/2017)   Varicose Veins Neg Hx     Social History   Tobacco Use   Smoking status: Never   Smokeless tobacco: Never  Vaping Use   Vaping Use: Never used  Substance Use Topics   Alcohol use: No    Alcohol/week: 0.0 standard drinks of alcohol   Drug use: No     Allergies  Allergen Reactions   Penicillins Anaphylaxis   Shellfish Allergy Swelling    angioedema    Health Maintenance  Topic  Date Due   Medicare Annual Wellness (AWV)  12/11/2022   COVID-19 Vaccine (3 - Moderna risk series) 11/26/2022 (Originally 08/13/2019)   Zoster Vaccines- Shingrix (1 of 2) 02/10/2023 (Originally 10/31/1967)   INFLUENZA VACCINE  12/04/2022   MAMMOGRAM  03/20/2023   DEXA SCAN  06/12/2023   DTaP/Tdap/Td (2 - Td or Tdap) 08/03/2023   Colonoscopy  08/29/2031   Pneumonia Vaccine 69+ Years old  Completed   Hepatitis C Screening  Completed   HPV VACCINES  Aged Out    Chart Review Today: I personally reviewed active problem list, medication list, allergies, family history, social history, health maintenance, notes from last encounter, lab results, imaging with the patient/caregiver today.   Review of Systems  Constitutional: Negative.   HENT: Negative.    Eyes: Negative.   Respiratory: Negative.    Cardiovascular: Negative.   Gastrointestinal: Negative.   Endocrine: Negative.   Genitourinary: Negative.   Musculoskeletal: Negative.   Skin: Negative.   Allergic/Immunologic: Negative.   Neurological: Negative.   Hematological: Negative.   Psychiatric/Behavioral: Negative.    All other systems reviewed and are negative.    Objective:   Vitals:   11/11/22 1033  BP: 114/76  Pulse: 91  Resp: 16  Temp: (!) 97.4 F (36.3 C)  TempSrc: Oral  SpO2:  99%  Weight: 175 lb 14.4 oz (79.8 kg)  Height: 5\' 1"  (1.549 m)    Body mass index is 33.24 kg/m.  Physical Exam Vitals and nursing note reviewed.  Constitutional:      General: She is not in acute distress.    Appearance: Normal appearance. She is well-developed and well-groomed. She is obese. She is not ill-appearing, toxic-appearing or diaphoretic.  HENT:     Head: Normocephalic and atraumatic.     Right Ear: External ear normal. There is no impacted cerumen.     Left Ear: External ear normal. There is no impacted cerumen.     Nose: Nose normal.  Eyes:     General:        Right eye: No discharge.        Left eye: No discharge.     Conjunctiva/sclera: Conjunctivae normal.  Neck:     Trachea: No tracheal deviation.  Cardiovascular:     Rate and Rhythm: Normal rate and regular rhythm.     Pulses: Normal pulses.     Heart sounds: Normal heart sounds. No murmur heard.    No friction rub. No gallop.  Pulmonary:     Effort: Pulmonary effort is normal. No respiratory distress.     Breath sounds: Normal breath sounds. No stridor. No wheezing, rhonchi or rales.  Abdominal:     General: Bowel sounds are normal. There is no distension.     Palpations: Abdomen is soft.  Musculoskeletal:     Right lower leg: No edema.     Left lower leg: No edema.  Skin:    General: Skin is warm and dry.     Findings: No rash.  Neurological:     Mental Status: She is alert. Mental status is at baseline.     Motor: No abnormal muscle tone.     Coordination: Coordination normal.  Psychiatric:        Mood and Affect: Mood normal.        Behavior: Behavior normal. Behavior is cooperative.         Assessment & Plan:   Problem List Items Addressed This Visit       Cardiovascular and  Mediastinum   Benign essential HTN - Primary    BP well controlled on triamterene, losartan 25 mg and BB BP Readings from Last 3 Encounters:  11/11/22 114/76  09/09/22 118/88  05/01/22 126/72         Relevant Medications   triamterene-hydrochlorothiazide (MAXZIDE-25) 37.5-25 MG tablet   rosuvastatin (CRESTOR) 5 MG tablet   losartan (COZAAR) 25 MG tablet   Other Relevant Orders   COMPLETE METABOLIC PANEL WITH GFR (Completed)     Musculoskeletal and Integument   Acute gout involving toe of right foot    No recent flares, Uric acid has not been at goal, only on 100 mg allopurinol, the 200 mg capsule dose change was several hundred dollars so she has continued 100 mg daily  Discussed labs and med changes- on good rx 100 mg allopurinol with taking 200 mg a day looks affordable - will recheck labs and see if she is near goal or should try to increase dose. Thankfully no recent gout flares      Relevant Medications   allopurinol (ZYLOPRIM) 100 MG tablet     Genitourinary   Chronic kidney disease, stage III (moderate) (HCC)    Reviewed most recent labs and office visit with nephrology      Relevant Orders   COMPLETE METABOLIC PANEL WITH GFR (Completed)     Other   HLD (hyperlipidemia)    Pt last OV reported Myalgias with atorvastatin which were persistent despite reducing the dose She did go off of the statin for a few weeks and then we tried Crestor 5 mg Due for recheck of labs today      Relevant Medications   triamterene-hydrochlorothiazide (MAXZIDE-25) 37.5-25 MG tablet   rosuvastatin (CRESTOR) 5 MG tablet   losartan (COZAAR) 25 MG tablet   Other Relevant Orders   COMPLETE METABOLIC PANEL WITH GFR (Completed)   Lipid panel (Completed)   Class 1 obesity with serious comorbidity and body mass index (BMI) of 32.0 to 32.9 in adult    Wt Readings from Last 5 Encounters:  11/11/22 175 lb 14.4 oz (79.8 kg)  09/09/22 174 lb (78.9 kg)  05/01/22 172 lb 4.8 oz (78.2 kg)  03/05/22 171 lb (77.6 kg)  01/22/22 174 lb 8 oz (79.2 kg)   BMI Readings from Last 5 Encounters:  11/11/22 33.24 kg/m  09/09/22 32.88 kg/m  05/01/22 32.56 kg/m  03/05/22 32.31 kg/m  01/22/22 32.97 kg/m    BMI and weight increased slightly, multiple associated comorbidities, some limitation on physical activity due to joint pain, associated hyperlipidemia hypertension chronic kidney disease, prediabetes      Pre-diabetes    Last A1c was 6.2 She is not on medications, no specific diet or lifestyle efforts she has gained a small amount of weight and we will recheck A1c today      Relevant Orders   COMPLETE METABOLIC PANEL WITH GFR (Completed)   Hemoglobin A1c (Completed)   Hyperuricemia    See above- gout for plan      Relevant Medications   allopurinol (ZYLOPRIM) 100 MG tablet   Other Relevant Orders   COMPLETE METABOLIC PANEL WITH GFR (Completed)   Uric acid (Completed)   Other Visit Diagnoses     Encounter for medication monitoring       Relevant Orders   COMPLETE METABOLIC PANEL WITH GFR (Completed)   Lipid panel (Completed)   Hemoglobin A1c (Completed)   CBC with Differential/Platelet (Completed)   Uric acid (Completed)   Bilateral lower extremity edema  LE edema is still well controlled and she has been able to avoid using diuretic   Relevant Orders   COMPLETE METABOLIC PANEL WITH GFR (Completed)   CBC with Differential/Platelet (Completed)   Epigastric abdominal pain       PPI OTC   Myalgia due to statin       SE and unable to tolerate atorvastatin, trial of crestor   Relevant Orders   COMPLETE METABOLIC PANEL WITH GFR (Completed)   Lipid panel (Completed)   Left hand pain       onset years ago, no improvement with brace, resting, she would like consult - referral ordered   Relevant Orders   Ambulatory referral to Orthopedics   Chronic pain of left thumb       see above  - no crepitus, some ttp on exam, no erythema, refer to ortho   Relevant Orders   Ambulatory referral to Orthopedics        Return in about 6 months (around 05/14/2023) for Routine follow-up.   Danelle Berry, PA-C 11/11/22 10:45 AM

## 2022-11-12 LAB — LIPID PANEL
Cholesterol: 250 mg/dL — ABNORMAL HIGH (ref <200)
HDL: 79 mg/dL (ref >=50)
LDL Cholesterol (Calc): 149 mg/dL (calc) — ABNORMAL HIGH
Non-HDL Cholesterol (Calc): 171 mg/dL (calc) — ABNORMAL HIGH (ref <130)
Total CHOL/HDL Ratio: 3.2 (calc) (ref <5.0)
Triglycerides: 108 mg/dL (ref <150)

## 2022-11-12 LAB — COMPLETE METABOLIC PANEL WITH GFR
AG Ratio: 1.4 (calc) (ref 1.0–2.5)
ALT: 18 U/L (ref 6–29)
AST: 18 U/L (ref 10–35)
Albumin: 4.1 g/dL (ref 3.6–5.1)
Alkaline phosphatase (APISO): 104 U/L (ref 37–153)
BUN/Creatinine Ratio: 16 (calc) (ref 6–22)
BUN: 24 mg/dL (ref 7–25)
CO2: 31 mmol/L (ref 20–32)
Calcium: 10 mg/dL (ref 8.6–10.4)
Chloride: 101 mmol/L (ref 98–110)
Creat: 1.46 mg/dL — ABNORMAL HIGH (ref 0.60–1.00)
Globulin: 3 g/dL (calc) (ref 1.9–3.7)
Glucose, Bld: 91 mg/dL (ref 65–99)
Potassium: 4.1 mmol/L (ref 3.5–5.3)
Sodium: 140 mmol/L (ref 135–146)
Total Bilirubin: 0.3 mg/dL (ref 0.2–1.2)
Total Protein: 7.1 g/dL (ref 6.1–8.1)
eGFR: 38 mL/min/{1.73_m2} — ABNORMAL LOW (ref >=60)

## 2022-11-12 LAB — CBC WITH DIFFERENTIAL/PLATELET
Eosinophils Absolute: 88 cells/uL (ref 15–500)
HCT: 38 % (ref 35.0–45.0)
Hemoglobin: 12.7 g/dL (ref 11.7–15.5)
MCH: 32 pg (ref 27.0–33.0)
Neutro Abs: 3025 cells/uL (ref 1500–7800)
WBC: 5.5 10*3/uL (ref 3.8–10.8)

## 2022-11-12 LAB — HEMOGLOBIN A1C
Hgb A1c MFr Bld: 6.1 % of total Hgb — ABNORMAL HIGH (ref <5.7)
Mean Plasma Glucose: 128 mg/dL
eAG (mmol/L): 7.1 mmol/L

## 2022-11-12 LAB — URIC ACID: Uric Acid, Serum: 8.8 mg/dL — ABNORMAL HIGH (ref 2.5–7.0)

## 2022-11-13 ENCOUNTER — Encounter: Payer: Self-pay | Admitting: Family Medicine

## 2022-11-13 MED ORDER — ALLOPURINOL 100 MG PO TABS
200.0000 mg | ORAL_TABLET | Freq: Every day | ORAL | 1 refills | Status: DC
Start: 2022-11-13 — End: 2023-05-18

## 2022-11-13 NOTE — Assessment & Plan Note (Signed)
Last A1c was 6.2 She is not on medications, no specific diet or lifestyle efforts she has gained a small amount of weight and we will recheck A1c today

## 2022-11-13 NOTE — Assessment & Plan Note (Signed)
See above- gout for plan

## 2022-11-13 NOTE — Assessment & Plan Note (Signed)
No recent flares, Uric acid has not been at goal, only on 100 mg allopurinol, the 200 mg capsule dose change was several hundred dollars so she has continued 100 mg daily  Discussed labs and med changes- on good rx 100 mg allopurinol with taking 200 mg a day looks affordable - will recheck labs and see if she is near goal or should try to increase dose. Thankfully no recent gout flares

## 2022-11-13 NOTE — Assessment & Plan Note (Signed)
Reviewed most recent labs and office visit with nephrology 

## 2022-11-13 NOTE — Assessment & Plan Note (Signed)
Wt Readings from Last 5 Encounters:  11/11/22 175 lb 14.4 oz (79.8 kg)  09/09/22 174 lb (78.9 kg)  05/01/22 172 lb 4.8 oz (78.2 kg)  03/05/22 171 lb (77.6 kg)  01/22/22 174 lb 8 oz (79.2 kg)   BMI Readings from Last 5 Encounters:  11/11/22 33.24 kg/m  09/09/22 32.88 kg/m  05/01/22 32.56 kg/m  03/05/22 32.31 kg/m  01/22/22 32.97 kg/m   BMI and weight increased slightly, multiple associated comorbidities, some limitation on physical activity due to joint pain, associated hyperlipidemia hypertension chronic kidney disease, prediabetes

## 2022-11-13 NOTE — Assessment & Plan Note (Signed)
Pt last OV reported Myalgias with atorvastatin which were persistent despite reducing the dose She did go off of the statin for a few weeks and then we tried Crestor 5 mg Due for recheck of labs today

## 2022-11-13 NOTE — Assessment & Plan Note (Signed)
BP well controlled on triamterene, losartan 25 mg and BB BP Readings from Last 3 Encounters:  11/11/22 114/76  09/09/22 118/88  05/01/22 126/72

## 2022-11-14 ENCOUNTER — Other Ambulatory Visit: Payer: Self-pay | Admitting: Family Medicine

## 2022-11-14 MED ORDER — EZETIMIBE 10 MG PO TABS: 10.0000 mg | ORAL_TABLET | Freq: Every day | ORAL | 3 refills | Status: DC

## 2022-12-16 ENCOUNTER — Ambulatory Visit: Payer: BC Managed Care – PPO | Admitting: Family Medicine

## 2023-01-07 ENCOUNTER — Encounter: Payer: Self-pay | Admitting: Family Medicine

## 2023-01-07 ENCOUNTER — Ambulatory Visit (INDEPENDENT_AMBULATORY_CARE_PROVIDER_SITE_OTHER): Payer: BC Managed Care – PPO | Admitting: Family Medicine

## 2023-01-07 VITALS — BP 116/74 | HR 84 | Temp 97.9°F | Resp 16 | Ht 61.0 in | Wt 182.5 lb

## 2023-01-07 DIAGNOSIS — Z1231 Encounter for screening mammogram for malignant neoplasm of breast: Secondary | ICD-10-CM

## 2023-01-07 DIAGNOSIS — Z780 Asymptomatic menopausal state: Secondary | ICD-10-CM | POA: Diagnosis not present

## 2023-01-07 DIAGNOSIS — M25551 Pain in right hip: Secondary | ICD-10-CM

## 2023-01-07 DIAGNOSIS — Z23 Encounter for immunization: Secondary | ICD-10-CM

## 2023-01-07 DIAGNOSIS — Z0000 Encounter for general adult medical examination without abnormal findings: Secondary | ICD-10-CM

## 2023-01-07 NOTE — Progress Notes (Signed)
Patient: Marissa Nelson, Female    DOB: [birth year], 72 y.o.   MRN: [id number] Danelle Berry, PA-C Visit Date: 01/07/2023  Today's Provider: Danelle Berry, PA-C   Chief Complaint  Patient presents with   Medicare Wellness   Subjective:   Annual physical exam:  Marissa Nelson is a 72 y.o. female who presents today for complete physical exam:  Exercise/Activity:  busy/active with work, but hip pain limits activity Diet/nutrition:  tries to eat healthy Sleep: no concerns  SDOH Screenings   Food Insecurity: No Food Insecurity (01/07/2023)  Housing: Low Risk  (01/07/2023)  Transportation Needs: No Transportation Needs (12/10/2021)  Utilities: Not At Risk (01/07/2023)  Alcohol Screen: Low Risk  (01/07/2023)  Depression (PHQ2-9): Low Risk  (11/11/2022)  Financial Resource Strain: Low Risk  (01/07/2023)  Physical Activity: Insufficiently Active (01/07/2023)  Social Connections: Moderately Integrated (01/07/2023)  Stress: No Stress Concern Present (01/07/2023)  Tobacco Use: Low Risk  (01/07/2023)  Health Literacy: Adequate Health Literacy (01/07/2023)   MWV also done at this time  USPSTF grade A and B recommendations - reviewed and addressed today  Depression:  Phq 9 completed today by patient, was reviewed by me with patient in the room PHQ score is neg, pt feels good    11/11/2022   10:24 AM 05/01/2022    2:38 PM 01/22/2022    9:28 AM 12/10/2021    1:37 PM  PHQ 2/9 Scores  PHQ - 2 Score 0 0 0 0  PHQ- 9 Score 0 0 0 3      11/11/2022   10:24 AM 05/01/2022    2:38 PM 01/22/2022    9:28 AM 12/10/2021    1:37 PM 10/15/2021    8:07 AM  Depression screen PHQ 2/9  Decreased Interest 0 0 0 0 0  Down, Depressed, Hopeless 0 0 0 0 0  PHQ - 2 Score 0 0 0 0 0  Altered sleeping 0 0 0 3 0  Tired, decreased energy 0 0 0 0 0  Change in appetite 0 0 0 0 0  Feeling bad or failure about yourself  0 0 0 0 0  Trouble concentrating 0 0 0 0 0  Moving slowly or fidgety/restless 0 0 0 0 0  Suicidal thoughts  0 0 0 0 0  PHQ-9 Score 0 0 0 3 0  Difficult doing work/chores Not difficult at all Not difficult at all Not difficult at all Not difficult at all Not difficult at all    Alcohol screening: Flowsheet Row Office Visit from 01/07/2023 in [facility]  AUDIT-C Score 0       Immunizations and Health Maintenance: Health Maintenance  Topic Date Due   INFLUENZA VACCINE  12/04/2022   Medicare Annual Wellness (AWV)  12/11/2022   COVID-19 Vaccine (3 - Moderna risk series) 01/23/2023 (Originally 08/13/2019)   Zoster Vaccines- Shingrix (1 of 2) 02/10/2023 (Originally [birth year]31/1967)   MAMMOGRAM  03/20/2023   DEXA SCAN  06/12/2023   DTaP/Tdap/Td (2 - Td or Tdap) 08/03/2023   Colonoscopy  08/29/2031   Pneumonia Vaccine 21+ Years old  Completed   Hepatitis C Screening  Completed   HPV VACCINES  Aged Out     Hep C Screening: done previously  STD testing and prevention (HIV/chl/gon/syphilis):  see above, no additional testing desired by pt today - none needed  Intimate partner violence:  safe at home, lives with husband  Sexual History/Pain during Intercourse: Married, denies issues/problems  Menstrual History/LMP/Abnormal Bleeding:  no AUB or pelvic sx or concerns No LMP recorded. Patient has had a hysterectomy.  Incontinence Symptoms:   mild stress incontinence/urge intermittent   Breast cancer:  due nov - ordered by onc Last Mammogram: *see HM list above  Cervical cancer screening: aged out  Osteoporosis:  due next year - done by oncology Discussion on osteoporosis per age, including high calcium and vitamin D supplementation, weight bearing exercises  Skin cancer:  Hx of skin CA -  NO Discussed atypical lesions   Colorectal cancer:   Colonoscopy is UTD recently done   Lung cancer:   Low Dose CT Chest recommended if Age 71-80 years, 20 pack-year currently smoking OR have quit w/in 15years. Patient does not qualify.    Social History   Tobacco Use    Smoking status: Never   Smokeless tobacco: Never  Vaping Use   Vaping status: Never Used  Substance Use Topics   Alcohol use: No    Alcohol/week: 0.0 standard drinks of alcohol   Drug use: No     Flowsheet Row Office Visit from 01/07/2023 in [facility]  AUDIT-C Score 0       Family History  Problem Relation Age of Onset   Cancer Mother 54       liver cancer   Diabetes Mother    Hypertension Mother    Glomerulonephritis Father    Diabetes Sister    Glomerulonephritis Sister    Cancer Sister 21       throat cancer   Heart disease Sister    Diabetes Sister    Diabetes Sister    COPD Sister    Diabetes Sister    Breast cancer Cousin        Currently undergoing systemic chemotherapy (01/2017)   Varicose Veins Neg Hx      Blood pressure/Hypertension: BP Readings from Last 3 Encounters:  01/07/23 116/74  11/11/22 114/76  09/09/22 118/88    Weight/Obesity: Wt Readings from Last 3 Encounters:  01/07/23 182 lb 8 oz (82.8 kg)  11/11/22 175 lb 14.4 oz (79.8 kg)  09/09/22 174 lb (78.9 kg)   BMI Readings from Last 3 Encounters:  01/07/23 34.48 kg/m  11/11/22 33.24 kg/m  09/09/22 32.88 kg/m     Lipids:  Lab Results  Component Value Date   CHOL 250 (H) 11/11/2022   CHOL 204 (H) 05/01/2022   CHOL 223 (H) 04/09/2021   Lab Results  Component Value Date   HDL 79 11/11/2022   HDL 55 05/01/2022   HDL 61 04/09/2021   Lab Results  Component Value Date   LDLCALC 149 (H) 11/11/2022   LDLCALC 117 (H) 05/01/2022   LDLCALC 140 (H) 04/09/2021   Lab Results  Component Value Date   TRIG 108 11/11/2022   TRIG 207 (H) 05/01/2022   TRIG 104 04/09/2021   Lab Results  Component Value Date   CHOLHDL 3.2 11/11/2022   CHOLHDL 3.7 05/01/2022   CHOLHDL 3.7 04/09/2021   No results found for: "LDLDIRECT" Based on the results of lipid panel his/her cardiovascular risk factor ( using Poole Cohort )  in the next 10 years is: The 10-year ASCVD risk  score (Arnett DK, et al., 2019) is: 17.1%   Values used to calculate the score:     Age: 1 years     Sex: Female     Is Non-Hispanic African American: Yes     Diabetic: No     Tobacco smoker: No  Systolic Blood Pressure: 116 mmHg     Is BP treated: Yes     HDL Cholesterol: 79 mg/dL     Total Cholesterol: 250 mg/dL  Glucose:  Glucose, Bld  Date Value Ref Range Status  11/11/2022 91 65 - 99 mg/dL Final    Comment:    .            Fasting reference interval .   01/19/2022 105 (H) 70 - 99 mg/dL Final    Comment:    Glucose reference range applies only to samples taken after fasting for at least 8 hours.  08/15/2021 72 65 - 99 mg/dL Final    Comment:    .            Fasting reference interval .     Advanced Care Planning:  A voluntary discussion about advance care planning including the explanation and discussion of advance directives.   Discussed health care proxy and Living will, and the patient was able to identify a health care proxy as spouse, husband.   Patient does have a living will at present time.  She has at home will bring in copy  Social History       Social History   Socioeconomic History   Marital status: Married    Spouse name: Trinisha Rojek   Number of children: 2   Years of education: Not on file   Highest education level: Not on file  Occupational History   Occupation: Works in [employer] [place], [place])    Employer: [employer]  Tobacco Use   Smoking status: Never   Smokeless tobacco: Never  Vaping Use   Vaping status: Never Used  Substance and Sexual Activity   Alcohol use: No    Alcohol/week: 0.0 standard drinks of alcohol   Drug use: No   Sexual activity: Not Currently  Other Topics Concern   Not on file  Social History Narrative   Not on file   Social Determinants of Health   Financial Resource Strain: Low Risk  (01/07/2023)   Overall Financial Resource Strain (CARDIA)    Difficulty of Paying Living  Expenses: Not hard at all  Food Insecurity: No Food Insecurity (01/07/2023)   Hunger Vital Sign    Worried About Running Out of Food in the Last Year: Never true    Ran Out of Food in the Last Year: Never true  Transportation Needs: No Transportation Needs (12/10/2021)   PRAPARE - Administrator, Civil Service (Medical): No    Lack of Transportation (Non-Medical): No  Physical Activity: Insufficiently Active (01/07/2023)   Exercise Vital Sign    Days of Exercise per Week: 2 days    Minutes of Exercise per Session: 20 min  Stress: No Stress Concern Present (01/07/2023)   [employer] of Occupational Health - Occupational Stress Questionnaire    Feeling of Stress : Not at all  Social Connections: Moderately Integrated (01/07/2023)   Social Connection and Isolation Panel [NHANES]    Frequency of Communication with Friends and Family: More than three times a week    Frequency of Social Gatherings with Friends and Family: More than three times a week    Attends Religious Services: More than 4 times per year    Active Member of [employer] or Organizations: No    Attends Banker Meetings: Never    Marital Status: Married    Family History        Family History  Problem Relation Age of Onset   Cancer Mother 24       liver cancer   Diabetes Mother    Hypertension Mother    Glomerulonephritis Father    Diabetes Sister    Glomerulonephritis Sister    Cancer Sister 66       throat cancer   Heart disease Sister    Diabetes Sister    Diabetes Sister    COPD Sister    Diabetes Sister    Breast cancer Cousin        Currently undergoing systemic chemotherapy (01/2017)   Varicose Veins Neg Hx     Patient Active Problem List   Diagnosis Date Noted   Acute gout involving toe of right foot 01/22/2022   History of ductal carcinoma in situ (DCIS) of breast 10/15/2021   S/P hip replacement 10/11/2020   Hyperuricemia 08/25/2019   Anemia in chronic kidney disease 05/25/2019    Benign hypertensive kidney disease with chronic kidney disease 05/25/2019   Vitamin B12 deficiency 05/07/2017   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   Arthralgia 10/04/2016   Pre-diabetes 06/20/2016   Chronic kidney disease, stage III (moderate) (HCC) 11/15/2015   Vitamin D deficiency 10/29/2015   HLD (hyperlipidemia) 06/15/2015   Class 1 obesity with serious comorbidity and body mass index (BMI) of 32.0 to 32.9 in adult 06/15/2015   Chronic left shoulder pain 11/08/2014   Benign essential HTN 12/07/2006    Past Surgical History:  Procedure Laterality Date   ABDOMINAL HYSTERECTOMY     BREAST BIOPSY Right 01/15/2016    cylinder shape calcs UOQ, FIBROADENOMATOUS   BREAST BIOPSY Right 12/30/2016   stereo bx for calcifications: DCIS UOQ, ribbon shaped   BREAST BIOPSY Right 01/08/2017   LOQ x shaped, FIBROADENOMATOUS CHANGE WITH   BREAST BIOPSY Left 03/09/2019   calcs bx, x marker neg   BREAST LUMPECTOMY Right 01/30/2018   DCIS clear margins   BREAST LUMPECTOMY WITH NEEDLE LOCALIZATION Right 01/30/2017   Procedure: BREAST LUMPECTOMY WITH NEEDLE LOCALIZATION;  Surgeon: Ancil Linsey, MD;  Location: [facility] ORS;  Service: General;  Laterality: Right;   COLONOSCOPY WITH PROPOFOL N/A 11/19/2016   Procedure: COLONOSCOPY WITH PROPOFOL;  Surgeon: Wyline Mood, MD;  Location: [facility];  Service: Endoscopy;  Laterality: N/A;   COLONOSCOPY WITH PROPOFOL N/A 08/28/2021   Procedure: COLONOSCOPY WITH PROPOFOL;  Surgeon: Wyline Mood, MD;  Location: [facility] ENDOSCOPY;  Service: Gastroenterology;  Laterality: N/A;   ESOPHAGOGASTRODUODENOSCOPY N/A 08/28/2021   Procedure: ESOPHAGOGASTRODUODENOSCOPY (EGD);  Surgeon: Wyline Mood, MD;  Location: [facility] ENDOSCOPY;  Service: Gastroenterology;  Laterality: N/A;   ESOPHAGOGASTRODUODENOSCOPY (EGD) WITH PROPOFOL N/A 11/19/2016   Procedure: ESOPHAGOGASTRODUODENOSCOPY (EGD) WITH PROPOFOL;  Surgeon: Wyline Mood, MD;  Location: [facility]  [facility];  Service: Endoscopy;  Laterality: N/A;   GIVENS CAPSULE STUDY N/A 12/12/2016   Procedure: GIVENS CAPSULE STUDY;  Surgeon: Wyline Mood, MD;  Location: [facility] ENDOSCOPY;  Service: Gastroenterology;  Laterality: N/A;   TOTAL HIP ARTHROPLASTY Right 10/11/2020   Procedure: TOTAL HIP ARTHROPLASTY ANTERIOR APPROACH;  Surgeon: Kennedy Bucker, MD;  Location: [facility] ORS;  Service: Orthopedics;  Laterality: Right;   TOTAL MASTECTOMY Right 01/30/2017   Error     Current Outpatient Medications:    acetaminophen (TYLENOL) 325 MG tablet, Take 650 mg by mouth every 6 (six) hours as needed for mild pain or headache. , Disp: , Rfl:    allopurinol (ZYLOPRIM) 100 MG tablet, Take 2 tablets (200 mg total) by mouth daily., Disp: 180  tablet, Rfl: 1   Ascorbic Acid (VITAMIN C) 1000 MG tablet, Take 1,000 mg by mouth in the morning., Disp: , Rfl:    calcium carbonate (OS-CAL) 600 MG TABS tablet, Take 600 mg by mouth daily with breakfast., Disp: , Rfl:    Cholecalciferol (VITAMIN D3) 50 MCG (2000 UT) TABS, Take 2,000 Units by mouth daily., Disp: , Rfl:    Cyanocobalamin (VITAMIN B-12) 500 MCG SUBL, Place 500 mcg under the tongue daily., Disp: , Rfl:    ezetimibe (ZETIA) 10 MG tablet, Take 1 tablet (10 mg total) by mouth daily., Disp: 90 tablet, Rfl: 3   latanoprost (XALATAN) 0.005 % ophthalmic solution, Place 1 drop into both eyes at bedtime., Disp: , Rfl:    losartan (COZAAR) 25 MG tablet, Take 1 tablet (25 mg total) by mouth daily., Disp: 90 tablet, Rfl: 1   metoprolol succinate (TOPROL-XL) 25 MG 24 hr tablet, Take 1 tablet (25 mg total) by mouth daily., Disp: 90 tablet, Rfl: 3   omeprazole (PRILOSEC) 40 MG capsule, Take 1 capsule (40 mg total) by mouth daily., Disp: 90 capsule, Rfl: 0   rosuvastatin (CRESTOR) 5 MG tablet, Take 1 tablet (5 mg total) by mouth daily., Disp: 90 tablet, Rfl: 1   torsemide (DEMADEX) 10 MG tablet, Take by mouth., Disp: , Rfl:    triamterene-hydrochlorothiazide (MAXZIDE-25) 37.5-25 MG  tablet, Take 1 tablet by mouth daily., Disp: 90 tablet, Rfl: 1  Allergies  Allergen Reactions   Penicillins Anaphylaxis   Shellfish Allergy Swelling    angioedema    Patient Care Team: Danelle Berry, PA-C as PCP - General (Family Medicine)   Chart Review: I personally reviewed active problem list, medication list, allergies, family history, social history, health maintenance, notes from last encounter, lab results, imaging with the patient/caregiver today.   Review of Systems  Constitutional: Negative.   HENT: Negative.    Eyes: Negative.   Respiratory: Negative.    Cardiovascular: Negative.   Gastrointestinal: Negative.   Endocrine: Negative.   Genitourinary: Negative.   Musculoskeletal:  Positive for arthralgias.  Skin: Negative.   Allergic/Immunologic: Negative.   Neurological: Negative.   Hematological: Negative.   Psychiatric/Behavioral: Negative.    All other systems reviewed and are negative.         Objective:   Vitals:  Vitals:   01/07/23 1450  BP: 116/74  Pulse: 84  Resp: 16  Temp: 97.9 F (36.6 C)  TempSrc: Oral  SpO2: 99%  Weight: 182 lb 8 oz (82.8 kg)  Height: 5\' 1"  (1.549 m)    Body mass index is 34.48 kg/m.  Physical Exam Vitals and nursing note reviewed.  Constitutional:      General: She is not in acute distress.    Appearance: Normal appearance. She is well-developed. She is obese. She is not ill-appearing, toxic-appearing or diaphoretic.  HENT:     Head: Normocephalic and atraumatic.     Right Ear: External ear normal.     Left Ear: External ear normal.     Nose: Nose normal.  Eyes:     General:        Right eye: No discharge.        Left eye: No discharge.     Conjunctiva/sclera: Conjunctivae normal.  Neck:     Trachea: No tracheal deviation.  Cardiovascular:     Rate and Rhythm: Normal rate and regular rhythm.     Pulses: Normal pulses.     Heart sounds: Normal heart sounds. No murmur heard.  No friction rub. No gallop.   Pulmonary:     Effort: Pulmonary effort is normal. No respiratory distress.     Breath sounds: Normal breath sounds. No stridor. No wheezing, rhonchi or rales.  Abdominal:     General: Bowel sounds are normal.     Palpations: Abdomen is soft.  Musculoskeletal:     Right lower leg: Edema present.     Left lower leg: Edema present.  Skin:    General: Skin is warm and dry.     Findings: No rash.  Neurological:     Mental Status: She is alert. Mental status is at baseline.     Motor: No abnormal muscle tone.     Coordination: Coordination normal.     Gait: Gait abnormal.  Psychiatric:        Mood and Affect: Mood normal.        Behavior: Behavior normal.       Fall Risk:    11/11/2022   10:24 AM 05/01/2022    2:33 PM 01/22/2022    9:28 AM 12/10/2021    1:41 PM 10/15/2021    8:07 AM  Fall Risk   Falls in the past year? 0 0 0 0 0  Number falls in past yr: 0 0 0 0 0  Injury with Fall? 0 0 0 0 0  Risk for fall due to : No Fall Risks  No Fall Risks No Fall Risks No Fall Risks  Follow up Falls prevention discussed;Education provided;Falls evaluation completed  Falls prevention discussed Falls evaluation completed Falls prevention discussed;Education provided    Functional Status Survey:   See MWV  Assessment & Plan:    CPE completed today  USPSTF grade A and B recommendations reviewed with patient; age-appropriate recommendations, preventive care, screening tests, etc discussed and encouraged; healthy living encouraged; see AVS for patient education given to patient  Discussed importance of 150 minutes of physical activity weekly, AHA exercise recommendations given to pt in AVS/handout  Discussed importance of healthy diet:  eating lean meats and proteins, avoiding trans fats and saturated fats, avoid simple sugars and excessive carbs in diet, eat 6 servings of fruit/vegetables daily and drink plenty of water and avoid sweet beverages.    Recommended pt to do annual eye exam  and routine dental exams/cleanings  Depression, alcohol, fall screening completed as documented above and per flowsheets  Advance Care planning information and packet discussed and offered today, encouraged pt to discuss with family members/spouse/partner/friends and complete Advanced directive packet and bring copy to office   Reviewed Health Maintenance: Health Maintenance  Topic Date Due   INFLUENZA VACCINE  12/04/2022   Medicare Annual Wellness (AWV)  12/11/2022   COVID-19 Vaccine (3 - Moderna risk series) 01/23/2023 (Originally 08/13/2019)   Zoster Vaccines- Shingrix (1 of 2) 02/10/2023 (Originally 10[birth year]31/1967)   MAMMOGRAM  03/20/2023   DEXA SCAN  06/12/2023   DTaP/Tdap/Td (2 - Td or Tdap) 08/03/2023   Colonoscopy  08/29/2031   Pneumonia Vaccine 73+ Years old  Completed   Hepatitis C Screening  Completed   HPV VACCINES  Aged Out    Immunizations: Immunization History  Administered Date(s) Administered   Fluad Quad(high Dose 65+) 01/19/2019, 02/03/2020, 01/18/2021, 02/24/2022   Influenza, High Dose Seasonal PF 03/18/2016, 02/24/2018   Influenza,inj,Quad PF,6+ Mos 02/10/2017   Moderna Sars-Covid-2 Vaccination 06/18/2019, 07/16/2019   Pneumococcal Conjugate-13 11/15/2015   Pneumococcal Polysaccharide-23 06/08/2017   Tdap 08/02/2013   Vaccines:  HPV: up to at age 57 , ask  insurance if age between 78-45  Shingrix: 7-64 yo and ask insurance if covered when patient above 65 yo Pneumonia: completed - option to do 20 educated and discussed with patient. Flu: due- she will do next month educated and discussed with patient.     ICD-10-CM   1. Adult general medical exam  Z00.00    CPE done    2. Medicare annual wellness visit, subsequent  Z00.00    MWV done    3. Encounter for screening mammogram for malignant neoplasm of breast  Z12.31    ordered and scheduled in Nov per oncology    4. Postmenopausal estrogen deficiency  Z78.0    dexa due next year per oncology    5. Need  for shingles vaccine  Z23    discussed and encouraged pt to get    6. Need for influenza vaccination  Z23    she'll return next month for flu clinic    7. Right hip pain  M25.551    s/p joint replacement, she followed up with ortho, did injection, no improvement, affecting gait/mobility on bad days, offered new referral          Danelle Berry, PA-C 01/07/23 3:19 PM  [facility]

## 2023-01-07 NOTE — Progress Notes (Signed)
Subjective:   Marissa Nelson is a 72 y.o. female who presents for Medicare Annual (Subsequent) preventive examination.  Review of Systems:   Cardiac Risk Factors include: advanced age (>64men, >28 women);dyslipidemia;hypertension;obesity (BMI >30kg/m2)     Objective:     Vitals: BP 116/74   Pulse 84   Temp 97.9 F (36.6 C) (Oral)   Resp 16   Ht 5\' 1"  (1.549 m)   Wt 182 lb 8 oz (82.8 kg)   SpO2 99%   BMI 34.48 kg/m   Body mass index is 34.48 kg/m.     01/07/2023    3:07 PM 09/09/2022    1:22 PM 03/05/2022    2:18 PM 02/01/2022    4:48 PM 09/02/2021    2:35 PM 09/02/2021    2:32 PM 08/28/2021   12:14 PM  Advanced Directives  Does Patient Have a Medical Advance Directive? Yes Yes Yes No Yes No Yes  Type of Estate agent of [place];Living will Healthcare Power of [place];Living will Living will  Living will  Living will  Copy of Healthcare Power of Attorney in Chart? No - copy requested        Would patient like information on creating a medical advance directive?     No - Patient declined No - Patient declined     Tobacco Social History   Tobacco Use  Smoking Status Never  Smokeless Tobacco Never     Counseling given: Not Answered   Clinical Intake:  Pre-visit preparation completed: Yes  Pain : 0-10 Pain Score: 8  Pain Type: Acute pain Pain Location: Hip Pain Orientation: Right Pain Descriptors / Indicators: Aching Pain Onset: In the past 7 days Pain Frequency: Constant     BMI - recorded: 34 Nutritional Status: BMI > 30  Obese Nutritional Risks: None Diabetes: No  How often do you need to have someone help you when you read instructions, pamphlets, or other written materials from your doctor or pharmacy?: 1 - Never What is the last grade level you completed in school?: Associates degree  Interpreter Needed?: No     Past Medical History:  Diagnosis Date   Abnormal mammogram of right breast 01/17/2016   Recommendation: Six  month follow-up diagnostic mammogram of the right breast for an additional group of probably benign Calcifications. 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   RIGHT, August 2018   Family history of diabetes mellitus 06/12/2016   Mother and several other relatives   HLD (hyperlipidemia) 06/15/2015   Hx of right breast biopsy 01/17/2016   Hyperlipidemia    Hypertension    Medication monitoring encounter 11/15/2015   Motion sickness    ocean ships   Numbness and tingling in left hand 11/07/2014   Obesity 10/25/2015   Obesity (BMI 30.0-34.9) 10/25/2015   Personal history of radiation therapy 2018   right breast ca   PONV (postoperative nausea and vomiting)    patient stated she vomits a lot after anesthsia   Positive ANA (antinuclear antibody) 10/04/2016   Prediabetes    Preventative health care 11/15/2015   Vitamin D deficiency 10/29/2015   Past Surgical History:  Procedure  Laterality Date   ABDOMINAL HYSTERECTOMY     BREAST BIOPSY Right 01/15/2016    cylinder shape calcs UOQ, FIBROADENOMATOUS   BREAST BIOPSY Right 12/30/2016   stereo bx for calcifications: DCIS UOQ, ribbon shaped   BREAST BIOPSY Right 01/08/2017   LOQ x shaped, FIBROADENOMATOUS CHANGE WITH   BREAST BIOPSY Left 03/09/2019   calcs bx, x marker neg   BREAST LUMPECTOMY Right 01/30/2018   DCIS clear margins   BREAST LUMPECTOMY WITH NEEDLE LOCALIZATION Right 01/30/2017   Procedure: BREAST LUMPECTOMY WITH NEEDLE LOCALIZATION;  Surgeon: Ancil Linsey, MD;  Location: [facility] ORS;  Service: General;  Laterality: Right;   COLONOSCOPY WITH PROPOFOL N/A 11/19/2016   Procedure: COLONOSCOPY WITH PROPOFOL;  Surgeon: Wyline Mood, MD;  Location:  [facility];  Service: Endoscopy;  Laterality: N/A;   COLONOSCOPY WITH PROPOFOL N/A 08/28/2021   Procedure: COLONOSCOPY WITH PROPOFOL;  Surgeon: Wyline Mood, MD;  Location[facility] END[facility];  Service: Gastroenterology;  Laterality: N/A;   ESOPHAGOGASTRODUODENOSCOPY N/A 08/28/2021   Procedure: ESOPHAGOGASTRODUODENOSCOPY (EGD);  Surgeon: Wyline Mood, MD;  Location: [facility] ENDOSCOPY;  Service: Gastroenterology;  Laterality: N/A;   ESOPHAGOGASTRODUODENOSCOPY (EGD) WITH PROPOFOL N/A 11/19/2016   Procedure: ESOPHAGOGASTRODUODENOSCOPY (EGD) WITH PROPOFOL;  Surgeon: Wyline Mood, MD;  Location: [facility];  Service: Endoscopy;  Laterality: N/A;   GIVENS CAPSULE STUDY N/A 12/12/2016   Procedure: GIVENS CAPSULE STUDY;  Surgeon: Wyline Mood, MD;  Location: [facility] ENDOSCOPY;  Service: Gastroenterology;  Laterality: N/A;   TOTAL HIP ARTHROPLASTY Right 10/11/2020   Procedure: TOTAL HIP ARTHROPLASTY ANTERIOR APPROACH;  Surgeon: Kennedy Bucker, MD;  Location: [facility] ORS;  Service: Orthopedics;  Laterality: Right;   TOTAL MASTECTOMY Right 01/30/2017   Error   Family History  Problem Relation Age of Onset   Cancer Mother 81       liver cancer   Diabetes Mother    Hypertension Mother    Glomerulonephritis Father    Diabetes Sister    Glomerulonephritis Sister    Cancer Sister 38       throat cancer   Heart disease Sister    Diabetes Sister    Diabetes Sister    COPD Sister    Diabetes Sister    Breast cancer Cousin        Currently undergoing systemic chemotherapy (01/2017)   Varicose Veins Neg Hx    Social History   Socioeconomic History   Marital status: Married    Spouse name: Egan Zamor   Number of children: 2   Years of education: Not on file   Highest education level: Not on file  Occupational History   Occupation: Works in [employer] [place], [place])    Employer: [employer]  Tobacco Use   Smoking status: Never   Smokeless tobacco: Never  Vaping Use    Vaping status: Never Used  Substance and Sexual Activity   Alcohol use: No    Alcohol/week: 0.0 standard drinks of alcohol   Drug use: No   Sexual activity: Not Currently  Other Topics Concern   Not on file  Social History Narrative   Not on file   Social Determinants of Health   Financial Resource Strain: Low Risk  (01/07/2023)   Overall Financial Resource Strain (CARDIA)    Difficulty of Paying Living Expenses: Not hard at all  Food Insecurity: No Food Insecurity (01/07/2023)   Hunger Vital Sign    Worried About Running Out of Food in the Last Year: Never true    Ran Out  of Food in the Last Year: Never true  Transportation Needs: No Transportation Needs (12/10/2021)   PRAPARE - Administrator, Civil Service (Medical): No    Lack of Transportation (Non-Medical): No  Physical Activity: Insufficiently Active (01/07/2023)   Exercise Vital Sign    Days of Exercise per Week: 2 days    Minutes of Exercise per Session: 20 min  Stress: No Stress Concern Present (01/07/2023)   [employer] of Occupational Health - Occupational Stress Questionnaire    Feeling of Stress : Not at all  Social Connections: Moderately Integrated (01/07/2023)   Social Connection and Isolation Panel [NHANES]    Frequency of Communication with Friends and Family: More than three times a week    Frequency of Social Gatherings with Friends and Family: More than three times a week    Attends Religious Services: More than 4 times per year    Active Member of [employer] or Organizations: No    Attends Banker Meetings: Never    Marital Status: Married    Outpatient Encounter Medications as of 01/07/2023  Medication Sig   acetaminophen (TYLENOL) 325 MG tablet Take 650 mg by mouth every 6 (six) hours as needed for mild pain or headache.    allopurinol (ZYLOPRIM) 100 MG tablet Take 2 tablets (200 mg total) by mouth daily.   Ascorbic Acid (VITAMIN C) 1000 MG tablet Take 1,000 mg by mouth in the morning.    calcium carbonate (OS-CAL) 600 MG TABS tablet Take 600 mg by mouth daily with breakfast.   Cholecalciferol (VITAMIN D3) 50 MCG (2000 UT) TABS Take 2,000 Units by mouth daily.   Cyanocobalamin (VITAMIN B-12) 500 MCG SUBL Place 500 mcg under the tongue daily.   ezetimibe (ZETIA) 10 MG tablet Take 1 tablet (10 mg total) by mouth daily.   latanoprost (XALATAN) 0.005 % ophthalmic solution Place 1 drop into both eyes at bedtime.   losartan (COZAAR) 25 MG tablet Take 1 tablet (25 mg total) by mouth daily.   metoprolol succinate (TOPROL-XL) 25 MG 24 hr tablet Take 1 tablet (25 mg total) by mouth daily.   omeprazole (PRILOSEC) 40 MG capsule Take 1 capsule (40 mg total) by mouth daily.   rosuvastatin (CRESTOR) 5 MG tablet Take 1 tablet (5 mg total) by mouth daily.   torsemide (DEMADEX) 10 MG tablet Take by mouth.   triamterene-hydrochlorothiazide (MAXZIDE-25) 37.5-25 MG tablet Take 1 tablet by mouth daily.   No facility-administered encounter medications on file as of 01/07/2023.    Activities of Daily Living    01/07/2023    3:39 PM 11/11/2022   10:24 AM  In your present state of health, do you have any difficulty performing the following activities:  Hearing? 0 0  Vision? 1 0  Difficulty concentrating or making decisions? 0 0  Walking or climbing stairs? 1 0  Comment when hip pain is flared up   Dressing or bathing? 0 0  Doing errands, shopping? 0 0  Preparing Food and eating ? N   Using the Toilet? N   In the past six months, have you accidently leaked urine? N   Do you have problems with loss of bowel control? N   Managing your Medications? N   Managing your Finances? N     Patient Care Team: Danelle Berry, PA-C as PCP - General (Family Medicine)    Assessment:   This is a routine wellness examination for [facility].  Exercise Activities and Dietary recommendations  Weight bearing  exercise as able, aerobic 150 min per week, heart healthy diet recommended (on AVS)     Fall  Risk:    11/11/2022   10:24 AM 05/01/2022    2:33 PM 01/22/2022    9:28 AM 12/10/2021    1:41 PM 10/15/2021    8:07 AM  Fall Risk   Falls in the past year? 0 0 0 0 0  Number falls in past yr: 0 0 0 0 0  Injury with Fall? 0 0 0 0 0  Risk for fall due to : No Fall Risks  No Fall Risks No Fall Risks No Fall Risks  Follow up Falls prevention discussed;Education provided;Falls evaluation completed  Falls prevention discussed Falls evaluation completed Falls prevention discussed;Education provided    FALL RISK PREVENTION PERTAINING TO THE HOME:  Any stairs in or around the home? Yes  If so, are there any without handrails? No   Home free of loose throw rugs in walkways, pet beds, electrical cords, etc? Yes  Adequate lighting in your home to reduce risk of falls? Yes   ASSISTIVE DEVICES UTILIZED TO PREVENT FALLS:  Life alert? No  Use of a cane, walker or w/c? Yes  Grab bars in the bathroom? Yes  Shower chair or bench in shower? No  Elevated toilet seat or a handicapped toilet? Yes    01/07/23 1511  Medicare Risk at Home  Any stairs in or around the home? No  If so, are there any without handrails? No  Home free of loose throw rugs in walkways, pet beds, electrical cords, etc? No  Adequate lighting in your home to reduce risk of falls? No  Life alert? No  Use of a cane, walker or w/c? Yes  Grab bars in the bathroom? Yes  Shower chair or bench in shower? No  Elevated toilet seat or a handicapped toilet? Yes     DME ORDERS:  DME order needed?  No   TIMED UP AND GO:  Was the test performed? Yes .  Length of time to ambulate 10 feet: 08 sec.   GAIT:  Appearance of gait: Gait steady and fast with the use of an assistive device.  Education: Fall risk prevention has been discussed.  Intervention(s) required? Yes   DME/home health order needed?  No    Depression Screen    11/11/2022   10:24 AM 05/01/2022    2:38 PM 01/22/2022    9:28 AM 12/10/2021    1:37 PM  PHQ 2/9 Scores   PHQ - 2 Score 0 0 0 0  PHQ- 9 Score 0 0 0 3     Cognitive Function        12/10/2021    1:42 PM  6CIT Screen  What Year? 0 points  What month? 0 points  What time? 0 points  Count back from 20 0 points  Repeat phrase 0 points    Immunization History  Administered Date(s) Administered   Fluad Quad(high Dose 65+) 01/19/2019, 02/03/2020, 01/18/2021, 02/24/2022   Influenza, High Dose Seasonal PF 03/18/2016, 02/24/2018   Influenza,inj,Quad PF,6+ Mos 02/10/2017   Moderna Sars-Covid-2 Vaccination 06/18/2019, 07/16/2019   Pneumococcal Conjugate-13 11/15/2015   Pneumococcal Polysaccharide-23 06/08/2017   Tdap 08/02/2013    Qualifies for Shingles Vaccine? Yes . Due for Shingrix. Education has been provided regarding the importance of this vaccine. Pt has been advised to call insurance company to determine out of pocket expense. Advised may also receive vaccine at local pharmacy or Health Dept. Verbalized acceptance  and understanding.  Tdap: Although this vaccine is not a covered service during a Wellness Exam, does the patient still wish to receive this vaccine today?   Completed 08/02/2013 .  Education has been provided regarding the importance of this vaccine. Advised may receive this vaccine at local pharmacy or Health Dept. Aware to provide a copy of the vaccination record if obtained from local pharmacy or Health Dept. Verbalized acceptance and understanding.  Flu Vaccine: Due for Flu vaccine. Does the patient want to receive this vaccine today?  Yes . Education has been provided regarding the importance of this vaccine but still declined. Advised may receive this vaccine at local pharmacy or Health Dept. Aware to provide a copy of the vaccination record if obtained from local pharmacy or Health Dept. Verbalized acceptance and understanding.  Pneumococcal Vaccine: Completed  Covid-19 Vaccine: Completed vaccines  Screening Tests Health Maintenance  Topic Date Due   INFLUENZA  VACCINE  12/04/2022   COVID-19 Vaccine (3 - Moderna risk series) 01/23/2023 (Originally 08/13/2019)   Zoster Vaccines- Shingrix (1 of 2) 02/10/2023 (Originally 10/31/1969)   MAMMOGRAM  03/20/2023   DEXA SCAN  06/12/2023   DTaP/Tdap/Td (2 - Td or Tdap) 08/03/2023   Medicare Annual Wellness (AWV)  01/07/2024   Colonoscopy  08/29/2031   Pneumonia Vaccine 81+ Years old  Completed   Hepatitis C Screening  Completed   HPV VACCINES  Aged Out    Cancer Screenings:  Colorectal Screening: Completed 08/28/2021. Repeat every 10 years  Mammogram: Scheduled 03/23/2023  Bone Density: Completed 06/11/2021. Results reflect NORMAL. Repeat every 2 years.   Lung Cancer Screening: (Low Dose CT Chest recommended if Age 15-80 years, 30 pack-year currently smoking OR have quit w/in 15years.) does not qualify.   Lung Cancer Screening Referral: An Epic message has been sent to Glenna Fellows, RN (Oncology Nurse Navigator) regarding the possible need for this exam. Ines Bloomer will review the patient's chart to determine if the patient truly qualifies for the exam. If the patient qualifies, Ines Bloomer will order the Low Dose CT of the chest to facilitate the scheduling of this exam.  Additional Screening:  Hepatitis C Screening: Completed 11/15/2015  Vision Screening: Recommended annual ophthalmology exams for early detection of glaucoma and other disorders of the eye. Is the patient up to date with their annual eye exam?  Yes  Who is the provider or what is the name of the office in which the pt attends annual eye exams? [facility]   Dental Screening: Recommended annual dental exams for proper oral hygiene  Community Resource Referral:  CRR required this visit?  No       Plan:  I have personally reviewed and addressed the Medicare Annual Wellness questionnaire and have noted the following in the patient's chart:  A. Medical and social history B. Use of alcohol, tobacco or illicit drugs  C. Current  medications and supplements D. Functional ability and status E.  Nutritional status F.  Physical activity G. Advance directives H. List of other physicians I.  Hospitalizations, surgeries, and ER visits in previous 12 months J.  Vitals K. Screenings such as hearing and vision if needed, cognitive and depression L. Referrals and appointments   In addition, I have reviewed and discussed with patient certain preventive protocols, quality metrics, and best practice recommendations. A written personalized care plan for preventive services as well as general preventive health recommendations were provided to patient.  Signed,    Danelle Berry, PA-C  01/07/2023

## 2023-01-07 NOTE — Patient Instructions (Addendum)
[name] , Thank you for taking time to come for your Medicare Wellness Visit. I appreciate your ongoing commitment to your health goals. Please review the following plan we discussed and let me know if I can assist you in the future.   Prior Goals   Goals      Activity and Exercise Increased     Evidence-based guidance:  Review current exercise levels.  Assess patient perspective on exercise or activity level, barriers to increasing activity, motivation and readiness for change.  Recommend or set healthy exercise goal based on individual tolerance.  Encourage small steps toward making change in amount of exercise or activity.  Urge reduction of sedentary activities or screen time.  Promote group activities within the community or with family or support person.  Consider referral to rehabiliation therapist for assessment and exercise/activity plan.   Notes:      Weight (lb) < 165 lb (74.8 kg)     Patient states she would like to lose 25 lbs with healthy eating and physical activity         This is a list of the screening recommended for you and due dates:  Due for shingrix and flu Pneumococcal 20 optional  Health Maintenance  Topic Date Due   Flu Shot  12/04/2022   Medicare Annual Wellness Visit  12/11/2022   COVID-19 Vaccine (3 - Moderna risk series) 01/23/2023*   Zoster (Shingles) Vaccine (1 of 2) 02/10/2023*   Mammogram  03/20/2023   DEXA scan (bone density measurement)  06/12/2023   DTaP/Tdap/Td vaccine (2 - Td or Tdap) 08/03/2023   Colon Cancer Screening  08/29/2031   Pneumonia Vaccine  Completed   Hepatitis C Screening  Completed   HPV Vaccine  Aged Out  *Topic was postponed. The date shown is not the original due date.  Preventive Care 80 Years and Older, Female Preventive care refers to lifestyle choices and visits with your health care provider that can promote health and wellness. Preventive care visits are also called wellness exams. What can I expect for  my preventive care visit? Counseling Your health care provider may ask you questions about your: Medical history, including: Past medical problems. Family medical history. Pregnancy and menstrual history. History of falls. Current health, including: Memory and ability to understand (cognition). Emotional well-being. Home life and relationship well-being. Sexual activity and sexual health. Lifestyle, including: Alcohol, nicotine or tobacco, and drug use. Access to firearms. Diet, exercise, and sleep habits. Work and work Astronomer. Sunscreen use. Safety issues such as seatbelt and bike helmet use. Physical exam Your health care provider will check your: Height and weight. These may be used to calculate your BMI (body mass index). BMI is a measurement that tells if you are at a healthy weight. Waist circumference. This measures the distance around your waistline. This measurement also tells if you are at a healthy weight and may help predict your risk of certain diseases, such as type 2 diabetes and high blood pressure. Heart rate and blood pressure. Body temperature. Skin for abnormal spots. What immunizations do I need?  Vaccines are usually given at various ages, according to a schedule. Your health care provider will recommend vaccines for you based on your age, medical history, and lifestyle or other factors, such as travel or where you work. What tests do I need? Screening Your health care provider may recommend screening tests for certain conditions. This may include: Lipid and cholesterol levels. Hepatitis C test. Hepatitis B test. HIV (human immunodeficiency  virus) test. STI (sexually transmitted infection) testing, if you are at risk. Lung cancer screening. Colorectal cancer screening. Diabetes screening. This is done by checking your blood sugar (glucose) after you have not eaten for a while (fasting). Mammogram. Talk with your health care provider about how often  you should have regular mammograms. BRCA-related cancer screening. This may be done if you have a family history of breast, ovarian, tubal, or peritoneal cancers. Bone density scan. This is done to screen for osteoporosis. Talk with your health care provider about your test results, treatment options, and if necessary, the need for more tests. Follow these instructions at home: Eating and drinking  Eat a diet that includes fresh fruits and vegetables, whole grains, lean protein, and low-fat dairy products. Limit your intake of foods with high amounts of sugar, saturated fats, and salt. Take vitamin and mineral supplements as recommended by your health care provider. Do not drink alcohol if your health care provider tells you not to drink. If you drink alcohol: Limit how much you have to 0-1 drink a day. Know how much alcohol is in your drink. In the [place] one drink equals one 12 oz bottle of beer (355 mL), one 5 oz glass of wine (148 mL), or one 1 oz glass of hard liquor (44 mL). Lifestyle Brush your teeth every morning and night with fluoride toothpaste. Floss one time each day. Exercise for at least 30 minutes 5 or more days each week. Do not use any products that contain nicotine or tobacco. These products include cigarettes, chewing tobacco, and vaping devices, such as e-cigarettes. If you need help quitting, ask your health care provider. Do not use drugs. If you are sexually active, practice safe sex. Use a condom or other form of protection in order to prevent STIs. Take aspirin only as told by your health care provider. Make sure that you understand how much to take and what form to take. Work with your health care provider to find out whether it is safe and beneficial for you to take aspirin daily. Ask your health care provider if you need to take a cholesterol-lowering medicine (statin). Find healthy ways to manage stress, such as: Meditation, yoga, or listening to  music. Journaling. Talking to a trusted person. Spending time with friends and family. Minimize exposure to UV radiation to reduce your risk of skin cancer. Safety Always wear your seat belt while driving or riding in a vehicle. Do not drive: If you have been drinking alcohol. Do not ride with someone who has been drinking. When you are tired or distracted. While texting. If you have been using any mind-altering substances or drugs. Wear a helmet and other protective equipment during sports activities. If you have firearms in your house, make sure you follow all gun safety procedures. What's next? Visit your health care provider once a year for an annual wellness visit. Ask your health care provider how often you should have your eyes and teeth checked. Stay up to date on all vaccines. This information is not intended to replace advice given to you by your health care provider. Make sure you discuss any questions you have with your health care provider. Document Revised: 10/17/2020 Document Reviewed: 10/17/2020 Elsevier Patient Education  2024 [employer].

## 2023-01-23 DIAGNOSIS — J029 Acute pharyngitis, unspecified: Secondary | ICD-10-CM | POA: Diagnosis not present

## 2023-01-23 DIAGNOSIS — B9689 Other specified bacterial agents as the cause of diseases classified elsewhere: Secondary | ICD-10-CM | POA: Diagnosis not present

## 2023-01-23 DIAGNOSIS — J019 Acute sinusitis, unspecified: Secondary | ICD-10-CM | POA: Diagnosis not present

## 2023-01-23 DIAGNOSIS — J209 Acute bronchitis, unspecified: Secondary | ICD-10-CM | POA: Diagnosis not present

## 2023-01-23 DIAGNOSIS — Z03818 Encounter for observation for suspected exposure to other biological agents ruled out: Secondary | ICD-10-CM | POA: Diagnosis not present

## 2023-02-19 DIAGNOSIS — I129 Hypertensive chronic kidney disease with stage 1 through stage 4 chronic kidney disease, or unspecified chronic kidney disease: Secondary | ICD-10-CM | POA: Diagnosis not present

## 2023-02-19 DIAGNOSIS — D631 Anemia in chronic kidney disease: Secondary | ICD-10-CM | POA: Diagnosis not present

## 2023-02-19 DIAGNOSIS — R809 Proteinuria, unspecified: Secondary | ICD-10-CM | POA: Diagnosis not present

## 2023-02-19 DIAGNOSIS — N1832 Chronic kidney disease, stage 3b: Secondary | ICD-10-CM | POA: Diagnosis not present

## 2023-02-25 ENCOUNTER — Ambulatory Visit (INDEPENDENT_AMBULATORY_CARE_PROVIDER_SITE_OTHER): Payer: BC Managed Care – PPO | Admitting: Family Medicine

## 2023-02-25 ENCOUNTER — Encounter: Payer: Self-pay | Admitting: Family Medicine

## 2023-02-25 VITALS — BP 132/76 | HR 87 | Resp 16 | Ht 61.0 in | Wt 182.0 lb

## 2023-02-25 DIAGNOSIS — J329 Chronic sinusitis, unspecified: Secondary | ICD-10-CM | POA: Diagnosis not present

## 2023-02-25 MED ORDER — LORATADINE-PSEUDOEPHEDRINE ER 10-240 MG PO TB24: 1.0000 | ORAL_TABLET | Freq: Every day | ORAL | 1 refills | Status: AC | PRN

## 2023-02-25 MED ORDER — FLUTICASONE PROPIONATE 50 MCG/ACT NA SUSP: 2.0000 | Freq: Every day | NASAL | 6 refills | Status: DC

## 2023-02-25 MED ORDER — DOXYCYCLINE HYCLATE 100 MG PO TABS
100.0000 mg | ORAL_TABLET | Freq: Two times a day (BID) | ORAL | 0 refills | Status: AC
Start: 2023-02-25 — End: 2023-03-04

## 2023-02-25 NOTE — Progress Notes (Signed)
Patient ID: Marissa Nelson, female    DOB: 01/14/1951, 72 y.o.   MRN: [id number]  PCP: Danelle Berry, PA-C  Chief Complaint  Patient presents with   Headache    X1 week    Subjective:   Marissa Nelson is a 72 y.o. female, presents to clinic with CC of the following:  Sinus/eye pain and pressure for 1 week and just not feeling well She had a day or two of fatigue and less appetite She has some postnasal drip and throat clearing No fever, sweats, chills SOB She tried tylenol but nothing else  Headache  This is a new problem. The current episode started in the past 7 days. The problem occurs constantly. The problem has been unchanged. Pain location: frontal/behind eyes. The pain does not radiate. Associated symptoms include coughing (throat clearing) and sinus pressure. Pertinent negatives include no ear pain, neck pain, sore throat or swollen glands.  Sinusitis This is a new problem. The current episode started in the past 7 days. The problem is unchanged. There has been no fever. Associated symptoms include congestion, coughing (throat clearing), headaches and sinus pressure. Pertinent negatives include no chills, diaphoresis, ear pain, hoarse voice, neck pain, shortness of breath, sneezing, sore throat or swollen glands. Past treatments include acetaminophen. The treatment provided no relief.      Patient Active Problem List   Diagnosis Date Noted   Acute gout involving toe of right foot 01/22/2022   History of ductal carcinoma in situ (DCIS) of breast 10/15/2021   S/P hip replacement 10/11/2020   Hyperuricemia 08/25/2019   Anemia in chronic kidney disease 05/25/2019   Benign hypertensive kidney disease with chronic kidney disease 05/25/2019   Vitamin B12 deficiency 05/07/2017   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   Arthralgia 10/04/2016   Pre-diabetes 06/20/2016   Chronic kidney disease, stage III (moderate) (HCC) 11/15/2015   Vitamin D  deficiency 10/29/2015   HLD (hyperlipidemia) 06/15/2015   Class 1 obesity with serious comorbidity and body mass index (BMI) of 32.0 to 32.9 in adult 06/15/2015   Chronic left shoulder pain 11/08/2014   Benign essential HTN 12/07/2006      Current Outpatient Medications:    acetaminophen (TYLENOL) 325 MG tablet, Take 650 mg by mouth every 6 (six) hours as needed for mild pain or headache. , Disp: , Rfl:    allopurinol (ZYLOPRIM) 100 MG tablet, Take 2 tablets (200 mg total) by mouth daily., Disp: 180 tablet, Rfl: 1   Ascorbic Acid (VITAMIN C) 1000 MG tablet, Take 1,000 mg by mouth in the morning., Disp: , Rfl:    calcium carbonate (OS-CAL) 600 MG TABS tablet, Take 600 mg by mouth daily with breakfast., Disp: , Rfl:    Cholecalciferol (VITAMIN D3) 50 MCG (2000 UT) TABS, Take 2,000 Units by mouth daily., Disp: , Rfl:    Cyanocobalamin (VITAMIN B-12) 500 MCG SUBL, Place 500 mcg under the tongue daily., Disp: , Rfl:    doxycycline (VIBRA-TABS) 100 MG tablet, Take 1 tablet (100 mg total) by mouth 2 (two) times daily for 7 days., Disp: 14 tablet, Rfl: 0   ezetimibe (ZETIA) 10 MG tablet, Take 1 tablet (10 mg total) by mouth daily., Disp: 90 tablet, Rfl: 3   fluticasone (FLONASE) 50 MCG/ACT nasal spray, Place 2 sprays into both nostrils daily., Disp: 16 g, Rfl: 6   latanoprost (XALATAN) 0.005 % ophthalmic solution, Place 1 drop into both eyes at bedtime., Disp: , Rfl:  loratadine-pseudoephedrine (CLARITIN-D 24-HOUR) 10-240 MG 24 hr tablet, Take 1 tablet by mouth daily as needed for allergies., Disp: 30 tablet, Rfl: 1   losartan (COZAAR) 25 MG tablet, Take 1 tablet (25 mg total) by mouth daily., Disp: 90 tablet, Rfl: 1   metoprolol succinate (TOPROL-XL) 25 MG 24 hr tablet, Take 1 tablet (25 mg total) by mouth daily., Disp: 90 tablet, Rfl: 3   omeprazole (PRILOSEC) 40 MG capsule, Take 1 capsule (40 mg total) by mouth daily., Disp: 90 capsule, Rfl: 0   rosuvastatin (CRESTOR) 5 MG tablet, Take 1 tablet  (5 mg total) by mouth daily., Disp: 90 tablet, Rfl: 1   torsemide (DEMADEX) 10 MG tablet, Take by mouth., Disp: , Rfl:    triamterene-hydrochlorothiazide (MAXZIDE-25) 37.5-25 MG tablet, Take 1 tablet by mouth daily., Disp: 90 tablet, Rfl: 1   Allergies  Allergen Reactions   Penicillins Anaphylaxis   Shellfish Allergy Swelling    angioedema     Social History   Tobacco Use   Smoking status: Never   Smokeless tobacco: Never  Vaping Use   Vaping status: Never Used  Substance Use Topics   Alcohol use: No    Alcohol/week: 0.0 standard drinks of alcohol   Drug use: No      Chart Review Today: I personally reviewed active problem list, medication list, allergies, family history, social history, health maintenance, notes from last encounter, lab results, imaging with the patient/caregiver today.   Review of Systems  Constitutional: Negative.  Negative for chills and diaphoresis.  HENT:  Positive for congestion and sinus pressure. Negative for ear pain, hoarse voice, sneezing and sore throat.   Eyes: Negative.   Respiratory:  Positive for cough (throat clearing). Negative for shortness of breath.   Cardiovascular: Negative.   Gastrointestinal: Negative.   Endocrine: Negative.   Genitourinary: Negative.   Musculoskeletal: Negative.  Negative for neck pain.  Skin: Negative.   Allergic/Immunologic: Negative.   Neurological:  Positive for headaches.  Hematological: Negative.   Psychiatric/Behavioral: Negative.    All other systems reviewed and are negative.      Objective:   Vitals:   02/25/23 0858  BP: 132/76  Pulse: 87  Resp: 16  SpO2: 97%  Weight: 182 lb (82.6 kg)  Height: 5\' 1"  (1.549 m)    Body mass index is 34.39 kg/m.  Physical Exam Vitals and nursing note reviewed.  Constitutional:      Appearance: She is well-developed.  HENT:     Head: Normocephalic and atraumatic.     Right Ear: Ear canal and external ear normal.     Left Ear: Ear canal and  external ear normal.     Nose: Mucosal edema, congestion and rhinorrhea present. Rhinorrhea is clear.     Right Turbinates: Enlarged and swollen.     Left Turbinates: Enlarged and swollen.     Right Sinus: Frontal sinus tenderness present. No maxillary sinus tenderness.     Left Sinus: Frontal sinus tenderness present. No maxillary sinus tenderness.     Mouth/Throat:     Mouth: Mucous membranes are moist.     Pharynx: Oropharynx is clear. Uvula midline. Posterior oropharyngeal erythema and postnasal drip present. No pharyngeal swelling, oropharyngeal exudate or uvula swelling.     Tonsils: No tonsillar exudate.  Eyes:     General:        Right eye: No discharge.        Left eye: No discharge.     Conjunctiva/sclera: Conjunctivae normal.  Neck:  Trachea: No tracheal deviation.  Cardiovascular:     Rate and Rhythm: Normal rate and regular rhythm.  Pulmonary:     Effort: Pulmonary effort is normal. No respiratory distress.     Breath sounds: No stridor.  Musculoskeletal:        General: Normal range of motion.  Skin:    General: Skin is warm and dry.     Findings: No rash.  Neurological:     Mental Status: She is alert.     Motor: No abnormal muscle tone.     Coordination: Coordination normal.  Psychiatric:        Behavior: Behavior normal.      Results for orders placed or performed in visit on 11/11/22  COMPLETE METABOLIC PANEL WITH GFR  Result Value Ref Range   Glucose, Bld 91 65 - 99 mg/dL   BUN 24 7 - 25 mg/dL   Creat 8.11 (H) 9.14 - 1.00 mg/dL   eGFR 38 (L) > OR = 60 mL/min/1.53m2   BUN/Creatinine Ratio 16 6 - 22 (calc)   Sodium 140 135 - 146 mmol/L   Potassium 4.1 3.5 - 5.3 mmol/L   Chloride 101 98 - 110 mmol/L   CO2 31 20 - 32 mmol/L   Calcium 10.0 8.6 - 10.4 mg/dL   Total Protein 7.1 6.1 - 8.1 g/dL   Albumin 4.1 3.6 - 5.1 g/dL   Globulin 3.0 1.9 - 3.7 g/dL (calc)   AG Ratio 1.4 1.0 - 2.5 (calc)   Total Bilirubin 0.3 0.2 - 1.2 mg/dL   Alkaline phosphatase  (APISO) 104 37 - 153 U/L   AST 18 10 - 35 U/L   ALT 18 6 - 29 U/L  Lipid panel  Result Value Ref Range   Cholesterol 250 (H) <200 mg/dL   HDL 79 > OR = 50 mg/dL   Triglycerides 782 <956 mg/dL   LDL Cholesterol (Calc) 149 (H) mg/dL (calc)   Total CHOL/HDL Ratio 3.2 <5.0 (calc)   Non-HDL Cholesterol (Calc) 171 (H) <130 mg/dL (calc)  Hemoglobin O1H  Result Value Ref Range   Hgb A1c MFr Bld 6.1 (H) <5.7 % of total Hgb   Mean Plasma Glucose 128 mg/dL   eAG (mmol/L) 7.1 mmol/L  CBC with Differential/Platelet  Result Value Ref Range   WBC 5.5 3.8 - 10.8 Thousand/uL   RBC 3.97 3.80 - 5.10 Million/uL   Hemoglobin 12.7 11.7 - 15.5 g/dL   HCT 08.6 57.8 - 46.9 %   MCV 95.7 80.0 - 100.0 fL   MCH 32.0 27.0 - 33.0 pg   MCHC 33.4 32.0 - 36.0 g/dL   RDW 62.9 52.8 - 41.3 %   Platelets 235 140 - 400 Thousand/uL   MPV 11.2 7.5 - 12.5 fL   Neutro Abs 3,025 1,500 - 7,800 cells/uL   Lymphs Abs 1,766 850 - 3,900 cells/uL   Absolute Monocytes 572 200 - 950 cells/uL   Eosinophils Absolute 88 15 - 500 cells/uL   Basophils Absolute 50 0 - 200 cells/uL   Neutrophils Relative % 55 %   Total Lymphocyte 32.1 %   Monocytes Relative 10.4 %   Eosinophils Relative 1.6 %   Basophils Relative 0.9 %  Uric acid  Result Value Ref Range   Uric Acid, Serum 8.8 (H) 2.5 - 7.0 mg/dL       Assessment & Plan:   1. Rhinosinusitis Sinus pain/pressure HA x 7 d Frontal sinus ttp No fever or acute worsening She hasn't tried any meds  for her sinuses yet - first plan to have her try nasal saline, intranasal steroid spray, start antihistamine +/- sudafed (if she tolerates it) and tylenol and she may be able to improve sx  However if sx worsen she will stop sudafed and start the abx for ABS  - doxycycline (VIBRA-TABS) 100 MG tablet; Take 1 tablet (100 mg total) by mouth 2 (two) times daily for 7 days.  Dispense: 14 tablet; Refill: 0 - loratadine-pseudoephedrine (CLARITIN-D 24-HOUR) 10-240 MG 24 hr tablet; Take 1 tablet  by mouth daily as needed for allergies.  Dispense: 30 tablet; Refill: 1      Danelle Berry, PA-C 02/25/23 11:21 AM

## 2023-02-25 NOTE — Patient Instructions (Addendum)
Sudafed or claritin-d try for 3-5 days with a saline nose spray and a steroid nose spray like flonase and see if those things help decrease the pressure behind your eyes and sinuses. The sudafed may slightly increase your blood pressure or heart rate.  See if you tolerate it and I feel like it would be safe for you to try for a few days - I would try to not let your BP go over 160/100 for long periods of time.  But slightly elevated BP for shorter periods of time should be ok.  If you have any sudden worsening of your eye/sinus pressure/headache then I do want you to start the antibiotic and you do need to stop the sudafed when taking the antibiotic but I would encourage you to continue everything else.   Sinus Infection, Adult A sinus infection is soreness and swelling (inflammation) of your sinuses. Sinuses are hollow spaces in the bones around your face. They are located: Around your eyes. In the middle of your forehead. Behind your nose. In your cheekbones. Your sinuses and nasal passages are lined with a fluid called mucus. Mucus drains out of your sinuses. Swelling can trap mucus in your sinuses. This lets germs (bacteria, virus, or fungus) grow, which leads to infection. Most of the time, this condition is caused by a virus. What are the causes? Allergies. Asthma. Germs. Things that block your nose or sinuses. Growths in the nose (nasal polyps). Chemicals or irritants in the air. A fungus. This is rare. What increases the risk? Having a weak body defense system (immune system). Doing a lot of swimming or diving. Using nasal sprays too much. Smoking. What are the signs or symptoms? The main symptoms of this condition are pain and a feeling of pressure around the sinuses. Other symptoms include: Stuffy nose (congestion). This may make it hard to breathe through your nose. Runny nose (drainage). Soreness, swelling, and warmth in the sinuses. A cough that may get worse at  night. Being unable to smell and taste. Mucus that collects in the throat or the back of the nose (postnasal drip). This may cause a sore throat or bad breath. Being very tired (fatigued). A fever. How is this diagnosed? Your symptoms. Your medical history. A physical exam. Tests to find out if your condition is short-term (acute) or long-term (chronic). Your doctor may: Check your nose for growths (polyps). Check your sinuses using a tool that has a light on one end (endoscope). Check for allergies or germs. Do imaging tests, such as an MRI or CT scan. How is this treated? Treatment for this condition depends on the cause and whether it is short-term or long-term. If caused by a virus, your symptoms should go away on their own within 10 days. You may be given medicines to relieve symptoms. They include: Medicines that shrink swollen tissue in the nose. A spray that treats swelling of the nostrils. Rinses that help get rid of thick mucus in your nose (nasal saline washes). Medicines that treat allergies (antihistamines). Over-the-counter pain relievers. If caused by bacteria, your doctor may wait to see if you will get better without treatment. You may be given antibiotic medicine if you have: A very bad infection. A weak body defense system. If caused by growths in the nose, surgery may be needed. Follow these instructions at home: Medicines Take, use, or apply over-the-counter and prescription medicines only as told by your doctor. These may include nasal sprays. If you were prescribed an antibiotic  medicine, take it as told by your doctor. Do not stop taking it even if you start to feel better. Hydrate and humidify  Drink enough water to keep your pee (urine) pale yellow. Use a cool mist humidifier to keep the humidity level in your home above 50%. Breathe in steam for 10-15 minutes, 3-4 times a day, or as told by your doctor. You can do this in the bathroom while a hot shower is  running. Try not to spend time in cool or dry air. Rest Rest as much as you can. Sleep with your head raised (elevated). Make sure you get enough sleep each night. General instructions  Put a warm, moist washcloth on your face 3-4 times a day, or as often as told by your doctor. Use nasal saline washes as often as told by your doctor. Wash your hands often with soap and water. If you cannot use soap and water, use hand sanitizer. Do not smoke. Avoid being around people who are smoking (secondhand smoke). Keep all follow-up visits. Contact a doctor if: You have a fever. Your symptoms get worse. Your symptoms do not get better within 10 days. Get help right away if: You have a very bad headache. You cannot stop vomiting. You have very bad pain or swelling around your face or eyes. You have trouble seeing. You feel confused. Your neck is stiff. You have trouble breathing. These symptoms may be an emergency. Get help right away. Call 911. Do not wait to see if the symptoms will go away. Do not drive yourself to the hospital. Summary A sinus infection is swelling of your sinuses. Sinuses are hollow spaces in the bones around your face. This condition is caused by tissues in your nose that become inflamed or swollen. This traps germs. These can lead to infection. If you were prescribed an antibiotic medicine, take it as told by your doctor. Do not stop taking it even if you start to feel better. Keep all follow-up visits. This information is not intended to replace advice given to you by your health care provider. Make sure you discuss any questions you have with your health care provider. Document Revised: 03/26/2021 Document Reviewed: 03/26/2021 Elsevier Patient Education  2024 [employer].

## 2023-03-12 DIAGNOSIS — M65311 Trigger thumb, right thumb: Secondary | ICD-10-CM | POA: Diagnosis not present

## 2023-03-23 ENCOUNTER — Ambulatory Visit
Admission: RE | Admit: 2023-03-23 | Discharge: 2023-03-23 | Disposition: A | Discharge status: Home / Self Care | Payer: BC Managed Care – PPO | Source: Ambulatory Visit | Attending: Oncology | Admitting: Oncology

## 2023-03-23 DIAGNOSIS — Z1231 Encounter for screening mammogram for malignant neoplasm of breast: Secondary | ICD-10-CM | POA: Insufficient documentation

## 2023-03-23 DIAGNOSIS — Z08 Encounter for follow-up examination after completed treatment for malignant neoplasm: Secondary | ICD-10-CM | POA: Diagnosis present

## 2023-03-23 DIAGNOSIS — Z86000 Personal history of in-situ neoplasm of breast: Secondary | ICD-10-CM | POA: Diagnosis present

## 2023-05-11 ENCOUNTER — Ambulatory Visit: Payer: BC Managed Care – PPO | Admitting: Family Medicine

## 2023-05-16 ENCOUNTER — Other Ambulatory Visit: Payer: Self-pay | Admitting: Family Medicine

## 2023-05-16 DIAGNOSIS — I10 Essential (primary) hypertension: Secondary | ICD-10-CM

## 2023-05-17 ENCOUNTER — Other Ambulatory Visit: Payer: Self-pay | Admitting: Family Medicine

## 2023-05-17 DIAGNOSIS — E790 Hyperuricemia without signs of inflammatory arthritis and tophaceous disease: Secondary | ICD-10-CM

## 2023-05-17 DIAGNOSIS — E785 Hyperlipidemia, unspecified: Secondary | ICD-10-CM

## 2023-06-08 ENCOUNTER — Ambulatory Visit: Payer: 59 | Admitting: Family Medicine

## 2023-06-08 ENCOUNTER — Encounter: Payer: Self-pay | Admitting: Family Medicine

## 2023-06-08 VITALS — BP 132/68 | HR 90 | Resp 16 | Ht 61.0 in | Wt 184.0 lb

## 2023-06-08 DIAGNOSIS — R7303 Prediabetes: Secondary | ICD-10-CM | POA: Diagnosis not present

## 2023-06-08 DIAGNOSIS — K219 Gastro-esophageal reflux disease without esophagitis: Secondary | ICD-10-CM

## 2023-06-08 DIAGNOSIS — Z6833 Body mass index (BMI) 33.0-33.9, adult: Secondary | ICD-10-CM

## 2023-06-08 DIAGNOSIS — D631 Anemia in chronic kidney disease: Secondary | ICD-10-CM

## 2023-06-08 DIAGNOSIS — N1832 Chronic kidney disease, stage 3b: Secondary | ICD-10-CM

## 2023-06-08 DIAGNOSIS — E785 Hyperlipidemia, unspecified: Secondary | ICD-10-CM

## 2023-06-08 DIAGNOSIS — E538 Deficiency of other specified B group vitamins: Secondary | ICD-10-CM

## 2023-06-08 DIAGNOSIS — E559 Vitamin D deficiency, unspecified: Secondary | ICD-10-CM

## 2023-06-08 DIAGNOSIS — I10 Essential (primary) hypertension: Secondary | ICD-10-CM | POA: Diagnosis not present

## 2023-06-08 DIAGNOSIS — E790 Hyperuricemia without signs of inflammatory arthritis and tophaceous disease: Secondary | ICD-10-CM

## 2023-06-08 MED ORDER — OMEPRAZOLE 40 MG PO CPDR: 40.0000 mg | DELAYED_RELEASE_CAPSULE | Freq: Every day | ORAL | 1 refills | Status: DC

## 2023-06-08 NOTE — Progress Notes (Unsigned)
Name: Marissa Nelson   MRN: [id number]    DOB: Apr 30, 1951   Date:06/08/2023       Progress Note  Chief Complaint  Patient presents with   Medical Management of Chronic Issues   Hypertension     Subjective:   Marissa Nelson is a 73 y.o. female, presents to clinic for routine follow up on chronic conditions  Hyperlipidemia: Currently treated with ***, pt reports *** med compliance Last Lipids: Lab Results  Component Value Date   CHOL 250 (H) 11/11/2022   CHOL 204 (H) 05/01/2022   CHOL 223 (H) 04/09/2021   Lab Results  Component Value Date   HDL 79 11/11/2022   HDL 55 05/01/2022   HDL 61 04/09/2021   Lab Results  Component Value Date   LDLCALC 149 (H) 11/11/2022   LDLCALC 117 (H) 05/01/2022   LDLCALC 140 (H) 04/09/2021   Lab Results  Component Value Date   TRIG 108 11/11/2022   TRIG 207 (H) 05/01/2022   TRIG 104 04/09/2021   Lab Results  Component Value Date   CHOLHDL 3.2 11/11/2022   CHOLHDL 3.7 05/01/2022   CHOLHDL 3.7 04/09/2021  - {ACTIONS;DENIES/REPORTS:[id number]::"Denies"}: Chest pain, shortness of breath, myalgias, claudication  Hypertension:  Currently managed on triamterene-hydrochlorothiazide, losartan, Torpol XL Rarely demedex for swelling Pt reports good med compliance and denies any SE.   Blood pressure today is well controlled. BP Readings from Last 3 Encounters:  06/08/23 132/68  02/25/23 132/76  01/07/23 116/74   Pt denies CP, SOB, exertional sx, LE edema, palpitation, Ha's, visual disturbances, lightheadedness, hypotension, syncope. Dietary efforts for BP?  ***    CKD stage 3b Last labs 11/2022 38    On allopurinol  Lab Results  Component Value Date   LABURIC 8.8 (H) 11/11/2022     Lab Results  Component Value Date   HGBA1C 6.1 (H) 11/11/2022    Lab Results  Component Value Date   VITAMINB12 828 05/12/2018      Current Outpatient Medications:    acetaminophen (TYLENOL) 325 MG tablet, Take 650 mg by mouth  every 6 (six) hours as needed for mild pain or headache. , Disp: , Rfl:    allopurinol (ZYLOPRIM) 100 MG tablet, TAKE 2 TABLETS BY MOUTH EVERY DAY, Disp: 60 tablet, Rfl: 0   Ascorbic Acid (VITAMIN C) 1000 MG tablet, Take 1,000 mg by mouth in the morning., Disp: , Rfl:    calcium carbonate (OS-CAL) 600 MG TABS tablet, Take 600 mg by mouth daily with breakfast., Disp: , Rfl:    Cholecalciferol (VITAMIN D3) 50 MCG (2000 UT) TABS, Take 2,000 Units by mouth daily., Disp: , Rfl:    Cyanocobalamin (VITAMIN B-12) 500 MCG SUBL, Place 500 mcg under the tongue daily., Disp: , Rfl:    ezetimibe (ZETIA) 10 MG tablet, Take 1 tablet (10 mg total) by mouth daily., Disp: 90 tablet, Rfl: 3   fluticasone (FLONASE) 50 MCG/ACT nasal spray, Place 2 sprays into both nostrils daily., Disp: 16 g, Rfl: 6   latanoprost (XALATAN) 0.005 % ophthalmic solution, Place 1 drop into both eyes at bedtime., Disp: , Rfl:    loratadine-pseudoephedrine (CLARITIN-D 24-HOUR) 10-240 MG 24 hr tablet, Take 1 tablet by mouth daily as needed for allergies., Disp: 30 tablet, Rfl: 1   losartan (COZAAR) 25 MG tablet, TAKE 1 TABLET (25 MG TOTAL) BY MOUTH DAILY., Disp: 30 tablet, Rfl: 0   metoprolol succinate (TOPROL-XL) 25 MG 24 hr tablet, Take 1 tablet (25 mg total) by mouth  daily., Disp: 90 tablet, Rfl: 3   omeprazole (PRILOSEC) 40 MG capsule, Take 1 capsule (40 mg total) by mouth daily., Disp: 90 capsule, Rfl: 0   rosuvastatin (CRESTOR) 5 MG tablet, TAKE 1 TABLET (5 MG TOTAL) BY MOUTH DAILY., Disp: 30 tablet, Rfl: 0   torsemide (DEMADEX) 10 MG tablet, Take by mouth., Disp: , Rfl:    triamterene-hydrochlorothiazide (MAXZIDE-25) 37.5-25 MG tablet, Take 1 tablet by mouth daily., Disp: 90 tablet, Rfl: 1  Patient Active Problem List   Diagnosis Date Noted   Acute gout involving toe of right foot 01/22/2022   History of ductal carcinoma in situ (DCIS) of breast 10/15/2021   S/P hip replacement 10/11/2020   Hyperuricemia 08/25/2019   Anemia in  chronic kidney disease 05/25/2019   Benign hypertensive kidney disease with chronic kidney disease 05/25/2019   Vitamin B12 deficiency 05/07/2017   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   Arthralgia 10/04/2016   Pre-diabetes 06/20/2016   Chronic kidney disease, stage III (moderate) (HCC) 11/15/2015   Vitamin D deficiency 10/29/2015   HLD (hyperlipidemia) 06/15/2015   Class 1 obesity with serious comorbidity and body mass index (BMI) of 32.0 to 32.9 in adult 06/15/2015   Chronic left shoulder pain 11/08/2014   Benign essential HTN 12/07/2006    Past Surgical History:  Procedure Laterality Date   ABDOMINAL HYSTERECTOMY     BREAST BIOPSY Right 01/15/2016    cylinder shape calcs UOQ, FIBROADENOMATOUS   BREAST BIOPSY Right 12/30/2016   stereo bx for calcifications: DCIS UOQ, ribbon shaped   BREAST BIOPSY Right 01/08/2017   LOQ x shaped, FIBROADENOMATOUS CHANGE WITH   BREAST BIOPSY Left 03/09/2019   calcs bx, x marker neg   BREAST LUMPECTOMY Right 01/30/2018   DCIS clear margins   BREAST LUMPECTOMY WITH NEEDLE LOCALIZATION Right 01/30/2017   Procedure: BREAST LUMPECTOMY WITH NEEDLE LOCALIZATION;  Surgeon: Ancil Linsey, MD;  Location: [facility] ORS;  Service: General;  Laterality: Right;   COLONOSCOPY WITH PROPOFOL N/A 11/19/2016   Procedure: COLONOSCOPY WITH PROPOFOL;  Surgeon: Wyline Mood, MD;  Location: [facility];  Service: Endoscopy;  Laterality: N/A;   COLONOSCOPY WITH PROPOFOL N/A 08/28/2021   Procedure: COLONOSCOPY WITH PROPOFOL;  Surgeon: Wyline Mood, MD;  Location: [facility] ENDOSCOPY;  Service: Gastroenterology;  Laterality: N/A;   ESOPHAGOGASTRODUODENOSCOPY N/A 08/28/2021   Procedure: ESOPHAGOGASTRODUODENOSCOPY (EGD);  Surgeon: Wyline Mood, MD;  Location: [facility] ENDOSCOPY;  Service: Gastroenterology;  Laterality: N/A;   ESOPHAGOGASTRODUODENOSCOPY (EGD) WITH PROPOFOL N/A 11/19/2016   Procedure: ESOPHAGOGASTRODUODENOSCOPY (EGD) WITH PROPOFOL;  Surgeon: Wyline Mood, MD;  Location: [facility];  Service: Endoscopy;  Laterality: N/A;   GIVENS CAPSULE STUDY N/A 12/12/2016   Procedure: GIVENS CAPSULE STUDY;  Surgeon: Wyline Mood, MD;  Location: [facility] ENDOSCOPY;  Service: Gastroenterology;  Laterality: N/A;   TOTAL HIP ARTHROPLASTY Right 10/11/2020   Procedure: TOTAL HIP ARTHROPLASTY ANTERIOR APPROACH;  Surgeon: Kennedy Bucker, MD;  Location: [facility] ORS;  Service: Orthopedics;  Laterality: Right;   TOTAL MASTECTOMY Right 01/30/2017   Error    Family History  Problem Relation Age of Onset   Cancer Mother 58       liver cancer   Diabetes Mother    Hypertension Mother    Glomerulonephritis Father    Diabetes Sister    Glomerulonephritis Sister    Cancer Sister 70       throat cancer   Heart disease Sister    Diabetes Sister    Diabetes Sister    COPD Sister  Diabetes Sister    Breast cancer Cousin        Currently undergoing systemic chemotherapy (01/2017)   Varicose Veins Neg Hx     Social History   Tobacco Use   Smoking status: Never   Smokeless tobacco: Never  Vaping Use   Vaping status: Never Used  Substance Use Topics   Alcohol use: No    Alcohol/week: 0.0 standard drinks of alcohol   Drug use: No     Allergies  Allergen Reactions   Penicillins Anaphylaxis   Shellfish Allergy Swelling    angioedema    Health Maintenance  Topic Date Due   COVID-19 Vaccine (3 - Moderna risk series) 08/13/2019   INFLUENZA VACCINE  08/03/2023 (Originally 12/04/2022)   Zoster Vaccines- Shingrix (1 of 2) 09/05/2023 (Originally 10/31/1967)   DEXA SCAN  06/12/2023   DTaP/Tdap/Td (2 - Td or Tdap) 08/03/2023   Medicare Annual Wellness (AWV)  01/07/2024   MAMMOGRAM  03/22/2024   Colonoscopy  08/29/2031   Pneumonia Vaccine 48+ Years old  Completed   Hepatitis C Screening  Completed   HPV VACCINES  Aged Out    Chart Review Today: ***  Review of Systems   Objective:   Vitals:   06/08/23 1424  BP: 132/68  Pulse: 90  Resp: 16   SpO2: 96%  Weight: 184 lb (83.5 kg)  Height: 5\' 1"  (1.549 m)    Body mass index is 34.77 kg/m.  Physical Exam   Functional Status Survey:   Results for orders placed or performed in visit on 11/11/22  COMPLETE METABOLIC PANEL WITH GFR   Collection Time: 11/11/22 11:17 AM  Result Value Ref Range   Glucose, Bld 91 65 - 99 mg/dL   BUN 24 7 - 25 mg/dL   Creat 6.64 (H) 4.03 - 1.00 mg/dL   eGFR 38 (L) > OR = 60 mL/min/1.54m2   BUN/Creatinine Ratio 16 6 - 22 (calc)   Sodium 140 135 - 146 mmol/L   Potassium 4.1 3.5 - 5.3 mmol/L   Chloride 101 98 - 110 mmol/L   CO2 31 20 - 32 mmol/L   Calcium 10.0 8.6 - 10.4 mg/dL   Total Protein 7.1 6.1 - 8.1 g/dL   Albumin 4.1 3.6 - 5.1 g/dL   Globulin 3.0 1.9 - 3.7 g/dL (calc)   AG Ratio 1.4 1.0 - 2.5 (calc)   Total Bilirubin 0.3 0.2 - 1.2 mg/dL   Alkaline phosphatase (APISO) 104 37 - 153 U/L   AST 18 10 - 35 U/L   ALT 18 6 - 29 U/L  Lipid panel   Collection Time: 11/11/22 11:17 AM  Result Value Ref Range   Cholesterol 250 (H) <200 mg/dL   HDL 79 > OR = 50 mg/dL   Triglycerides 474 <259 mg/dL   LDL Cholesterol (Calc) 149 (H) mg/dL (calc)   Total CHOL/HDL Ratio 3.2 <5.0 (calc)   Non-HDL Cholesterol (Calc) 171 (H) <130 mg/dL (calc)  Hemoglobin D6L   Collection Time: 11/11/22 11:17 AM  Result Value Ref Range   Hgb A1c MFr Bld 6.1 (H) <5.7 % of total Hgb   Mean Plasma Glucose 128 mg/dL   eAG (mmol/L) 7.1 mmol/L  CBC with Differential/Platelet   Collection Time: 11/11/22 11:17 AM  Result Value Ref Range   WBC 5.5 3.8 - 10.8 Thousand/uL   RBC 3.97 3.80 - 5.10 Million/uL   Hemoglobin 12.7 11.7 - 15.5 g/dL   HCT 87.5 64.3 - 32.9 %   MCV 95.7 80.0 -  100.0 fL   MCH 32.0 27.0 - 33.0 pg   MCHC 33.4 32.0 - 36.0 g/dL   RDW 16.1 09.6 - 04.5 %   Platelets 235 140 - 400 Thousand/uL   MPV 11.2 7.5 - 12.5 fL   Neutro Abs 3,025 1,500 - 7,800 cells/uL   Lymphs Abs 1,766 850 - 3,900 cells/uL   Absolute Monocytes 572 200 - 950 cells/uL    Eosinophils Absolute 88 15 - 500 cells/uL   Basophils Absolute 50 0 - 200 cells/uL   Neutrophils Relative % 55 %   Total Lymphocyte 32.1 %   Monocytes Relative 10.4 %   Eosinophils Relative 1.6 %   Basophils Relative 0.9 %  Uric acid   Collection Time: 11/11/22 11:17 AM  Result Value Ref Range   Uric Acid, Serum 8.8 (H) 2.5 - 7.0 mg/dL      Assessment & Plan:   Benign essential HTN -     COMPLETE METABOLIC PANEL WITH GFR  Hyperlipidemia, unspecified hyperlipidemia type -     COMPLETE METABOLIC PANEL WITH GFR -     Lipid panel  Pre-diabetes -     COMPLETE METABOLIC PANEL WITH GFR  Class 1 obesity with serious comorbidity and body mass index (BMI) of 33.0 to 33.9 in adult, unspecified obesity type  Stage 3b chronic kidney disease (HCC) -     Uric acid -     Microalbumin / creatinine urine ratio -     CBC with Differential/Platelet -     COMPLETE METABOLIC PANEL WITH GFR -     Parathyroid hormone, intact (no Ca) -     VITAMIN D 25 Hydroxy (Vit-D Deficiency, Fractures)  Gastroesophageal reflux disease, unspecified whether esophagitis present -     Omeprazole; Take 1 capsule (40 mg total) by mouth daily.  Dispense: 90 capsule; Refill: 1  Vitamin D deficiency -     VITAMIN D 25 Hydroxy (Vit-D Deficiency, Fractures)  Vitamin B12 deficiency -     CBC with Differential/Platelet -     Vitamin B12  Hyperuricemia -     Uric acid       No follow-ups on file.   Danelle Berry, PA-C 06/08/23 2:35 PM

## 2023-06-08 NOTE — Patient Instructions (Signed)
Health Maintenance  Topic Date Due   COVID-19 Vaccine (3 - Moderna risk series) 08/13/2019   Flu Shot  08/03/2023*   Zoster (Shingles) Vaccine (1 of 2) 09/05/2023*   DEXA scan (bone density measurement)  06/12/2023   DTaP/Tdap/Td vaccine (2 - Td or Tdap) 08/03/2023   Medicare Annual Wellness Visit  01/07/2024   Mammogram  03/22/2024   Colon Cancer Screening  08/29/2031   Pneumonia Vaccine  Completed   Hepatitis C Screening  Completed   HPV Vaccine  Aged Out  *Topic was postponed. The date shown is not the original due date.   I have ordered your next mammogram which is due in November

## 2023-06-09 ENCOUNTER — Other Ambulatory Visit: Payer: Self-pay | Admitting: Family Medicine

## 2023-06-09 DIAGNOSIS — I10 Essential (primary) hypertension: Secondary | ICD-10-CM

## 2023-06-09 DIAGNOSIS — E782 Mixed hyperlipidemia: Secondary | ICD-10-CM

## 2023-06-09 DIAGNOSIS — E785 Hyperlipidemia, unspecified: Secondary | ICD-10-CM

## 2023-06-09 DIAGNOSIS — E790 Hyperuricemia without signs of inflammatory arthritis and tophaceous disease: Secondary | ICD-10-CM

## 2023-06-09 LAB — CBC WITH DIFFERENTIAL/PLATELET
Absolute Lymphocytes: 2184 {cells}/uL (ref 850–3900)
Absolute Monocytes: 421 {cells}/uL (ref 200–950)
Basophils Absolute: 52 {cells}/uL (ref 0–200)
Basophils Relative: 1 %
Eosinophils Absolute: 120 {cells}/uL (ref 15–500)
Eosinophils Relative: 2.3 %
HCT: 35.3 % (ref 35.0–45.0)
Hemoglobin: 11.4 g/dL — ABNORMAL LOW (ref 11.7–15.5)
MCH: 30.9 pg (ref 27.0–33.0)
MCHC: 32.3 g/dL (ref 32.0–36.0)
MCV: 95.7 fL (ref 80.0–100.0)
MPV: 11.3 fL (ref 7.5–12.5)
Monocytes Relative: 8.1 %
Neutro Abs: 2423 {cells}/uL (ref 1500–7800)
Neutrophils Relative %: 46.6 %
Platelets: 244 10*3/uL (ref 140–400)
RBC: 3.69 10*6/uL — ABNORMAL LOW (ref 3.80–5.10)
RDW: 12.8 % (ref 11.0–15.0)
Total Lymphocyte: 42 %
WBC: 5.2 10*3/uL (ref 3.8–10.8)

## 2023-06-09 LAB — MICROALBUMIN / CREATININE URINE RATIO
Creatinine, Urine: 31 mg/dL (ref 20–275)
Microalb Creat Ratio: 23 mg/g{creat} (ref <30)
Microalb, Ur: 0.7 mg/dL

## 2023-06-09 LAB — URIC ACID: Uric Acid, Serum: 7.3 mg/dL — ABNORMAL HIGH (ref 2.5–7.0)

## 2023-06-09 LAB — LIPID PANEL
Cholesterol: 215 mg/dL — ABNORMAL HIGH (ref <200)
HDL: 61 mg/dL (ref >=50)
LDL Cholesterol (Calc): 129 mg/dL — ABNORMAL HIGH
Non-HDL Cholesterol (Calc): 154 mg/dL — ABNORMAL HIGH (ref <130)
Total CHOL/HDL Ratio: 3.5 (calc) (ref <5.0)
Triglycerides: 136 mg/dL (ref <150)

## 2023-06-09 LAB — COMPLETE METABOLIC PANEL WITH GFR
AG Ratio: 1.5 (calc) (ref 1.0–2.5)
ALT: 16 U/L (ref 6–29)
AST: 17 U/L (ref 10–35)
Albumin: 4.1 g/dL (ref 3.6–5.1)
Alkaline phosphatase (APISO): 92 U/L (ref 37–153)
BUN/Creatinine Ratio: 20 (calc) (ref 6–22)
BUN: 26 mg/dL — ABNORMAL HIGH (ref 7–25)
CO2: 29 mmol/L (ref 20–32)
Calcium: 9.9 mg/dL (ref 8.6–10.4)
Chloride: 103 mmol/L (ref 98–110)
Creat: 1.33 mg/dL — ABNORMAL HIGH (ref 0.60–1.00)
Globulin: 2.8 g/dL (ref 1.9–3.7)
Glucose, Bld: 81 mg/dL (ref 65–99)
Potassium: 4.4 mmol/L (ref 3.5–5.3)
Sodium: 139 mmol/L (ref 135–146)
Total Bilirubin: 0.3 mg/dL (ref 0.2–1.2)
Total Protein: 6.9 g/dL (ref 6.1–8.1)
eGFR: 42 mL/min/{1.73_m2} — ABNORMAL LOW (ref >=60)

## 2023-06-09 LAB — VITAMIN D 25 HYDROXY (VIT D DEFICIENCY, FRACTURES): Vit D, 25-Hydroxy: 35 ng/mL (ref 30–100)

## 2023-06-09 LAB — PARATHYROID HORMONE, INTACT (NO CA): PTH: 53 pg/mL (ref 16–77)

## 2023-06-09 LAB — VITAMIN B12: Vitamin B-12: 686 pg/mL (ref 200–1100)

## 2023-06-09 MED ORDER — EZETIMIBE 10 MG PO TABS: 10.0000 mg | ORAL_TABLET | Freq: Every day | ORAL | 3 refills | Status: DC

## 2023-06-09 MED ORDER — ROSUVASTATIN CALCIUM 5 MG PO TABS: 5.0000 mg | ORAL_TABLET | ORAL | 3 refills | Status: DC

## 2023-06-09 MED ORDER — METOPROLOL SUCCINATE ER 25 MG PO TB24: 25.0000 mg | ORAL_TABLET | Freq: Every day | ORAL | 1 refills | Status: DC

## 2023-06-09 MED ORDER — TRIAMTERENE-HCTZ 37.5-25 MG PO TABS: 1.0000 | ORAL_TABLET | Freq: Every day | ORAL | 1 refills | Status: DC

## 2023-06-09 MED ORDER — ALLOPURINOL 100 MG PO TABS: 200.0000 mg | ORAL_TABLET | Freq: Every day | ORAL | 1 refills | Status: DC

## 2023-06-12 ENCOUNTER — Encounter: Payer: Self-pay | Admitting: Family Medicine

## 2023-06-12 DIAGNOSIS — K219 Gastro-esophageal reflux disease without esophagitis: Secondary | ICD-10-CM | POA: Insufficient documentation

## 2023-06-12 NOTE — Assessment & Plan Note (Signed)
 Not on medications, recheck A1c

## 2023-06-12 NOTE — Assessment & Plan Note (Signed)
 Managed on allopurinol  200 mg daily, recheck uric acid last was still elevated -July 2024 8.8 No recent gout flares

## 2023-06-12 NOTE — Assessment & Plan Note (Signed)
 On supplements, recheck vitamin D  levels to see if supplementation is adequate

## 2023-06-12 NOTE — Assessment & Plan Note (Signed)
 With statin sensitivity she cannot take it daily and has been taking the statin every other day or about 3 times per week We added Zetia  but she is also only taking this about 3 times a week Due for repeat lipids last were done a year ago and were very high

## 2023-06-12 NOTE — Assessment & Plan Note (Signed)
 Blood pressure near goal today Good compliance with triamterene  hydrochlorothiazide , losartan  and Toprol -XL Will recheck labs Continue meds, no change

## 2023-06-12 NOTE — Assessment & Plan Note (Signed)
 Symptoms are currently well-controlled with over-the-counter medications -omeprazole 

## 2023-06-12 NOTE — Assessment & Plan Note (Signed)
 She is established with nephrology, most recent labs and renal function reviewed

## 2023-06-12 NOTE — Assessment & Plan Note (Addendum)
 Monitoring labs Last CBC reviewed through outside labs with nephrology

## 2023-06-12 NOTE — Assessment & Plan Note (Signed)
 Last B12 level was normal

## 2023-06-14 ENCOUNTER — Other Ambulatory Visit: Payer: Self-pay | Admitting: Family Medicine

## 2023-06-14 DIAGNOSIS — E785 Hyperlipidemia, unspecified: Secondary | ICD-10-CM

## 2023-06-14 DIAGNOSIS — I10 Essential (primary) hypertension: Secondary | ICD-10-CM

## 2023-06-15 NOTE — Telephone Encounter (Signed)
 Requested Prescriptions  Refused Prescriptions Disp Refills   losartan  (COZAAR ) 25 MG tablet [Pharmacy Med Name: LOSARTAN  POTASSIUM 25 MG TAB] 30 tablet 0    Sig: TAKE 1 TABLET (25 MG TOTAL) BY MOUTH DAILY.     Cardiovascular:  Angiotensin Receptor Blockers Failed - 06/15/2023  4:31 PM      Failed - Cr in normal range and within 180 days    Creat  Date Value Ref Range Status  06/08/2023 1.33 (H) 0.60 - 1.00 mg/dL Final   Creatinine, Urine  Date Value Ref Range Status  06/08/2023 31 20 - 275 mg/dL Final         Passed - K in normal range and within 180 days    Potassium  Date Value Ref Range Status  06/08/2023 4.4 3.5 - 5.3 mmol/L Final         Passed - Patient is not pregnant      Passed - Last BP in normal range    BP Readings from Last 1 Encounters:  06/08/23 132/68         Passed - Valid encounter within last 6 months    Recent Outpatient Visits           3 months ago Rhinosinusitis   [facility] Adeline Hone, PA-C   5 months ago Adult general medical exam   [facility] Adeline Hone, PA-C   7 months ago Benign essential HTN   [facility] Adeline Hone, PA-C   1 year ago Benign essential HTN   [facility] Adeline Hone, PA-C   1 year ago Hyperuricemia   [facility] Adeline Hone, PA-C       Future Appointments             In 5 months Adeline Hone, PA-C [facility]             rosuvastatin  (CRESTOR ) 5 MG tablet [Pharmacy Med Name: ROSUVASTATIN  CALCIUM  5 MG TAB] 30 tablet     Sig: TAKE 1 TABLET (5 MG TOTAL) BY MOUTH DAILY.     Cardiovascular:  Antilipid - Statins 2 Failed - 06/15/2023  4:31 PM      Failed - Cr in normal range and within 360 days    Creat  Date Value Ref Range Status  06/08/2023 1.33 (H) 0.60 - 1.00 mg/dL Final   Creatinine, Urine  Date Value Ref Range Status  06/08/2023 31  20 - 275 mg/dL Final         Failed - Lipid Panel in normal range within the last 12 months    Cholesterol, Total  Date Value Ref Range Status  02/21/2015 214 (H) 100 - 199 mg/dL Final   Cholesterol  Date Value Ref Range Status  06/08/2023 215 (H) <200 mg/dL Final   LDL Cholesterol (Calc)  Date Value Ref Range Status  06/08/2023 129 (H) mg/dL (calc) Final    Comment:    Reference range: <100 . Desirable range <100 mg/dL for primary prevention;   <70 mg/dL for patients with CHD or diabetic patients  with > or = 2 CHD risk factors. Aaron Aas LDL-C is now calculated using the Martin-Hopkins  calculation, which is a validated novel method providing  better accuracy than the Friedewald equation in the  estimation of LDL-C.  Melinda Sprawls et al. Erroll Heard. 9562;130(86): 2061-2068  (http://education.[employer].com/faq/FAQ164)    HDL  Date Value Ref Range Status  06/08/2023 61 > OR = 50 mg/dL Final  40/98/1191 69 >47 mg/dL Final    Comment:    According to ATP-III Guidelines, HDL-C >59 mg/dL is considered a negative risk factor for CHD.    Triglycerides  Date Value Ref Range Status  06/08/2023 136 <150 mg/dL Final         Passed - Patient is not pregnant      Passed - Valid encounter within last 12 months    Recent Outpatient Visits           3 months ago Rhinosinusitis   [facility] Adeline Hone, PA-C   5 months ago Adult general medical exam   [facility] Adeline Hone, PA-C   7 months ago Benign essential HTN   [facility] Adeline Hone, PA-C   1 year ago Benign essential HTN   [facility] Adeline Hone, PA-C   1 year ago Hyperuricemia   [facility] Adeline Hone, PA-C       Future Appointments             In 5 months Adeline Hone, PA-C [facility]

## 2023-06-25 ENCOUNTER — Other Ambulatory Visit: Payer: Self-pay | Admitting: Family Medicine

## 2023-06-25 DIAGNOSIS — I10 Essential (primary) hypertension: Secondary | ICD-10-CM

## 2023-09-03 ENCOUNTER — Encounter: Payer: Self-pay | Admitting: Internal Medicine

## 2023-09-03 ENCOUNTER — Ambulatory Visit: Admitting: Internal Medicine

## 2023-09-03 VITALS — BP 128/76 | HR 95 | Temp 98.2°F | Resp 16 | Ht 61.0 in | Wt 184.8 lb

## 2023-09-03 DIAGNOSIS — S39012A Strain of muscle, fascia and tendon of lower back, initial encounter: Secondary | ICD-10-CM

## 2023-09-03 MED ORDER — TIZANIDINE HCL 4 MG PO TABS: 4.0000 mg | ORAL_TABLET | Freq: Every evening | ORAL | 0 refills | Status: DC | PRN

## 2023-09-03 MED ORDER — METHYLPREDNISOLONE 4 MG PO TBPK: ORAL_TABLET | ORAL | 0 refills | Status: DC

## 2023-09-03 NOTE — Patient Instructions (Signed)
 It was great seeing you today!  Plan discussed at today's visit: -Treat lumbar strain with steroid pack and muscle relaxer as needed at bedtime -Can also use moist heat, heating pad and topical medications like Bengay, IcyHot, Voltaren gel, etc.  -Also can try gentle massage and gentle stretches below once feeling better  Follow up in: as needed  Take care and let us  know if you have any questions or concerns prior to your next visit.  Dr. Bud Care  Low Back Sprain or Strain Rehab Ask your health care provider which exercises are safe for you. Do exercises exactly as told by your health care provider and adjust them as directed. It is normal to feel mild stretching, pulling, tightness, or discomfort as you do these exercises. Stop right away if you feel sudden pain or your pain gets worse. Do not begin these exercises until told by your health care provider. Stretching and range-of-motion exercises These exercises warm up your muscles and joints and improve the movement and flexibility of your back. These exercises also help to relieve pain, numbness, and tingling. Lumbar rotation  Lie on your back on a firm bed or the floor with your knees bent. Straighten your arms out to your sides so each arm forms a 90-degree angle (right angle) with a side of your body. Slowly move (rotate) both of your knees to one side of your body until you feel a stretch in your lower back (lumbar). Try not to let your shoulders lift off the floor. Hold this position for __________ seconds. Tense your abdominal muscles and slowly move your knees back to the starting position. Repeat this exercise on the other side of your body. Repeat __________ times. Complete this exercise __________ times a day. Single knee to chest  Lie on your back on a firm bed or the floor with both legs straight. Bend one of your knees. Use your hands to move your knee up toward your chest until you feel a gentle stretch in your lower  back and buttock. Hold your leg in this position by holding on to the front of your knee. Keep your other leg as straight as possible. Hold this position for __________ seconds. Slowly return to the starting position. Repeat with your other leg. Repeat __________ times. Complete this exercise __________ times a day. Prone extension on elbows  Lie on your abdomen on a firm bed or the floor (prone position). Prop yourself up on your elbows. Use your arms to help lift your chest up until you feel a gentle stretch in your abdomen and your lower back. This will place some of your body weight on your elbows. If this is uncomfortable, try stacking pillows under your chest. Your hips should stay down, against the surface that you are lying on. Keep your hip and back muscles relaxed. Hold this position for __________ seconds. Slowly relax your upper body and return to the starting position. Repeat __________ times. Complete this exercise __________ times a day. Strengthening exercises These exercises build strength and endurance in your back. Endurance is the ability to use your muscles for a long time, even after they get tired. Pelvic tilt This exercise strengthens the muscles that lie deep in the abdomen. Lie on your back on a firm bed or the floor with your legs extended. Bend your knees so they are pointing toward the ceiling and your feet are flat on the floor. Tighten your lower abdominal muscles to press your lower back against the floor.  This motion will tilt your pelvis so your tailbone points up toward the ceiling instead of pointing to your feet or the floor. To help with this exercise, you may place a small towel under your lower back and try to push your back into the towel. Hold this position for __________ seconds. Let your muscles relax completely before you repeat this exercise. Repeat __________ times. Complete this exercise __________ times a day. Alternating arm and leg  raises  Get on your hands and knees on a firm surface. If you are on a hard floor, you may want to use padding, such as an exercise mat, to cushion your knees. Line up your arms and legs. Your hands should be directly below your shoulders, and your knees should be directly below your hips. Lift your left leg behind you. At the same time, raise your right arm and straighten it in front of you. Do not lift your leg higher than your hip. Do not lift your arm higher than your shoulder. Keep your abdominal and back muscles tight. Keep your hips facing the ground. Do not arch your back. Keep your balance carefully, and do not hold your breath. Hold this position for __________ seconds. Slowly return to the starting position. Repeat with your right leg and your left arm. Repeat __________ times. Complete this exercise __________ times a day. Abdominal set with straight leg raise  Lie on your back on a firm bed or the floor. Bend one of your knees and keep your other leg straight. Tense your abdominal muscles and lift your straight leg up, 4-6 inches (10-15 cm) off the ground. Keep your abdominal muscles tight and hold this position for __________ seconds. Do not hold your breath. Do not arch your back. Keep it flat against the ground. Keep your abdominal muscles tense as you slowly lower your leg back to the starting position. Repeat with your other leg. Repeat __________ times. Complete this exercise __________ times a day. Single leg lower with bent knees Lie on your back on a firm bed or the floor. Tense your abdominal muscles and lift your feet off the floor, one foot at a time, so your knees and hips are bent in 90-degree angles (right angles). Your knees should be over your hips and your lower legs should be parallel to the floor. Keeping your abdominal muscles tense and your knee bent, slowly lower one of your legs so your toe touches the ground. Lift your leg back up to return to the  starting position. Do not hold your breath. Do not let your back arch. Keep your back flat against the ground. Repeat with your other leg. Repeat __________ times. Complete this exercise __________ times a day. Posture and body mechanics Good posture and healthy body mechanics can help to relieve stress in your body's tissues and joints. Body mechanics refers to the movements and positions of your body while you do your daily activities. Posture is part of body mechanics. Good posture means: Your spine is in its natural S-curve position (neutral). Your shoulders are pulled back slightly. Your head is not tipped forward (neutral). Follow these guidelines to improve your posture and body mechanics in your everyday activities. Standing  When standing, keep your spine neutral and your feet about hip-width apart. Keep a slight bend in your knees. Your ears, shoulders, and hips should line up. When you do a task in which you stand in one place for a long time, place one foot up on a  stable object that is 2-4 inches (5-10 cm) high, such as a footstool. This helps keep your spine neutral. Sitting  When sitting, keep your spine neutral and keep your feet flat on the floor. Use a footrest, if necessary, and keep your thighs parallel to the floor. Avoid rounding your shoulders, and avoid tilting your head forward. When working at a desk or a computer, keep your desk at a height where your hands are slightly lower than your elbows. Slide your chair under your desk so you are close enough to maintain good posture. When working at a computer, place your monitor at a height where you are looking straight ahead and you do not have to tilt your head forward or downward to look at the screen. Resting When lying down and resting, avoid positions that are most painful for you. If you have pain with activities such as sitting, bending, stooping, or squatting, lie in a position in which your body does not bend very  much. For example, avoid curling up on your side with your arms and knees near your chest (fetal position). If you have pain with activities such as standing for a long time or reaching with your arms, lie with your spine in a neutral position and bend your knees slightly. Try the following positions: Lying on your side with a pillow between your knees. Lying on your back with a pillow under your knees. Lifting  When lifting objects, keep your feet at least shoulder-width apart and tighten your abdominal muscles. Bend your knees and hips and keep your spine neutral. It is important to lift using the strength of your legs, not your back. Do not lock your knees straight out. Always ask for help to lift heavy or awkward objects. This information is not intended to replace advice given to you by your health care provider. Make sure you discuss any questions you have with your health care provider. Document Revised: 08/25/2022 Document Reviewed: 07/09/2020 [employer] Patient Education  2024 [employer].

## 2023-09-03 NOTE — Progress Notes (Signed)
 Acute Office Visit  Subjective:     Patient ID: JAMILEX HAGELSTEIN, female    DOB: June 23, 1950, 73 y.o.   MRN: [id number]  Chief Complaint  Patient presents with   Back Pain   Hip Pain    R HIP SPASMS ON GOING FOR A WEEK    Back Pain Pertinent negatives include no dysuria or fever.  Hip Pain    Patient is in today for back and right hip pain.  Discussed the use of AI scribe software for clinical note transcription with the patient, who gave verbal consent to proceed.  History of Present Illness KADEDRA SCIARRA is a 73 year old female with kidney problems who presents with back and hip pain.  She experiences severe, debilitating pain in the lower back radiating to the hip, present for one week, without recent trauma. The pain does not extend to the groin or stomach. 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 Concerns about kidney problems are noted, but there are no urinary symptoms such as burning, increased frequency, or hematuria. Tylenol  is used for pain relief, but it is ineffective. Other medications are avoided due to kidney issues. Lab tests show improved GFR in February. She denies changes in urine color or consistency, fever, or systemic symptoms.       Review of Systems  Constitutional:  Negative for chills and fever.  Genitourinary:  Negative for dysuria, frequency, hematuria and urgency.  Musculoskeletal:  Positive for back pain.        Objective:    BP 128/76   Pulse 95   Temp 98.2 F (36.8 C) (Oral)   Resp 16   Ht 5\' 1"  (1.549 m)   Wt 184 lb 12.8 oz (83.8 kg)   SpO2 96%   BMI 34.92 kg/m    Physical Exam Constitutional:      Appearance: Normal appearance.  HENT:     Head: Normocephalic and atraumatic.  Eyes:     Conjunctiva/sclera: Conjunctivae normal.  Cardiovascular:     Rate and Rhythm: Normal rate and regular rhythm.  Pulmonary:     Effort: Pulmonary effort is normal.     Breath sounds: Normal breath sounds.   Musculoskeletal:        General: Tenderness present.     Lumbar back: Spasms and tenderness present.     Comments: Spasms in the right lower paraspinal musculature   Skin:    General: Skin is warm and dry.  Neurological:     General: No focal deficit present.     Mental Status: She is alert. Mental status is at baseline.  Psychiatric:        Mood and Affect: Mood normal.        Behavior: Behavior normal.     No results found for any visits on 09/03/23.      Assessment & Plan:   Assessment & Plan Muscle spasms in lower back Acute muscle spasms likely due to strain. Severe pain radiating to hip. NSAIDs contraindicated due to chronic kidney disease. Steroids considered for inflammation with potential temporary blood sugar elevation. Topical treatments and muscle relaxers as alternatives. - Prescribed Medrol  dose pack. - Prescribed muscle relaxer at bedtime. - Advised moist heat application. - Recommended topical medications like Icy Hot or Voltaren gel. - Provided low back stretches for later use. - Advised against heavy lifting, pulling, or pushing. - Instructed to use leg muscles when standing or lifting. - Advised to call if no improvement or  side effects occur.  - tiZANidine  (ZANAFLEX ) 4 MG tablet; Take 1 tablet (4 mg total) by mouth at bedtime as needed for muscle spasms.  Dispense: 30 tablet; Refill: 0 - methylPREDNISolone  (MEDROL  DOSEPAK) 4 MG TBPK tablet; Use as directed.  Dispense: 21 each; Refill: 0    Return if symptoms worsen or fail to improve.  Rockney Cid, DO

## 2023-09-09 ENCOUNTER — Encounter: Payer: Self-pay | Admitting: Oncology

## 2023-09-09 ENCOUNTER — Inpatient Hospital Stay: Payer: BC Managed Care – PPO | Attending: Oncology | Admitting: Oncology

## 2023-09-09 VITALS — BP 113/62 | HR 80 | Temp 97.7°F | Resp 16 | Wt 187.0 lb

## 2023-09-09 DIAGNOSIS — Z803 Family history of malignant neoplasm of breast: Secondary | ICD-10-CM | POA: Diagnosis not present

## 2023-09-09 DIAGNOSIS — Z08 Encounter for follow-up examination after completed treatment for malignant neoplasm: Secondary | ICD-10-CM | POA: Insufficient documentation

## 2023-09-09 DIAGNOSIS — Z86000 Personal history of in-situ neoplasm of breast: Secondary | ICD-10-CM | POA: Diagnosis not present

## 2023-09-09 DIAGNOSIS — Z800 Family history of malignant neoplasm of digestive organs: Secondary | ICD-10-CM | POA: Insufficient documentation

## 2023-09-09 NOTE — Progress Notes (Signed)
Pt in for follow up denies any concerns today.  

## 2023-09-09 NOTE — Progress Notes (Signed)
 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 ER greater than 90% positive and PR was 11-50% positive. Biopsy of the right lower quadrant of the right breast showed fibroadenomatous changes with calcifications.Patient underwent lumpectomy on 01/30/2017 which showed a 15 mm DCIS, grade 2 with comedonecrosis. Margins were -3 mm from the closest medial margin lymph nodes were not submitted. Pathology stage pTisNx. ER greater than 90% positive PR 11-50% positive. Patient completed adjuvant RT and started arimidex  in jan 2019.  She completed 5 years of endocrine therapy in January 2024   Interval history-she is doing well overall and denies any breast concerns at this time.  Appetite and weight have remained stable.  She has chronic back pain which has remained stable.  She is looking forward for her retirement next month  ECOG PS- 1 Pain scale- 2 Opioid associated constipation- no  Review of systems- Review of Systems  Constitutional:  Negative for chills, fever, malaise/fatigue and weight loss.  HENT:  Negative for congestion, ear discharge and nosebleeds.   Eyes:  Negative for blurred vision.  Respiratory:  Negative for cough, hemoptysis, sputum production, shortness of breath and wheezing.   Cardiovascular:  Negative for chest pain, palpitations, orthopnea and  claudication.  Gastrointestinal:  Negative for abdominal pain, blood in stool, constipation, diarrhea, heartburn, melena, nausea and vomiting.  Genitourinary:  Negative for dysuria, flank pain, frequency, hematuria and urgency.  Musculoskeletal:  Positive for back pain. Negative for joint pain and myalgias.  Skin:  Negative for rash.  Neurological:  Negative for dizziness, tingling, focal weakness, seizures, weakness and headaches.  Endo/Heme/Allergies:  Does not bruise/bleed easily.  Psychiatric/Behavioral:  Negative for depression and suicidal ideas. The patient does not have insomnia.       Allergies  Allergen Reactions   Penicillins Anaphylaxis   Shellfish Allergy Swelling    angioedema     Past Medical History:  Diagnosis Date   Abnormal mammogram of right breast 01/17/2016   Recommendation: Six month follow-up diagnostic mammogram of the right breast for an additional group of probably benign Calcifications. 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   RIGHT, August 2018   Family history of diabetes mellitus 06/12/2016   Mother and several other relatives   HLD (hyperlipidemia) 06/15/2015   Hx of right breast biopsy 01/17/2016   Hyperlipidemia    Hypertension    Medication monitoring encounter 11/15/2015   Motion sickness    ocean ships   Numbness and tingling in left hand 11/07/2014   Obesity 10/25/2015   Obesity (BMI 30.0-34.9) 10/25/2015   Personal history of radiation therapy  2018   right breast ca   PONV (postoperative nausea and vomiting)    patient stated she vomits a lot after anesthsia   Positive ANA (antinuclear antibody) 10/04/2016   Prediabetes    Preventative health care  11/15/2015   Vitamin D  deficiency 10/29/2015     Past Surgical History:  Procedure Laterality Date   ABDOMINAL HYSTERECTOMY     BREAST BIOPSY Right 01/15/2016    cylinder shape calcs UOQ, FIBROADENOMATOUS   BREAST BIOPSY Right 12/30/2016   stereo bx for calcifications: DCIS UOQ, ribbon shaped   BREAST BIOPSY Right 01/08/2017   LOQ x shaped, FIBROADENOMATOUS CHANGE WITH   BREAST BIOPSY Left 03/09/2019   calcs bx, x marker neg   BREAST LUMPECTOMY Right 01/30/2018   DCIS clear margins   BREAST LUMPECTOMY WITH NEEDLE LOCALIZATION Right 01/30/2017   Procedure: BREAST LUMPECTOMY WITH NEEDLE LOCALIZATION;  Surgeon: Franki Isles, MD;  Location: [facility];  Service: General;  Laterality: Right;   COLONOSCOPY WITH PROPOFOL  N/A 11/19/2016   Procedure: COLONOSCOPY WITH PROPOFOL ;  Surgeon: Luke Salaam, MD;  Location: [facility];  Service: Endoscopy;  Laterality: N/A;   COLONOSCOPY WITH PROPOFOL  N/A 08/28/2021   Procedure: COLONOSCOPY WITH PROPOFOL ;  Surgeon: Luke Salaam, MD;  Location: [facility];  Service: Gastroenterology;  Laterality: N/A;   ESOPHAGOGASTRODUODENOSCOPY N/A 08/28/2021   Procedure: ESOPHAGOGASTRODUODENOSCOPY (EGD);  Surgeon: Luke Salaam, MD;  Location: [facility] ENDOSCOPY;  Service: Gastroenterology;  Laterality: N/A;   ESOPHAGOGASTRODUODENOSCOPY (EGD) WITH PROPOFOL  N/A 11/19/2016   Procedure: ESOPHAGOGASTRODUODENOSCOPY (EGD) WITH PROPOFOL ;  Surgeon: Luke Salaam, MD;  Location: [facility];  Service: Endoscopy;  Laterality: N/A;   GIVENS CAPSULE STUDY N/A 12/12/2016   Procedure: GIVENS CAPSULE STUDY;  Surgeon: Luke Salaam, MD;  Location: [facility] ENDOSCOPY;  Service: Gastroenterology;  Laterality: N/A;   TOTAL HIP ARTHROPLASTY Right 10/11/2020   Procedure: TOTAL HIP ARTHROPLASTY ANTERIOR APPROACH;  Surgeon: Molli Angelucci, MD;  Location: [facility];  Service: Orthopedics;  Laterality: Right;   TOTAL MASTECTOMY Right 01/30/2017   Error    Social History    Socioeconomic History   Marital status: Married    Spouse name: Omani Abdulaziz   Number of children: 2   Years of education: Not on file   Highest education level: Not on file  Occupational History   Occupation: Works in [place] [employer] [place], [place])    Employer: [employer]  Tobacco Use   Smoking status: Never   Smokeless tobacco: Never  Vaping Use   Vaping status: Never Used  Substance and Sexual Activity   Alcohol use: No    Alcohol/week: 0.0 standard drinks of alcohol   Drug use: No   Sexual activity: Not Currently  Other Topics Concern   Not on file  Social History Narrative   Not on file   Social Drivers of Health   Financial Resource Strain: Low Risk  (01/07/2023)   Overall Financial Resource Strain (CARDIA)    Difficulty of Paying Living Expenses: Not hard at all  Food Insecurity: No Food Insecurity (01/07/2023)   Hunger Vital Sign    Worried About Running Out of Food in the Last Year: Never true    Ran Out of Food in the Last Year: Never true  Transportation Needs: No Transportation Needs (12/10/2021)   PRAPARE - Administrator, Civil Service (Medical): No    Lack of Transportation (Non-Medical): No  Physical Activity: Insufficiently Active (01/07/2023)   Exercise Vital Sign    Days of Exercise per  Week: 2 days    Minutes of Exercise per Session: 20 min  Stress: No Stress Concern Present (01/07/2023)   [employer] of Occupational Health - Occupational Stress Questionnaire    Feeling of Stress : Not at all  Social Connections: Moderately Integrated (01/07/2023)   Social Connection and Isolation Panel [NHANES]    Frequency of Communication with Friends and Family: More than three times a week    Frequency of Social Gatherings with Friends and Family: More than three times a week    Attends Religious Services: More than 4 times per year    Active Member of [employer] or Organizations: No    Attends Banker Meetings: Never     Marital Status: Married  Catering manager Violence: Not At Risk (01/07/2023)   Humiliation, Afraid, Rape, and Kick questionnaire    Fear of Current or Ex-Partner: No    Emotionally Abused: No    Physically Abused: No    Sexually Abused: No    Family History  Problem Relation Age of Onset   Cancer Mother 6       liver cancer   Diabetes Mother    Hypertension Mother    Glomerulonephritis Father    Diabetes Sister    Glomerulonephritis Sister    Cancer Sister 42       throat cancer   Heart disease Sister    Diabetes Sister    Diabetes Sister    COPD Sister    Diabetes Sister    Breast cancer Cousin        Currently undergoing systemic chemotherapy (01/2017)   Varicose Veins Neg Hx      Current Outpatient Medications:    acetaminophen  (TYLENOL ) 325 MG tablet, Take 650 mg by mouth every 6 (six) hours as needed for mild pain or headache. , Disp: , Rfl:    allopurinol  (ZYLOPRIM ) 100 MG tablet, Take 2 tablets (200 mg total) by mouth daily., Disp: 180 tablet, Rfl: 1   Ascorbic Acid  (VITAMIN C) 1000 MG tablet, Take 1,000 mg by mouth in the morning., Disp: , Rfl:    calcium  carbonate (OS-CAL) 600 MG TABS tablet, Take 600 mg by mouth daily with breakfast., Disp: , Rfl:    Cholecalciferol  (VITAMIN D3) 50 MCG (2000 UT) TABS, Take 2,000 Units by mouth daily., Disp: , Rfl:    Cyanocobalamin  (VITAMIN B-12) 500 MCG SUBL, Place 500 mcg under the tongue daily., Disp: , Rfl:    ezetimibe  (ZETIA ) 10 MG tablet, Take 1 tablet (10 mg total) by mouth daily., Disp: 90 tablet, Rfl: 3   fluticasone  (FLONASE ) 50 MCG/ACT nasal spray, Place 2 sprays into both nostrils daily., Disp: 16 g, Rfl: 6   latanoprost  (XALATAN ) 0.005 % ophthalmic solution, Place 1 drop into both eyes at bedtime., Disp: , Rfl:    loratadine -pseudoephedrine  (CLARITIN -D 24-HOUR) 10-240 MG 24 hr tablet, Take 1 tablet by mouth daily as needed for allergies., Disp: 30 tablet, Rfl: 1   metoprolol  succinate (TOPROL -XL) 25 MG 24 hr tablet,  Take 1 tablet (25 mg total) by mouth daily., Disp: 90 tablet, Rfl: 1   rosuvastatin  (CRESTOR ) 5 MG tablet, Take 1 tablet (5 mg total) by mouth every other day., Disp: 45 tablet, Rfl: 3   triamterene -hydrochlorothiazide  (MAXZIDE -25) 37.5-25 MG tablet, Take 1 tablet by mouth daily., Disp: 90 tablet, Rfl: 1   torsemide  (DEMADEX ) 10 MG tablet, Take by mouth. (Patient not taking: Reported on 09/09/2023), Disp: , Rfl:   Physical exam:  Vitals:   09/09/23 1424  BP: 113/62  Pulse: 80  Resp: 16  Temp: 97.7 F (36.5 C)  TempSrc: Tympanic  SpO2: 99%  Weight: 187 lb (84.8 kg)   Physical Exam Cardiovascular:     Rate and Rhythm: Normal rate and regular rhythm.     Heart sounds: Normal heart sounds.  Pulmonary:     Effort: Pulmonary effort is normal.     Breath sounds: Normal breath sounds.  Skin:    General: Skin is warm and dry.  Neurological:     Mental Status: She is alert and oriented to person, place, and time.    Breast exam was performed in seated and lying down position. Patient is status post right lumpectomy with a well-healed surgical scar. No evidence of any palpable masses. No evidence of axillary adenopathy. No evidence of any palpable masses or lumps in the left breast. No evidence of leftt axillary adenopathy  I have personally reviewed labs listed below:    Latest Ref Rng & Units 06/08/2023    3:17 PM  CMP  Glucose 65 - 99 mg/dL 81   BUN 7 - 25 mg/dL 26   Creatinine 7.84 - 1.00 mg/dL 6.96   Sodium 295 - 284 mmol/L 139   Potassium 3.5 - 5.3 mmol/L 4.4   Chloride 98 - 110 mmol/L 103   CO2 20 - 32 mmol/L 29   Calcium  8.6 - 10.4 mg/dL 9.9   Total Protein 6.1 - 8.1 g/dL 6.9   Total Bilirubin 0.2 - 1.2 mg/dL 0.3   AST 10 - 35 U/L 17   ALT 6 - 29 U/L 16       Latest Ref Rng & Units 06/08/2023    3:17 PM  CBC  WBC 3.8 - 10.8 Thousand/uL 5.2   Hemoglobin 11.7 - 15.5 g/dL 13.2   Hematocrit 44.0 - 45.0 % 35.3   Platelets 140 - 400 Thousand/uL 244      Assessment and  plan- Patient is a 73 y.o. female with history of right breast DCIS ER positive here for routine follow-up  Patient is now more than 5 years out of her diagnosis of DCIS.  She has completed 5 years of endocrine therapy as well.She clinically she is doing well with no concerning signs and symptoms of recurrence based on today's exam.  She can continue to follow-up with her primary care provider at this time.  Her mammogram from November 2024 was unremarkable.  She can be referred to us  in the future if questions or concerns arise   Visit Diagnosis 1. Encounter for follow-up surveillance of ductal carcinoma in situ (DCIS) of breast      Dr. Seretha Dance, MD, MPH [facility] at [facility] [phone] 09/09/2023 2:30 PM

## 2023-11-11 DIAGNOSIS — E790 Hyperuricemia without signs of inflammatory arthritis and tophaceous disease: Secondary | ICD-10-CM | POA: Diagnosis not present

## 2023-11-11 DIAGNOSIS — N184 Chronic kidney disease, stage 4 (severe): Secondary | ICD-10-CM | POA: Diagnosis not present

## 2023-11-11 DIAGNOSIS — R809 Proteinuria, unspecified: Secondary | ICD-10-CM | POA: Diagnosis not present

## 2023-11-11 DIAGNOSIS — I129 Hypertensive chronic kidney disease with stage 1 through stage 4 chronic kidney disease, or unspecified chronic kidney disease: Secondary | ICD-10-CM | POA: Diagnosis not present

## 2023-11-18 DIAGNOSIS — R809 Proteinuria, unspecified: Secondary | ICD-10-CM | POA: Diagnosis not present

## 2023-11-18 DIAGNOSIS — N1832 Chronic kidney disease, stage 3b: Secondary | ICD-10-CM | POA: Diagnosis not present

## 2023-11-18 DIAGNOSIS — I129 Hypertensive chronic kidney disease with stage 1 through stage 4 chronic kidney disease, or unspecified chronic kidney disease: Secondary | ICD-10-CM | POA: Diagnosis not present

## 2023-11-18 DIAGNOSIS — D631 Anemia in chronic kidney disease: Secondary | ICD-10-CM | POA: Diagnosis not present

## 2023-12-07 ENCOUNTER — Ambulatory Visit: Payer: 59 | Admitting: Family Medicine

## 2023-12-14 ENCOUNTER — Encounter: Payer: Self-pay | Admitting: Family Medicine

## 2023-12-14 ENCOUNTER — Ambulatory Visit: Admitting: Family Medicine

## 2023-12-14 VITALS — BP 126/66 | HR 74 | Resp 16 | Ht 61.0 in | Wt 187.0 lb

## 2023-12-14 DIAGNOSIS — E790 Hyperuricemia without signs of inflammatory arthritis and tophaceous disease: Secondary | ICD-10-CM

## 2023-12-14 DIAGNOSIS — E785 Hyperlipidemia, unspecified: Secondary | ICD-10-CM | POA: Diagnosis not present

## 2023-12-14 DIAGNOSIS — G629 Polyneuropathy, unspecified: Secondary | ICD-10-CM

## 2023-12-14 DIAGNOSIS — K219 Gastro-esophageal reflux disease without esophagitis: Secondary | ICD-10-CM

## 2023-12-14 DIAGNOSIS — I10 Essential (primary) hypertension: Secondary | ICD-10-CM

## 2023-12-14 DIAGNOSIS — E538 Deficiency of other specified B group vitamins: Secondary | ICD-10-CM

## 2023-12-14 DIAGNOSIS — N1832 Chronic kidney disease, stage 3b: Secondary | ICD-10-CM

## 2023-12-14 DIAGNOSIS — M109 Gout, unspecified: Secondary | ICD-10-CM

## 2023-12-14 DIAGNOSIS — E782 Mixed hyperlipidemia: Secondary | ICD-10-CM

## 2023-12-14 DIAGNOSIS — E559 Vitamin D deficiency, unspecified: Secondary | ICD-10-CM | POA: Diagnosis not present

## 2023-12-14 DIAGNOSIS — E66812 Obesity, class 2: Secondary | ICD-10-CM

## 2023-12-14 DIAGNOSIS — R7303 Prediabetes: Secondary | ICD-10-CM

## 2023-12-14 DIAGNOSIS — Z6835 Body mass index (BMI) 35.0-35.9, adult: Secondary | ICD-10-CM

## 2023-12-14 DIAGNOSIS — Z1231 Encounter for screening mammogram for malignant neoplasm of breast: Secondary | ICD-10-CM

## 2023-12-14 DIAGNOSIS — Z13820 Encounter for screening for osteoporosis: Secondary | ICD-10-CM

## 2023-12-14 LAB — POCT GLYCOSYLATED HEMOGLOBIN (HGB A1C): Hemoglobin A1C: 6 % — AB (ref 4.0–5.6)

## 2023-12-14 MED ORDER — METOPROLOL SUCCINATE ER 25 MG PO TB24: 25.0000 mg | ORAL_TABLET | Freq: Every day | ORAL | 1 refills | Status: AC

## 2023-12-14 MED ORDER — ALLOPURINOL 100 MG PO TABS: 200.0000 mg | ORAL_TABLET | Freq: Every day | ORAL | 1 refills | Status: AC

## 2023-12-14 MED ORDER — GABAPENTIN 300 MG PO CAPS: 300.0000 mg | ORAL_CAPSULE | Freq: Every day | ORAL | 1 refills | Status: AC

## 2023-12-14 MED ORDER — EZETIMIBE 10 MG PO TABS: 10.0000 mg | ORAL_TABLET | Freq: Every day | ORAL | 3 refills | Status: AC

## 2023-12-14 MED ORDER — ROSUVASTATIN CALCIUM 5 MG PO TABS: 5.0000 mg | ORAL_TABLET | ORAL | 3 refills | Status: AC

## 2023-12-14 MED ORDER — TRIAMTERENE-HCTZ 37.5-25 MG PO TABS
1.0000 | ORAL_TABLET | Freq: Every day | ORAL | 1 refills | Status: AC
Start: 2023-12-14 — End: ?

## 2023-12-14 NOTE — Patient Instructions (Signed)
 Health Maintenance  Topic Date Due   DEXA scan (bone density measurement)  06/12/2023   Medicare Annual Wellness Visit  01/07/2024   COVID-19 Vaccine (5 - 2024-25 season) 12/30/2023*   Zoster (Shingles) Vaccine (1 of 2) 03/15/2024*   Flu Shot  08/02/2024*   DTaP/Tdap/Td vaccine (2 - Td or Tdap) 12/13/2024*   Mammogram  03/22/2024   Colon Cancer Screening  08/29/2031   Pneumococcal Vaccine for age over 50  Completed   Hepatitis C Screening  Completed   Hepatitis B Vaccine  Aged Out   HPV Vaccine  Aged Out   Meningitis B Vaccine  Aged Out  *Topic was postponed. The date shown is not the original due date.   [facility] at [facility] [address], [place], [place] [address] Scheduling phone #: [phone]  Call to schedule your mammogram and bone density if you do not hear from them in the next couple months.

## 2023-12-14 NOTE — Progress Notes (Signed)
 Name: Marissa Nelson   MRN: [id number]    DOB: 1951/01/06   Date:12/14/2023       Progress Note  Chief Complaint  Patient presents with   Medical Management of Chronic Issues    6 month follow-up   Hypertension   Hyperlipidemia   Prediabetes     Subjective:   Marissa Nelson is a 73 y.o. female, presents to clinic for routine follow up on chronic conditions  Discussed the use of AI scribe software for clinical note transcription with the patient, who gave verbal consent to proceed.  History of Present Illness Marissa Nelson is a 73 year old female who presents for a six-month routine follow-up.  Chronic kidney disease and anemia - Estimated GFR is 39 on recent nephrology labs, consistent with stage 3b chronic kidney disease. - Mild anemia present with hemoglobin at 11.6 g/dL. - Parathyroid  hormone levels are within normal limits. - Continues spironolactone and triamterene  hydrochlorothiazide .  Dyslipidemia and statin intolerance - Last cholesterol panel six months ago showed elevated LDL cholesterol. - Statin intolerance characterized by severe fatigue with regular use. - Currently takes Zetia  and low-dose Lipitor a few days per week.  Peripheral neuropathy - Experiences 'pins and needles' sensation in feet and legs, especially at night, sometimes causing awakening from sleep. - Burning pain primarily in the left leg, extending to the bottom of the foot. - Uses gabapentin  300 mg at bedtime as needed for symptom control.  Hypertension and edema - Takes triamterene  hydrochlorothiazide  and metoprolol  for blood pressure control. - Uses a diuretic as needed for swelling.  Gout - No recent gout flare-ups. - No change in allopurinol  dose.  Lab Results  Component Value Date   HGBA1C 6.1 (H) 11/11/2022   Lab Results  Component Value Date   CHOL 215 (H) 06/08/2023   HDL 61 06/08/2023   LDLCALC 129 (H) 06/08/2023   TRIG 136 06/08/2023   CHOLHDL 3.5  06/08/2023       Current Outpatient Medications:    acetaminophen  (TYLENOL ) 325 MG tablet, Take 650 mg by mouth every 6 (six) hours as needed for mild pain or headache. , Disp: , Rfl:    allopurinol  (ZYLOPRIM ) 100 MG tablet, Take 2 tablets (200 mg total) by mouth daily., Disp: 180 tablet, Rfl: 1   Ascorbic Acid  (VITAMIN C) 1000 MG tablet, Take 1,000 mg by mouth in the morning., Disp: , Rfl:    calcium  carbonate (OS-CAL) 600 MG TABS tablet, Take 600 mg by mouth daily with breakfast., Disp: , Rfl:    Cholecalciferol  (VITAMIN D3) 50 MCG (2000 UT) TABS, Take 2,000 Units by mouth daily., Disp: , Rfl:    Cyanocobalamin  (VITAMIN B-12) 500 MCG SUBL, Place 500 mcg under the tongue daily., Disp: , Rfl:    ezetimibe  (ZETIA ) 10 MG tablet, Take 1 tablet (10 mg total) by mouth daily., Disp: 90 tablet, Rfl: 3   fluticasone  (FLONASE ) 50 MCG/ACT nasal spray, Place 2 sprays into both nostrils daily., Disp: 16 g, Rfl: 6   latanoprost  (XALATAN ) 0.005 % ophthalmic solution, Place 1 drop into both eyes at bedtime., Disp: , Rfl:    loratadine -pseudoephedrine  (CLARITIN -D 24-HOUR) 10-240 MG 24 hr tablet, Take 1 tablet by mouth daily as needed for allergies., Disp: 30 tablet, Rfl: 1   metoprolol  succinate (TOPROL -XL) 25 MG 24 hr tablet, Take 1 tablet (25 mg total) by mouth daily., Disp: 90 tablet, Rfl: 1   rosuvastatin  (CRESTOR ) 5 MG tablet, Take 1 tablet (5 mg total) by  mouth every other day., Disp: 45 tablet, Rfl: 3   torsemide  (DEMADEX ) 10 MG tablet, Take by mouth., Disp: , Rfl:    triamterene -hydrochlorothiazide  (MAXZIDE -25) 37.5-25 MG tablet, Take 1 tablet by mouth daily., Disp: 90 tablet, Rfl: 1  Patient Active Problem List   Diagnosis Date Noted   Gastroesophageal reflux disease 06/12/2023   Acute gout involving toe of right foot 01/22/2022   History of ductal carcinoma in situ (DCIS) of breast 10/15/2021   S/P hip replacement 10/11/2020   Hyperuricemia 08/25/2019   Anemia in chronic kidney disease  05/25/2019   Benign hypertensive kidney disease with chronic kidney disease 05/25/2019   Vitamin B12 deficiency 05/07/2017   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   Arthralgia 10/04/2016   Pre-diabetes 06/20/2016   Stage 3b chronic kidney disease (CKD) (HCC) 11/15/2015   Vitamin D  deficiency 10/29/2015   HLD (hyperlipidemia) 06/15/2015   Class 1 obesity with serious comorbidity and body mass index (BMI) of 32.0 to 32.9 in adult 06/15/2015   Chronic left shoulder pain 11/08/2014   Benign essential HTN 12/07/2006    Past Surgical History:  Procedure Laterality Date   ABDOMINAL HYSTERECTOMY     BREAST BIOPSY Right 01/15/2016    cylinder shape calcs UOQ, FIBROADENOMATOUS   BREAST BIOPSY Right 12/30/2016   stereo bx for calcifications: DCIS UOQ, ribbon shaped   BREAST BIOPSY Right 01/08/2017   LOQ x shaped, FIBROADENOMATOUS CHANGE WITH   BREAST BIOPSY Left 03/09/2019   calcs bx, x marker neg   BREAST LUMPECTOMY Right 01/30/2018   DCIS clear margins   BREAST LUMPECTOMY WITH NEEDLE LOCALIZATION Right 01/30/2017   Procedure: BREAST LUMPECTOMY WITH NEEDLE LOCALIZATION;  Surgeon: Nicholaus Selinda Birmingham, MD;  Location: [facility] OR[facility];  Service: General;  Laterality: Right;   COLONOSCOPY WITH PROPOFOL  N/A 11/19/2016   Procedure: COLONOSCOPY WITH PROPOFOL ;  Surgeon: Therisa Bi, MD;  Location: [facility];  Service: Endoscopy;  Laterality: N/A;   COLONOSCOPY WITH PROPOFOL  N/A 08/28/2021   Procedure: COLONOSCOPY WITH PROPOFOL ;  Surgeon: Therisa Bi, MD;  Location: [facility] ENDOSCOPY;  Service: Gastroenterology;  Laterality: N/A;   ESOPHAGOGASTRODUODENOSCOPY N/A 08/28/2021   Procedure: ESOPHAGOGASTRODUODENOSCOPY (EGD);  Surgeon: Therisa Bi, MD;  Location: [facility] ENDOSCOPY;  Service: Gastroenterology;  Laterality: N/A;   ESOPHAGOGASTRODUODENOSCOPY (EGD) WITH PROPOFOL  N/A 11/19/2016   Procedure: ESOPHAGOGASTRODUODENOSCOPY (EGD) WITH PROPOFOL ;  Surgeon: Therisa Bi, MD;  Location: [facility];  Service: Endoscopy;  Laterality: N/A;   GIVENS CAPSULE STUDY N/A 12/12/2016   Procedure: GIVENS CAPSULE STUDY;  Surgeon: Therisa Bi, MD;  Location: [facility] ENDOSCOPY;  Service: Gastroenterology;  Laterality: N/A;   TOTAL HIP ARTHROPLASTY Right 10/11/2020   Procedure: TOTAL HIP ARTHROPLASTY ANTERIOR APPROACH;  Surgeon: Kathlynn Sharper, MD;  Location: [facility] ORS;  Service: Orthopedics;  Laterality: Right;   TOTAL MASTECTOMY Right 01/30/2017   Error    Family History  Problem Relation Age of Onset   Cancer Mother 14       liver cancer   Diabetes Mother    Hypertension Mother    Glomerulonephritis Father    Diabetes Sister    Glomerulonephritis Sister    Cancer Sister 50       throat cancer   Heart disease Sister    Diabetes Sister    Diabetes Sister    COPD Sister    Diabetes Sister    Breast cancer Cousin        Currently undergoing systemic chemotherapy (01/2017)   Varicose Veins Neg Hx  Social History   Tobacco Use   Smoking status: Never   Smokeless tobacco: Never  Vaping Use   Vaping status: Never Used  Substance Use Topics   Alcohol use: No    Alcohol/week: 0.0 standard drinks of alcohol   Drug use: No     Allergies  Allergen Reactions   Penicillins Anaphylaxis   Shellfish Allergy Swelling    angioedema    Health Maintenance  Topic Date Due   DEXA SCAN  06/12/2023   Medicare Annual Wellness (AWV)  01/07/2024   COVID-19 Vaccine (5 - 2024-25 season) 12/30/2023 (Originally 01/04/2023)   Zoster Vaccines- Shingrix (1 of 2) 03/15/2024 (Originally 10/31/1967)   INFLUENZA VACCINE  08/02/2024 (Originally 12/04/2023)   DTaP/Tdap/Td (2 - Td or Tdap) 12/13/2024 (Originally 08/03/2023)   MAMMOGRAM  03/22/2024   Colonoscopy  08/29/2031   Pneumococcal Vaccine: 50+ Years  Completed   Hepatitis C Screening  Completed   Hepatitis B Vaccines  Aged Out   HPV VACCINES  Aged Out   Meningococcal B Vaccine  Aged Out    Chart Review Today: I personally reviewed  active problem list, medication list, allergies, family history, social history, health maintenance, notes from last encounter, lab results, imaging with the patient/caregiver today.   Review of Systems  Constitutional: Negative.   HENT: Negative.    Eyes: Negative.   Respiratory: Negative.    Cardiovascular: Negative.   Gastrointestinal: Negative.   Endocrine: Negative.   Genitourinary: Negative.   Musculoskeletal: Negative.   Skin: Negative.   Allergic/Immunologic: Negative.   Neurological: Negative.   Hematological: Negative.   Psychiatric/Behavioral: Negative.    All other systems reviewed and are negative.    Objective:   Vitals:   12/14/23 0955  BP: 126/66  Pulse: 74  Resp: 16  SpO2: 99%  Weight: 187 lb (84.8 kg)  Height: 5' 1 (1.549 m)    Body mass index is 35.33 kg/m.  Physical Exam Vitals and nursing note reviewed.  Constitutional:      General: She is not in acute distress.    Appearance: Normal appearance. She is well-developed. She is not ill-appearing, toxic-appearing or diaphoretic.  HENT:     Head: Normocephalic and atraumatic.     Right Ear: External ear normal.     Left Ear: External ear normal.     Nose: Nose normal.  Eyes:     General: No scleral icterus.       Right eye: No discharge.        Left eye: No discharge.     Conjunctiva/sclera: Conjunctivae normal.  Neck:     Trachea: No tracheal deviation.  Cardiovascular:     Rate and Rhythm: Normal rate and regular rhythm.     Pulses: Normal pulses.     Heart sounds: Normal heart sounds.  Pulmonary:     Effort: Pulmonary effort is normal. No respiratory distress.     Breath sounds: No stridor.  Musculoskeletal:     Right lower leg: Edema present.     Left lower leg: Edema present.  Skin:    General: Skin is warm and dry.     Findings: No rash.  Neurological:     Mental Status: She is alert.     Motor: No abnormal muscle tone.     Coordination: Coordination normal.     Gait: Gait  normal.  Psychiatric:        Mood and Affect: Mood normal.        Behavior: Behavior normal.  Results for orders placed or performed in visit on 06/08/23  Uric acid   Collection Time: 06/08/23  3:17 PM  Result Value Ref Range   Uric Acid, Serum 7.3 (H) 2.5 - 7.0 mg/dL  Microalbumin / creatinine urine ratio   Collection Time: 06/08/23  3:17 PM  Result Value Ref Range   Creatinine, Urine 31 20 - 275 mg/dL   Microalb, Ur 0.7 mg/dL   Microalb Creat Ratio 23 <30 mg/g creat  CBC with Differential/Platelet   Collection Time: 06/08/23  3:17 PM  Result Value Ref Range   WBC 5.2 3.8 - 10.8 Thousand/uL   RBC 3.69 (L) 3.80 - 5.10 Million/uL   Hemoglobin 11.4 (L) 11.7 - 15.5 g/dL   HCT 64.6 64.9 - 54.9 %   MCV 95.7 80.0 - 100.0 fL   MCH 30.9 27.0 - 33.0 pg   MCHC 32.3 32.0 - 36.0 g/dL   RDW 87.1 88.9 - 84.9 %   Platelets 244 140 - 400 Thousand/uL   MPV 11.3 7.5 - 12.5 fL   Neutro Abs 2,423 1,500 - 7,800 cells/uL   Absolute Lymphocytes 2,184 850 - 3,900 cells/uL   Absolute Monocytes 421 200 - 950 cells/uL   Eosinophils Absolute 120 15 - 500 cells/uL   Basophils Absolute 52 0 - 200 cells/uL   Neutrophils Relative % 46.6 %   Total Lymphocyte 42.0 %   Monocytes Relative 8.1 %   Eosinophils Relative 2.3 %   Basophils Relative 1.0 %  COMPLETE METABOLIC PANEL WITH GFR   Collection Time: 06/08/23  3:17 PM  Result Value Ref Range   Glucose, Bld 81 65 - 99 mg/dL   BUN 26 (H) 7 - 25 mg/dL   Creat 8.66 (H) 9.39 - 1.00 mg/dL   eGFR 42 (L) > OR = 60 mL/min/1.15m2   BUN/Creatinine Ratio 20 6 - 22 (calc)   Sodium 139 135 - 146 mmol/L   Potassium 4.4 3.5 - 5.3 mmol/L   Chloride 103 98 - 110 mmol/L   CO2 29 20 - 32 mmol/L   Calcium  9.9 8.6 - 10.4 mg/dL   Total Protein 6.9 6.1 - 8.1 g/dL   Albumin 4.1 3.6 - 5.1 g/dL   Globulin 2.8 1.9 - 3.7 g/dL (calc)   AG Ratio 1.5 1.0 - 2.5 (calc)   Total Bilirubin 0.3 0.2 - 1.2 mg/dL   Alkaline phosphatase (APISO) 92 37 - 153 U/L   AST 17 10 -  35 U/L   ALT 16 6 - 29 U/L  Lipid Profile   Collection Time: 06/08/23  3:17 PM  Result Value Ref Range   Cholesterol 215 (H) <200 mg/dL   HDL 61 > OR = 50 mg/dL   Triglycerides 863 <849 mg/dL   LDL Cholesterol (Calc) 129 (H) mg/dL (calc)   Total CHOL/HDL Ratio 3.5 <5.0 (calc)   Non-HDL Cholesterol (Calc) 154 (H) <130 mg/dL (calc)  Parathyroid  hormone, intact (no Ca)   Collection Time: 06/08/23  3:17 PM  Result Value Ref Range   PTH 53 16 - 77 pg/mL  VITAMIN D  25 Hydroxy (Vit-D Deficiency, Fractures)   Collection Time: 06/08/23  3:17 PM  Result Value Ref Range   Vit D, 25-Hydroxy 35 30 - 100 ng/mL  Vitamin B12   Collection Time: 06/08/23  3:17 PM  Result Value Ref Range   Vitamin B-12 686 200 - 1,100 pg/mL      Assessment & Plan:    Assessment & Plan Benign essential HTN Assessment & Plan: BP  med changes per nephrology Blood pressure well-controlled at 126/66 mmHg. - Continue current antihypertensive regimen. BP Readings from Last 3 Encounters:  12/14/23 126/66  09/09/23 113/62  09/03/23 128/76  On triamterene , HCTZ toprol -XL and spironolactone with nephrology f/up and labs in 2 d At some point in the last 6+ months ARB was stopped?   Orders: -     Metoprolol  Succinate ER; Take 1 tablet (25 mg total) by mouth daily.  Dispense: 90 tablet; Refill: 1 -     Triamterene -HCTZ; Take 1 tablet by mouth daily.  Dispense: 90 tablet; Refill: 1  Mixed hyperlipidemia Assessment & Plan: LDL cholesterol elevated. Statin intolerance limits high-dose statins due to severe fatigue. Currently on Zetia  and low-dose rosuvastatin . - Refill Zetia  and rosuvastatin  prescriptions. - Encourage dietary modifications to reduce trans fats, saturated fats, and fried foods. Lab Results  Component Value Date   CHOL 215 (H) 06/08/2023   HDL 61 06/08/2023   LDLCALC 129 (H) 06/08/2023   TRIG 136 06/08/2023   CHOLHDL 3.5 06/08/2023   Discussed options like repatha to  consider   Pre-diabetes Assessment & Plan: Pre-diabetes A1c at 6.0% indicates pre-diabetic range. Discussed potential SGLT2 inhibitors for kidney protection and blood sugar control if needed. - Perform A1c test using finger stick method. - Monitor A1c every 6 to 12 months. Continue to monitor A1c and manage with diet/lifestyle efforts   Orders: -     POCT glycosylated hemoglobin (Hb A1C)  Stage 3b chronic kidney disease (HCC)  Gastroesophageal reflux disease, unspecified whether esophagitis present Assessment & Plan: Symptoms are currently well-controlled with over-the-counter medications -omeprazole    Hyperuricemia Assessment & Plan: Managed by allopurinol  200 mg daily, prior uric acid labs were greater than 6.0 but she is not having gout flares Anticipate uric acid level will be done by nephrology  Orders: -     Allopurinol ; Take 2 tablets (200 mg total) by mouth daily.  Dispense: 180 tablet; Refill: 1  Vitamin D  deficiency Assessment & Plan: On supplements Last vitamin D  Lab Results  Component Value Date   VD25OH 30 06/08/2023   Last labs were at goal Frequently being addressed or checked by nephrology   Vitamin B12 deficiency Assessment & Plan: Patient is having worse restless leg symptoms with a history of anemia of chronic disease, lower extremity edema and B12 deficiency She does not want to labs here today consider asking nephrology if they are able to add on this lab or she can return for labs Goal B12 >400   Osteoporosis screening -     DG Bone Density; Future  Breast cancer screening by mammogram -     3D Screening Mammogram, Left and Right; Future  Neuropathy -     Gabapentin ; Take 1 capsule (300 mg total) by mouth at bedtime.  Dispense: 90 capsule; Refill: 1  Hyperlipidemia, unspecified hyperlipidemia type Assessment & Plan: LDL cholesterol elevated. Statin intolerance limits high-dose statins due to severe fatigue. Currently on Zetia  and  low-dose rosuvastatin . - Refill Zetia  and rosuvastatin  prescriptions. - Encourage dietary modifications to reduce trans fats, saturated fats, and fried foods. Lab Results  Component Value Date   CHOL 215 (H) 06/08/2023   HDL 61 06/08/2023   LDLCALC 129 (H) 06/08/2023   TRIG 136 06/08/2023   CHOLHDL 3.5 06/08/2023   Discussed options like repatha to consider  Orders: -     Rosuvastatin  Calcium ; Take 1 tablet (5 mg total) by mouth every other day.  Dispense: 45 tablet; Refill: 3 -  Ezetimibe ; Take 1 tablet (10 mg total) by mouth daily.  Dispense: 90 tablet; Refill: 3  Acute gout involving toe of right foot, unspecified cause Assessment & Plan: No recent flare-ups. Uric acid levels above goal but asymptomatic. Emphasized treating symptoms rather than numbers unless flare-ups occur. - Continue current allopurinol  dose. - Monitor for any gout flare-ups and adjust treatment if necessary. Reviewed her uric acid levels/labs with her and explained goal is to have <6.0   Stage 3b chronic kidney disease (CKD) (HCC) Assessment & Plan: Managed by nephrology, due for labs in 2 days Chronic kidney disease stage 3b Estimated GFR at 39 mL/min/1.73 m. Spironolactone added for swelling per pt. Discussed potential SGLT2 inhibitors for kidney protection if proteinuria increases can ask nephro and may be a good option if prediabetes worsens or she develops type 2 diabetes - Follow up with nephrology for kidney function and electrolyte monitoring.   Class 2 severe obesity with serious comorbidity and body mass index (BMI) of 35.0 to 35.9 in adult, unspecified obesity type [facility]) Assessment & Plan: Obesity with BMI 35 with multiple associated comorbidities including prediabetes, hyperlipidemia, hypertension, CKD stage IIIb Patient continues to work on diet and lifestyle efforts Wt Readings from Last 5 Encounters:  12/14/23 187 lb (84.8 kg)  09/09/23 187 lb (84.8 kg)  09/03/23 184 lb 12.8 oz (83.8  kg)  06/08/23 184 lb (83.5 kg)  02/25/23 182 lb (82.6 kg)   BMI Readings from Last 5 Encounters:  12/14/23 35.33 kg/m  09/09/23 35.33 kg/m  09/03/23 34.92 kg/m  06/08/23 34.77 kg/m  02/25/23 34.39 kg/m         Peripheral neuropathy, left lower extremity Experiencing pins and needles sensation, particularly at night. Symptoms may relate to prolonged standing. Gabapentin  previously prescribed for nerve pain. - Refill gabapentin  prescription for as-needed use at bedtime. - Consider use of compression stockings on long days or during travel. - Avoid NSAIDs due to kidney function; use Tylenol  for pain management.  Recording duration: 23 minutes    Return in about 6 months (around 06/15/2024) for Routine follow-up.   Michelene Cower, PA-C 12/14/23 10:08 AM

## 2023-12-14 NOTE — Assessment & Plan Note (Signed)
 No recent flare-ups. Uric acid levels above goal but asymptomatic. Emphasized treating symptoms rather than numbers unless flare-ups occur. - Continue current allopurinol  dose. - Monitor for any gout flare-ups and adjust treatment if necessary. Reviewed her uric acid levels/labs with her and explained goal is to have <6.0

## 2023-12-14 NOTE — Assessment & Plan Note (Signed)
 LDL cholesterol elevated. Statin intolerance limits high-dose statins due to severe fatigue. Currently on Zetia  and low-dose rosuvastatin . - Refill Zetia  and rosuvastatin  prescriptions. - Encourage dietary modifications to reduce trans fats, saturated fats, and fried foods. Lab Results  Component Value Date   CHOL 215 (H) 06/08/2023   HDL 61 06/08/2023   LDLCALC 129 (H) 06/08/2023   TRIG 136 06/08/2023   CHOLHDL 3.5 06/08/2023   Discussed options like repatha to consider

## 2023-12-14 NOTE — Assessment & Plan Note (Signed)
 BP med changes per nephrology Blood pressure well-controlled at 126/66 mmHg. - Continue current antihypertensive regimen. BP Readings from Last 3 Encounters:  12/14/23 126/66  09/09/23 113/62  09/03/23 128/76  On triamterene , HCTZ toprol -XL and spironolactone with nephrology f/up and labs in 2 d At some point in the last 6+ months ARB was stopped?

## 2023-12-23 DIAGNOSIS — I129 Hypertensive chronic kidney disease with stage 1 through stage 4 chronic kidney disease, or unspecified chronic kidney disease: Secondary | ICD-10-CM | POA: Diagnosis not present

## 2023-12-23 DIAGNOSIS — R809 Proteinuria, unspecified: Secondary | ICD-10-CM | POA: Diagnosis not present

## 2023-12-23 DIAGNOSIS — D631 Anemia in chronic kidney disease: Secondary | ICD-10-CM | POA: Diagnosis not present

## 2023-12-23 DIAGNOSIS — N1832 Chronic kidney disease, stage 3b: Secondary | ICD-10-CM | POA: Diagnosis not present

## 2023-12-25 ENCOUNTER — Ambulatory Visit (INDEPENDENT_AMBULATORY_CARE_PROVIDER_SITE_OTHER)

## 2023-12-25 DIAGNOSIS — Z0000 Encounter for general adult medical examination without abnormal findings: Secondary | ICD-10-CM

## 2023-12-25 NOTE — Patient Instructions (Signed)
 Marissa Nelson , Thank you for taking time out of your busy schedule to complete your Annual Wellness Visit with me. I enjoyed our conversation and look forward to speaking with you again next year. I, as well as your care team,  appreciate your ongoing commitment to your health goals. Please review the following plan we discussed and let me know if I can assist you in the future.   Follow up Visits: 12/30/24 @ 8:50 AM BY PHONE We will see or speak with you next year for your Next Medicare AWV with our clinical staff Have you seen your provider in the last 6 months (3 months if uncontrolled diabetes)? Yes  Clinician Recommendations:  Aim for 30 minutes of exercise or brisk walking, 6-8 glasses of water , and 5 servings of fruits and vegetables each day. TAKE CARE!      This is a list of the screenings recommended for you:  Health Maintenance  Topic Date Due   DEXA scan (bone density measurement)  06/12/2023   COVID-19 Vaccine (5 - 2024-25 season) 12/30/2023*   Zoster (Shingles) Vaccine (1 of 2) 03/15/2024*   Flu Shot  08/02/2024*   DTaP/Tdap/Td vaccine (2 - Td or Tdap) 12/13/2024*   Mammogram  03/22/2024   Medicare Annual Wellness Visit  12/24/2024   Colon Cancer Screening  08/29/2031   Pneumococcal Vaccine for age over 78  Completed   Hepatitis C Screening  Completed   HPV Vaccine  Aged Out   Meningitis B Vaccine  Aged Out  *Topic was postponed. The date shown is not the original due date.    Advanced directives: (ACP Link)Information on Advanced Care Planning can be found at [place]  Secretary of [facility] Advance Health Care Directives Advance Health Care Directives. [email or web address]  Advance Care Planning is important because it:  [x]  Makes sure you receive the medical care that is consistent with your values, goals, and preferences  [x]  It provides guidance to your family and loved ones and reduces their decisional burden about whether or not they are making the right decisions based on  your wishes.  Follow the link provided in your after visit summary or read over the paperwork we have mailed to you to help you started getting your Advance Directives in place. If you need assistance in completing these, please reach out to us  so that we can help you!

## 2023-12-25 NOTE — Progress Notes (Signed)
 Subjective:   Marissa Nelson is a 73 y.o. who presents for a Medicare Wellness preventive visit.  As a reminder, Annual Wellness Visits don't include a physical exam, and some assessments may be limited, especially if this visit is performed virtually. We may recommend an in-person follow-up visit with your provider if needed.  Visit Complete: Virtual I connected with  Marissa Nelson on 12/25/23 by a audio enabled telemedicine application and verified that I am speaking with the correct person using two identifiers.  Patient Location: Home  Provider Location: Home Office  I discussed the limitations of evaluation and management by telemedicine. The patient expressed understanding and agreed to proceed.  Vital Signs: Because this visit was a virtual/telehealth visit, some criteria may be missing or patient reported. Any vitals not documented were not able to be obtained and vitals that have been documented are patient reported.  VideoDeclined- This patient declined Librarian, academic. Therefore the visit was completed with audio only.  Persons Participating in Visit: Patient.  AWV Questionnaire: No: Patient Medicare AWV questionnaire was not completed prior to this visit.  Cardiac Risk Factors include: advanced age (>27men, >28 women);dyslipidemia;hypertension;sedentary lifestyle;obesity (BMI >30kg/m2)     Objective:    There were no vitals filed for this visit. There is no height or weight on file to calculate BMI.     12/25/2023   10:00 AM 09/09/2023    2:14 PM 01/07/2023    3:07 PM 09/09/2022    1:22 PM 03/05/2022    2:18 PM 02/01/2022    4:48 PM 09/02/2021    2:35 PM  Advanced Directives  Does Patient Have a Medical Advance Directive? No Yes Yes Yes Yes No Yes  Type of Special educational needs teacher of [place];Living will Healthcare Power of [place];Living will Healthcare Power of [place];Living will Living will  Living will  Copy of  Healthcare Power of Attorney in Chart?   No - copy requested      Would patient like information on creating a medical advance directive? No - Patient declined      No - Patient declined    Current Medications (verified) Outpatient Encounter Medications as of 12/25/2023  Medication Sig   acetaminophen  (TYLENOL ) 325 MG tablet Take 650 mg by mouth every 6 (six) hours as needed for mild pain or headache.    allopurinol  (ZYLOPRIM ) 100 MG tablet Take 2 tablets (200 mg total) by mouth daily.   Ascorbic Acid  (VITAMIN C) 1000 MG tablet Take 1,000 mg by mouth in the morning.   calcium  carbonate (OS-CAL) 600 MG TABS tablet Take 600 mg by mouth daily with breakfast.   Cholecalciferol  (VITAMIN D3) 50 MCG (2000 UT) TABS Take 2,000 Units by mouth daily.   Cyanocobalamin  (VITAMIN B-12) 500 MCG SUBL Place 500 mcg under the tongue daily.   ezetimibe  (ZETIA ) 10 MG tablet Take 1 tablet (10 mg total) by mouth daily.   gabapentin  (NEURONTIN ) 300 MG capsule Take 1 capsule (300 mg total) by mouth at bedtime.   loratadine -pseudoephedrine  (CLARITIN -D 24-HOUR) 10-240 MG 24 hr tablet Take 1 tablet by mouth daily as needed for allergies.   metoprolol  succinate (TOPROL -XL) 25 MG 24 hr tablet Take 1 tablet (25 mg total) by mouth daily.   rosuvastatin  (CRESTOR ) 5 MG tablet Take 1 tablet (5 mg total) by mouth every other day.   torsemide  (DEMADEX ) 10 MG tablet Take by mouth. (Patient taking differently: Take by mouth. TAKES PRN)   triamterene -hydrochlorothiazide  (MAXZIDE -25) 37.5-25 MG tablet  Take 1 tablet by mouth daily.   fluticasone  (FLONASE ) 50 MCG/ACT nasal spray Place 2 sprays into both nostrils daily. (Patient not taking: Reported on 12/25/2023)   latanoprost  (XALATAN ) 0.005 % ophthalmic solution Place 1 drop into both eyes at bedtime. (Patient not taking: Reported on 12/25/2023)   No facility-administered encounter medications on file as of 12/25/2023.    Allergies (verified) Penicillins and Shellfish allergy    History: Past Medical History:  Diagnosis Date   Abnormal mammogram of right breast 01/17/2016   Recommendation: Six month follow-up diagnostic mammogram of the right breast for an additional group of probably benign Calcifications. 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   RIGHT, August 2018   Family history of diabetes mellitus 06/12/2016   Mother and several other relatives   HLD (hyperlipidemia) 06/15/2015   Hx of right breast biopsy 01/17/2016   Hyperlipidemia    Hypertension    Medication monitoring encounter 11/15/2015   Motion sickness    ocean ships   Numbness and tingling in left hand 11/07/2014   Obesity 10/25/2015   Obesity (BMI 30.0-34.9) 10/25/2015   Personal history of radiation therapy 2018   right breast ca   PONV (postoperative nausea and vomiting)    patient stated she vomits a lot after anesthsia   Positive ANA (antinuclear antibody) 10/04/2016   Prediabetes    Preventative health care 11/15/2015   Vitamin D  deficiency 10/29/2015   Past Surgical History:  Procedure Laterality Date   ABDOMINAL HYSTERECTOMY     BREAST BIOPSY Right 01/15/2016    cylinder shape calcs UOQ, FIBROADENOMATOUS   BREAST BIOPSY Right 12/30/2016   stereo bx for calcifications: DCIS UOQ, ribbon shaped   BREAST BIOPSY Right 01/08/2017   LOQ x shaped, FIBROADENOMATOUS CHANGE WITH   BREAST BIOPSY Left 03/09/2019   calcs bx, x marker neg   BREAST LUMPECTOMY Right 01/30/2018   DCIS clear margins   BREAST LUMPECTOMY WITH NEEDLE LOCALIZATION Right 01/30/2017   Procedure: BREAST LUMPECTOMY WITH NEEDLE LOCALIZATION;  Surgeon: Nicholaus Selinda Birmingham, MD;  Location: [facility];  Service: General;  Laterality: Right;    COLONOSCOPY WITH PROPOFOL  N/A 11/19/2016   Procedure: COLONOSCOPY WITH PROPOFOL ;  Surgeon: Therisa Bi, MD;  Location: [facility];  Service: Endoscopy;  Laterality: N/A;   COLONOSCOPY WITH PROPOFOL  N/A 08/28/2021   Procedure: COLONOSCOPY WITH PROPOFOL ;  Surgeon: Therisa Bi, MD;  Location: [facility] END[facility]COPY;  Service: Gastroenterology;  Laterality: N/A;   ESOPHAGOGASTRODUODENOSCOPY N/A 08/28/2021   Procedure: ESOPHAGOGASTRODUODENOSCOPY (EGD);  Surgeon: Therisa Bi, MD;  Location: [facility] END[facility]COPY;  Service: Gastroenterology;  Laterality: N/A;   ESOPHAGOGASTRODUODENOSCOPY (EGD) WITH PROPOFOL  N/A 11/19/2016   Procedure: ESOPHAGOGASTRODUODENOSCOPY (EGD) WITH PROPOFOL ;  Surgeon: Therisa Bi, MD;  Location[facility];  Service: Endoscopy;  Laterality: N/A;   GIVENS CAPSULE STUDY N/A 12/12/2016   Procedure: GIVENS CAPSULE STUDY;  Surgeon: Therisa Bi, MD;  Location: [facility] ENDOSCOPY;  Service: Gastroenterology;  Laterality: N/A;   TOTAL HIP ARTHROPLASTY Right 10/11/2020   Procedure: TOTAL HIP ARTHROPLASTY ANTERIOR APPROACH;  Surgeon: Kathlynn Sharper, MD;  Location: [facility] ORS;  Service: Orthopedics;  Laterality: Right;   TOTAL MASTECTOMY Right 01/30/2017   Error  Family History  Problem Relation Age of Onset   Cancer Mother 39       liver cancer   Diabetes Mother    Hypertension Mother    Glomerulonephritis Father    Diabetes Sister    Glomerulonephritis Sister    Cancer Sister 92       throat cancer   Heart disease Sister    Diabetes Sister    Diabetes Sister    COPD Sister    Diabetes Sister    Breast cancer Cousin        Currently undergoing systemic chemotherapy (01/2017)   Varicose Veins Neg Hx    Social History   Socioeconomic History   Marital status: Married    Spouse name: Donasia Wimes   Number of children: 2   Years of education: Not on file   Highest education level: Not on file  Occupational History   Occupation: Works in [place] [place], [place])     Employer: [employer]  Tobacco Use   Smoking status: Never   Smokeless tobacco: Never  Vaping Use   Vaping status: Never Used  Substance and Sexual Activity   Alcohol use: No    Alcohol/week: 0.0 standard drinks of alcohol   Drug use: No   Sexual activity: Not Currently  Other Topics Concern   Not on file  Social History Narrative   Not on file   Social Drivers of Health   Financial Resource Strain: Low Risk  (12/25/2023)   Overall Financial Resource Strain (CARDIA)    Difficulty of Paying Living Expenses: Not hard at all  Food Insecurity: No Food Insecurity (12/25/2023)   Hunger Vital Sign    Worried About Running Out of Food in the Last Year: Never true    Ran Out of Food in the Last Year: Never true  Transportation Needs: No Transportation Needs (12/25/2023)   PRAPARE - Transportation    Lack of Transportation (Medical): No    Lack of Transportation (Non-Medical): No  Physical Activity: Insufficiently Active (12/25/2023)   Exercise Vital Sign    Days of Exercise per Week: 2 days    Minutes of Exercise per Session: 20 min  Stress: No Stress Concern Present (12/25/2023)   [employer] of Occupational Health - Occupational Stress Questionnaire    Feeling of Stress: Not at all  Social Connections: Socially Integrated (12/25/2023)   Social Connection and Isolation Panel    Frequency of Communication with Friends and Family: More than three times a week    Frequency of Social Gatherings with Friends and Family: Once a week    Attends Religious Services: More than 4 times per year    Active Member of [employer] or Organizations: Yes    Attends Engineer, structural: More than 4 times per year    Marital Status: Married    Tobacco Counseling Counseling given: Not Answered    Clinical Intake:  Pre-visit preparation completed: Yes  Pain : No/denies pain     BMI - recorded: 35.3 Nutritional Status: BMI > 30  Obese Nutritional Risks:  None Diabetes: No  Lab Results  Component Value Date   HGBA1C 6.0 (A) 12/14/2023   HGBA1C 6.1 (H) 11/11/2022   HGBA1C 6.2 (H) 05/01/2022     How often do you need to have someone help you when you read instructions, pamphlets, or other written materials from your doctor or pharmacy?: 1 - Never  Interpreter Needed?: No  Information entered by :: JHONNIE DAS, LPN  Activities of Daily Living     12/25/2023   10:01 AM 02/25/2023    8:58 AM  In your present state of health, do you have any difficulty performing the following activities:  Hearing? 0 0  Vision? 0 0  Difficulty concentrating or making decisions? 0 0  Walking or climbing stairs? 0 0  Dressing or bathing? 0 0  Doing errands, shopping? 0 0  Preparing Food and eating ? N   Using the Toilet? N   In the past six months, have you accidently leaked urine? N   Do you have problems with loss of bowel control? N   Managing your Medications? N   Managing your Finances? N   Housekeeping or managing your Housekeeping? N     Patient Care Team: Tapia, Leisa, PA-C as PCP - General (Family Medicine) Elma Zachary RAMAN [facility])  I have updated your Care Teams any recent Medical Services you may have received from other providers in the past year.     Assessment:   This is a routine wellness examination for [facility].  Hearing/Vision screen Hearing Screening - Comments:: NO AIDS Vision Screening - Comments:: WEARS GLASSES ALL DAY- [place]- LAST VISIT IN APRIL   Goals Addressed             This Visit's Progress    DIET - EAT MORE FRUITS AND VEGETABLES         Depression Screen     12/25/2023    9:59 AM 09/03/2023    3:04 PM 02/25/2023    8:58 AM 11/11/2022   10:24 AM 05/01/2022    2:38 PM 01/22/2022    9:28 AM 12/10/2021    1:37 PM  PHQ 2/9 Scores  PHQ - 2 Score 0 0 0 0 0 0 0  PHQ- 9 Score 0 0  0 0 0 3    Fall Risk     12/25/2023   10:01 AM 02/25/2023    8:57 AM 11/11/2022   10:24 AM 05/01/2022    2:33  PM 01/22/2022    9:28 AM  Fall Risk   Falls in the past year? 0 1 0 0 0  Number falls in past yr: 0 0 0 0 0  Injury with Fall? 0 0 0 0 0  Risk for fall due to : No Fall Risks No Fall Risks No Fall Risks  No Fall Risks  Follow up Falls evaluation completed;Falls prevention discussed Falls prevention discussed Falls prevention discussed;Education provided;Falls evaluation completed  Falls prevention discussed      Data saved with a previous flowsheet row definition    MEDICARE RISK AT HOME:  Medicare Risk at Home Any stairs in or around the home?: Yes If so, are there any without handrails?: Yes Home free of loose throw rugs in walkways, pet beds, electrical cords, etc?: Yes Adequate lighting in your home to reduce risk of falls?: Yes Life alert?: No Use of a cane, walker or w/c?: No Grab bars in the bathroom?: Yes Shower chair or bench in shower?: No Elevated toilet seat or a handicapped toilet?: Yes  TIMED UP AND GO:  Was the test performed?  No  Cognitive Function: 6CIT completed        12/25/2023   10:03 AM 12/10/2021    1:42 PM  6CIT Screen  What Year? 0 points 0 points  What month? 0 points 0 points  What time? 0 points 0 points  Count back from 20 0 points 0 points  Months  in reverse 0 points   Repeat phrase 0 points 0 points  Total Score 0 points     Immunizations Immunization History  Administered Date(s) Administered   Fluad Quad(high Dose 65+) 01/19/2019, 02/03/2020, 01/18/2021, 02/24/2022   Influenza, High Dose Seasonal PF 03/18/2016, 02/24/2018   Influenza,inj,Quad PF,6+ Mos 02/10/2017   Moderna Covid-19 Vaccine Bivalent Booster 30yrs & up 03/29/2021   Moderna Sars-Covid-2 Vaccination 06/18/2019, 07/16/2019, 04/11/2020   Pneumococcal Conjugate-13 11/15/2015   Pneumococcal Polysaccharide-23 06/08/2017   Tdap 08/02/2013    Screening Tests Health Maintenance  Topic Date Due   DEXA SCAN  06/12/2023   COVID-19 Vaccine (5 - 2024-25 season) 12/30/2023  (Originally 01/04/2023)   Zoster Vaccines- Shingrix (1 of 2) 03/15/2024 (Originally 10/31/1969)   INFLUENZA VACCINE  08/02/2024 (Originally 12/04/2023)   DTaP/Tdap/Td (2 - Td or Tdap) 12/13/2024 (Originally 08/03/2023)   MAMMOGRAM  03/22/2024   Medicare Annual Wellness (AWV)  12/24/2024   Colonoscopy  08/29/2031   Pneumococcal Vaccine: 50+ Years  Completed   Hepatitis C Screening  Completed   HPV VACCINES  Aged Out   Meningococcal B Vaccine  Aged Out    Health Maintenance  Health Maintenance Due  Topic Date Due   DEXA SCAN  06/12/2023   Health Maintenance Items Addressed: NEEDS TDAP, PNA, COVID, SHINGRIX;HAS ORDER FOR MAMMOGRAM; AGED OUT OF COLONOSCOPY; HAS ORDER BDS  Additional Screening:  Vision Screening: Recommended annual ophthalmology exams for early detection of glaucoma and other disorders of the eye. Would you like a referral to an eye doctor? No    Dental Screening: Recommended annual dental exams for proper oral hygiene  Community Resource Referral / Chronic Care Management: CRR required this visit?  No   CCM required this visit?  No   Plan:    I have personally reviewed and noted the following in the patient's chart:   Medical and social history Use of alcohol, tobacco or illicit drugs  Current medications and supplements including opioid prescriptions. Patient is not currently taking opioid prescriptions. Functional ability and status Nutritional status Physical activity Advanced directives List of other physicians Hospitalizations, surgeries, and ER visits in previous 12 months Vitals Screenings to include cognitive, depression, and falls Referrals and appointments  In addition, I have reviewed and discussed with patient certain preventive protocols, quality metrics, and best practice recommendations. A written personalized care plan for preventive services as well as general preventive health recommendations were provided to patient.   Jhonnie GORMAN Das,  LPN   [id number]/[id number]   After Visit Summary: (MyChart) Due to this being a telephonic visit, the after visit summary with patients personalized plan was offered to patient via MyChart   Notes: HAS ORDER FOR MAMMOGRAM & BDS

## 2023-12-31 ENCOUNTER — Encounter: Payer: Self-pay | Admitting: Family Medicine

## 2023-12-31 NOTE — Assessment & Plan Note (Addendum)
 Managed by nephrology, due for labs in 2 days Chronic kidney disease stage 3b Estimated GFR at 39 mL/min/1.73 m. Spironolactone added for swelling per pt. Discussed potential SGLT2 inhibitors for kidney protection if proteinuria increases can ask nephro and may be a good option if prediabetes worsens or she develops type 2 diabetes - Follow up with nephrology for kidney function and electrolyte monitoring.

## 2023-12-31 NOTE — Assessment & Plan Note (Signed)
 On supplements Last vitamin D  Lab Results  Component Value Date   VD25OH 4 06/08/2023   Last labs were at goal Frequently being addressed or checked by nephrology

## 2023-12-31 NOTE — Assessment & Plan Note (Signed)
 Obesity with BMI 35 with multiple associated comorbidities including prediabetes, hyperlipidemia, hypertension, CKD stage IIIb Patient continues to work on diet and lifestyle efforts Wt Readings from Last 5 Encounters:  12/14/23 187 lb (84.8 kg)  09/09/23 187 lb (84.8 kg)  09/03/23 184 lb 12.8 oz (83.8 kg)  06/08/23 184 lb (83.5 kg)  02/25/23 182 lb (82.6 kg)   BMI Readings from Last 5 Encounters:  12/14/23 35.33 kg/m  09/09/23 35.33 kg/m  09/03/23 34.92 kg/m  06/08/23 34.77 kg/m  02/25/23 34.39 kg/m

## 2023-12-31 NOTE — Assessment & Plan Note (Signed)
 Managed by allopurinol  200 mg daily, prior uric acid labs were greater than 6.0 but she is not having gout flares Anticipate uric acid level will be done by nephrology

## 2023-12-31 NOTE — Assessment & Plan Note (Signed)
 Patient is having worse restless leg symptoms with a history of anemia of chronic disease, lower extremity edema and B12 deficiency She does not want to labs here today consider asking nephrology if they are able to add on this lab or she can return for labs Goal B12 >400

## 2023-12-31 NOTE — Assessment & Plan Note (Signed)
 Symptoms are currently well-controlled with over-the-counter medications -omeprazole 

## 2023-12-31 NOTE — Assessment & Plan Note (Addendum)
 Pre-diabetes A1c at 6.0% indicates pre-diabetic range. Discussed potential SGLT2 inhibitors for kidney protection and blood sugar control if needed. - Perform A1c test using finger stick method. - Monitor A1c every 6 to 12 months. Continue to monitor A1c and manage with diet/lifestyle efforts

## 2024-03-05 ENCOUNTER — Other Ambulatory Visit: Payer: Self-pay | Admitting: Family Medicine

## 2024-03-07 NOTE — Telephone Encounter (Signed)
 Requested Prescriptions  Pending Prescriptions Disp Refills   fluticasone  (FLONASE ) 50 MCG/ACT nasal spray [Pharmacy Med Name: FLUTICASONE  PROP 50 MCG SPRAY] 48 mL 2    Sig: SPRAY 2 SPRAYS INTO EACH NOSTRIL EVERY DAY     Ear, Nose, and Throat: Nasal Preparations - Corticosteroids Passed - 03/07/2024  3:29 PM      Passed - Valid encounter within last 12 months    Recent Outpatient Visits           2 months ago Benign essential HTN   [facility] Leavy Mole, PA-C   6 months ago Lumbar strain, initial encounter   [facility] Bernardo Fend, DO   9 months ago Benign essential HTN   [facility] Leavy Mole, PA-C       Future Appointments             In 3 months Leavy Mole, PA-C [facility] [place]

## 2024-03-23 ENCOUNTER — Ambulatory Visit
Admission: RE | Admit: 2024-03-23 | Discharge: 2024-03-23 | Disposition: A | Discharge status: Home / Self Care | Source: Ambulatory Visit | Attending: Family Medicine | Admitting: Family Medicine

## 2024-03-23 DIAGNOSIS — Z1231 Encounter for screening mammogram for malignant neoplasm of breast: Secondary | ICD-10-CM | POA: Insufficient documentation

## 2024-03-23 DIAGNOSIS — Z780 Asymptomatic menopausal state: Secondary | ICD-10-CM | POA: Diagnosis present

## 2024-03-23 DIAGNOSIS — Z13820 Encounter for screening for osteoporosis: Secondary | ICD-10-CM | POA: Insufficient documentation

## 2024-03-24 ENCOUNTER — Ambulatory Visit: Payer: Self-pay | Admitting: Family Medicine

## 2024-06-15 ENCOUNTER — Ambulatory Visit: Admitting: Family Medicine

## 2024-12-30 ENCOUNTER — Ambulatory Visit

## 2025-01-06 ENCOUNTER — Ambulatory Visit
